# Patient Record
Sex: Female | Born: 1955 | Race: White | Hispanic: No | Smoking: Former smoker
Health system: Southern US, Community
[De-identification: ages and names within clinical notes are randomized; demographics above are authoritative.]

## PROBLEM LIST (undated history)

## (undated) DIAGNOSIS — K219 Gastro-esophageal reflux disease without esophagitis: Secondary | ICD-10-CM

## (undated) DIAGNOSIS — I10 Essential (primary) hypertension: Secondary | ICD-10-CM

## (undated) DIAGNOSIS — I2510 Atherosclerotic heart disease of native coronary artery without angina pectoris: Principal | ICD-10-CM

## (undated) DIAGNOSIS — G4700 Insomnia, unspecified: Secondary | ICD-10-CM

## (undated) DIAGNOSIS — E785 Hyperlipidemia, unspecified: Secondary | ICD-10-CM

## (undated) DIAGNOSIS — I252 Old myocardial infarction: Secondary | ICD-10-CM

## (undated) DIAGNOSIS — Z9861 Coronary angioplasty status: Principal | ICD-10-CM

## (undated) HISTORY — DX: Hyperlipidemia, unspecified: E78.5

## (undated) HISTORY — DX: Essential (primary) hypertension: I10

## (undated) HISTORY — DX: Coronary angioplasty status: Z98.61

## (undated) HISTORY — DX: Gastro-esophageal reflux disease without esophagitis: K21.9

## (undated) HISTORY — DX: Insomnia, unspecified: G47.00

## (undated) HISTORY — DX: Old myocardial infarction: I25.2

## (undated) HISTORY — DX: Atherosclerotic heart disease of native coronary artery without angina pectoris: I25.10

---
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
## 2000-07-30 ENCOUNTER — Emergency Department (HOSPITAL_COMMUNITY): Admission: EM | Admit: 2000-07-30 | Discharge: 2000-07-31 | Payer: Self-pay | Admitting: Emergency Medicine

## 2003-09-03 DIAGNOSIS — I2510 Atherosclerotic heart disease of native coronary artery without angina pectoris: Secondary | ICD-10-CM

## 2003-09-03 HISTORY — DX: Atherosclerotic heart disease of native coronary artery without angina pectoris: I25.10

## 2003-09-03 HISTORY — PX: CORONARY ANGIOPLASTY WITH STENT PLACEMENT: SHX49

## 2004-07-28 ENCOUNTER — Inpatient Hospital Stay (HOSPITAL_COMMUNITY): Admission: EM | Admit: 2004-07-28 | Discharge: 2004-08-01 | Payer: Self-pay | Admitting: Emergency Medicine

## 2004-08-14 DIAGNOSIS — I252 Old myocardial infarction: Secondary | ICD-10-CM

## 2004-08-14 HISTORY — DX: Old myocardial infarction: I25.2

## 2012-09-25 ENCOUNTER — Other Ambulatory Visit (HOSPITAL_COMMUNITY): Payer: Self-pay | Admitting: Cardiology

## 2012-09-25 DIAGNOSIS — I10 Essential (primary) hypertension: Secondary | ICD-10-CM

## 2012-09-25 DIAGNOSIS — E782 Mixed hyperlipidemia: Secondary | ICD-10-CM

## 2012-09-25 DIAGNOSIS — I2510 Atherosclerotic heart disease of native coronary artery without angina pectoris: Secondary | ICD-10-CM

## 2012-09-25 DIAGNOSIS — I252 Old myocardial infarction: Secondary | ICD-10-CM

## 2012-10-03 HISTORY — PX: NM MYOVIEW LTD: HXRAD82

## 2012-10-06 ENCOUNTER — Ambulatory Visit (HOSPITAL_COMMUNITY)
Admission: RE | Admit: 2012-10-06 | Discharge: 2012-10-06 | Disposition: A | Discharge status: Home / Self Care | Payer: 59 | Source: Ambulatory Visit | Attending: Cardiology | Admitting: Cardiology

## 2012-10-06 DIAGNOSIS — I252 Old myocardial infarction: Secondary | ICD-10-CM

## 2012-10-06 DIAGNOSIS — I2510 Atherosclerotic heart disease of native coronary artery without angina pectoris: Secondary | ICD-10-CM

## 2012-10-06 DIAGNOSIS — E782 Mixed hyperlipidemia: Secondary | ICD-10-CM

## 2012-10-06 DIAGNOSIS — I10 Essential (primary) hypertension: Secondary | ICD-10-CM

## 2012-10-06 MED ORDER — TECHNETIUM TC 99M SESTAMIBI GENERIC - CARDIOLITE
10.4000 | Freq: Once | INTRAVENOUS | Status: AC | PRN
  Administered 2012-10-06: 10 via INTRAVENOUS

## 2012-10-06 MED ORDER — TECHNETIUM TC 99M SESTAMIBI GENERIC - CARDIOLITE
30.1000 | Freq: Once | INTRAVENOUS | Status: AC | PRN
  Administered 2012-10-06: 30.1 via INTRAVENOUS

## 2012-10-06 NOTE — Procedures (Addendum)
[facility] [address] [place] [address] [place] [address] [phone]  Cardiology Nuclear Med Study  Eileen Burgess is a 57 y.o. female     MRN : [id number]     DOB: Dec 04, 1955  Procedure Date: 10/06/2012  Nuclear Med Background Indication for Stress Test:  Stent Patency History:  MI 2005, CAD Cardiac Risk Factors: Family History - CAD, History of Smoking, Hypertension, Lipids and Obesity  Symptoms:  none   Nuclear Pre-Procedure Caffeine/Decaff Intake:  10:00pm NPO After: 8:30am   IV Site: R Antecubital  IV 0.9% NS with Angio Cath:  22g  Chest Size (in):  n/a IV Started by: Koren Shiver, CNMT  Height: 5\' 3"  (1.6 m)  Cup Size: C  BMI:  Body mass index is 35.07 kg/(m^2). Weight:  198 lb (89.812 kg)   Tech Comments:  Patient took Psychologist, clinical Med Study 1 or 2 day study: 1 day  Stress Test Type:  Stress  Order Authorizing Provider:  Bryan Lemma, MD   Resting Radionuclide: Technetium 30m Sestamibi  Resting Radionuclide Dose: 10.4 mCi   Stress Radionuclide:  Technetium 77m Sestamibi  Stress Radionuclide Dose: 30.1 mCi           Stress Protocol Rest HR: 60 Stress HR:139  Rest BP:125/88 Stress BP: 161/81  Exercise Time (min): 8:50 METS: 9.00          Dose of Adenosine (mg):  n/a Dose of Lexiscan: n/a mg  Dose of Atropine (mg): n/a Dose of Dobutamine: n/a mcg/kg/min (at max HR)  Stress Test Technologist: Ernestene Mention, CCT Nuclear Technologist: Gonzella Lex, CNMT   Rest Procedure:  Myocardial perfusion imaging was performed at rest 45 minutes following the intravenous administration of Technetium 40m Sestamibi. Stress Procedure:  The patient performed treadmill exercise using a Bruce  Protocol for 8 minutes and 50 seconds.. The patient stopped due to fatigue and shortness of breath. Patient denied any chest pain.  There were no significant ST-T wave changes.  Technetium 87m Sestamibi was injected at peak exercise and  myocardial perfusion imaging was performed after a brief delay.  Transient Ischemic Dilatation (Normal <1.22):  0.96 Lung/Heart Ratio (Normal <0.45):  0.35 QGS EDV:  67 ml QGS ESV:  22 ml LV Ejection Fraction: 68%  Signed by      Rest ECG: NSR - Normal EKG  Stress ECG: No significant change from baseline ECG  QPS Raw Data Images:  Normal; no motion artifact; normal heart/lung ratio. Stress Images:  Normal homogeneous uptake in all areas of the myocardium. Rest Images:  Normal homogeneous uptake with mild inferoseptal diaphragmatic attenuation. Subtraction (SDS):  Normal  Impression Exercise Capacity:  Good exercise capacity. BP Response:  Normal blood pressure response. Clinical Symptoms:  No significant symptoms noted. ECG Impression:  No significant ST segment change suggestive of ischemia. Comparison with Prior Nuclear Study: No significant change from previous study  Overall Impression:  Normal stress nuclear study. Low risk stress nuclear study.  LV Wall Motion:  NL LV Function, EF 68%; NL Wall Motion   Nicki Guadalajara A, MD  10/06/2012 3:21 PM

## 2013-03-23 ENCOUNTER — Encounter: Payer: Self-pay | Admitting: Cardiology

## 2013-03-23 ENCOUNTER — Ambulatory Visit (INDEPENDENT_AMBULATORY_CARE_PROVIDER_SITE_OTHER): Payer: BC Managed Care – PPO | Admitting: Cardiology

## 2013-03-23 VITALS — BP 138/98 | HR 66 | Ht 64.0 in | Wt 204.9 lb

## 2013-03-23 DIAGNOSIS — E669 Obesity, unspecified: Secondary | ICD-10-CM

## 2013-03-23 DIAGNOSIS — E785 Hyperlipidemia, unspecified: Secondary | ICD-10-CM | POA: Insufficient documentation

## 2013-03-23 DIAGNOSIS — Z9861 Coronary angioplasty status: Secondary | ICD-10-CM

## 2013-03-23 DIAGNOSIS — I10 Essential (primary) hypertension: Secondary | ICD-10-CM

## 2013-03-23 DIAGNOSIS — I2510 Atherosclerotic heart disease of native coronary artery without angina pectoris: Secondary | ICD-10-CM | POA: Insufficient documentation

## 2013-03-23 MED ORDER — HYDROCHLOROTHIAZIDE 12.5 MG PO CAPS: 12.5000 mg | ORAL_CAPSULE | Freq: Every day | ORAL | Status: DC

## 2013-03-23 NOTE — Patient Instructions (Addendum)
Don't get discouraged about the weight loss.  You will do fine.  Just keep up the efforts with exercise & watching your diet.  Your BP has started to creep up of late -- I am going to add a new medicine to take along with your Lisinopril called HCTZ.  I will be looking out for your cholesterol levels in a few months.  I would like for you to follow up here with one of my PAs or NP to check your blood pressure & follow up your weight loss efforts. I will see you in 1 yr.  Marykay Lex, MD  6 month

## 2013-03-23 NOTE — Progress Notes (Signed)
Patient ID: Eileen Burgess, female   DOB: Feb 13, 1956, 57 y.o.   MRN: [id number] PCP: No primary provider on file. [facility]  Clinic Note: Chief Complaint  Patient presents with  . 6 month visit    no chest pain ,no sob, no edema   HPI: Eileen Burgess is a 57 y.o. female with a PMH below who presents today for followup of her coronary disease and other cardiac risk factors. I urged saw her back in January of 2014, she been a long-term patient of Dr. Caprice Kluver with a history of the coronary disease and PCI to the LAD with a DES back in 2005.  Following for PCI, she only had a stress test in 2008. This is a Treadmill Myoview Stress Test, and she only walked 3 minutes and 40 seconds. There was no evidence of ischemia infarction, but that was only 5 metabolic equivalents. Since my last visit, she has had a repeat Treadmill Myoview Stress Test, and this time she performed much better almost making it to 9 minutes, and reaching 9 metabolic equivalents. She is quite happy with her performance in that affected that was again negative for ischemia or infarction. My last chart she had lost 8 pounds due to dietary modification and is opening up and exercise.  Interval History: She comes in today and is really not feeling any more short of breath at rest or exertion. Unfortunately she has put back on about 6 of the 8 pounds that she lost since her last visit despite being very active - she thinks she may have gotten a little lax on her diet. She denies any chest pain at rest or exertion. No PND, orthopnea or edema. No melena, hematochezia or hematuria. No TIA or amaurosis fugax symptoms. No lightheadedness, dizziness, wooziness, syncope/near syncope. No claudication symptoms.  Past Medical History  Diagnosis Date  . CAD S/P percutaneous coronary angioplasty 2005    PCI to pLAD; 3.0 mm x 38 mm Cypher DES; EF ~45% - distal, septal apical hypokinesia  . HTN (hypertension)   . Dyslipidemia, goal LDL  below 70   . GERD (gastroesophageal reflux disease)   . Insomnia    Prior Cardiac Evaluation and Past Surgical History: Past Surgical History  Procedure Laterality Date  . Coronary angioplasty with stent placement  2005    prox LAD 3.0 mm x 28 mm Cypher DES  . Nm myoview ltd  10/2012    8:50 min; 9 METS.  No ischemia or Infarction; EF ~68%    Allergies  Allergen Reactions  . Pravastatin Other (See Comments)    myalagia  . Shrimp (Shellfish Allergy)   . Vytorin (Ezetimibe-Simvastatin) Other (See Comments)    myalgia    Current Outpatient Prescriptions  Medication Sig Dispense Refill  . ALPRAZolam (NIRAVAM) 0.25 MG dissolvable tablet Take 0.25 mg by mouth at bedtime as needed for anxiety.      Marland Kitchen aspirin EC 81 MG tablet Take 81 mg by mouth daily.      Marland Kitchen lisinopril (PRINIVIL,ZESTRIL) 20 MG tablet Take 20 mg by mouth daily.      . Melatonin 3 MG TABS Take by mouth at bedtime.      . metoprolol succinate (TOPROL-XL) 25 MG 24 hr tablet Take 25 mg by mouth daily.      Marland Kitchen omeprazole (PRILOSEC) 20 MG capsule Take 20 mg by mouth daily.      . rosuvastatin (CRESTOR) 5 MG tablet Take 5 mg by mouth every other  day.      . hydrochlorothiazide (MICROZIDE) 12.5 MG capsule Take 1 capsule (12.5 mg total) by mouth daily.  90 capsule  4   No current facility-administered medications for this visit.  HCTZ Added @ this visit.  Social History Narrative   Widow.  Works for [employer].   Has been quite active, but no routine exercise.   Quit Smoking in 2005 before her MI.  Does not drink alcohol.   ROS: A comprehensive Review of Systems - Negative except inability to keep off her weight  PHYSICAL EXAM BP 138/98  Pulse 66  Ht 5\' 4"  (1.626 m)  Wt 204 lb 14.4 oz (92.942 kg)  BMI 35.15 kg/m2 General appearance: alert, cooperative, appears stated age, no distress and moderately obese Neck: no adenopathy, no carotid bruit, no JVD and supple, symmetrical, trachea midline Lungs: clear  to auscultation bilaterally, normal percussion bilaterally and Nonlabored, good air movement Heart: regular rate and rhythm, S1, S2 normal, no murmur, click, rub or gallop and normal apical impulse Abdomen: soft, non-tender; bowel sounds normal; no masses,  no organomegaly and Obese Extremities: extremities normal, atraumatic, no cyanosis or edema, no edema, redness or tenderness in the calves or thighs and no ulcers, gangrene or trophic changes Pulses: 2+ and symmetric Neurologic: Grossly normal  WUJ:[id number] today: Yes Rate: 66 , Rhythm:  Normal sinus rhythm, nonspecific ST-T changes  Recent Labs: none at present  ASSESSMENT: Overall stable cardiac standpoint. Continues to have difficulty with weight loss efforts. Blood pressure remains elevated with a diastolic pressure of 98 mmHg.  CAD S/P percutaneous coronary angioplasty Stable with no active symptoms. Recent stress test was negative for ischemia infarction. EF is back up to normal with no regional wall motion abnormalities noted on nuclear stress test.  Plan: Continue aspirin, current dose of beta blocker and ACE inhibitor as well as statin.  HTN (hypertension) Inadequately controlled diastolic pressure.  Plan: Add HCTZ 12.5 mg daily.-- Would titrate up to 25 mg if need be.  Dyslipidemia, goal LDL below 70 Currently taking Crestor 5 mg every other day. She is due to get lipids checked by her primary physician shortly.  If we don't have lab results at her next visit with the PA/NP, would check lipids and chemistry panel.  Obesity (BMI 30-39.9) We talked a lot about dietary modifications cutting back on carbohydrates and fatty foods. This in conjunction with continuing her working on exercise is the best option. For this reason I want to check back up with her in 6 months to look for blood pressure and to see if she needs any further guidance for weight loss. Her goal should be to lose 20 pounds in the next year, and if she does so  she'll be no longer obese.   PLAN: Per problem list. Orders Placed This Encounter  Procedures  . EKG 12-Lead   Meds ordered this encounter  Medications  . hydrochlorothiazide (MICROZIDE) 12.5 MG capsule    Sig: Take 1 capsule (12.5 mg total) by mouth daily.    Dispense:  90 capsule    Refill:  4    Followup:  6 months with NP/PA; 12 months with me  DAVID W. Herbie Baltimore, M.D., M.S. [facility] [address] [place], [place]  [address]  [phone] Pager # [phone]

## 2013-04-02 ENCOUNTER — Other Ambulatory Visit: Payer: Self-pay | Admitting: *Deleted

## 2013-04-03 ENCOUNTER — Encounter: Payer: Self-pay | Admitting: Cardiology

## 2013-04-03 DIAGNOSIS — E669 Obesity, unspecified: Secondary | ICD-10-CM | POA: Insufficient documentation

## 2013-04-03 NOTE — Assessment & Plan Note (Signed)
We talked a lot about dietary modifications cutting back on carbohydrates and fatty foods. This in conjunction with continuing her working on exercise is the best option. For this reason I want to check back up with her in 6 months to look for blood pressure and to see if she needs any further guidance for weight loss. Her goal should be to lose 20 pounds in the next year, and if she does so she'll be no longer obese.

## 2013-04-03 NOTE — Assessment & Plan Note (Addendum)
Inadequately controlled diastolic pressure.  Plan: Add HCTZ 12.5 mg daily.-- Would titrate up to 25 mg if need be.

## 2013-04-03 NOTE — Assessment & Plan Note (Signed)
Currently taking Crestor 5 mg every other day. She is due to get lipids checked by her primary physician shortly.  If we don't have lab results at her next visit with the PA/NP, would check lipids and chemistry panel.

## 2013-04-03 NOTE — Assessment & Plan Note (Signed)
Stable with no active symptoms. Recent stress test was negative for ischemia infarction. EF is back up to normal with no regional wall motion abnormalities noted on nuclear stress test.  Plan: Continue aspirin, current dose of beta blocker and ACE inhibitor as well as statin.

## 2013-04-06 ENCOUNTER — Other Ambulatory Visit: Payer: Self-pay | Admitting: *Deleted

## 2013-04-06 MED ORDER — ALPRAZOLAM 0.25 MG PO TBDP: 0.2500 mg | ORAL_TABLET | Freq: Every evening | ORAL | Status: DC | PRN

## 2013-04-06 NOTE — Telephone Encounter (Signed)
Xanax .25 mg    1 tab at bedtime or as directed  #60x3

## 2013-10-07 ENCOUNTER — Other Ambulatory Visit: Payer: Self-pay | Admitting: *Deleted

## 2013-10-07 ENCOUNTER — Other Ambulatory Visit: Payer: Self-pay | Admitting: Cardiology

## 2013-10-07 NOTE — Telephone Encounter (Signed)
Rx was sent to pharmacy electronically. 

## 2013-10-08 ENCOUNTER — Other Ambulatory Visit: Payer: Self-pay | Admitting: Cardiology

## 2013-10-11 NOTE — Telephone Encounter (Signed)
Rx was sent to pharmacy electronically. 

## 2014-01-06 ENCOUNTER — Telehealth: Payer: Self-pay | Admitting: Cardiology

## 2014-01-07 NOTE — Telephone Encounter (Signed)
Closed encounter °

## 2014-01-10 ENCOUNTER — Other Ambulatory Visit: Payer: Self-pay | Admitting: Cardiology

## 2014-01-11 NOTE — Telephone Encounter (Signed)
This is not a Rx that I can call in refills on without clinic visit.  ' Also - is best to defer this type of medication -- for long term care to the PCP.  [name], MD

## 2014-01-12 NOTE — Telephone Encounter (Signed)
Patient hasn't been seen since July 2014. Refill request deferred to patient's PCP.

## 2014-02-01 NOTE — Telephone Encounter (Signed)
That's fine.  His PCP can do it, but for a while (between PCPs) I did.  [name]

## 2014-02-09 ENCOUNTER — Encounter: Payer: Self-pay | Admitting: *Deleted

## 2014-02-10 ENCOUNTER — Encounter: Payer: Self-pay | Admitting: Cardiology

## 2014-02-15 ENCOUNTER — Encounter: Payer: Self-pay | Admitting: Cardiology

## 2014-02-15 ENCOUNTER — Ambulatory Visit (INDEPENDENT_AMBULATORY_CARE_PROVIDER_SITE_OTHER): Payer: BC Managed Care – PPO | Admitting: Cardiology

## 2014-02-15 VITALS — BP 110/72 | HR 63 | Ht 64.0 in | Wt 208.2 lb

## 2014-02-15 DIAGNOSIS — G4700 Insomnia, unspecified: Secondary | ICD-10-CM

## 2014-02-15 DIAGNOSIS — F411 Generalized anxiety disorder: Secondary | ICD-10-CM

## 2014-02-15 DIAGNOSIS — Z79899 Other long term (current) drug therapy: Secondary | ICD-10-CM

## 2014-02-15 DIAGNOSIS — Z789 Other specified health status: Secondary | ICD-10-CM

## 2014-02-15 DIAGNOSIS — I2510 Atherosclerotic heart disease of native coronary artery without angina pectoris: Secondary | ICD-10-CM

## 2014-02-15 DIAGNOSIS — I10 Essential (primary) hypertension: Secondary | ICD-10-CM

## 2014-02-15 DIAGNOSIS — E785 Hyperlipidemia, unspecified: Secondary | ICD-10-CM

## 2014-02-15 DIAGNOSIS — F419 Anxiety disorder, unspecified: Secondary | ICD-10-CM

## 2014-02-15 DIAGNOSIS — E669 Obesity, unspecified: Secondary | ICD-10-CM

## 2014-02-15 DIAGNOSIS — Z9861 Coronary angioplasty status: Secondary | ICD-10-CM

## 2014-02-15 MED ORDER — NITROGLYCERIN 0.4 MG SL SUBL: 0.4000 mg | SUBLINGUAL_TABLET | SUBLINGUAL | Status: DC | PRN

## 2014-02-15 MED ORDER — EZETIMIBE 10 MG PO TABS: 10.0000 mg | ORAL_TABLET | Freq: Every day | ORAL | Status: DC

## 2014-02-15 MED ORDER — ALPRAZOLAM 0.25 MG PO TABS: ORAL_TABLET | ORAL | Status: DC

## 2014-02-15 NOTE — Patient Instructions (Addendum)
LABS-LIPIDS, CMP  START Zetia 10 mg daily  Your physician wants you to follow-up in 6 months Dr Ellyn Hack.  You will receive a reminder letter in the mail two months in advance. If you don't receive a letter, please call our office to schedule the follow-up appointment.

## 2014-02-19 ENCOUNTER — Encounter: Payer: Self-pay | Admitting: Cardiology

## 2014-02-19 DIAGNOSIS — F419 Anxiety disorder, unspecified: Secondary | ICD-10-CM | POA: Insufficient documentation

## 2014-02-19 DIAGNOSIS — Z789 Other specified health status: Secondary | ICD-10-CM | POA: Insufficient documentation

## 2014-02-19 DIAGNOSIS — I10 Essential (primary) hypertension: Secondary | ICD-10-CM | POA: Insufficient documentation

## 2014-02-19 NOTE — Assessment & Plan Note (Signed)
Unfortunately, she did not take Crestor. We are now limited to using Zetia. Recheck lipids& CMP in ~ 3 months.

## 2014-02-19 NOTE — Assessment & Plan Note (Signed)
Well-controlled on her current regimen. 

## 2014-02-19 NOTE — Progress Notes (Signed)
Patient ID: Eileen Burgess, female   DOB: 1955/12/20, 58 y.o.   MRN: [id number] PCP: No PCP Per [facility]  Clinic Note: Chief Complaint  Patient presents with  . Annual Exam    NO CHEST PAIN , NO SOB, NO EDEMA   HPI: Eileen Burgess is a 58 y.o. female with a PMH below who presents today for followup of her coronary disease and other cardiac risk factors. I last saw her back in Aug 2014 as a 2nd visit since Dr. Eddie Dibbles retirement.   She has a PMH of CAD- PCI to LAD with DES in 2005 -- f/u TM Myoview in 10/2012 was negative for ischemia or infarction (9 Min ->9 METs), 68% EF  She was unable to tolerate Crestor, even with 1/wk dosing due to legs hurting.    Interval History:  She presents today with no major complaints.  She denies and anginal CP/pressure or dyspnea with rest or exertion.  She continues to battle with her weight -- having put on ~4 lb since last year -- essentially now up 2 pounds from her lowest weight in ~2 years.. No really very active -- no routine exercise.    No PND, orthopnea or edema. No melena, hematochezia or hematuria. No TIA or amaurosis fugax symptoms. No lightheadedness, dizziness, wooziness, syncope/near syncope. No claudication symptoms.No known telemetry unit he did have hematuria, epistaxis.  She says that even off of the statin, she has nighttime resting leg discomfort  Past Medical History  Diagnosis Date  . CAD S/P percutaneous coronary angioplasty 2005    PCI to pLAD; 3.0 mm x 38 mm Cypher DES; EF ~45% - distal, septal apical hypokinesia  . HTN (hypertension)   . Dyslipidemia, goal LDL below 70   . GERD (gastroesophageal reflux disease)   . Insomnia    Prior Cardiac Evaluation and Past Surgical History: Procedure Laterality Date  . Coronary angioplasty with stent placement  2005    prox LAD 3.0 mm x 28 mm Cypher DES  . Nm myoview ltd  10/2012    8:50 min; 9 METS.  No ischemia or Infarction; EF ~68%    Allergies  Allergen  Reactions  . Crestor [Rosuvastatin] Other (See Comments)    LEGS HURTING  . Pravastatin Other (See Comments)    myalagia  . Shrimp [Shellfish Allergy]   . Vytorin [Ezetimibe-Simvastatin] Other (See Comments)    myalgia    Current Outpatient Prescriptions  Medication Sig Dispense Refill  . aspirin EC 81 MG tablet Take 81 mg by mouth daily.      . hydrochlorothiazide (MICROZIDE) 12.5 MG capsule Take 1 capsule (12.5 mg total) by mouth daily.  90 capsule  4  . lisinopril (PRINIVIL,ZESTRIL) 20 MG tablet TAKE 1 TABLET BY MOUTH DAILY  90 tablet  1  . Melatonin 3 MG TABS Take by mouth at bedtime.      . metoprolol succinate (TOPROL-XL) 25 MG 24 hr tablet TAKE 1 TABLET BY MOUTH DAILY  30 tablet  5  . omeprazole (PRILOSEC) 20 MG capsule Take 20 mg by mouth daily.      Marland Kitchen PREVIDENT 5000 ENAMEL PROTECT 1.1-5 % PSTE       . ALPRAZolam (XANAX) 0.25 MG tablet TAKE 1 TO 2  TABLET DAILY AS NEEDED FOR ANXIETY AND, SLEEP  60 tablet  2  . nitroGLYCERIN (NITROSTAT) 0.4 MG SL tablet Place 1 tablet (0.4 mg total) under the tongue every 5 (five) minutes as needed for  chest pain.  25 tablet  3   No current facility-administered medications for this visit.  HCTZ Added @ this visit.  Social History Narrative   Widow.  Works for [employer].   Has been quite active, but no routine exercise.   Quit Smoking in 2005 before her MI.  Does not drink alcohol.   ROS: A comprehensive Review of Systems - Negative except inability to keep off her weight; still has intermittent anxiety & insomnia.  Still has 1-2 of the alprazolam tablets prescribed (with intent of ~1-2 months supply) which would suggest her psychological function is stable.  PHYSICAL EXAM BP 110/72  Pulse 63  Ht 5\' 4"  (1.626 m)  Wt 208 lb 3.2 oz (94.439 kg)  BMI 35.72 kg/m2 General appearance: alert, cooperative, appears stated age, no distress and moderately obese Neck: no adenopathy, no carotid bruit, no JVD and supple, symmetrical,  trachea midline Lungs: clear to auscultation bilaterally, normal percussion bilaterally and Nonlabored, good air movement Heart: regular rate and rhythm, S1, S2 normal, no murmur, click, rub or gallop and normal apical impulse Abdomen: soft, non-tender; bowel sounds normal; no masses,  no organomegaly and Obese Extremities: extremities normal, atraumatic, no cyanosis or edema, no edema, redness or tenderness in the calves or thighs and no ulcers, gangrene or trophic changes Pulses: 2+ and symmetric Neurologic: Grossly normal  [id number] today: Yes Rate: 63 , Rhythm:  Normal sinus rhythm, Sinus Arrythmia; nonspecific ST-T changes  Recent Labs: none at present  ASSESSMENT: Overall stable cardiac standpoint. Continues to have difficulty with weight loss efforts. Blood pressure more stable  Due for lipid evaluation.   CAD S/P percutaneous coronary angioplasty Stable with no active symptoms. Remains on beta blocker, ACE inhibitor and aspirin. No longer on Plavix. She is not on statin due to the statin intolerance.  HTN (hypertension) Well-controlled on her current regimen  Dyslipidemia, goal LDL below 70 Unfortunately, she did not take Crestor. We are now limited to using Zetia. Recheck lipids& CMP in ~ 3 months.  Statin intolerance Will have to use Zetia for now, perhaps we can try the new PCSK 9 inhibitor medications when they finally are on the market.  Obesity (BMI 30-39.9) This is really her Achilles heel. He needs it the stretching exercises and walking in a day to burn calories he is in change her diet. I suggested that she could possibly see a nutritionist, because she appears not able to do it on her own. I think if she is notable to lose weight between myself and her PCP, she may need a nutrition consult also minus exercise rehabilitation program  Anxiety - very rare "panick attacks" Very rare panic attacks, the prescription of alprazolam I gave her last year he still not  complete. Again finally given her a lot of prescription for Xanax and draped in a year. Did recommend she set a visit with a new primary care physician since she currently does not have a PCP.   PLAN: Per problem list. Orders Placed This Encounter  Procedures  . Lipid panel    Order Specific Question:  Has the patient fasted?    Answer:  Yes  . Comprehensive metabolic panel    Order Specific Question:  Has the patient fasted?    Answer:  Yes  . EKG 12-Lead   Meds ordered this encounter    Followup:  6 with me  DAVID W. Ellyn Hack, M.D., M.S. [facility] [address]  [place], [place]  [address]  [phone] Pager # [phone]

## 2014-02-19 NOTE — Assessment & Plan Note (Signed)
This is really her Achilles heel. He needs it the stretching exercises and walking in a day to burn calories he is in change her diet. I suggested that she could possibly see a nutritionist, because she appears not able to do it on her own. I think if she is notable to lose weight between myself and her PCP, she may need a nutrition consult also minus exercise rehabilitation program

## 2014-02-19 NOTE — Assessment & Plan Note (Signed)
Stable with no active symptoms. Remains on beta blocker, ACE inhibitor and aspirin. No longer on Plavix. She is not on statin due to the statin intolerance.

## 2014-02-19 NOTE — Assessment & Plan Note (Signed)
Very rare panic attacks, the prescription of alprazolam I gave her last year he still not complete. Again finally given her a lot of prescription for Xanax and draped in a year. Did recommend she set a visit with a new primary care physician since she currently does not have a PCP.

## 2014-02-19 NOTE — Assessment & Plan Note (Signed)
Will have to use Zetia for now, perhaps we can try the new PCSK 9 inhibitor medications when they finally are on the market.

## 2014-03-24 ENCOUNTER — Other Ambulatory Visit: Payer: Self-pay | Admitting: Cardiology

## 2014-03-24 NOTE — Telephone Encounter (Signed)
Rx was sent to pharmacy electronically. 

## 2014-04-06 ENCOUNTER — Other Ambulatory Visit: Payer: Self-pay | Admitting: Cardiology

## 2014-04-06 NOTE — Telephone Encounter (Signed)
Rx was sent to pharmacy electronically. 

## 2014-04-12 ENCOUNTER — Other Ambulatory Visit: Payer: Self-pay | Admitting: Cardiology

## 2014-04-12 NOTE — Telephone Encounter (Signed)
Rx was sent to pharmacy electronically. 

## 2014-05-26 ENCOUNTER — Telehealth: Payer: Self-pay | Admitting: *Deleted

## 2014-05-26 NOTE — Telephone Encounter (Signed)
Vaccine administration record for the influenza received  RN placed information in immunization history chart

## 2014-09-05 ENCOUNTER — Other Ambulatory Visit: Payer: Self-pay | Admitting: Cardiology

## 2014-09-06 NOTE — Telephone Encounter (Signed)
Unfortunately - this Rx needs a clinic visit - controlled substance.  I was hoping that a PCP would take over this Rx.   Can see me or APP, but cannot fill without having a clinic visit.  [name], MD

## 2014-09-08 ENCOUNTER — Telehealth: Payer: Self-pay | Admitting: Cardiology

## 2014-09-08 DIAGNOSIS — G4700 Insomnia, unspecified: Secondary | ICD-10-CM

## 2014-09-08 DIAGNOSIS — F419 Anxiety disorder, unspecified: Secondary | ICD-10-CM

## 2014-09-08 MED ORDER — ALPRAZOLAM 0.25 MG PO TABS: ORAL_TABLET | ORAL | Status: DC

## 2014-09-08 NOTE — Telephone Encounter (Signed)
Xanax refill refused with "instructions to pharmacy" that patient needs office visit for refills

## 2014-09-08 NOTE — Telephone Encounter (Signed)
Mrs.Troxler is calling because her prescription Alprazolam 0.25 was denied because she needed to schedule an appointment . She has an appointment for 10/12/14. Please send to [place] .Marland Kitchen Any questions please call . Thanks

## 2014-09-08 NOTE — Telephone Encounter (Signed)
Med refilled, called to pharmacy.

## 2014-09-08 NOTE — Addendum Note (Signed)
Addended by: Theodore Demark on: 09/08/2014 02:49 PM   Modules accepted: Orders

## 2014-10-12 ENCOUNTER — Encounter: Payer: Self-pay | Admitting: Cardiology

## 2014-10-12 ENCOUNTER — Ambulatory Visit (INDEPENDENT_AMBULATORY_CARE_PROVIDER_SITE_OTHER): Payer: 59 | Admitting: Cardiology

## 2014-10-12 VITALS — BP 122/70 | HR 51 | Ht 64.0 in | Wt 207.7 lb

## 2014-10-12 DIAGNOSIS — I252 Old myocardial infarction: Secondary | ICD-10-CM

## 2014-10-12 DIAGNOSIS — I2510 Atherosclerotic heart disease of native coronary artery without angina pectoris: Secondary | ICD-10-CM

## 2014-10-12 DIAGNOSIS — Z789 Other specified health status: Secondary | ICD-10-CM

## 2014-10-12 DIAGNOSIS — E669 Obesity, unspecified: Secondary | ICD-10-CM

## 2014-10-12 DIAGNOSIS — Z79899 Other long term (current) drug therapy: Secondary | ICD-10-CM

## 2014-10-12 DIAGNOSIS — E785 Hyperlipidemia, unspecified: Secondary | ICD-10-CM

## 2014-10-12 DIAGNOSIS — Z9861 Coronary angioplasty status: Secondary | ICD-10-CM

## 2014-10-12 DIAGNOSIS — F419 Anxiety disorder, unspecified: Secondary | ICD-10-CM

## 2014-10-12 DIAGNOSIS — G4700 Insomnia, unspecified: Secondary | ICD-10-CM

## 2014-10-12 DIAGNOSIS — I10 Essential (primary) hypertension: Secondary | ICD-10-CM

## 2014-10-12 DIAGNOSIS — Z889 Allergy status to unspecified drugs, medicaments and biological substances status: Secondary | ICD-10-CM

## 2014-10-12 LAB — LIPID PANEL
Cholesterol: 232 mg/dL — ABNORMAL HIGH (ref 0–200)
HDL: 37 mg/dL — ABNORMAL LOW (ref >39)
LDL CALC: 173 mg/dL — AB (ref 0–99)
Total CHOL/HDL Ratio: 6.3 Ratio
Triglycerides: 111 mg/dL (ref <150)
VLDL: 22 mg/dL (ref 0–40)

## 2014-10-12 LAB — HEPATIC FUNCTION PANEL
ALT: 8 U/L (ref 0–35)
AST: 12 U/L (ref 0–37)
Albumin: 4 g/dL (ref 3.5–5.2)
Alkaline Phosphatase: 72 U/L (ref 39–117)
Bilirubin, Direct: 0.1 mg/dL (ref 0.0–0.3)
Indirect Bilirubin: 0.4 mg/dL (ref 0.2–1.2)
Total Bilirubin: 0.5 mg/dL (ref 0.2–1.2)
Total Protein: 7 g/dL (ref 6.0–8.3)

## 2014-10-12 MED ORDER — ALPRAZOLAM 0.25 MG PO TABS: ORAL_TABLET | ORAL | Status: DC

## 2014-10-12 MED ORDER — FENOFIBRATE 48 MG PO TABS: 48.0000 mg | ORAL_TABLET | Freq: Every day | ORAL | Status: DC

## 2014-10-12 MED ORDER — FENOFIBRATE 54 MG PO TABS: 54.0000 mg | ORAL_TABLET | Freq: Every day | ORAL | Status: DC

## 2014-10-12 NOTE — Patient Instructions (Addendum)
Dr Ellyn Hack has ordered the following test(s) to be done: 1. Blood work - to be done today   Your physician has recommended making the following medication changes: START Fenofibrate 48 mg - take 1 tablet. START Co-Q 10 100 mg - take 100 mg for 1 week then increase to 200 mg for 1 week then increase to 300 mg and remain at that dose.   Dr Ellyn Hack has referred you to Nehemiah Massed, PharmD in the lipid clinic.  Dr Ellyn Hack wants you to follow-up in 6 months. You will receive a reminder letter in the mail one months in advance. If you don't receive a letter, please call our office to schedule the follow-up appointment.

## 2014-10-14 ENCOUNTER — Encounter: Payer: Self-pay | Admitting: Cardiology

## 2014-10-14 NOTE — Assessment & Plan Note (Signed)
Refer for PCSK-9 Inhibitor.

## 2014-10-14 NOTE — Assessment & Plan Note (Signed)
Anterior STEMI in 2005.  No evidence of anterior infarct on Myoview with preserved EF.

## 2014-10-14 NOTE — Assessment & Plan Note (Signed)
The patient understands the need to lose weight with diet and exercise. We have discussed specific strategies for this. She needs to figure out a way to get her sone & Daughter-in-law to get with the program of healthy eating. Also needs to make time for exercise.

## 2014-10-14 NOTE — Progress Notes (Signed)
Patient ID: Eileen Burgess, female   DOB: 1956-01-06, 59 y.o.   MRN: [id number] PCP: No PCP Per Patient [facility]  Clinic Note: Chief Complaint  Patient presents with  . 6 MONTH VISIT  . Coronary Artery Disease    LAD PCI in 2005   HPI: Eileen Burgess is a 59 y.o. female with a PMH below who presents today for 6 month followup of her coronary disease and other cardiac risk factors.  Former Dr. Rex Kras Patient.  She was unable to tolerate Crestor, even with 1/wk dosing due to legs hurting.   She has not yet set up our care physician.  Interval History:  She presents today with no major complaints.  She denies and anginal CP/pressure or dyspnea with rest or exertion.  She continues to battle with her weight -- but at least remained somewhat stable from last year. He said that she really hasn't been following a strict diet she probably should be able to. Part of the reason is because she has to split the cost of food with her son and daughter-in-law who cannot have good eating habits. Overall, she continues to be active at work, but does not have any routine exercise.   She denies any particular symptoms of PND, orthopnea or edema. No melena, hematochezia or hematuria. No TIA or amaurosis fugax symptoms. No lightheadedness, dizziness, wooziness, syncope/near syncope. No claudication symptoms.No known telemetry unit he did have hematuria, epistaxis.  She says that even off of the statin, she has nighttime resting leg discomfort  Past Medical History  Diagnosis Date  . CAD S/P percutaneous coronary angioplasty 2005    PCI to pLAD 99%; 3.0 mm x 38 mm Cypher DES; EF ~45% - distal, septal apical hypokinesis    . 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 Dyslipidemia, goal LDL below 70   . Essential hypertension   . GERD (gastroesophageal reflux disease)   . Insomnia    Prior Cardiac Evaluation and Past Surgical History: Past Surgical History  Procedure Laterality Date    . Coronary angioplasty with stent placement  2005    prox LAD 3.0 mm x 28 mm Cypher DES; circumflex is nondominant with 2 large EOMs. Dominant RCA is a large PDA and posterolateral branch.  . Nm myoview ltd  10/2012    8:50 min; 9 METS.  No ischemia or Infarction; EF ~68%    Allergies  Allergen Reactions  . Crestor [Rosuvastatin] Other (See Comments)    LEGS HURTING  . Pravastatin Other (See Comments)    myalagia  . Shrimp [Shellfish Allergy]   . Vytorin [Ezetimibe-Simvastatin] Other (See Comments)    myalgia   Current Outpatient Prescriptions on File Prior to Visit  Medication Sig Dispense Refill  . aspirin EC 81 MG tablet Take 81 mg by mouth daily.    . hydrochlorothiazide (MICROZIDE) 12.5 MG capsule TAKE 1 CAPSULE BY MOUTH DAILY 90 capsule 3  . lisinopril (PRINIVIL,ZESTRIL) 20 MG tablet TAKE 1 TABLET BY MOUTH ONCE DAILY 90 tablet 3  . Melatonin 3 MG TABS Take by mouth at bedtime.    . metoprolol succinate (TOPROL-XL) 25 MG 24 hr tablet TAKE 1 TABLET BY MOUTH DAILY 30 tablet 9  . nitroGLYCERIN (NITROSTAT) 0.4 MG SL tablet Place 1 tablet (0.4 mg total) under the tongue every 5 (five) minutes as needed for chest pain. 25 tablet 3  . omeprazole (PRILOSEC) 20 MG capsule Take 20 mg by mouth daily.    Marland Kitchen  PREVIDENT 5000 ENAMEL PROTECT 1.1-5 % PSTE      No current facility-administered medications on file prior to visit.   Social History Narrative   Widow.  Works for [employer].   Has been quite active, but no routine exercise.   Quit Smoking in 2005 before her MI.  Does not drink alcohol.   ROS: A comprehensive Review of Systems - was performed Review of Systems  Constitutional: Negative for weight loss (Just can't seem to loose) and malaise/fatigue.  HENT: Positive for nosebleeds.   Respiratory: Negative for shortness of breath.   Cardiovascular: Negative for claudication.  Gastrointestinal: Positive for heartburn. Negative for blood in stool and melena.   Genitourinary: Negative for hematuria.  Musculoskeletal: Positive for myalgias (Better off of Crestor).  Neurological: Negative for dizziness.  Endo/Heme/Allergies: Does not bruise/bleed easily.  Psychiatric/Behavioral: The patient is nervous/anxious (Usually takes 1/2 to 1 of her PRN Alprozolam / day) and has insomnia (not too often - but sometimes stress "gets the best of her").   All other systems reviewed and are negative.  PHYSICAL EXAM BP 122/70 mmHg  Pulse 51  Ht 5\' 4"  (1.626 m)  Wt 207 lb 11.2 oz (94.212 kg)  BMI 35.63 kg/m2 General appearance: alert, cooperative, appears stated age, no distress and moderately obese Neck: no adenopathy, no carotid bruit, no JVD and supple, symmetrical, trachea midline Lungs: clear to auscultation bilaterally, normal percussion bilaterally and Nonlabored, good air movement Heart: regular rate and rhythm, S1, S2 normal, no murmur, click, rub or gallop and normal apical impulse Abdomen: soft, non-tender; bowel sounds normal; no masses,  no organomegaly and Obese Extremities: extremities normal, atraumatic, no cyanosis or edema, no edema, redness or tenderness in the calves or thighs and no ulcers, gangrene or trophic changes Pulses: 2+ and symmetric Neurologic: Grossly normal  [id number] today: Yes Rate: 101, Rhythm:  Sinus Tachycardia with 1AVB & PACs - in an almost Atrial Bigeminy pattern,; nonspecific ST-T changes & minimal LVH criteria; TWI in III is new as is Incomplete RBBB  Recent Labs: none at present  ASSESSMENT / PLAN: Overall stable cardiac standpoint. Continues to have difficulty with weight loss efforts. Blood pressure more stable  Due for lipid evaluation.    History of acute anterior wall MI Anterior STEMI in 2005.  No evidence of anterior infarct on Myoview with preserved EF.   CAD S/P percutaneous coronary angioplasty She has Cypher DES stent in her LAD. It is relatively large stent. She remains on aspirin, beta blocker  and ACE inhibitor. She is statin intolerant.  No active anginal symptoms. At just over 10 years for PCI, thicker Cypher stent is probably healed, however first generation DES stents have larger stent-platforms and need to be monitored closely.   Dyslipidemia, goal LDL below 70 Intolerant of Crestor. She apparently he tried Zetia in the past as well.  We are pretty much at the end of statin options besides a Livalo. Plan: Start Low-Dose Fenofibrate & CoQ 10.   Check labs today.   Refer to Tommy Medal, RPH-CCP for consideration of PCSK-9 Inhibitor Rx.   Essential hypertension Excellent control. Continue Current meds.   Obesity (BMI 30-39.9) The patient understands the need to lose weight with diet and exercise. We have discussed specific strategies for this. She needs to figure out a way to get her sone & Daughter-in-law to get with the program of healthy eating. Also needs to make time for exercise.   Statin intolerance Refer for PCSK-9 Inhibitor.  Anxiety - very rare "panick attacks" Rare panic attacks & insomnia -- since she still does not have a PCP - will refill Alprazolam.     Orders Placed This Encounter  Procedures  . Hepatic function panel  . Lipid panel  . EKG 12-Lead   Meds ordered this encounter    Followup:  6 with me  [name], M.D., M.S. Interventional Cardiologist   Pager # [phone]

## 2014-10-14 NOTE — Assessment & Plan Note (Signed)
Rare panic attacks & insomnia -- since she still does not have a PCP - will refill Alprazolam.

## 2014-10-14 NOTE — Assessment & Plan Note (Signed)
She has Cypher DES stent in her LAD. It is relatively large stent. She remains on aspirin, beta blocker and ACE inhibitor. She is statin intolerant.  No active anginal symptoms. At just over 10 years for PCI, thicker Cypher stent is probably healed, however first generation DES stents have larger stent-platforms and need to be monitored closely.

## 2014-10-14 NOTE — Assessment & Plan Note (Signed)
Intolerant of Crestor. She apparently he tried Zetia in the past as well.  We are pretty much at the end of statin options besides a Livalo. Plan: Start Low-Dose Fenofibrate & CoQ 10.   Check labs today.   Refer to Tommy Medal, RPH-CCP for consideration of PCSK-9 Inhibitor Rx.

## 2014-10-14 NOTE — Assessment & Plan Note (Signed)
Excellent control. Continue Current meds.

## 2014-10-19 ENCOUNTER — Telehealth: Payer: Self-pay | Admitting: *Deleted

## 2014-10-19 DIAGNOSIS — Z789 Other specified health status: Secondary | ICD-10-CM

## 2014-10-19 DIAGNOSIS — Z79899 Other long term (current) drug therapy: Secondary | ICD-10-CM

## 2014-10-19 DIAGNOSIS — E669 Obesity, unspecified: Secondary | ICD-10-CM

## 2014-10-19 DIAGNOSIS — E785 Hyperlipidemia, unspecified: Secondary | ICD-10-CM

## 2014-10-19 NOTE — Telephone Encounter (Signed)
-----   Message from [name], MD sent at 10/14/2014  5:32 AM EST ----- Cholesterol Panel is not at goal as expected. Goal Cholesterol is <<200, LDL goal < 70 => we have a long way to go.   Will be reassessing in 3 months. Will forward to Tommy Medal, RPH-CCP ( our pharmacist) to check into enrolling you for possible treatment with the newest treatment option for cholesterol levels.  [name], MD

## 2014-10-19 NOTE — Telephone Encounter (Signed)
LEFT MESSAGE FOR PATIENT TO CALL BACK LABS PLACED IN COMPUTER FOR 3 MONTHS ROUTED TO [place] -d

## 2014-10-20 NOTE — Telephone Encounter (Signed)
Spoke to patient. Result given . Verbalized understanding Patient states she has an appointment with Erasmo Downer set up already.

## 2014-10-20 NOTE — Telephone Encounter (Signed)
Returning your call. °

## 2014-10-21 ENCOUNTER — Ambulatory Visit: Payer: 59 | Admitting: Pharmacist Clinician (PhC)/ Clinical Pharmacy Specialist

## 2014-11-14 ENCOUNTER — Ambulatory Visit (INDEPENDENT_AMBULATORY_CARE_PROVIDER_SITE_OTHER): Payer: 59 | Admitting: Pharmacist Clinician (PhC)/ Clinical Pharmacy Specialist

## 2014-11-14 VITALS — Ht 64.0 in | Wt 211.0 lb

## 2014-11-14 DIAGNOSIS — E785 Hyperlipidemia, unspecified: Secondary | ICD-10-CM

## 2014-11-14 NOTE — Patient Instructions (Signed)
Try Zetia 5 mg (1/2 of 10 mg tablet) daily.  Call in 3-4 weeks and let us know how you are tolerating this.  Continue to work on dietary changes.    Try increasing your fiber by adding Metamucil Capsules 1-2 daily as you can tolerate.  Also try Cholestoff by Petra Kuba Made, 2 capsules daily  Repeat labs in mid-May

## 2014-11-15 ENCOUNTER — Encounter: Payer: Self-pay | Admitting: Pharmacist Clinician (PhC)/ Clinical Pharmacy Specialist

## 2014-11-15 MED ORDER — EZETIMIBE 10 MG PO TABS: 10.0000 mg | ORAL_TABLET | Freq: Every day | ORAL | Status: DC

## 2014-11-15 NOTE — Progress Notes (Signed)
11/15/2014 NEIVA MAENZA [birth year] [id number]   HPI:  Eileen Burgess is a 59 y.o. female patient of Dr Ellyn Hack, who presents today for a lipid clinic evaluation.  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 Range 10/12/2014 11:19  Cholesterol Latest Range: 0-200 mg/dL 232 (H)  Triglycerides Latest Range: <150 mg/dL 111  HDL Latest Range: >39 mg/dL 37 (L)  LDL (calc) Latest Range: 0-99 mg/dL 173 (H)  VLDL Latest Range: 0-40 mg/dL 22  Total CHOL/HDL Ratio Latest Units: Ratio 6.3     Current Outpatient Prescriptions  Medication Sig Dispense Refill  . ALPRAZolam (XANAX) 0.25 MG tablet TAKE 1 TO 2  TABLET DAILY AS NEEDED FOR ANXIETY AND, SLEEP 180 tablet 0  . aspirin EC 81 MG tablet Take 81 mg by mouth daily.    . Coenzyme Q10 (CO Q 10) 100 MG CAPS Take 1 capsule by mouth daily.    . fenofibrate 54 MG tablet Take 1 tablet (54 mg total) by mouth daily. 30 tablet 11  . hydrochlorothiazide (MICROZIDE) 12.5 MG capsule TAKE 1 CAPSULE BY MOUTH DAILY 90 capsule 3  . lisinopril (PRINIVIL,ZESTRIL) 20 MG tablet TAKE 1 TABLET BY MOUTH ONCE DAILY 90 tablet 3  . Melatonin 3 MG TABS Take by mouth at bedtime.    . metoprolol succinate (TOPROL-XL) 25 MG 24 hr  tablet TAKE 1 TABLET BY MOUTH DAILY 30 tablet 9  . nitroGLYCERIN (NITROSTAT) 0.4 MG SL tablet Place 1 tablet (0.4 mg total) under the tongue every 5 (five) minutes as needed for chest pain. 25 tablet 3  . omeprazole (PRILOSEC) 20 MG capsule Take 20 mg by mouth daily.    Marland Kitchen PREVIDENT 5000 ENAMEL PROTECT 1.1-5 % PSTE      No current facility-administered medications for this visit.    Allergies  Allergen Reactions  . Crestor [Rosuvastatin] Other (See Comments)    LEGS HURTING  . Pravastatin Other (See Comments)    myalagia  . Shrimp [Shellfish Allergy]   . Vytorin [Ezetimibe-Simvastatin] Other (See Comments)    myalgia    Past Medical History  Diagnosis Date  . CAD S/P percutaneous coronary angioplasty 2005    PCI to pLAD 99%; 3.0 mm x 38 mm Cypher DES; EF ~45% - distal, septal apical hypokinesis    . 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 Dyslipidemia, goal LDL below 70   . Essential hypertension   . GERD (gastroesophageal reflux disease)   . Insomnia     Height 5\' 4"  (1.626 m), weight 211 lb (95.709 kg).  Tommy Medal PharmD CPP [facility]

## 2014-11-15 NOTE — Assessment & Plan Note (Addendum)
Pt with previous good LDL cholesterol on vytorin 10/40 for several years.  Unfortunately patient developed myalgias over time and had to discontinue.  Future attempts with multiple statins brought about same cramping and weakness.  Pt had also taken niacin for some time, but complained of increased problems with flushing and finally stopped.   Records show she tried Zetia by itself in the past, but patient cannot recall if caused any problems.  Today will start Zetia at 5 mg (1/2 tablet) daily and increase to 1 tablet as tolerated, gave patient prescription for free 30 tabs.  Encouraged her to work on diet, increasing fiber naturally as well as considering adding Metamucil capsules daily.  Also encouraged she try Cholestoff, by Petra Kuba Made, 2 capsules daily.  Will repeat labs in May, then move forward.  She may need PCSK-9, however with her insurance deductibles copays, we will have to see how the medication prices.

## 2014-12-15 ENCOUNTER — Telehealth: Payer: Self-pay | Admitting: *Deleted

## 2014-12-15 DIAGNOSIS — E669 Obesity, unspecified: Secondary | ICD-10-CM

## 2014-12-15 DIAGNOSIS — Z789 Other specified health status: Secondary | ICD-10-CM

## 2014-12-15 DIAGNOSIS — Z79899 Other long term (current) drug therapy: Secondary | ICD-10-CM

## 2014-12-15 DIAGNOSIS — E785 Hyperlipidemia, unspecified: Secondary | ICD-10-CM

## 2014-12-15 NOTE — Telephone Encounter (Signed)
Mail letter and lab slip  FOR CMP AND LIPID DUE BEFORE 01/17/15

## 2014-12-15 NOTE — Telephone Encounter (Signed)
-----   Message from Raiford Simmonds, RN sent at 10/19/2014 11:06 AM EST ----- LIPID , CMP MAIL IN April 2016 SCHEDULE IN MAY 2016

## 2015-02-15 ENCOUNTER — Other Ambulatory Visit: Payer: Self-pay | Admitting: Cardiology

## 2015-02-15 NOTE — Telephone Encounter (Signed)
Rx(s) sent to pharmacy electronically.  

## 2015-02-28 LAB — LIPID PANEL
CHOLESTEROL: 210 mg/dL — AB (ref 0–200)
HDL: 45 mg/dL — ABNORMAL LOW (ref >=46)
LDL Cholesterol: 140 mg/dL — ABNORMAL HIGH (ref 0–99)
Total CHOL/HDL Ratio: 4.7 Ratio
Triglycerides: 125 mg/dL (ref <150)
VLDL: 25 mg/dL (ref 0–40)

## 2015-02-28 LAB — COMPREHENSIVE METABOLIC PANEL
ALBUMIN: 3.8 g/dL (ref 3.5–5.2)
ALT: 9 U/L (ref 0–35)
AST: 16 U/L (ref 0–37)
Alkaline Phosphatase: 57 U/L (ref 39–117)
BUN: 12 mg/dL (ref 6–23)
CO2: 27 meq/L (ref 19–32)
Calcium: 9.5 mg/dL (ref 8.4–10.5)
Chloride: 102 mEq/L (ref 96–112)
Creat: 1.27 mg/dL — ABNORMAL HIGH (ref 0.50–1.10)
GLUCOSE: 95 mg/dL (ref 70–99)
Potassium: 4.4 mEq/L (ref 3.5–5.3)
SODIUM: 139 meq/L (ref 135–145)
TOTAL PROTEIN: 6.8 g/dL (ref 6.0–8.3)
Total Bilirubin: 0.4 mg/dL (ref 0.2–1.2)

## 2015-03-14 NOTE — Telephone Encounter (Signed)
Quick Note:  Chem panel - kidney function suggests that you may be a bit dehydrated, increase water/fluid beverage intake. We will recheck on next set of labs.  Lipid panel definitely shows improvement all around, but still not @ goal. Continue Plan & f/u with Erasmo Downer.  [name], MD  ______

## 2015-03-30 ENCOUNTER — Ambulatory Visit: Payer: 59 | Admitting: Pharmacist Clinician (PhC)/ Clinical Pharmacy Specialist

## 2015-04-03 ENCOUNTER — Other Ambulatory Visit: Payer: Self-pay | Admitting: Cardiology

## 2015-04-03 NOTE — Telephone Encounter (Signed)
REFILL 

## 2015-04-12 ENCOUNTER — Other Ambulatory Visit: Payer: Self-pay | Admitting: Cardiology

## 2015-04-12 NOTE — Telephone Encounter (Signed)
Rx(s) sent to pharmacy electronically.  

## 2015-04-26 ENCOUNTER — Other Ambulatory Visit: Payer: Self-pay | Admitting: Nurse Practitioner

## 2015-04-26 DIAGNOSIS — Z1231 Encounter for screening mammogram for malignant neoplasm of breast: Secondary | ICD-10-CM

## 2015-04-27 ENCOUNTER — Ambulatory Visit (INDEPENDENT_AMBULATORY_CARE_PROVIDER_SITE_OTHER): Payer: 59 | Admitting: Pharmacist Clinician (PhC)/ Clinical Pharmacy Specialist

## 2015-04-27 ENCOUNTER — Encounter: Payer: Self-pay | Admitting: Pharmacist Clinician (PhC)/ Clinical Pharmacy Specialist

## 2015-04-27 VITALS — Ht 64.0 in | Wt 212.0 lb

## 2015-04-27 DIAGNOSIS — E785 Hyperlipidemia, unspecified: Secondary | ICD-10-CM | POA: Diagnosis not present

## 2015-04-27 NOTE — Patient Instructions (Signed)
Continue with the Zetia as you can tolerate.    Once you figure out your new insurance, please bring a copy of the card and we can start the process of getting you Praluent or Repatha.

## 2015-04-27 NOTE — Progress Notes (Signed)
04/27/2015 [name] [birth year] [id number]   HPI:  Eileen Burgess is a 59 y.o. female patient of Dr Ellyn Hack, who presents today for a lipid clinic follow up.  She has been taking Zetia for the past 5 months, but can only take for 4-5 days until muscle aches develop, then she stops for 3-4 days and resumes.  She isn't too bothered by taking it this way.   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 Range 10/12/2014 11:19 02/2015  Cholesterol Latest Range: 0-200 mg/dL 232 (H) 210  Triglycerides Latest Range: <150 mg/dL 111 125  HDL Latest Range: >39 mg/dL 37 (L) 45  LDL (calc) Latest Range: 0-99 mg/dL 173 (H) 140  VLDL Latest Range: 0-40 mg/dL 22   Total CHOL/HDL Ratio Latest Units: Ratio 6.3      Current Outpatient Prescriptions  Medication Sig Dispense Refill  . ALPRAZolam (XANAX) 0.25 MG tablet TAKE 1 TO 2  TABLET DAILY AS NEEDED FOR ANXIETY AND, SLEEP 180 tablet 0  . aspirin EC 81 MG tablet Take 81 mg by mouth daily.    . Coenzyme Q10 (CO Q 10) 100 MG CAPS Take 1 capsule by mouth daily.    Marland Kitchen ezetimibe (ZETIA) 10 MG tablet Take 1 tablet (10 mg total) by mouth daily. 30 tablet 2  . fenofibrate 54 MG tablet Take 1  tablet (54 mg total) by mouth daily. 30 tablet 11  . hydrochlorothiazide (MICROZIDE) 12.5 MG capsule TAKE 1 CAPSULE BY MOUTH DAILY 90 capsule 1  . lisinopril (PRINIVIL,ZESTRIL) 20 MG tablet TAKE 1 TABLET BY MOUTH DAILY 90 tablet 3  . Melatonin 3 MG TABS Take by mouth at bedtime.    . metoprolol succinate (TOPROL-XL) 25 MG 24 hr tablet TAKE 1 TABLET BY MOUTH ONCE DAILY 30 tablet 8  . nitroGLYCERIN (NITROSTAT) 0.4 MG SL tablet Place 1 tablet (0.4 mg total) under the tongue every 5 (five) minutes as needed for chest pain. 25 tablet 3  . omeprazole (PRILOSEC) 20 MG capsule Take 20 mg by mouth daily.    Marland Kitchen PREVIDENT 5000 ENAMEL PROTECT 1.1-5 % PSTE      No current facility-administered medications for this visit.    Allergies  Allergen Reactions  . Crestor [Rosuvastatin] Other (See Comments)    LEGS HURTING  . Pravastatin Other (See Comments)    myalagia  . Shrimp [Shellfish Allergy]   . Vytorin [Ezetimibe-Simvastatin] Other (See Comments)    myalgia    Past Medical History  Diagnosis Date  . CAD S/P percutaneous coronary angioplasty 2005    PCI to pLAD  99%; 3.0 mm x 38 mm Cypher DES; EF ~45% - distal, septal apical hypokinesis    . 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 Dyslipidemia, goal LDL below 70   . Essential hypertension   . GERD (gastroesophageal reflux disease)   . Insomnia     Height 5\' 4"  (1.626 m), weight 212 lb (96.163 kg).  Tommy Medal PharmD CPP [facility]

## 2015-04-27 NOTE — Assessment & Plan Note (Signed)
Although she is tolerating the Zetia for the most part, it is not enough to get her LDL to goal.  She is currently on [facility] thru Obamacare and is hoping to get commercial insurance thru her employer in the near future.  Regardless, she would have to pick a new plan for 2017, as [employer] will no longer insure in [place].  She commented that she had previously done well with Vytorin and wondered if she was going to take meds for only 5 days on and 3 days off, maybe she would see better results with Vytorin.  Gave her some samples of Vytorin 10/40 and suggested that if they work the same, she should call and let me know.  Once she has new insurance she will call and we can get her started for approval for a PCSK-9 inhibitor.

## 2015-05-10 ENCOUNTER — Telehealth: Payer: Self-pay | Admitting: Cardiology

## 2015-05-10 NOTE — Telephone Encounter (Signed)
Pt is going to have tooth extracted tomorrow. She has a stent,does she need an antibiotic?

## 2015-05-10 NOTE — Telephone Encounter (Signed)
Patient called and told she will not need an antibiotic for a tooth extraction post stent

## 2015-05-10 NOTE — Telephone Encounter (Signed)
Correct - no need for Abx.  [name], MD

## 2015-05-12 ENCOUNTER — Ambulatory Visit (INDEPENDENT_AMBULATORY_CARE_PROVIDER_SITE_OTHER): Payer: 59 | Admitting: Cardiology

## 2015-05-12 ENCOUNTER — Encounter: Payer: Self-pay | Admitting: Cardiology

## 2015-05-12 VITALS — BP 130/86 | HR 68 | Ht 64.0 in | Wt 214.4 lb

## 2015-05-12 DIAGNOSIS — E669 Obesity, unspecified: Secondary | ICD-10-CM

## 2015-05-12 DIAGNOSIS — E785 Hyperlipidemia, unspecified: Secondary | ICD-10-CM | POA: Diagnosis not present

## 2015-05-12 DIAGNOSIS — Z889 Allergy status to unspecified drugs, medicaments and biological substances status: Secondary | ICD-10-CM

## 2015-05-12 DIAGNOSIS — I10 Essential (primary) hypertension: Secondary | ICD-10-CM

## 2015-05-12 DIAGNOSIS — Z789 Other specified health status: Secondary | ICD-10-CM

## 2015-05-12 DIAGNOSIS — Z9861 Coronary angioplasty status: Secondary | ICD-10-CM

## 2015-05-12 DIAGNOSIS — I2510 Atherosclerotic heart disease of native coronary artery without angina pectoris: Secondary | ICD-10-CM

## 2015-05-12 NOTE — Progress Notes (Signed)
PCP: Berkley Harvey, NP  Clinic Note: Chief Complaint  Patient presents with  . Follow-up    6 months:   No cardiac complaints  . Coronary Artery Disease    HPI: Eileen Burgess is a 59 y.o. female with a PMH below who presents today for routine ~6 month f/u for CAD-PCI.  Eileen Burgess was last seen in Feb 2016; Seen by Tommy Medal, [facility] in August 2016 for lipid management.  Hope is to try to restart Vytorin Statin Intolerant: vytorin - took 2005 post MI until 02/2011 when developed myalgias (leg cramps and weakness);   Pravastatin 40 mg - 03/2012  Niaspan 2006-2011 - caused flushing  Crestor 5 mg - 03/2013 took continual decreasing doses from 5 mg qd down to 5 mg qwk, myalgias continued  Recent Hospitalizations: N/A  Studies Reviewed: see labs below. No new studies  Interval History: Eileen Burgess presents today doing pretty well. She doesn't really have much to live any significant symptoms as far as chest tightness and pressure with rest or exertion. Her biggest issue today is that she's a little bit out of it after having a dental abscess work on yesterday. She feels somewhat like a zombie.   She denies any PND, orthopnea with only mild occasional edema in the day.  No palpitations, lightheadedness, dizziness, weakness or syncope/near syncope. No TIA/amaurosis fugax symptoms. No claudication.  She seems to be tolerating the Vytorin pretty well. Is also on a supplement called statinzyme.  Not really supraciliary myalgias.   Past Medical History  Diagnosis Date  . CAD S/P percutaneous coronary angioplasty 2005    PCI to pLAD 99%; 3.0 mm x 38 mm Cypher DES; EF ~45% - distal, septal apical hypokinesis    . 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 Dyslipidemia, goal LDL below 70   . Essential hypertension   . GERD (gastroesophageal reflux disease)   . Insomnia     Past Surgical History  Procedure Laterality Date  .  Coronary angioplasty with stent placement  2005    prox LAD 3.0 mm x 28 mm Cypher DES; circumflex is nondominant with 2 large EOMs. Dominant RCA is a large PDA and posterolateral branch.  . Nm myoview ltd  10/2012    8:50 min; 9 METS.  No ischemia or Infarction; EF ~68%  . Cesarean section  05/18/1983  . Ovarian cyst removal  12/1968    ROS: A comprehensive was performed. Review of Systems  HENT: Negative for ear discharge and nosebleeds.   Respiratory: Negative for cough.   Cardiovascular: Negative.  Negative for claudication.       Per history of present illness  Gastrointestinal: Negative for blood in stool and melena.  Musculoskeletal: Negative for myalgias and joint pain.  Neurological: Negative for dizziness and tingling.  Endo/Heme/Allergies: Does not bruise/bleed easily.    Prior to Admission medications   Medication Sig Start Date End Date Taking? Authorizing Provider  ALPRAZolam Duanne Moron) 0.25 MG tablet TAKE 1 TO 2  TABLET DAILY AS NEEDED FOR ANXIETY AND, SLEEP 10/12/14  Yes [name], MD  aspirin EC 81 MG tablet Take 81 mg by mouth daily.   Yes Historical Provider, MD  Coenzyme Q10 (CO Q 10) 100 MG CAPS Take 1 capsule by mouth daily.   Yes Historical Provider, MD  ezetimibe-simvastatin (VYTORIN) 10-40 MG per tablet Take 1 tablet by mouth daily.   Yes Historical Provider, MD  hydrochlorothiazide (MICROZIDE) 12.5 MG capsule TAKE 1 CAPSULE  BY MOUTH DAILY 04/03/15  Yes [name], MD  lisinopril (PRINIVIL,ZESTRIL) 20 MG tablet TAKE 1 TABLET BY MOUTH DAILY 04/12/15  Yes [name], MD  metoprolol succinate (TOPROL-XL) 25 MG 24 hr tablet TAKE 1 TABLET BY MOUTH ONCE DAILY 02/15/15  Yes [name], MD  nitroGLYCERIN (NITROSTAT) 0.4 MG SL tablet Place 1 tablet (0.4 mg total) under the tongue every 5 (five) minutes as needed for chest pain. 02/15/14  Yes [name], MD  omeprazole (PRILOSEC) 20 MG capsule Take 20 mg by mouth daily.   Yes Historical Provider, MD  OVER  THE COUNTER MEDICATION Take 2 capsules by mouth daily. statinzyme   Yes Historical Provider, MD  PREVIDENT 5000 ENAMEL PROTECT 1.1-5 % PSTE  02/14/14  Yes Historical Provider, MD   Allergies  Allergen Reactions  . Crestor [Rosuvastatin] Other (See Comments)    LEGS HURTING  . Pravastatin Other (See Comments)    myalagia  . Shrimp [Shellfish Allergy]   . Vytorin [Ezetimibe-Simvastatin] Other (See Comments)    myalgia    Social History   Social History  . Marital Status: Married    Spouse Name: N/A  . Number of Children: N/A  . Years of Education: N/A   Social History Main Topics  . Smoking status: Former Smoker    Quit date: 07/03/2004  . Smokeless tobacco: None  . Alcohol Use: No  . Drug Use: No  . Sexual Activity: Not Asked   Other Topics Concern  . None   Social History Narrative   Widow.  Works for [employer].   Has been quite active, but no routine exercise.   Quit Smoking in 2005 before her MI.  Does not drink alcohol.    Family History  Problem Relation Age of Onset  . Stroke Mother   . Hypertension Mother   . Hyperlipidemia Father   . Hyperlipidemia Son     not known, pt assumes because of poor diet    Wt Readings from Last 3 Encounters:  05/12/15 214 lb 6.4 oz (97.251 kg)  04/27/15 212 lb (96.163 kg)  11/15/14 211 lb (95.709 kg)    PHYSICAL EXAM BP 130/86 mmHg  Pulse 68  Ht 5\' 4"  (1.626 m)  Wt 214 lb 6.4 oz (97.251 kg)  BMI 36.78 kg/m2 General appearance: alert, cooperative, appears stated age, no distress and moderately obese Neck: no adenopathy, no carotid bruit, no JVD and supple, symmetrical, trachea midline Lungs: clear to auscultation bilaterally, normal percussion bilaterally and Nonlabored, good air movement Heart: regular rate and rhythm, S1, S2 normal, no murmur, click, rub or gallop and normal apical impulse Abdomen: soft, non-tender; bowel sounds normal; no masses, no organomegaly and Obese Extremities: extremities  normal, atraumatic, no cyanosis or edema, no edema, redness or tenderness in the calves or thighs and no ulcers, gangrene or trophic changes Pulses: 2+ and symmetric Neurologic: Grossly normal   Adult ECG Report  Rate: 68 ;  Rhythm: normal sinus rhythm; normal axis, intervals and durations.  Narrative Interpretation: normal EKG   Other studies Reviewed: Additional studies/ records that were reviewed today include:  Recent Labs:   Lab Results  Component Value Date   CHOL 210* 02/27/2015   HDL 45* 02/27/2015   LDLCALC 140* 02/27/2015   TRIG 125 02/27/2015   CHOLHDL 4.7 02/27/2015    ASSESSMENT / PLAN: Problem List Items Addressed This Visit    CAD S/P percutaneous coronary angioplasty - Primary (Chronic)    Stable  any active symptoms well over 10 years post PCA. At this point are probably okay on aspirin as is a relatively large stent. She is on beta blocker and ACE inhibitor. She is now also back on her statin. She also has when necessary nitroglycerin.      Relevant Medications   ezetimibe-simvastatin (VYTORIN) 10-40 MG per tablet   Dyslipidemia, goal LDL below 70 (Chronic)    LDL was not at goal normal total cholesterol during last check. She is now been on Vytorin for couple months. She is following up with Tommy Medal, [facility] - for close monitoring. Fenofibrate was stopped      Relevant Medications   ezetimibe-simvastatin (VYTORIN) 10-40 MG per tablet   Essential hypertension    Well controlled on current meds. No change      Relevant Medications   ezetimibe-simvastatin (VYTORIN) 10-40 MG per tablet   Obesity (BMI 30-39.9) (Chronic)    Need to make time for exercise and work on dietary modification.      Statin intolerance (Chronic)    Actually tolerating Vytorin relatively well at this point.  Using CoQ10 & StatinZyme         Current medicines are reviewed at length with the patient today. (+/- concerns) tolerating Vytorin The following changes have been  made: continue current meds Studies Ordered:   No orders of the defined types were placed in this encounter.     ROV 1 yr.   [name], M.D., M.S. Interventional Cardiologist   Pager # [phone]

## 2015-05-12 NOTE — Patient Instructions (Signed)
No changes in medications  Keep appointment with [place] physician wants you to follow-up in [place].  You will receive a reminder letter in the mail two months in advance. If you don't receive a letter, please call our office to schedule the follow-up appointment.

## 2015-05-14 ENCOUNTER — Encounter: Payer: Self-pay | Admitting: Cardiology

## 2015-05-14 NOTE — Assessment & Plan Note (Signed)
Stable any active symptoms well over 10 years post PCA. At this point are probably okay on aspirin as is a relatively large stent. She is on beta blocker and ACE inhibitor. She is now also back on her statin. She also has when necessary nitroglycerin.

## 2015-05-14 NOTE — Assessment & Plan Note (Signed)
Need to make time for exercise and work on dietary modification.

## 2015-05-14 NOTE — Assessment & Plan Note (Signed)
LDL was not at goal normal total cholesterol during last check. She is now been on Vytorin for couple months. She is following up with Tommy Medal, [facility] - for close monitoring. Fenofibrate was stopped

## 2015-05-14 NOTE — Assessment & Plan Note (Signed)
Well-controlled on current meds.  No change 

## 2015-05-14 NOTE — Assessment & Plan Note (Addendum)
Actually tolerating Vytorin relatively well at this point.  Using CoQ10 & StatinZyme

## 2015-05-29 ENCOUNTER — Encounter: Payer: Self-pay | Admitting: Nurse Practitioner

## 2015-07-24 ENCOUNTER — Encounter: Payer: Self-pay | Admitting: Nurse Practitioner

## 2015-10-04 ENCOUNTER — Other Ambulatory Visit: Payer: Self-pay | Admitting: Cardiology

## 2015-10-04 NOTE — Telephone Encounter (Signed)
Rx(s) sent to pharmacy electronically.  

## 2015-11-13 ENCOUNTER — Other Ambulatory Visit: Payer: Self-pay | Admitting: Cardiology

## 2015-11-13 NOTE — Telephone Encounter (Signed)
REFILL 

## 2016-03-18 ENCOUNTER — Telehealth: Payer: Self-pay | Admitting: Pharmacist Clinician (PhC)/ Clinical Pharmacy Specialist

## 2016-03-18 NOTE — Telephone Encounter (Signed)
NEw Message  Pt call requesting to speak with RN to schedule appt with Pharmacist about cholesterol

## 2016-03-18 NOTE — Telephone Encounter (Signed)
Left message to call back  

## 2016-03-21 NOTE — Telephone Encounter (Signed)
LMOM for patient to call scheduling and ask for appt with Pharmacy.

## 2016-03-25 ENCOUNTER — Ambulatory Visit: Payer: Self-pay

## 2016-04-04 ENCOUNTER — Other Ambulatory Visit: Payer: Self-pay | Admitting: Cardiology

## 2016-04-05 NOTE — Telephone Encounter (Signed)
Rx(s) sent to pharmacy electronically.  

## 2016-04-16 ENCOUNTER — Other Ambulatory Visit: Payer: Self-pay | Admitting: Cardiology

## 2016-05-24 ENCOUNTER — Encounter: Payer: Self-pay | Admitting: Cardiology

## 2016-05-24 ENCOUNTER — Ambulatory Visit (INDEPENDENT_AMBULATORY_CARE_PROVIDER_SITE_OTHER): Payer: 59 | Admitting: Cardiology

## 2016-05-24 VITALS — BP 128/78 | HR 63 | Ht 64.0 in | Wt 218.6 lb

## 2016-05-24 DIAGNOSIS — E785 Hyperlipidemia, unspecified: Secondary | ICD-10-CM

## 2016-05-24 DIAGNOSIS — E669 Obesity, unspecified: Secondary | ICD-10-CM

## 2016-05-24 DIAGNOSIS — Z9861 Coronary angioplasty status: Secondary | ICD-10-CM

## 2016-05-24 DIAGNOSIS — I252 Old myocardial infarction: Secondary | ICD-10-CM

## 2016-05-24 DIAGNOSIS — Z789 Other specified health status: Secondary | ICD-10-CM

## 2016-05-24 DIAGNOSIS — Z889 Allergy status to unspecified drugs, medicaments and biological substances status: Secondary | ICD-10-CM

## 2016-05-24 DIAGNOSIS — I10 Essential (primary) hypertension: Secondary | ICD-10-CM | POA: Diagnosis not present

## 2016-05-24 DIAGNOSIS — I2510 Atherosclerotic heart disease of native coronary artery without angina pectoris: Secondary | ICD-10-CM

## 2016-05-24 NOTE — Progress Notes (Signed)
PCP: Berkley Harvey, NP  Clinic Note: Chief Complaint  Patient presents with  . Annual Exam    Pt states no Sx or concerns.  . Coronary Artery Disease    HPI: Eileen Burgess is a 60 y.o. female with a PMH below who presents today for routine 12 month f/u for CAD-PCI. 1. CAD S/P percutaneous coronary angioplasty   2. Dyslipidemia, goal LDL below 70   3. Statin intolerance   4. Essential hypertension   5. History of acute anterior wall MI   6. Obesity (BMI 30-39.9)    Eileen Burgess was last seen in Feb 2016; Seen by Tommy Medal, [facility] in August 2016 for lipid management.  Hope is to try to restart Vytorin Statin Intolerant: vytorin - took 2005 post MI until 02/2011 when developed myalgias (leg cramps and weakness);   Pravastatin 40 mg - 03/2012  Niaspan 2006-2011 - caused flushing  Crestor 5 mg - 03/2013 took continual decreasing doses from 5 mg qd down to 5 mg qwk, myalgias continued;   No longer on Vytorin.   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 She cooks for him once a week and spends time helping him keep his life arranged.  Recent Hospitalizations: N/A  Studies Reviewed: see labs below. No new studies   Interval History: Fair presents today doing pretty well. She doesn't really have any significant symptoms as far as chest tightness and pressure with rest or exertion. She says she does a lot of walking at work. She is now working only 4 days a week working now on which does give her some relaxation time. This is been replaced with having to care for her father. She does think it's important for her to get back and her walking. She basically just Come out of the habit of controlling her exercise and diet. She also is in the process of changing her PCP. She denies any PND, orthopnea with only mild occasional edema in the day.  No palpitations, lightheadedness, dizziness,  weakness or syncope/near syncope. -- She did have one episode where she was out mowing the lawn in the unit the day, not having eaten a meal. She did feel some dizziness and fatigue associated with that. His past with coming back into the air conditioning and eating and drinking something. Otherwise has been a pretty well. She also notes that her heart rate will get up if she is excited or anxious. Been trying to avoid coffee or caffeine to reduce her palpitations. She's also trying to take less Xanax to help her sleep. No TIA/amaurosis fugax symptoms. No claudication.  She is no longer taking the Vytorin and as still not followed up with lipid clinic   Past Medical History:  Diagnosis Date  . CAD S/P percutaneous coronary angioplasty 2005   PCI to pLAD 99%; 3.0 mm x 38 mm Cypher DES; EF ~45% - distal, septal apical hypokinesis    . Dyslipidemia, goal LDL below 70   . Essential hypertension   . GERD (gastroesophageal reflux disease)   . 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 Insomnia     Past Surgical History:  Procedure Laterality Date  . CESAREAN SECTION  05/18/1983  . CORONARY ANGIOPLASTY WITH STENT PLACEMENT  2005   prox LAD 3.0 mm x 28 mm Cypher DES; circumflex is nondominant with 2 large EOMs.  Dominant RCA is a large PDA and posterolateral branch.  . NM MYOVIEW LTD  10/2012   8:50 min; 9 METS.  No ischemia or Infarction; EF ~68%  . OVARIAN CYST REMOVAL  12/1968    ROS: A comprehensive was performed. Review of Systems  Constitutional: Negative for malaise/fatigue.  HENT: Negative for ear discharge and nosebleeds.   Respiratory: Negative for cough and shortness of breath.   Cardiovascular: Negative.  Negative for claudication.       Per history of present illness  Gastrointestinal: Negative for blood in stool and melena.  Genitourinary: Negative for hematuria.  Musculoskeletal: Positive for joint pain (hips & legs). Negative for myalgias.  Neurological: Negative for  dizziness and tingling.  Endo/Heme/Allergies: Does not bruise/bleed easily.  Psychiatric/Behavioral: Negative for depression and memory loss. The patient is not nervous/anxious and does not have insomnia.   All other systems reviewed and are negative.   Prior to Admission medications   Medication Sig Start Date End Date Taking? Authorizing Provider  ALPRAZolam (XANAX) 0.25 MG tablet TAKE 1 TO 2  TABLET DAILY AS NEEDED FOR ANXIETY AND, SLEEP Patient taking differently: TAKE 1 TO 2  TABLET DAILY AS NEEDED FOR ANXIETY AS NEEDED 10/12/14  Yes [name], MD  aspirin EC 81 MG tablet Take 81 mg by mouth daily.   Yes Historical Provider, MD  Coenzyme Q10 (CO Q 10) 100 MG CAPS Take 1 capsule by mouth daily.   Yes Historical Provider, MD  hydrochlorothiazide (MICROZIDE) 12.5 MG capsule Take 1 capsule (12.5 mg total) by mouth daily. Please keep up coming appointment for more refills 04/16/16  Yes [name], MD  lisinopril (PRINIVIL,ZESTRIL) 20 MG tablet TAKE 1 TABLET BY MOUTH DAILY 04/05/16  Yes [name], MD  metoprolol succinate (TOPROL-XL) 25 MG 24 hr tablet TAKE 1 TABLET BY MOUTH ONCE DAILY 11/13/15  Yes [name], MD  nitroGLYCERIN (NITROSTAT) 0.4 MG SL tablet Place 1 tablet (0.4 mg total) under the tongue every 5 (five) minutes as needed for chest pain. 02/15/14  Yes [name], MD  omeprazole (PRILOSEC) 20 MG capsule Take 20 mg by mouth daily.   Yes Historical Provider, MD  PREVIDENT 5000 ENAMEL PROTECT 1.1-5 % PSTE  02/14/14  Yes Historical Provider, MD     Allergies  Allergen Reactions  . Crestor [Rosuvastatin] Other (See Comments)    LEGS HURTING  . Pravastatin Other (See Comments)    myalagia  . Shrimp [Shellfish Allergy]   . Vytorin [Ezetimibe-Simvastatin] Other (See Comments)    myalgia    Social History   Social History  . Marital status: Married    Spouse name: N/A  . Number of children: N/A  . Years of education: N/A   Social History Main Topics  .  Smoking status: Former Smoker    Quit date: 07/03/2004  . Smokeless tobacco: Never Used  . Alcohol use No  . Drug use: No  . Sexual activity: Not Asked   Other Topics Concern  . None   Social History Narrative   Widow.  Works for [employer].   Has been quite active, but no routine exercise.   Quit Smoking in 2005 before her MI.  Does not drink alcohol.    Family History  Problem Relation Age of Onset  . Stroke Mother   . Hypertension Mother   . Hyperlipidemia Father   . Hyperlipidemia Son     not known, pt assumes because of poor diet  Wt Readings from Last 3 Encounters:  05/24/16 99.2 kg (218 lb 9.6 oz)  05/12/15 97.3 kg (214 lb 6.4 oz)  04/27/15 96.2 kg (212 lb)    PHYSICAL EXAM BP 128/78   Pulse 63   Ht 5\' 4"  (1.626 m)   Wt 99.2 kg (218 lb 9.6 oz)   BMI 37.52 kg/m  General appearance: alert, cooperative, appears stated age, no distress and moderately obese Neck: no adenopathy, no carotid bruit, no JVD and supple, symmetrical, trachea midline Lungs: clear to auscultation bilaterally, normal percussion bilaterally and Nonlabored, good air movement Heart: regular rate and rhythm, S1, S2 normal, no murmur, click, rub or gallop and normal apical impulse Abdomen: soft, non-tender; bowel sounds normal; no masses, no organomegaly and Obese Extremities: extremities normal, atraumatic, no cyanosis or edema, no edema, redness or tenderness in the calves or thighs and no ulcers, gangrene or trophic changes Pulses: 2+ and symmetric Neurologic: Grossly normal   Adult ECG Report  Rate: 63 ;  Rhythm: normal sinus rhythm; normal axis, intervals and durations.  Narrative Interpretation: normal EKG   Other studies Reviewed: Additional studies/ records that were reviewed today include:  Recent Labs:   Lab Results  Component Value Date   CHOL 210 (H) 02/27/2015   HDL 45 (L) 02/27/2015   LDLCALC 140 (H) 02/27/2015   TRIG 125 02/27/2015   CHOLHDL 4.7  02/27/2015    ASSESSMENT / PLAN: Problem List Items Addressed This Visit    Statin intolerance (Chronic)    At this time, I think she probably will need PCSK9 inhibitor therapy Referring to the clinic.      Relevant Orders   EKG 12-Lead   Lipid panel   Comprehensive metabolic panel   Obesity (BMI 30-39.9) (Chronic)    The patient understands the need to lose weight with diet and exercise. We have discussed specific strategies for this.      History of acute anterior wall MI    Now just about 12 years out from her MI. Thankfully no anterior infarct noted on follow-up Myoview, and preserved EF /wall motion on echo.      Essential hypertension (Chronic)    Well-controlled. No change. Continue beta blocker, ACE inhibitor and HCTZ.      Relevant Orders   EKG 12-Lead   Lipid panel   Comprehensive metabolic panel   Dyslipidemia, goal LDL below 70 (Chronic)    Unfortunately, she still has not taken any medications. Has not followed up with lipid clinic. I think she was worried about paying for PCSK9 inhibitors. Plan will be to have her follow back up with lipid clinic. She will need lipids before that.  She has not tolerated Vytorin, fenofibrate and other statins.      Relevant Orders   EKG 12-Lead   Lipid panel   Comprehensive metabolic panel   CAD S/P percutaneous coronary angioplasty - Primary (Chronic)    Stable without any active anginal symptoms now greater than 10 years out from her PCI. She is on aspirin alone without statin. On beta blocker and ACE inhibitor/HCTZ. Unfortunately in still not tolerating statin. She is taking coenzyme Q 10.      Relevant Orders   EKG 12-Lead   Lipid panel   Comprehensive metabolic panel    Other Visit Diagnoses   None.     Current medicines are reviewed at length with the patient today. (+/- concerns) tolerating Vytorin The following changes have been made: continue current meds Studies Ordered:   Orders  Placed This  Encounter  Procedures  . Lipid panel  . Comprehensive metabolic panel  . EKG 12-Lead     ROV 1 yr.   Glenetta Hew, M.D., M.S. Interventional Cardiologist   Pager # [phone]

## 2016-05-24 NOTE — Patient Instructions (Signed)
NO CHANGES WITH CURRENT MEDICATONS   PLEASE DO LIPIDS ,CMP PRIOR TO YOUR APPOINTMENT WITH LIPID CLINIC   Your physician recommends that you schedule a follow-up appointment in: [place]  Your physician wants you to follow-up in: [place]. You will receive a reminder letter in the mail two months in advance. If you don't receive a letter, please call our office to schedule the follow-up appointment.  If you need a refill on your cardiac medications before your next appointment, please call your pharmacy.

## 2016-05-26 ENCOUNTER — Encounter: Payer: Self-pay | Admitting: Cardiology

## 2016-05-26 NOTE — Assessment & Plan Note (Signed)
Now just about 12 years out from her MI. Thankfully no anterior infarct noted on follow-up Myoview, and preserved EF /wall motion on echo.

## 2016-05-26 NOTE — Assessment & Plan Note (Signed)
At this time, I think she probably will need PCSK9 inhibitor therapy Referring to the clinic.

## 2016-05-26 NOTE — Assessment & Plan Note (Signed)
Stable without any active anginal symptoms now greater than 10 years out from her PCI. She is on aspirin alone without statin. On beta blocker and ACE inhibitor/HCTZ. Unfortunately in still not tolerating statin. She is taking coenzyme Q 10.

## 2016-05-26 NOTE — Assessment & Plan Note (Signed)
The patient understands the need to lose weight with diet and exercise. We have discussed specific strategies for this.  

## 2016-05-26 NOTE — Assessment & Plan Note (Signed)
Well-controlled. No change. Continue beta blocker, ACE inhibitor and HCTZ.

## 2016-05-26 NOTE — Assessment & Plan Note (Signed)
Unfortunately, she still has not taken any medications. Has not followed up with lipid clinic. I think she was worried about paying for PCSK9 inhibitors. Plan will be to have her follow back up with lipid clinic. She will need lipids before that.  She has not tolerated Vytorin, fenofibrate and other statins.

## 2016-06-15 LAB — LIPID PANEL
CHOL/HDL RATIO: 6 ratio — AB (ref <=5.0)
Cholesterol: 216 mg/dL — ABNORMAL HIGH (ref 125–200)
HDL: 36 mg/dL — AB (ref >=46)
LDL Cholesterol: 150 mg/dL — ABNORMAL HIGH (ref <130)
Triglycerides: 148 mg/dL (ref <150)
VLDL: 30 mg/dL (ref <30)

## 2016-06-15 LAB — COMPREHENSIVE METABOLIC PANEL
ALK PHOS: 63 U/L (ref 33–130)
ALT: 7 U/L (ref 6–29)
AST: 13 U/L (ref 10–35)
Albumin: 3.5 g/dL — ABNORMAL LOW (ref 3.6–5.1)
BILIRUBIN TOTAL: 0.4 mg/dL (ref 0.2–1.2)
BUN: 10 mg/dL (ref 7–25)
CALCIUM: 9 mg/dL (ref 8.6–10.4)
CO2: 27 mmol/L (ref 20–31)
CREATININE: 1.03 mg/dL — AB (ref 0.50–0.99)
Chloride: 102 mmol/L (ref 98–110)
GLUCOSE: 95 mg/dL (ref 65–99)
Potassium: 4.4 mmol/L (ref 3.5–5.3)
SODIUM: 135 mmol/L (ref 135–146)
Total Protein: 6.4 g/dL (ref 6.1–8.1)

## 2016-06-21 ENCOUNTER — Ambulatory Visit (INDEPENDENT_AMBULATORY_CARE_PROVIDER_SITE_OTHER): Payer: 59 | Admitting: Pharmacist Clinician (PhC)/ Clinical Pharmacy Specialist

## 2016-06-21 DIAGNOSIS — E785 Hyperlipidemia, unspecified: Secondary | ICD-10-CM | POA: Diagnosis not present

## 2016-06-21 MED ORDER — EZETIMIBE-SIMVASTATIN 10-40 MG PO TABS: 1.0000 | ORAL_TABLET | Freq: Every day | ORAL | 6 refills | Status: DC

## 2016-06-21 NOTE — Patient Instructions (Signed)
Restart Vytorin 10-40 mg once daily at bedtime.  If you have problems with this please call us and we can adjust the dose.  Will have you repeat labs around the end of December and then determine if we need to start [facility].   Cholesterol Cholesterol is a fat. Your body needs a small amount of cholesterol. Cholesterol may build up in your blood vessels. This increases your chance of having a heart attack or stroke. You cannot feel your cholesterol levels. The only way to know your cholesterol level is high is with a blood test. Keep your test results. Work with your doctor to keep your cholesterol at a good level. WHAT DO THE TEST RESULTS MEAN?  Total cholesterol is how much cholesterol is in your blood.  LDL is bad cholesterol. This is the type that can build up. You want LDL to be low.  HDL is good cholesterol. It cleans your blood vessels and carries LDL away. You want HDL to be high.  Triglycerides are fat that the body can burn for energy or store. WHAT ARE GOOD LEVELS OF CHOLESTEROL?  Total cholesterol below 200.  LDL below 100 for people at risk. Below 70 for those at very high risk.  HDL above 50 is good. Above 60 is best.  Triglycerides below 150. HOW CAN I LOWER MY CHOLESTEROL?  Diet. Follow your diet programs as told by your doctor.  Choose fish, white meat chicken, roasted [place], or baked [place]. Try not to eat red meat, fried foods, or processed meats such as sausage and lunch meats.  Eat lots of fresh fruits and vegetables.  Choose whole grains, beans, pasta, potatoes, and cereals.  Use only small amounts of olive, corn, or canola oils.  Try not to eat butter, mayonnaise, shortening, or palm kernel oils.  Try not to eat foods with trans fats.  Drink skim or nonfat milk. Eat low-fat or nonfat yogurt and cheeses. Try not to drink whole milk or cream. Try not to eat ice cream, egg yolks, and full-fat cheeses.  Healthy desserts include angel food cake, ginger  snaps, animal crackers, hard candy, popsicles, and low-fat or nonfat frozen yogurt. Try not to eat pastries, cakes, pies, and cookies.  Exercise. Follow your exercise programs as told by your doctor.  Be more active. You can try gardening, walking, or taking the stairs. Ask your doctor about how you can be more active.  Medicine. Take medicine as told by your doctor.   This information is not intended to replace advice given to you by your health care provider. Make sure you discuss any questions you have with your health care provider.   Document Released: 11/15/2008 Document Revised: 09/09/2014 Document Reviewed: 06/02/2013 Elsevier Interactive Patient Education [employer].

## 2016-06-21 NOTE — Progress Notes (Signed)
06/21/2016 Eileen Burgess 1956/06/01 [id number]   HPI:  Eileen Burgess is a 60 y.o. female patient of Dr Ellyn Hack, who presents today for a lipid clinic follow up.  I have worked with her in past years and she had some problems with statins and ezetimibe, but is willing to go back on Vytorin, as she believes the muscle problems didn't change significantly without the statins.  Today she returns, hoping to get back on track to lowering her cholesterol.  She has not been on any cholesterol medications for several months and has had a rather hectic life, driving back and forth to her father's be cause of his illness.  They have just moved him to live with her brother nearby.     RF:  MI and PCI stent in 2005  Meds: none   Intolerant: ezetimibe - muscle aches       vytorin - took 2005 post MI until 02/2011 when developed myalgias (leg cramps and weakness);       Pravastatin  40 mg - 03/2012      Niaspan 2006-2011 - caused flushing      Crestor 5 mg - 03/2013 took continual decreasing doses from 5 mg qd down to 5 mg qwk, myalgias continued  Family history: brother MI at 33, PGM MI at 55  Diet: admits to eating poorly with all the time travelling back and forth, has plans to start a "scratch" diet, eating only fresh foods and doing all the preparation herself  Exercise: none, works in pharmacy, 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 Range 10/12/2014 11:19 02/2015 06/2016  Cholesterol Latest Range: 0-200 mg/dL 232 (H) 210 216  Triglycerides Latest Range: <150 mg/dL 111 125 148  HDL Latest Range: >39 mg/dL 37 (L) 45 36  LDL (calc) Latest Range: 0-99 mg/dL 173 (H) 140 150  VLDL Latest Range: 0-40 mg/dL 22    Total CHOL/HDL Ratio Latest Units: Ratio 6.3       Current Outpatient Prescriptions  Medication Sig Dispense Refill  . ALPRAZolam (XANAX) 0.25  MG tablet TAKE 1 TO 2  TABLET DAILY AS NEEDED FOR ANXIETY AND, SLEEP (Patient taking differently: TAKE 1 TO 2  TABLET DAILY AS NEEDED FOR ANXIETY AS NEEDED) 180 tablet 0  . aspirin EC 81 MG tablet Take 81 mg by mouth daily.    . Coenzyme Q10 (CO Q 10) 100 MG CAPS Take 1 capsule by mouth daily.    . hydrochlorothiazide (MICROZIDE) 12.5 MG capsule Take 1 capsule (12.5 mg total) by mouth daily. Please keep up coming appointment for more refills 90 capsule 0  . lisinopril (PRINIVIL,ZESTRIL) 20 MG tablet TAKE 1 TABLET BY MOUTH DAILY 90 tablet 1  . metoprolol succinate (TOPROL-XL) 25 MG 24 hr tablet TAKE 1 TABLET BY MOUTH ONCE DAILY 30 tablet 8  . nitroGLYCERIN (NITROSTAT) 0.4 MG SL tablet Place 1 tablet (0.4 mg total) under the tongue every 5 (five) minutes as needed for chest pain. 25 tablet 3  . omeprazole (PRILOSEC) 20 MG capsule Take 20 mg by mouth daily.    Marland Kitchen PREVIDENT 5000 ENAMEL PROTECT 1.1-5 % PSTE      No current facility-administered medications for this visit.     Allergies  Allergen Reactions  . Crestor [Rosuvastatin] Other (See Comments)    LEGS HURTING  . Pravastatin Other (See Comments)  myalagia  . Shrimp [Shellfish Allergy]   . Vytorin [Ezetimibe-Simvastatin] Other (See Comments)    myalgia    Past Medical History:  Diagnosis Date  . CAD S/P percutaneous coronary angioplasty 2005   PCI to pLAD 99%; 3.0 mm x 38 mm Cypher DES; EF ~45% - distal, septal apical hypokinesis    . Dyslipidemia, goal LDL below 70   . Essential hypertension   . GERD (gastroesophageal reflux disease)   . 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 Insomnia     There were no vitals taken for this visit.  Assessment/Plan: Patient ready to get cholesterol back on track. Will restart with Vytorin 10-40 daily at bedtime.  She will take for 3 months then will have labs repeated in late December.  At that time, should she not be to goal of LDL < 70, will submit request for Repatha.  Patient now  has insurance coverage thru her employer, so cost would be insignificant.  Patient will continue to work on her dietary goals as well.   Will see her after labs drawn.   Tommy Medal PharmD CPP [facility]

## 2016-06-21 NOTE — Assessment & Plan Note (Addendum)
Patient ready to get cholesterol back on track. Will restart with Vytorin 10-40 daily at bedtime.  She will take for 3 months then will have labs repeated in late December.  At that time, should she not be to goal of LDL < 70, will submit request for Repatha.  Patient now has insurance coverage thru her employer, so cost would be insignificant.  Patient will continue to work on her dietary goals as well.   Will see her after labs drawn.

## 2016-08-15 ENCOUNTER — Telehealth: Payer: Self-pay | Admitting: Pharmacist Clinician (PhC)/ Clinical Pharmacy Specialist

## 2016-08-15 ENCOUNTER — Other Ambulatory Visit: Payer: Self-pay | Admitting: Cardiology

## 2016-08-15 DIAGNOSIS — E785 Hyperlipidemia, unspecified: Secondary | ICD-10-CM

## 2016-08-15 NOTE — Telephone Encounter (Signed)
LMOM for patient - will have her draw lipid/hepatic labs in early January and schedule f/u after.  Asked that patient call for appt.  Labs mailed today

## 2016-10-08 ENCOUNTER — Other Ambulatory Visit: Payer: Self-pay | Admitting: Cardiology

## 2016-10-11 ENCOUNTER — Other Ambulatory Visit: Payer: Self-pay | Admitting: Cardiology

## 2016-10-11 LAB — HEPATIC FUNCTION PANEL
ALBUMIN: 3.8 g/dL (ref 3.6–5.1)
ALK PHOS: 65 U/L (ref 33–130)
ALT: 8 U/L (ref 6–29)
AST: 16 U/L (ref 10–35)
BILIRUBIN DIRECT: 0.1 mg/dL (ref <=0.2)
BILIRUBIN TOTAL: 0.5 mg/dL (ref 0.2–1.2)
Indirect Bilirubin: 0.4 mg/dL (ref 0.2–1.2)
Total Protein: 6.8 g/dL (ref 6.1–8.1)

## 2016-10-11 LAB — LIPID PANEL
Cholesterol: 208 mg/dL — ABNORMAL HIGH (ref <200)
HDL: 41 mg/dL — AB (ref >50)
LDL Cholesterol: 141 mg/dL — ABNORMAL HIGH (ref <100)
Total CHOL/HDL Ratio: 5.1 Ratio — ABNORMAL HIGH (ref <5.0)
Triglycerides: 128 mg/dL (ref <150)
VLDL: 26 mg/dL (ref <30)

## 2016-10-22 ENCOUNTER — Telehealth: Payer: Self-pay | Admitting: Pharmacist Clinician (PhC)/ Clinical Pharmacy Specialist

## 2016-10-22 NOTE — Telephone Encounter (Signed)
LMOM for patient to return call.  Was to have restarted vytorin in October, but no improvement in LDL.

## 2016-10-22 NOTE — Telephone Encounter (Signed)
-----   Message from [name], MD sent at 10/21/2016 11:18 AM EST ----- No real appreciable change in lipid levels. Slightly reduced LDL and improve HDL. Normal lipids  She is currently being managed by [facility]. Will defer to our pharmacy team  Glenetta Hew, MD

## 2016-11-05 NOTE — Telephone Encounter (Signed)
LMOM for patient - is she tolerating vytorin?

## 2017-02-26 ENCOUNTER — Other Ambulatory Visit: Payer: Self-pay | Admitting: Cardiology

## 2017-04-08 ENCOUNTER — Other Ambulatory Visit: Payer: Self-pay | Admitting: Cardiology

## 2017-05-07 ENCOUNTER — Other Ambulatory Visit: Payer: Self-pay | Admitting: Cardiology

## 2017-05-21 ENCOUNTER — Telehealth: Payer: Self-pay | Admitting: Cardiology

## 2017-05-21 NOTE — Telephone Encounter (Signed)
Received incoming records from [place] for upcoming appointment on 05/27/17 @ 9:00am with Dr. Ellyn Hack. Records given to [facility] in Medical Records. 05/21/17 ab

## 2017-05-27 ENCOUNTER — Ambulatory Visit (INDEPENDENT_AMBULATORY_CARE_PROVIDER_SITE_OTHER): Payer: 59 | Admitting: Cardiology

## 2017-05-27 ENCOUNTER — Ambulatory Visit: Payer: 59 | Admitting: Cardiology

## 2017-05-27 ENCOUNTER — Encounter: Payer: Self-pay | Admitting: Cardiology

## 2017-05-27 VITALS — BP 114/70 | HR 58 | Ht 63.0 in | Wt 223.0 lb

## 2017-05-27 DIAGNOSIS — E669 Obesity, unspecified: Secondary | ICD-10-CM

## 2017-05-27 DIAGNOSIS — E785 Hyperlipidemia, unspecified: Secondary | ICD-10-CM

## 2017-05-27 DIAGNOSIS — I10 Essential (primary) hypertension: Secondary | ICD-10-CM | POA: Diagnosis not present

## 2017-05-27 DIAGNOSIS — Z9861 Coronary angioplasty status: Secondary | ICD-10-CM

## 2017-05-27 DIAGNOSIS — Z789 Other specified health status: Secondary | ICD-10-CM

## 2017-05-27 DIAGNOSIS — I2510 Atherosclerotic heart disease of native coronary artery without angina pectoris: Secondary | ICD-10-CM

## 2017-05-27 DIAGNOSIS — I252 Old myocardial infarction: Secondary | ICD-10-CM

## 2017-05-27 MED ORDER — METOPROLOL SUCCINATE ER 25 MG PO TB24: 25.0000 mg | ORAL_TABLET | Freq: Every day | ORAL | 3 refills | Status: DC

## 2017-05-27 MED ORDER — NITROGLYCERIN 0.4 MG SL SUBL: SUBLINGUAL_TABLET | SUBLINGUAL | 3 refills | Status: DC

## 2017-05-27 MED ORDER — HYDROCHLOROTHIAZIDE 12.5 MG PO CAPS: 12.5000 mg | ORAL_CAPSULE | Freq: Every day | ORAL | 3 refills | Status: DC

## 2017-05-27 MED ORDER — LISINOPRIL 20 MG PO TABS: 20.0000 mg | ORAL_TABLET | Freq: Every day | ORAL | 3 refills | Status: DC

## 2017-05-27 NOTE — Assessment & Plan Note (Signed)
She lost her way after initially following up with Tommy Medal, RPH-CCP last year. I had Erasmo Downer talk with her again today -- we will recheck another set of lipids for up-to-date panel, and start the process for Repatha versus Praluent.

## 2017-05-27 NOTE — Assessment & Plan Note (Signed)
13 years out from anterior MI with stents to the LAD. No heart failure symptoms or further anginal symptoms. Preserved EF and wall motion on echo with a negative Myoview in follow-up. On beta blocker and ACE inhibitor.

## 2017-05-27 NOTE — Progress Notes (Signed)
PCP: Patient, No Pcp Per  Clinic Note: Chief Complaint  Patient presents with  . Follow-up  . Coronary Artery Disease  . Hyperlipidemia    Statin intolerant    HPI: Eileen Burgess is a 61 y.o. female with a PMH below who presents today for annual f/u for CAD-PCI.. 1. History of acute anterior wall MI --> CAD S/P percutaneous coronary angioplasty - 2005 2. Dyslipidemia, goal LDL below 70 with  Statin intolerance  3.Essential hypertension  4.  Obesity (BMI 30-39.9)   RODNEY WIGGER was last seen on 05/24/2016 -- was going to f/u with Tommy Medal for Lipids - then forgot.  Recent Hospitalizations: n/a  Studies Personally Reviewed - (if available, images/films reviewed: From Epic Chart or Care Everywhere)  None  Interval History: Eileen Burgess returns today overall doing very well without any major complaints. She has been essentially distracted of late because of her father's poor health. She is basically helping her brother and his wife taking care of her father on the weekends while they take care of him during the week. She is now working 30 hours days 4 days a week with the intention of having slowed down at work, but instead is now using the days off to have long weekend to the care of her father. As such, she has less time the take care of herself and is forgotten to come back in to talk about her lipids. She is also not doing as much exercise and has gained weight. She does walk chronic get some exercise, but not nearly the amount is she used to. She overall is pretty stable from cardiac standpoint however and denies any chest tightness or pressure with rest or exertion. No PND, orthopnea. She does note that when she is on her feet for a while, or sitting for a while, she says that her legs ache and she will have some swelling. Usually she walks the aching improves and the swelling improves.  Occasional rare palpitations, but nothing prolonged -- she has been avoiding extra  coffee or caffeine/tea. In doing so she is not having as much the palpitations.. No lightheadedness, dizziness, weakness or syncope/near syncope. No TIA/amaurosis fugax symptoms. No claudication.  ROS: A comprehensive was performed. Review of Systems  Constitutional: Negative for malaise/fatigue.  HENT: Negative for congestion.   Respiratory: Negative for cough, shortness of breath and wheezing.   Cardiovascular: Negative for leg swelling.  Gastrointestinal: Negative for blood in stool, constipation, heartburn and melena.  Genitourinary: Negative for frequency and hematuria.  Musculoskeletal: Negative for joint pain.  Neurological: Negative for dizziness and focal weakness.  Endo/Heme/Allergies: Negative for environmental allergies.  Psychiatric/Behavioral: The patient is not nervous/anxious and does not have insomnia.   All other systems reviewed and are negative.  I have reviewed and (if needed) personally updated the patient's problem list, medications, allergies, past medical and surgical history, social and family history.   Past Medical History:  Diagnosis Date  . CAD S/P percutaneous coronary angioplasty 2005   PCI to pLAD 99%; 3.0 mm x 38 mm Cypher DES; EF ~45% - distal, septal apical hypokinesis    . Dyslipidemia, goal LDL below 70   . Essential hypertension   . GERD (gastroesophageal reflux disease)   . 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 Insomnia     Past Surgical History:  Procedure Laterality Date  . CESAREAN SECTION  05/18/1983  . CORONARY ANGIOPLASTY WITH STENT PLACEMENT  2005   prox LAD 3.0  mm x 28 mm Cypher DES; circumflex is nondominant with 2 large EOMs. Dominant RCA is a large PDA and posterolateral branch.  . NM MYOVIEW LTD  10/2012   8:50 min; 9 METS.  No ischemia or Infarction; EF ~68%  . OVARIAN CYST REMOVAL  12/1968    Current Meds  Medication Sig  . ALPRAZolam (XANAX) 0.25 MG tablet Take 0.25 mg by mouth at bedtime as needed for anxiety.    Marland Kitchen aspirin EC 81 MG tablet Take 81 mg by mouth daily.  Marland Kitchen ezetimibe-simvastatin (VYTORIN) 10-40 MG tablet Take 2 tablets by mouth every 30 (thirty) days.  . hydrochlorothiazide (MICROZIDE) 12.5 MG capsule Take 1 capsule (12.5 mg total) by mouth daily.  Marland Kitchen lisinopril (PRINIVIL,ZESTRIL) 20 MG tablet Take 1 tablet (20 mg total) by mouth daily.  . metoprolol succinate (TOPROL-XL) 25 MG 24 hr tablet Take 1 tablet (25 mg total) by mouth daily.  . nitroGLYCERIN (NITROSTAT) 0.4 MG SL tablet TAKE 1 TABLET UNDER THE TONGUE AS NEEDEDFOR CHEST PAIN (MAY REPEAT EVERY 5 MIN X3 DOSES, CALL 911 IF NO RELIEF.)  . omeprazole (PRILOSEC) 20 MG capsule Take 20 mg by mouth daily.  . [DISCONTINUED] ALPRAZolam (XANAX) 0.25 MG tablet TAKE 1 TO 2  TABLET DAILY AS NEEDED FOR ANXIETY AND, SLEEP (Patient taking differently: TAKE 1 TO 2  TABLET DAILY AS NEEDED FOR ANXIETY AS NEEDED)  . [DISCONTINUED] hydrochlorothiazide (MICROZIDE) 12.5 MG capsule TAKE 1 CAPSULE BY MOUTH DAILY  . [DISCONTINUED] lisinopril (PRINIVIL,ZESTRIL) 20 MG tablet TAKE 1 TABLET BY MOUTH DAILY  . [DISCONTINUED] metoprolol succinate (TOPROL-XL) 25 MG 24 hr tablet TAKE 1 TABLET BY MOUTH ONCE DAILY  . [DISCONTINUED] NITROSTAT 0.4 MG SL tablet TAKE 1 TABLET UNDER THE TONGUE AS NEEDEDFOR CHEST PAIN (MAY REPEAT EVERY 5 MIN X3 DOSES, CALL 911 IF NO RELIEF.)    Allergies  Allergen Reactions  . Crestor [Rosuvastatin] Other (See Comments)    LEGS HURTING  . Pravastatin Other (See Comments)    myalagia  . Shrimp [Shellfish Allergy]   . Vytorin [Ezetimibe-Simvastatin] Other (See Comments)    myalgia   Seen by Tommy Medal, [place] in August 2016 for lipid management.  Tried to restart Vytorin - but unable to tolerate even qod. Statin Intolerant: vytorin - took 2005 post MI until 02/2011 when developed myalgias (leg cramps and weakness);   Pravastatin 40 mg - 03/2012  Niaspan 2006-2011 - caused flushing  Crestor 5 mg - 03/2013 took continual decreasing doses  from 5 mg qd down to 5 mg qwk, myalgias continued;   No longer on Vytorin.  Did not fu/u with lipid clinic b/c got too busy with work & being caregiver for for her father.  Social History   Social History  . Marital status: Married    Spouse name: N/A  . Number of children: N/A  . Years of education: N/A   Social History Main Topics  . Smoking status: Former Smoker    Quit date: 07/03/2004  . Smokeless tobacco: Never Used  . Alcohol use No  . Drug use: No  . Sexual activity: Not Asked   Other Topics Concern  . None   Social History Narrative   Widow.  Works for [employer].   Has been quite active, but no routine exercise.   Quit Smoking in 2005 before her MI.  Does not drink alcohol.   Has now taken on a roll of helping to care for her father who has dementia & is now  s/p MI with MV CAD with CHF - basically on palliative Rx (not CABG candidate.  family history includes Hyperlipidemia in her father and son; Hypertension in her mother; Stroke in her mother.  Wt Readings from Last 3 Encounters:  05/27/17 223 lb (101.2 kg)  05/24/16 218 lb 9.6 oz (99.2 kg)  05/12/15 214 lb 6.4 oz (97.3 kg)  - not as active.    PHYSICAL EXAM BP 114/70   Pulse (!) 58   Ht 5\' 3"  (1.6 m)   Wt 223 lb (101.2 kg)   LMP  (LMP Unknown)   BMI 39.50 kg/m  Physical Exam  Constitutional: She is oriented to person, place, and time. She appears well-developed and well-nourished. No distress.  Healthy appearing. ~Morbidly obese.  Well groomed  HENT:  Head: Normocephalic and atraumatic.  Mouth/Throat: No oropharyngeal exudate.  Eyes: Pupils are equal, round, and reactive to light. EOM are normal. Scleral icterus is present.  Neck: Normal range of motion. Neck supple. No hepatojugular reflux and no JVD present. No thyromegaly present.  Cardiovascular: Normal rate, regular rhythm, normal heart sounds and normal pulses.   No extrasystoles are present. PMI is not displaced.  Exam reveals  no gallop and no distant heart sounds.   No murmur heard. Pulmonary/Chest: Breath sounds normal. No respiratory distress. She has no wheezes. She has no rales.  Abdominal: Soft. Bowel sounds are normal. She exhibits no distension. There is no tenderness. There is no rebound.  Musculoskeletal: Normal range of motion. She exhibits edema (Trivial).  Lymphadenopathy:    She has no cervical adenopathy.  Neurological: She is alert and oriented to person, place, and time.  Skin: Skin is warm and dry. No rash noted. No erythema.  Psychiatric: She has a normal mood and affect. Her behavior is normal. Judgment and thought content normal.  Nursing note and vitals reviewed.   Adult ECG Report  Rate: 58 ;  Rhythm: sinus bradycardia and Otherwise normal axis, intervals and durations;   Narrative Interpretation: Stable EKG   Other studies Reviewed: Additional studies/ records that were reviewed today include:  Recent Labs:    Lab Results  Component Value Date   CHOL 208 (H) 10/11/2016   HDL 41 (L) 10/11/2016   LDLCALC 141 (H) 10/11/2016   TRIG 128 10/11/2016   CHOLHDL 5.1 (H) 10/11/2016    ASSESSMENT / PLAN: Problem List Items Addressed This Visit    CAD S/P percutaneous coronary angioplasty - Primary (Chronic)    Distant PCI back in 2005 with a Cypher DES stent in the LAD. No further anginal symptoms, however she is not very active. She is on aspirin alone without Plavix. She is taking stable dose of Toprol and lisinopril with HCTZ. Not tolerating Vytorin. Did not start CoQ10.  Will restart process for PCSK9 inhibitor therapy.      Relevant Medications   ezetimibe-simvastatin (VYTORIN) 10-40 MG tablet   hydrochlorothiazide (MICROZIDE) 12.5 MG capsule   lisinopril (PRINIVIL,ZESTRIL) 20 MG tablet   metoprolol succinate (TOPROL-XL) 25 MG 24 hr tablet   nitroGLYCERIN (NITROSTAT) 0.4 MG SL tablet   Other Relevant Orders   EKG 12-Lead   Comprehensive metabolic panel   Dyslipidemia, goal  LDL below 70 (Chronic)    She lost her way after initially following up with Tommy Medal, RPH-CCP last year. I had Eileen Burgess talk with her again today -- we will recheck another set of lipids for up-to-date panel, and start the process for Repatha versus Praluent.      Relevant  Medications   ezetimibe-simvastatin (VYTORIN) 10-40 MG tablet   hydrochlorothiazide (MICROZIDE) 12.5 MG capsule   lisinopril (PRINIVIL,ZESTRIL) 20 MG tablet   metoprolol succinate (TOPROL-XL) 25 MG 24 hr tablet   nitroGLYCERIN (NITROSTAT) 0.4 MG SL tablet   Other Relevant Orders   Lipid panel   Comprehensive metabolic panel   Essential hypertension (Chronic)    Well-controlled on current meds. No change      Relevant Medications   ezetimibe-simvastatin (VYTORIN) 10-40 MG tablet   hydrochlorothiazide (MICROZIDE) 12.5 MG capsule   lisinopril (PRINIVIL,ZESTRIL) 20 MG tablet   metoprolol succinate (TOPROL-XL) 25 MG 24 hr tablet   nitroGLYCERIN (NITROSTAT) 0.4 MG SL tablet   History of acute anterior wall MI    13 years out from anterior MI with stents to the LAD. No heart failure symptoms or further anginal symptoms. Preserved EF and wall motion on echo with a negative Myoview in follow-up. On beta blocker and ACE inhibitor.      Relevant Orders   EKG 12-Lead   Obesity (BMI 30-39.9) (Chronic)    Unfortunately, she has gone the wrong direction with weight gain as opposed weight loss. This has to do with lack of physical activity. She needs to get exercise even if she is caring for her father. Also needs to change her diet. We discussed various options.      Relevant Orders   Lipid panel   Comprehensive metabolic panel   Statin intolerance (Chronic)    Moving forward with plan to start PCSK9 inhibitor.      Relevant Orders   Lipid panel   Comprehensive metabolic panel      Current medicines are reviewed at length with the patient today. (+/- concerns) Unable to tolerate Vytorin every other day The  following changes have been made: Tried to restart Vytorin. Referred to Tommy Medal, [facility]  Patient Instructions  NO CHANGE WITH CURRENT MEDICATIONS    LABS- MAY DO AT ANYTIME - FASTING LIPID CMP    Your physician wants you to follow-up in [place].You will receive a reminder letter in the mail two months in advance. If you don't receive a letter, please call our office to schedule the follow-up appointment.   If you need a refill on your cardiac medications before your next appointment, please call your pharmacy.    Studies Ordered:   Orders Placed This Encounter  Procedures  . Lipid panel  . Comprehensive metabolic panel  . EKG 12-Lead      Glenetta Hew, M.D., M.S. Interventional Cardiologist   Pager # [phone] Phone # [phone] [address] [place], [place] [address]

## 2017-05-27 NOTE — Assessment & Plan Note (Signed)
Moving forward with plan to start PCSK9 inhibitor.

## 2017-05-27 NOTE — Assessment & Plan Note (Signed)
Well-controlled on current meds.  No change 

## 2017-05-27 NOTE — Assessment & Plan Note (Signed)
Unfortunately, she has gone the wrong direction with weight gain as opposed weight loss. This has to do with lack of physical activity. She needs to get exercise even if she is caring for her father. Also needs to change her diet. We discussed various options.

## 2017-05-27 NOTE — Patient Instructions (Signed)
NO CHANGE WITH CURRENT MEDICATIONS    LABS- MAY DO AT ANYTIME - FASTING LIPID CMP    Your physician wants you to follow-up in [place].You will receive a reminder letter in the mail two months in advance. If you don't receive a letter, please call our office to schedule the follow-up appointment.   If you need a refill on your cardiac medications before your next appointment, please call your pharmacy.

## 2017-05-27 NOTE — Assessment & Plan Note (Signed)
Distant PCI back in 2005 with a Cypher DES stent in the LAD. No further anginal symptoms, however she is not very active. She is on aspirin alone without Plavix. She is taking stable dose of Toprol and lisinopril with HCTZ. Not tolerating Vytorin. Did not start CoQ10.  Will restart process for PCSK9 inhibitor therapy.

## 2017-05-29 ENCOUNTER — Other Ambulatory Visit: Payer: Self-pay | Admitting: Pharmacist Clinician (PhC)/ Clinical Pharmacy Specialist

## 2017-05-29 MED ORDER — EVOLOCUMAB 140 MG/ML ~~LOC~~ SOAJ: 140.0000 mg | SUBCUTANEOUS | 12 refills | Status: DC

## 2017-06-06 LAB — COMPREHENSIVE METABOLIC PANEL
A/G RATIO: 1.5 (ref 1.2–2.2)
ALT: 7 IU/L (ref 0–32)
AST: 13 IU/L (ref 0–40)
Albumin: 3.8 g/dL (ref 3.6–4.8)
Alkaline Phosphatase: 75 IU/L (ref 39–117)
BUN/Creatinine Ratio: 13 (ref 12–28)
BUN: 14 mg/dL (ref 8–27)
Bilirubin Total: 0.4 mg/dL (ref 0.0–1.2)
CALCIUM: 8.7 mg/dL (ref 8.7–10.3)
CHLORIDE: 97 mmol/L (ref 96–106)
CO2: 21 mmol/L (ref 20–29)
Creatinine, Ser: 1.09 mg/dL — ABNORMAL HIGH (ref 0.57–1.00)
GFR calc Af Amer: 62 mL/min/{1.73_m2} (ref >59)
GFR, EST NON AFRICAN AMERICAN: 54 mL/min/{1.73_m2} — AB (ref >59)
GLUCOSE: 89 mg/dL (ref 65–99)
Globulin, Total: 2.5 g/dL (ref 1.5–4.5)
POTASSIUM: 4.3 mmol/L (ref 3.5–5.2)
Sodium: 136 mmol/L (ref 134–144)
TOTAL PROTEIN: 6.3 g/dL (ref 6.0–8.5)

## 2017-06-06 LAB — LIPID PANEL
CHOL/HDL RATIO: 5.6 ratio — AB (ref 0.0–4.4)
CHOLESTEROL TOTAL: 217 mg/dL — AB (ref 100–199)
HDL: 39 mg/dL — AB (ref >39)
LDL Calculated: 149 mg/dL — ABNORMAL HIGH (ref 0–99)
TRIGLYCERIDES: 146 mg/dL (ref 0–149)
VLDL Cholesterol Cal: 29 mg/dL (ref 5–40)

## 2017-06-18 ENCOUNTER — Other Ambulatory Visit: Payer: Self-pay | Admitting: Pharmacist Clinician (PhC)/ Clinical Pharmacy Specialist

## 2017-07-01 ENCOUNTER — Telehealth: Payer: Self-pay | Admitting: Pharmacist Clinician (PhC)/ Clinical Pharmacy Specialist

## 2017-07-01 MED ORDER — ALIROCUMAB 150 MG/ML ~~LOC~~ SOPN: 150.0000 mg | PEN_INJECTOR | SUBCUTANEOUS | 12 refills | Status: DC

## 2017-07-01 NOTE — Telephone Encounter (Signed)
Insurance requires trial with Praluent - will submit request with this.  Paperwork and Rx sent to [facility]

## 2018-01-07 ENCOUNTER — Other Ambulatory Visit: Payer: Self-pay | Admitting: Pharmacist Clinician (PhC)/ Clinical Pharmacy Specialist

## 2018-01-07 ENCOUNTER — Telehealth: Payer: Self-pay | Admitting: Pharmacist Clinician (PhC)/ Clinical Pharmacy Specialist

## 2018-01-07 DIAGNOSIS — E785 Hyperlipidemia, unspecified: Secondary | ICD-10-CM

## 2018-01-07 NOTE — Telephone Encounter (Signed)
Patient will get labs drawn Friday, can send re-auth request for Praluent at that time.

## 2018-01-09 ENCOUNTER — Other Ambulatory Visit: Payer: Self-pay | Admitting: Cardiology

## 2018-01-15 LAB — HEPATIC FUNCTION PANEL
ALK PHOS: 78 IU/L (ref 39–117)
ALT: 7 IU/L (ref 0–32)
AST: 12 IU/L (ref 0–40)
Albumin: 4 g/dL (ref 3.6–4.8)
BILIRUBIN, DIRECT: 0.06 mg/dL (ref 0.00–0.40)
TOTAL PROTEIN: 6.5 g/dL (ref 6.0–8.5)

## 2018-01-15 LAB — LIPID PANEL
Chol/HDL Ratio: 3.7 ratio (ref 0.0–4.4)
Cholesterol, Total: 134 mg/dL (ref 100–199)
HDL: 36 mg/dL — ABNORMAL LOW (ref >39)
LDL Calculated: 71 mg/dL (ref 0–99)
TRIGLYCERIDES: 135 mg/dL (ref 0–149)
VLDL Cholesterol Cal: 27 mg/dL (ref 5–40)

## 2018-01-27 ENCOUNTER — Telehealth: Payer: Self-pay | Admitting: *Deleted

## 2018-01-27 NOTE — Telephone Encounter (Signed)
Left message tocall back -in regards to lab result

## 2018-01-27 NOTE — Telephone Encounter (Signed)
-----   Message from [name], MD sent at 01/24/2018 12:48 PM EDT ----- Test looks good. Lipid panel looks much better than 7 months ago.  LDL is pretty much getting close to goal at 71 with triglycerides 135.  This seems to be the effect of  Praluent.  Glenetta Hew

## 2018-02-04 ENCOUNTER — Other Ambulatory Visit: Payer: Self-pay | Admitting: Pharmacist Clinician (PhC)/ Clinical Pharmacy Specialist

## 2018-02-04 MED ORDER — ALIROCUMAB 150 MG/ML ~~LOC~~ SOPN: 150.0000 mg | PEN_INJECTOR | SUBCUTANEOUS | 12 refills | Status: DC

## 2018-02-04 NOTE — Telephone Encounter (Signed)
Praluent PA renewed to 01/28/2019.  Sent to [employer] [facility]

## 2018-03-10 ENCOUNTER — Encounter: Payer: Self-pay | Admitting: *Deleted

## 2018-03-11 NOTE — Telephone Encounter (Signed)
LETTER MAILED 03/10/18

## 2018-06-03 ENCOUNTER — Other Ambulatory Visit: Payer: Self-pay | Admitting: Cardiology

## 2018-06-16 ENCOUNTER — Ambulatory Visit: Payer: 59 | Admitting: Cardiology

## 2018-06-16 ENCOUNTER — Encounter: Payer: Self-pay | Admitting: Cardiology

## 2018-06-16 VITALS — BP 106/72 | HR 59 | Ht 64.0 in | Wt 215.8 lb

## 2018-06-16 DIAGNOSIS — Z789 Other specified health status: Secondary | ICD-10-CM | POA: Diagnosis not present

## 2018-06-16 DIAGNOSIS — E785 Hyperlipidemia, unspecified: Secondary | ICD-10-CM

## 2018-06-16 DIAGNOSIS — I2510 Atherosclerotic heart disease of native coronary artery without angina pectoris: Secondary | ICD-10-CM | POA: Diagnosis not present

## 2018-06-16 DIAGNOSIS — Z9861 Coronary angioplasty status: Secondary | ICD-10-CM | POA: Diagnosis not present

## 2018-06-16 DIAGNOSIS — I10 Essential (primary) hypertension: Secondary | ICD-10-CM

## 2018-06-16 DIAGNOSIS — E669 Obesity, unspecified: Secondary | ICD-10-CM

## 2018-06-16 MED ORDER — NITROGLYCERIN 0.4 MG SL SUBL: SUBLINGUAL_TABLET | SUBLINGUAL | 3 refills | Status: DC

## 2018-06-16 NOTE — Progress Notes (Signed)
PCP: Patient, No Pcp Per  Clinic Note: Chief Complaint  Patient presents with  . Follow-up    Annual  . Coronary Artery Disease    With risk factors of hypertension hyperlipidemia, and obesity    HPI: Eileen Burgess is a 62 y.o. female with a PMH of CAD-PCI below who presents today for annual f/u.Marland Kitchen 1. 2005: History of acute anterior wall MI --> CAD S/P PCI - OM 2. Dyslipidemia, goal LDL below 70 with Statin intolerance; followed by [facility]. 3.Essential hypertension  4. Obesity (BMI 30-39.9)   CHEZNEY HUETHER was last seen on 05/27/2017 --   Recent Hospitalizations: n/a  Studies Personally Reviewed - (if available, images/films reviewed: From Epic Chart or Care Everywhere)  none  Interval History: Eileen Burgess returns here today for routine follow-up doing quite well.  She has been asystematic from a cardiac standpoint.  She is just been under a lot of stress lately.  Her family has been sick pretty much having to care for her father who got sick in the fall and now she is having to care for him.  She also then has her son with his wife living with her in the assisted lot of stress. She has to go down to do the weekends to help out to caring for her father who is living with her brother and sister-in-law.  Unfortunately she does not get along with her sister-in-law very well which makes these trips difficult. She is pretty much asymptomatic from cardiac standpoint: No chest pain or shortness of breath with rest or exertion.  No PND, orthopnea or edema.  No palpitations, lightheadedness, dizziness, weakness or syncope/near syncope. No TIA/amaurosis fugax symptoms. No melena, hematochezia, hematuria, or epstaxis. No claudication.  ROS: A comprehensive was performed. Review of Systems  Constitutional: Positive for weight loss (Not as much as she would like, but has dropped some -is back to where she was 2 years ago). Negative for malaise/fatigue.  HENT:  Negative for nosebleeds.   Respiratory: Negative for cough, shortness of breath and wheezing.   Gastrointestinal: Negative for blood in stool and melena.  Genitourinary: Negative for hematuria.  Musculoskeletal: Negative for joint pain and myalgias.  Neurological: Negative for dizziness.  Psychiatric/Behavioral:       Lots of social stress with several family members being sick.  Has not had time to think for herself.  All other systems reviewed and are negative.   I have reviewed and (if needed) personally updated the patient's problem list, medications, allergies, past medical and surgical history, social and family history.   Past Medical History:  Diagnosis Date  . CAD S/P percutaneous coronary angioplasty 2005   PCI to pLAD 99%; 3.0 mm x 38 mm Cypher DES; EF ~45% - distal, septal apical hypokinesis    . Dyslipidemia, goal LDL below 70   . Essential hypertension   . GERD (gastroesophageal reflux disease)   . 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 Insomnia     Past Surgical History:  Procedure Laterality Date  . CESAREAN SECTION  05/18/1983  . CORONARY ANGIOPLASTY WITH STENT PLACEMENT  2005   prox LAD 3.0 mm x 28 mm Cypher DES; circumflex is nondominant with 2 large EOMs. Dominant RCA is a large PDA and posterolateral branch.  . NM MYOVIEW LTD  10/2012   8:50 min; 9 METS.  No ischemia or Infarction; EF ~68%  . OVARIAN CYST REMOVAL  12/1968    Current Meds  Medication Sig  . Alirocumab (PRALUENT) 150 MG/ML SOPN Inject 150 mg into the skin every 14 (fourteen) days.  Marland Kitchen aspirin EC 81 MG tablet Take 81 mg by mouth daily.  . hydrochlorothiazide (MICROZIDE) 12.5 MG capsule TAKE 1 CAPSULE BY MOUTH DAILY  . lisinopril (PRINIVIL,ZESTRIL) 20 MG tablet TAKE 1 TABLET BY MOUTH DAILY  . metoprolol succinate (TOPROL-XL) 25 MG 24 hr tablet TAKE 1 TABLET BY MOUTH DAILY  . nitroGLYCERIN (NITROSTAT) 0.4 MG SL tablet TAKE 1 TABLET UNDER THE TONGUE AS NEEDEDFOR CHEST PAIN UP TO 3 TIMES AN  EPISODE  . omeprazole (PRILOSEC) 20 MG capsule Take 20 mg by mouth daily.  . [DISCONTINUED] nitroGLYCERIN (NITROSTAT) 0.4 MG SL tablet TAKE 1 TABLET UNDER THE TONGUE AS NEEDEDFOR CHEST PAIN (MAY REPEAT EVERY 5 MIN X3 DOSES, CALL 911 IF NO RELIEF.)    Allergies  Allergen Reactions  . Crestor [Rosuvastatin] Other (See Comments)    LEGS HURTING  . Pravastatin Other (See Comments)    myalagia  . Shrimp [Shellfish Allergy]   . Vytorin [Ezetimibe-Simvastatin] Other (See Comments)    myalgia    Social History   Tobacco Use  . Smoking status: Former Smoker    Last attempt to quit: 07/03/2004    Years since quitting: 13.9  . Smokeless tobacco: Never Used  Substance Use Topics  . Alcohol use: No  . Drug use: No   Social History   Social History Narrative   Widow.  Works for [employer].   Has been quite active, but no routine exercise.   Quit Smoking in 2005 before her MI.  Does not drink alcohol.    family history includes Hyperlipidemia in her father and son; Hypertension in her mother; Stroke in her mother.  Wt Readings from Last 3 Encounters:  06/16/18 215 lb 12.8 oz (97.9 kg)  05/27/17 223 lb (101.2 kg)  05/24/16 218 lb 9.6 oz (99.2 kg)    PHYSICAL EXAM BP 106/72   Pulse (!) 59   Ht 5\' 4"  (1.626 m)   Wt 215 lb 12.8 oz (97.9 kg)   LMP  (LMP Unknown)   BMI 37.04 kg/m  Physical Exam  Constitutional: She is oriented to person, place, and time. She appears well-developed and well-nourished. No distress.  Healthy appearing. ~Morbidly obese.  Well groomed   HENT:  Head: Normocephalic and atraumatic.  Neck: Normal range of motion. Neck supple. No hepatojugular reflux and no JVD present. Carotid bruit is not present.  Cardiovascular: Normal rate, regular rhythm, S1 normal, S2 normal, intact distal pulses and normal pulses.  No extrasystoles are present. PMI is not displaced. Exam reveals distant heart sounds. Exam reveals no gallop and no friction rub.  No  murmur heard. Pulmonary/Chest: Effort normal and breath sounds normal. No respiratory distress. She has no wheezes.  Abdominal: Soft. Bowel sounds are normal. She exhibits no distension. There is no tenderness. There is no rebound.  Obese.  Unable to really assess HSM.  Musculoskeletal: Normal range of motion. She exhibits no edema (Trivial).  Neurological: She is alert and oriented to person, place, and time.  Psychiatric: She has a normal mood and affect. Her behavior is normal. Judgment and thought content normal.  Vitals reviewed.    Adult ECG Report  Rate: 59 ;  Rhythm: sinus bradycardia; normal axis, intervals & durations.  Narrative Interpretation: stable EKG.    Other studies Reviewed: Additional studies/ records that were reviewed today include:  Recent Labs: Labs checked this morning. Lab  Results  Component Value Date   CHOL 157 06/16/2018   HDL 42 06/16/2018   LDLCALC 83 06/16/2018   TRIG 162 (H) 06/16/2018   CHOLHDL 3.7 06/16/2018   Lab Results  Component Value Date   CREATININE 1.09 (H) 06/06/2017   BUN 14 06/06/2017   NA 136 06/06/2017   K 4.3 06/06/2017   CL 97 06/06/2017   CO2 21 06/06/2017    ASSESSMENT / PLAN: Problem List Items Addressed This Visit    CAD S/P percutaneous coronary angioplasty - Primary (Chronic)    Distant history of LAD PCI back in 2005.  No recurrent anginal symptoms.  Has been quite stable now for years.  Is only on aspirin without any more Plavix. Is on beta-blocker and ACE inhibitor.  Recently started on Praluent.  Statin intolerant.      Relevant Medications   nitroGLYCERIN (NITROSTAT) 0.4 MG SL tablet   Other Relevant Orders   EKG 12-Lead (Completed)   Lipid panel (Completed)   Hepatic function panel (Completed)   Dyslipidemia, goal LDL below 70 (Chronic)    Labs just checked today.  LDL is 83.  Is on Praluent.  Followed by our Clinical Pharmacist Team on [facility] ([facility]).  This slight bump in  the wrong direction may be related to a little bit of dietary discretion while taking care of her sick family members.  She is now working hard to try to cut out sweets and sodas etc.  Would like to see her lipids continue to drop.  Look like she was well away back in May.      Relevant Medications   nitroGLYCERIN (NITROSTAT) 0.4 MG SL tablet   Other Relevant Orders   Lipid panel (Completed)   Hepatic function panel (Completed)   Essential hypertension (Chronic)    Blood pressure well controlled on HCTZ and lisinopril plus low-dose Toprol.      Relevant Medications   nitroGLYCERIN (NITROSTAT) 0.4 MG SL tablet   Obesity (BMI 30-39.9) (Chronic)    Looks like she is lost the weight back over the past year that she had gained the previous year.  Hopefully she continues in this direction, she would continue to drop and get down below 200pounds by her next visit.  This should help her lipids. She is try to pick up her activity level and exercise level and has made a conscious effort to change her diet.      Statin intolerance (Chronic)   Relevant Orders   Lipid panel (Completed)   Hepatic function panel (Completed)     -- LABS CHECKED THIS AM - REPORTED ABOVE.   Current medicines are reviewed at length with the patient today.  (+/- concerns) n/a The following changes have been made:  n/a  Patient Instructions  Medication Instructions:  CONTINUE CURRENT MEDICATIONS If you need a refill on your cardiac medications before your next appointment, please call your pharmacy.   Lab work: LIPID ,LIVER PROFILE TODAY If you have labs (blood work) drawn today and your tests are completely normal, you will receive your results only by: Marland Kitchen MyChart Message (if you have MyChart) OR . A paper copy in the mail If you have any lab test that is abnormal or we need to change your treatment, we will call you to review the results.  Testing/Procedures: NOT NEEDED  Follow-Up: At [facility], you  and your health needs are our priority.  As part of our continuing mission to provide you with  exceptional heart care, we have created designated Provider Care Teams.  These Care Teams include your primary Cardiologist (physician) and Advanced Practice Providers (APPs -  Physician Assistants and Nurse Practitioners) who all work together to provide you with the care you need, when you need it. You will need a follow up appointment in 12 months-Oct 2020.  Please call our office 2 months in advance to schedule this appointment.  You may see Glenetta Hew, MD or one of the following Advanced Practice Providers on your designated Care Team:   Rosaria Ferries, PA-C . Jory Sims, DNP, ANP  Any Other Special Instructions Will Be Listed Below (If Applicable).       Studies Ordered:   Orders Placed This Encounter  Procedures  . Lipid panel  . Hepatic function panel  . EKG 12-Lead      Glenetta Hew, M.D., M.S. Interventional Cardiologist   Pager # [phone] Phone # [phone] [address] [place], [place] [address]   Thank you for choosing Heartcare at [facility]!!

## 2018-06-16 NOTE — Patient Instructions (Signed)
Medication Instructions:  CONTINUE CURRENT MEDICATIONS If you need a refill on your cardiac medications before your next appointment, please call your pharmacy.   Lab work: LIPID ,LIVER PROFILE TODAY If you have labs (blood work) drawn today and your tests are completely normal, you will receive your results only by: Marland Kitchen MyChart Message (if you have MyChart) OR . A paper copy in the mail If you have any lab test that is abnormal or we need to change your treatment, we will call you to review the results.  Testing/Procedures: NOT NEEDED  Follow-Up: At [facility], you and your health needs are our priority.  As part of our continuing mission to provide you with exceptional heart care, we have created designated Provider Care Teams.  These Care Teams include your primary Cardiologist (physician) and Advanced Practice Providers (APPs -  Physician Assistants and Nurse Practitioners) who all work together to provide you with the care you need, when you need it. You will need a follow up appointment in 12 months-Oct 2020.  Please call our office 2 months in advance to schedule this appointment.  You may see Glenetta Hew, MD or one of the following Advanced Practice Providers on your designated Care Team:   Rosaria Ferries, PA-C . Jory Sims, DNP, ANP  Any Other Special Instructions Will Be Listed Below (If Applicable).

## 2018-06-17 LAB — LIPID PANEL
CHOL/HDL RATIO: 3.7 ratio (ref 0.0–4.4)
Cholesterol, Total: 157 mg/dL (ref 100–199)
HDL: 42 mg/dL (ref >39)
LDL Calculated: 83 mg/dL (ref 0–99)
Triglycerides: 162 mg/dL — ABNORMAL HIGH (ref 0–149)
VLDL Cholesterol Cal: 32 mg/dL (ref 5–40)

## 2018-06-17 LAB — HEPATIC FUNCTION PANEL
ALT: 8 IU/L (ref 0–32)
AST: 10 IU/L (ref 0–40)
Albumin: 4.2 g/dL (ref 3.6–4.8)
Alkaline Phosphatase: 66 IU/L (ref 39–117)
BILIRUBIN, DIRECT: 0.12 mg/dL (ref 0.00–0.40)
Bilirubin Total: 0.3 mg/dL (ref 0.0–1.2)
TOTAL PROTEIN: 6.9 g/dL (ref 6.0–8.5)

## 2018-06-18 ENCOUNTER — Encounter: Payer: Self-pay | Admitting: Cardiology

## 2018-06-18 NOTE — Assessment & Plan Note (Signed)
Labs just checked today.  LDL is 83.  Is on Praluent.  Followed by our Clinical Pharmacist Team on [facility] ([facility]).  This slight bump in the wrong direction may be related to a little bit of dietary discretion while taking care of her sick family members.  She is now working hard to try to cut out sweets and sodas etc.  Would like to see her lipids continue to drop.  Look like she was well away back in May.

## 2018-06-18 NOTE — Assessment & Plan Note (Signed)
Blood pressure well controlled on HCTZ and lisinopril plus low-dose Toprol.

## 2018-06-18 NOTE — Assessment & Plan Note (Signed)
Distant history of LAD PCI back in 2005.  No recurrent anginal symptoms.  Has been quite stable now for years.  Is only on aspirin without any more Plavix. Is on beta-blocker and ACE inhibitor.  Recently started on Praluent.  Statin intolerant.

## 2018-06-18 NOTE — Assessment & Plan Note (Signed)
Looks like she is lost the weight back over the past year that she had gained the previous year.  Hopefully she continues in this direction, she would continue to drop and get down below 200pounds by her next visit.  This should help her lipids. She is try to pick up her activity level and exercise level and has made a conscious effort to change her diet.

## 2019-04-21 ENCOUNTER — Telehealth: Payer: Self-pay

## 2019-04-21 DIAGNOSIS — E785 Hyperlipidemia, unspecified: Secondary | ICD-10-CM

## 2019-04-21 NOTE — Telephone Encounter (Signed)
Called pt to notify them that they need labs for a lipid pa and the pt stated they have been w/out their med praluent for 2 months. We are awaiting updated lipid, Dr Ellyn Hack to sign

## 2019-04-22 LAB — LIPID PANEL
Chol/HDL Ratio: 5.6 ratio — ABNORMAL HIGH (ref 0.0–4.4)
Cholesterol, Total: 222 mg/dL — ABNORMAL HIGH (ref 100–199)
HDL: 40 mg/dL (ref >39)
LDL Calculated: 151 mg/dL — ABNORMAL HIGH (ref 0–99)
Triglycerides: 157 mg/dL — ABNORMAL HIGH (ref 0–149)
VLDL Cholesterol Cal: 31 mg/dL (ref 5–40)

## 2019-06-02 ENCOUNTER — Telehealth: Payer: Self-pay

## 2019-06-02 MED ORDER — PRALUENT 150 MG/ML ~~LOC~~ SOAJ: 150.0000 mg | SUBCUTANEOUS | 11 refills | Status: DC

## 2019-06-02 NOTE — Telephone Encounter (Signed)
Called and lmomed the pt regarding the approval of the praluent and rx sent instructed the pt to call us back if they have any questions

## 2019-06-09 ENCOUNTER — Other Ambulatory Visit: Payer: Self-pay | Admitting: Cardiology

## 2019-06-24 ENCOUNTER — Other Ambulatory Visit: Payer: Self-pay

## 2019-06-24 ENCOUNTER — Ambulatory Visit: Payer: Medicare Other | Admitting: Cardiology

## 2019-06-24 ENCOUNTER — Encounter: Payer: Self-pay | Admitting: Cardiology

## 2019-06-24 VITALS — BP 118/66 | HR 58 | Ht 64.0 in | Wt 216.0 lb

## 2019-06-24 DIAGNOSIS — I10 Essential (primary) hypertension: Secondary | ICD-10-CM | POA: Diagnosis not present

## 2019-06-24 DIAGNOSIS — Z789 Other specified health status: Secondary | ICD-10-CM

## 2019-06-24 DIAGNOSIS — I2510 Atherosclerotic heart disease of native coronary artery without angina pectoris: Secondary | ICD-10-CM

## 2019-06-24 DIAGNOSIS — E669 Obesity, unspecified: Secondary | ICD-10-CM

## 2019-06-24 DIAGNOSIS — E785 Hyperlipidemia, unspecified: Secondary | ICD-10-CM

## 2019-06-24 DIAGNOSIS — Z9861 Coronary angioplasty status: Secondary | ICD-10-CM | POA: Diagnosis not present

## 2019-06-24 NOTE — Progress Notes (Signed)
PCP: Patient, No Pcp Per  Clinic Note: Chief Complaint  Patient presents with  . Follow-up    Annual, no major symptoms  . Coronary Artery Disease     HPI:   Eileen Burgess is a 63 y.o. female with CAD-PCI noted below who presents today for annual f/u..   2005: History of acute anterior wall MI --> CAD S/P PCI - OM  Dyslipidemia, goal LDL below 70 with Statin intolerance; followed by [facility].  Eileen Burgess was last seen on 06/17/2019 - doing well.  Lots of stress (father was ill).  Recent Hospitalizations: None  Reviewed  CV studies:   The following studies were reviewed today: (if available, images/films reviewed: From Epic Chart or Care Everywhere) . None:   Interval History:   Eileen Burgess returns today overall doing fairly well.  She has not really had any anginal symptoms to speak of.  She had some issues with getting her Praluent, but apparently is now just getting the prescription refilled.  She retired in July and that is taken away a lot of her stress.  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  This has as much to do with walking her father's health began to fail and having to deal with the fact that she is not in control in that situation.  Sometimes she has had anxiety attacks simply thinking about the perspective of going to spend the weekend with him.  The most recent episode about a month ago she had a pretty significant anxiety attack, and when she made the decision not to go, she felt better.  The stress was all relieved and she felt better. Otherwise, she admittedly is not all that active, but denies any active cardiac symptoms.  Cardiovascular review of symptoms:  no chest pain or dyspnea on exertion positive for - Increased heart rate episodes with anxiety attacks, but otherwise no real arrhythmias. negative for - edema, irregular heartbeat, orthopnea, paroxysmal nocturnal dyspnea, shortness of  breath or Syncope/near syncope, TIA/amaurosis fugax, claudication  The patient does not have symptoms concerning for COVID-19 infection (fever, chills, cough, or new shortness of breath).  The patient is practicing social distancing. ++ Masking.  She does go out for groceries/shopping.  Recently retired from work.   REVIEWED OF SYSTEMS   ROS: A comprehensive was performed. Review of Systems  Constitutional: Negative for malaise/fatigue (Just has lack of desire to get out and do much) and weight loss (Able to maintain stable weight, but has not lost anymore.).  HENT: Negative for congestion and nosebleeds.        Does have some allergy symptoms every now and then, but not so much right now  Respiratory: Negative for cough and shortness of breath.   Cardiovascular: Negative for claudication.  Gastrointestinal: Negative for blood in stool, heartburn and melena.  Genitourinary: Negative for hematuria.  Musculoskeletal: Negative for falls and joint pain.  Neurological: Positive for tingling (When she has a panic attack). Negative for dizziness, focal weakness and headaches.  Endo/Heme/Allergies: Positive for environmental allergies. Does not bruise/bleed easily.  Psychiatric/Behavioral: Negative for depression and memory loss. The patient is nervous/anxious (At least one anxiety attack in the last month or so, but otherwise has been stable.  Once she realized that she decided not to go to see her father for the weekend, her symptoms resolved.).        Under much less stress now.  She  retired in July.  Happy not to deal with all of the drama at work. She has a more routine plan for helping to care for her father.  Just as frequent run-ins with brother's wife.   I have reviewed and (if needed) personally updated the patient's problem list, medications, allergies, past medical and surgical history, social and family history.   PAST MEDICAL HISTORY   Past Medical History:  Diagnosis Date  . CAD  S/P percutaneous coronary angioplasty 2005   PCI to pLAD 99%; 3.0 mm x 38 mm Cypher DES; EF ~45% - distal, septal apical hypokinesis    . Dyslipidemia, goal LDL below 70   . Essential hypertension   . GERD (gastroesophageal reflux disease)   . 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 Insomnia     PAST SURGICAL HISTORY   Past Surgical History:  Procedure Laterality Date  . CESAREAN SECTION  05/18/1983  . CORONARY ANGIOPLASTY WITH STENT PLACEMENT  2005   prox LAD 3.0 mm x 28 mm Cypher DES; circumflex is nondominant with 2 large EOMs. Dominant RCA is a large PDA and posterolateral branch.  . NM MYOVIEW LTD  10/2012   8:50 min; 9 METS.  No ischemia or Infarction; EF ~68%  . OVARIAN CYST REMOVAL  12/1968     MEDICATIONS/ALLERGIES   Current Meds  Medication Sig  . Alirocumab (PRALUENT) 150 MG/ML SOAJ Inject 150 mg into the skin every 14 (fourteen) days.  Marland Kitchen aspirin EC 81 MG tablet Take 81 mg by mouth daily.  . hydrochlorothiazide (MICROZIDE) 12.5 MG capsule TAKE 1 CAPSULE BY MOUTH DAILY  . lisinopril (ZESTRIL) 20 MG tablet TAKE 1 TABLET BY MOUTH DAILY  . metoprolol succinate (TOPROL-XL) 25 MG 24 hr tablet TAKE 1 TABLET BY MOUTH DAILY  . nitroGLYCERIN (NITROSTAT) 0.4 MG SL tablet TAKE 1 TABLET UNDER THE TONGUE AS NEEDEDFOR CHEST PAIN UP TO 3 TIMES AN EPISODE  . omeprazole (PRILOSEC) 20 MG capsule Take 20 mg by mouth daily.    Allergies  Allergen Reactions  . Crestor [Rosuvastatin] Other (See Comments)    LEGS HURTING  . Pravastatin Other (See Comments)    myalagia  . Shrimp [Shellfish Allergy]   . Vytorin [Ezetimibe-Simvastatin] Other (See Comments)    myalgia    SOCIAL HISTORY/FAMILY HISTORY   Social History   Tobacco Use  . Smoking status: Former Smoker    Quit date: 07/03/2004    Years since quitting: 14.9  . Smokeless tobacco: Never Used  Substance Use Topics  . Alcohol use: No  . Drug use: No   Social History   Social History Narrative   Widowed.    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   Has been quite active, but no routine exercise.   Quit Smoking in 2005 before her MI.  Does not drink alcohol.    family history includes Hyperlipidemia in her father and son; Hypertension in her mother; Stroke in her mother.   OBJCTIVE -PE, EKG, labs   Wt Readings from Last 3 Encounters:  06/24/19 216 lb (98 kg)  06/16/18 215 lb 12.8 oz (97.9 kg)  05/27/17 223 lb (101.2 kg)    Physical Exam: BP 118/66   Pulse (!) 58   Ht 5\' 4"  (1.626 m)   Wt 216 lb (98 kg)   LMP  (LMP Unknown)   BMI 37.08 kg/m  Physical Exam  Constitutional: She is oriented to  person, place, and time. She appears well-developed and well-nourished. No distress.  HENT:  Head: Normocephalic and atraumatic.  Neck: Normal range of motion. Neck supple. Normal carotid pulses and no JVD present. Carotid bruit is not present.  Cardiovascular: Normal rate, regular rhythm, S1 normal, S2 normal and intact distal pulses.  No extrasystoles are present. PMI is not displaced (Unable to palpate). Exam reveals distant heart sounds. Exam reveals no gallop and no friction rub.  No murmur heard. Pulmonary/Chest: Effort normal and breath sounds normal. No respiratory distress. She has no wheezes. She has no rales.  Abdominal: Soft. Bowel sounds are normal. She exhibits no distension. There is no abdominal tenderness. There is no rebound.  Obese, unable to assess HSM  Musculoskeletal: Normal range of motion.        General: Edema (Trivial) present.  Neurological: She is alert and oriented to person, place, and time.  Psychiatric: She has a normal mood and affect. Judgment normal.  Seems to still have a little bit of a labile anxiety/emotional changes.  Vitals reviewed.   Adult ECG Report  Rate: 58 ;  Rhythm: sinus bradycardia, sinus arrhythmia and Normal axis, intervals and durations.;   Narrative Interpretation: Normal EKG   Recent Labs:   Lab Results  Component Value Date   CHOL 222 (H) 04/22/2019   HDL 40 04/22/2019   LDLCALC 151 (H) 04/22/2019   TRIG 157 (H) 04/22/2019   CHOLHDL 5.6 (H) 04/22/2019  Has been off of Praluent.  Now finally back on it.  Lab Results  Component Value Date   CREATININE 1.09 (H) 06/06/2017   BUN 14 06/06/2017   NA 136 06/06/2017   K 4.3 06/06/2017   CL 97 06/06/2017   CO2 21 06/06/2017    ASSESSMENT/PLAN    Problem List Items Addressed This Visit    CAD S/P percutaneous coronary angioplasty - Primary (Chronic)    Distant history of CAD-PCI to the LAD.  Is now on aspirin alone.  She is on a beta-blocker and ACE inhibitor. Not on statin because of intolerance.  Back on Praluent now.      Relevant Orders   EKG 12-Lead (Completed)   Comprehensive metabolic panel   Comprehensive metabolic panel   Statin intolerance (Chronic)    Back on Praluent now.  Did not tolerate even low-dose statin      Relevant Orders   Lipid panel   Comprehensive metabolic panel   Lipid panel   Comprehensive metabolic panel   Dyslipidemia, goal LDL below 70 (Chronic)    LDL almost got the goal on Praluent.  Unfortunately she had issues with getting established on Praluent.  Is now back on it.  I actually just got it refilled to start this month.  As such, we would need to be checked lipids in about 4 months to reassess.  Hopefully she will get back in the right direction.  We again talked about dietary discretion.  We will probably need to recheck lipids in February to see how she does on restart Repatha and then again prior to annual follow-up.      Relevant Orders   Lipid panel   Comprehensive metabolic panel   Lipid panel   Comprehensive metabolic panel   Obesity (BMI 30-39.9) (Chronic)    She had done a good job losing weight, at least she is maintaining stable weights, but has lost ground with weight loss.  Encouraged her to continue to increase her activity level and watch  her diet.  Essential hypertension (Chronic)    Blood pressure looks well-controlled today. Continue current dose of lisinopril with HCTZ along with low-dose Toprol.      Relevant Orders   EKG 12-Lead (Completed)       COVID-19 Education: The signs and symptoms of COVID-19 were discussed with the patient and how to seek care for testing (follow up with PCP or arrange E-visit).   The importance of social distancing was discussed today.  I spent a total of 3minutes with the patient and chart review. >  50% of the time was spent in direct patient consultation.  Additional time spent with chart review (studies, outside notes, etc): 5 Total Time: 28 min   Current medicines are reviewed at length with the patient today.  (+/- concerns) none   Patient Instructions / Medication Changes & Studies & Tests Ordered   Patient Instructions  Medication Instructions:  NO CHANGES  *If you need a refill on your cardiac medications before your next appointment, please call your pharmacy*  Lab Work:  SCHEDULE FOR FEB 2021  And OCT  2021 WILL [facility] [place]. Fasting lipids  CMP    If you have labs (blood work) drawn today and your tests are completely normal, you will receive your results only by: Marland Kitchen MyChart Message (if you have MyChart) OR . A paper copy in the mail If you have any lab test that is abnormal or we need to change your treatment, we will call you to review the results.  Testing/Procedures: Not needed  Follow-Up: At [facility], you and your health needs are our priority.  As part of our continuing mission to provide you with exceptional heart care, we have created designated Provider Care Teams.  These Care Teams include your primary Cardiologist (physician) and Advanced Practice Providers (APPs -  Physician Assistants and Nurse Practitioners) who all work together to provide you with the care you need, when you need it.  Your next appointment:   12 months  The  format for your next appointment:   In Person  Provider:   Glenetta Hew, MD  Other Instructions    Studies Ordered:   Orders Placed This Encounter  Procedures  . Lipid panel  . Comprehensive metabolic panel  . Lipid panel  . Comprehensive metabolic panel  . EKG 12-Lead     Glenetta Hew, M.D., M.S. Interventional Cardiologist   Pager # [phone] Phone # [phone] [address] [place], [place] [address]   Thank you for choosing Heartcare at [facility]!!

## 2019-06-24 NOTE — Patient Instructions (Signed)
Medication Instructions:  NO CHANGES  *If you need a refill on your cardiac medications before your next appointment, please call your pharmacy*  Lab Work:  SCHEDULE FOR FEB 2021  And OCT  2021 WILL MAIL [place]. Fasting lipids  CMP    If you have labs (blood work) drawn today and your tests are completely normal, you will receive your results only by: Marland Kitchen MyChart Message (if you have MyChart) OR . A paper copy in the mail If you have any lab test that is abnormal or we need to change your treatment, we will call you to review the results.  Testing/Procedures: Not needed  Follow-Up: At [facility], you and your health needs are our priority.  As part of our continuing mission to provide you with exceptional heart care, we have created designated Provider Care Teams.  These Care Teams include your primary Cardiologist (physician) and Advanced Practice Providers (APPs -  Physician Assistants and Nurse Practitioners) who all work together to provide you with the care you need, when you need it.  Your next appointment:   12 months  The format for your next appointment:   In Person  Provider:   Glenetta Hew, MD  Other Instructions

## 2019-06-26 ENCOUNTER — Encounter: Payer: Self-pay | Admitting: Cardiology

## 2019-06-26 NOTE — Assessment & Plan Note (Signed)
Distant history of CAD-PCI to the LAD.  Is now on aspirin alone.  She is on a beta-blocker and ACE inhibitor. Not on statin because of intolerance.  Back on Praluent now.

## 2019-06-26 NOTE — Assessment & Plan Note (Signed)
She had done a good job losing weight, at least she is maintaining stable weights, but has lost ground with weight loss.  Encouraged her to continue to increase her activity level and watch her diet.

## 2019-06-26 NOTE — Assessment & Plan Note (Signed)
Back on Praluent now.  Did not tolerate even low-dose statin

## 2019-06-26 NOTE — Assessment & Plan Note (Signed)
Blood pressure looks well-controlled today. Continue current dose of lisinopril with HCTZ along with low-dose Toprol.

## 2019-06-26 NOTE — Assessment & Plan Note (Signed)
LDL almost got the goal on Praluent.  Unfortunately she had issues with getting established on Praluent.  Is now back on it.  I actually just got it refilled to start this month.  As such, we would need to be checked lipids in about 4 months to reassess.  Hopefully she will get back in the right direction.  We again talked about dietary discretion.  We will probably need to recheck lipids in February to see how she does on restart Repatha and then again prior to annual follow-up.

## 2019-08-06 ENCOUNTER — Telehealth: Payer: Self-pay | Admitting: Cardiology

## 2019-08-06 NOTE — Telephone Encounter (Signed)
Andrian from [employer] is calling in regards to patients Statin status. If patient is taking medication the need the dosage and how often. Please Advise.

## 2019-08-06 NOTE — Telephone Encounter (Signed)
Left message for [facility], patient is currently taking praluent injections and is due for follow up labs anytime now. They are to call back with further questions.

## 2019-08-31 ENCOUNTER — Other Ambulatory Visit: Payer: Self-pay | Admitting: *Deleted

## 2019-08-31 MED ORDER — HYDROCHLOROTHIAZIDE 12.5 MG PO CAPS: 12.5000 mg | ORAL_CAPSULE | Freq: Every day | ORAL | 1 refills | Status: DC

## 2019-08-31 MED ORDER — LISINOPRIL 20 MG PO TABS: 20.0000 mg | ORAL_TABLET | Freq: Every day | ORAL | 1 refills | Status: DC

## 2019-09-08 ENCOUNTER — Other Ambulatory Visit: Payer: Self-pay

## 2019-09-08 MED ORDER — METOPROLOL SUCCINATE ER 25 MG PO TB24: 25.0000 mg | ORAL_TABLET | Freq: Every day | ORAL | 3 refills | Status: DC

## 2020-03-07 ENCOUNTER — Other Ambulatory Visit: Payer: Self-pay | Admitting: Cardiology

## 2020-04-13 ENCOUNTER — Other Ambulatory Visit: Payer: Self-pay

## 2020-04-13 ENCOUNTER — Emergency Department (HOSPITAL_COMMUNITY): Payer: Medicare Other

## 2020-04-13 ENCOUNTER — Inpatient Hospital Stay (HOSPITAL_COMMUNITY)
Admission: EM | Admit: 2020-04-13 | Discharge: 2020-05-11 | DRG: 744 | Disposition: A | Discharge status: Hospice | Payer: Medicare Other | Attending: Family Medicine | Admitting: Family Medicine

## 2020-04-13 ENCOUNTER — Encounter (HOSPITAL_COMMUNITY): Payer: Self-pay | Admitting: Emergency Medicine

## 2020-04-13 DIAGNOSIS — N390 Urinary tract infection, site not specified: Secondary | ICD-10-CM | POA: Diagnosis present

## 2020-04-13 DIAGNOSIS — Z66 Do not resuscitate: Secondary | ICD-10-CM | POA: Diagnosis not present

## 2020-04-13 DIAGNOSIS — N179 Acute kidney failure, unspecified: Secondary | ICD-10-CM | POA: Diagnosis not present

## 2020-04-13 DIAGNOSIS — K56609 Unspecified intestinal obstruction, unspecified as to partial versus complete obstruction: Secondary | ICD-10-CM

## 2020-04-13 DIAGNOSIS — Z6839 Body mass index (BMI) 39.0-39.9, adult: Secondary | ICD-10-CM

## 2020-04-13 DIAGNOSIS — E861 Hypovolemia: Secondary | ICD-10-CM | POA: Diagnosis present

## 2020-04-13 DIAGNOSIS — R2681 Unsteadiness on feet: Secondary | ICD-10-CM | POA: Diagnosis present

## 2020-04-13 DIAGNOSIS — K5669 Other partial intestinal obstruction: Secondary | ICD-10-CM | POA: Diagnosis not present

## 2020-04-13 DIAGNOSIS — Z955 Presence of coronary angioplasty implant and graft: Secondary | ICD-10-CM

## 2020-04-13 DIAGNOSIS — Z9889 Other specified postprocedural states: Secondary | ICD-10-CM

## 2020-04-13 DIAGNOSIS — F419 Anxiety disorder, unspecified: Secondary | ICD-10-CM | POA: Diagnosis present

## 2020-04-13 DIAGNOSIS — E43 Unspecified severe protein-calorie malnutrition: Secondary | ICD-10-CM | POA: Diagnosis present

## 2020-04-13 DIAGNOSIS — R180 Malignant ascites: Secondary | ICD-10-CM | POA: Diagnosis present

## 2020-04-13 DIAGNOSIS — D61810 Antineoplastic chemotherapy induced pancytopenia: Secondary | ICD-10-CM | POA: Diagnosis not present

## 2020-04-13 DIAGNOSIS — Z20822 Contact with and (suspected) exposure to covid-19: Secondary | ICD-10-CM | POA: Diagnosis present

## 2020-04-13 DIAGNOSIS — T502X5A Adverse effect of carbonic-anhydrase inhibitors, benzothiadiazides and other diuretics, initial encounter: Secondary | ICD-10-CM | POA: Diagnosis present

## 2020-04-13 DIAGNOSIS — N131 Hydronephrosis with ureteral stricture, not elsewhere classified: Secondary | ICD-10-CM | POA: Diagnosis present

## 2020-04-13 DIAGNOSIS — J9601 Acute respiratory failure with hypoxia: Secondary | ICD-10-CM | POA: Diagnosis not present

## 2020-04-13 DIAGNOSIS — E871 Hypo-osmolality and hyponatremia: Secondary | ICD-10-CM | POA: Diagnosis not present

## 2020-04-13 DIAGNOSIS — R1110 Vomiting, unspecified: Secondary | ICD-10-CM

## 2020-04-13 DIAGNOSIS — E875 Hyperkalemia: Secondary | ICD-10-CM | POA: Diagnosis not present

## 2020-04-13 DIAGNOSIS — Z888 Allergy status to other drugs, medicaments and biological substances status: Secondary | ICD-10-CM

## 2020-04-13 DIAGNOSIS — Z7401 Bed confinement status: Secondary | ICD-10-CM

## 2020-04-13 DIAGNOSIS — N170 Acute kidney failure with tubular necrosis: Secondary | ICD-10-CM | POA: Diagnosis present

## 2020-04-13 DIAGNOSIS — T451X5A Adverse effect of antineoplastic and immunosuppressive drugs, initial encounter: Secondary | ICD-10-CM | POA: Diagnosis not present

## 2020-04-13 DIAGNOSIS — R1915 Other abnormal bowel sounds: Secondary | ICD-10-CM

## 2020-04-13 DIAGNOSIS — I2510 Atherosclerotic heart disease of native coronary artery without angina pectoris: Secondary | ICD-10-CM | POA: Diagnosis present

## 2020-04-13 DIAGNOSIS — Z515 Encounter for palliative care: Secondary | ICD-10-CM | POA: Diagnosis not present

## 2020-04-13 DIAGNOSIS — Z83438 Family history of other disorder of lipoprotein metabolism and other lipidemia: Secondary | ICD-10-CM

## 2020-04-13 DIAGNOSIS — Z0189 Encounter for other specified special examinations: Secondary | ICD-10-CM

## 2020-04-13 DIAGNOSIS — I1310 Hypertensive heart and chronic kidney disease without heart failure, with stage 1 through stage 4 chronic kidney disease, or unspecified chronic kidney disease: Secondary | ICD-10-CM | POA: Diagnosis present

## 2020-04-13 DIAGNOSIS — K219 Gastro-esophageal reflux disease without esophagitis: Secondary | ICD-10-CM | POA: Diagnosis present

## 2020-04-13 DIAGNOSIS — C539 Malignant neoplasm of cervix uteri, unspecified: Secondary | ICD-10-CM | POA: Diagnosis not present

## 2020-04-13 DIAGNOSIS — J918 Pleural effusion in other conditions classified elsewhere: Secondary | ICD-10-CM | POA: Diagnosis not present

## 2020-04-13 DIAGNOSIS — N3000 Acute cystitis without hematuria: Secondary | ICD-10-CM | POA: Diagnosis not present

## 2020-04-13 DIAGNOSIS — E669 Obesity, unspecified: Secondary | ICD-10-CM | POA: Diagnosis present

## 2020-04-13 DIAGNOSIS — I951 Orthostatic hypotension: Secondary | ICD-10-CM | POA: Diagnosis present

## 2020-04-13 DIAGNOSIS — E8770 Fluid overload, unspecified: Secondary | ICD-10-CM

## 2020-04-13 DIAGNOSIS — R188 Other ascites: Secondary | ICD-10-CM

## 2020-04-13 DIAGNOSIS — Z5111 Encounter for antineoplastic chemotherapy: Secondary | ICD-10-CM

## 2020-04-13 DIAGNOSIS — I10 Essential (primary) hypertension: Secondary | ICD-10-CM | POA: Diagnosis present

## 2020-04-13 DIAGNOSIS — I252 Old myocardial infarction: Secondary | ICD-10-CM

## 2020-04-13 DIAGNOSIS — Z4659 Encounter for fitting and adjustment of other gastrointestinal appliance and device: Secondary | ICD-10-CM

## 2020-04-13 DIAGNOSIS — Z87891 Personal history of nicotine dependence: Secondary | ICD-10-CM

## 2020-04-13 DIAGNOSIS — R627 Adult failure to thrive: Secondary | ICD-10-CM | POA: Diagnosis present

## 2020-04-13 DIAGNOSIS — J90 Pleural effusion, not elsewhere classified: Secondary | ICD-10-CM

## 2020-04-13 DIAGNOSIS — N1831 Chronic kidney disease, stage 3a: Secondary | ICD-10-CM | POA: Diagnosis present

## 2020-04-13 DIAGNOSIS — R0602 Shortness of breath: Secondary | ICD-10-CM

## 2020-04-13 DIAGNOSIS — Z8249 Family history of ischemic heart disease and other diseases of the circulatory system: Secondary | ICD-10-CM

## 2020-04-13 DIAGNOSIS — Z79899 Other long term (current) drug therapy: Secondary | ICD-10-CM

## 2020-04-13 DIAGNOSIS — Z823 Family history of stroke: Secondary | ICD-10-CM

## 2020-04-13 DIAGNOSIS — N3001 Acute cystitis with hematuria: Secondary | ICD-10-CM

## 2020-04-13 DIAGNOSIS — E785 Hyperlipidemia, unspecified: Secondary | ICD-10-CM | POA: Diagnosis present

## 2020-04-13 DIAGNOSIS — Z7189 Other specified counseling: Secondary | ICD-10-CM

## 2020-04-13 DIAGNOSIS — N3941 Urge incontinence: Secondary | ICD-10-CM | POA: Diagnosis present

## 2020-04-13 DIAGNOSIS — K567 Ileus, unspecified: Secondary | ICD-10-CM

## 2020-04-13 DIAGNOSIS — E860 Dehydration: Secondary | ICD-10-CM | POA: Diagnosis present

## 2020-04-13 DIAGNOSIS — C786 Secondary malignant neoplasm of retroperitoneum and peritoneum: Secondary | ICD-10-CM

## 2020-04-13 DIAGNOSIS — J810 Acute pulmonary edema: Secondary | ICD-10-CM | POA: Diagnosis not present

## 2020-04-13 DIAGNOSIS — E872 Acidosis: Secondary | ICD-10-CM | POA: Diagnosis not present

## 2020-04-13 LAB — CBC
HCT: 38.3 % (ref 36.0–46.0)
Hemoglobin: 12.7 g/dL (ref 12.0–15.0)
MCH: 27.5 pg (ref 26.0–34.0)
MCHC: 33.2 g/dL (ref 30.0–36.0)
MCV: 83.1 fL (ref 80.0–100.0)
Platelets: 325 10*3/uL (ref 150–400)
RBC: 4.61 MIL/uL (ref 3.87–5.11)
RDW: 13.1 % (ref 11.5–15.5)
WBC: 7.7 10*3/uL (ref 4.0–10.5)
nRBC: 0 % (ref 0.0–0.2)

## 2020-04-13 LAB — CBC WITH DIFFERENTIAL/PLATELET
Abs Immature Granulocytes: 0.04 10*3/uL (ref 0.00–0.07)
Basophils Absolute: 0 10*3/uL (ref 0.0–0.1)
Basophils Relative: 1 %
Eosinophils Absolute: 0 10*3/uL (ref 0.0–0.5)
Eosinophils Relative: 0 %
HCT: 39.3 % (ref 36.0–46.0)
Hemoglobin: 13.4 g/dL (ref 12.0–15.0)
Immature Granulocytes: 1 %
Lymphocytes Relative: 9 %
Lymphs Abs: 0.8 10*3/uL (ref 0.7–4.0)
MCH: 27.6 pg (ref 26.0–34.0)
MCHC: 34.1 g/dL (ref 30.0–36.0)
MCV: 81 fL (ref 80.0–100.0)
Monocytes Absolute: 0.9 10*3/uL (ref 0.1–1.0)
Monocytes Relative: 11 %
Neutro Abs: 7 10*3/uL (ref 1.7–7.7)
Neutrophils Relative %: 78 %
Platelets: 347 10*3/uL (ref 150–400)
RBC: 4.85 MIL/uL (ref 3.87–5.11)
RDW: 13.1 % (ref 11.5–15.5)
WBC: 8.8 10*3/uL (ref 4.0–10.5)
nRBC: 0 % (ref 0.0–0.2)

## 2020-04-13 LAB — COMPREHENSIVE METABOLIC PANEL
ALT: 12 U/L (ref 0–44)
AST: 14 U/L — ABNORMAL LOW (ref 15–41)
Albumin: 3.4 g/dL — ABNORMAL LOW (ref 3.5–5.0)
Alkaline Phosphatase: 65 U/L (ref 38–126)
Anion gap: 11 (ref 5–15)
BUN: 19 mg/dL (ref 8–23)
CO2: 19 mmol/L — ABNORMAL LOW (ref 22–32)
Calcium: 8.8 mg/dL — ABNORMAL LOW (ref 8.9–10.3)
Chloride: 93 mmol/L — ABNORMAL LOW (ref 98–111)
Creatinine, Ser: 1.51 mg/dL — ABNORMAL HIGH (ref 0.44–1.00)
GFR calc Af Amer: 41 mL/min — ABNORMAL LOW (ref >60)
GFR calc non Af Amer: 36 mL/min — ABNORMAL LOW (ref >60)
Glucose, Bld: 102 mg/dL — ABNORMAL HIGH (ref 70–99)
Potassium: 4.1 mmol/L (ref 3.5–5.1)
Sodium: 123 mmol/L — ABNORMAL LOW (ref 135–145)
Total Bilirubin: 0.7 mg/dL (ref 0.3–1.2)
Total Protein: 7.1 g/dL (ref 6.5–8.1)

## 2020-04-13 LAB — CREATININE, SERUM
Creatinine, Ser: 1.5 mg/dL — ABNORMAL HIGH (ref 0.44–1.00)
GFR calc Af Amer: 42 mL/min — ABNORMAL LOW (ref >60)
GFR calc non Af Amer: 36 mL/min — ABNORMAL LOW (ref >60)

## 2020-04-13 LAB — SARS CORONAVIRUS 2 BY RT PCR (HOSPITAL ORDER, PERFORMED IN ~~LOC~~ HOSPITAL LAB): SARS Coronavirus 2: NEGATIVE

## 2020-04-13 LAB — HIV ANTIBODY (ROUTINE TESTING W REFLEX): HIV Screen 4th Generation wRfx: NONREACTIVE

## 2020-04-13 LAB — SODIUM, URINE, RANDOM: Sodium, Ur: 50 mmol/L

## 2020-04-13 LAB — URINALYSIS, ROUTINE W REFLEX MICROSCOPIC
Bilirubin Urine: NEGATIVE
Glucose, UA: NEGATIVE mg/dL
Ketones, ur: 5 mg/dL — AB
Nitrite: NEGATIVE
Protein, ur: 100 mg/dL — AB
RBC / HPF: >50 RBC/hpf — ABNORMAL HIGH (ref 0–5)
Specific Gravity, Urine: 1.019 (ref 1.005–1.030)
WBC, UA: >50 WBC/hpf — ABNORMAL HIGH (ref 0–5)
pH: 5 (ref 5.0–8.0)

## 2020-04-13 LAB — LIPASE, BLOOD: Lipase: 27 U/L (ref 11–51)

## 2020-04-13 LAB — OSMOLALITY: Osmolality: 265 mOsm/kg — ABNORMAL LOW (ref 275–295)

## 2020-04-13 LAB — OSMOLALITY, URINE: Osmolality, Ur: 617 mOsm/kg (ref 300–900)

## 2020-04-13 MED ORDER — SODIUM CHLORIDE 0.9 % IV SOLN
1.0000 g | INTRAVENOUS | Status: AC
  Administered 2020-04-13 – 2020-04-16 (×3): 1 g via INTRAVENOUS
  Filled 2020-04-13: qty 1
  Filled 2020-04-13 (×2): qty 10
  Filled 2020-04-13: qty 1

## 2020-04-13 MED ORDER — ENOXAPARIN SODIUM 40 MG/0.4ML ~~LOC~~ SOLN
40.0000 mg | SUBCUTANEOUS | Status: DC
  Administered 2020-04-13 – 2020-04-14 (×2): 40 mg via SUBCUTANEOUS
  Filled 2020-04-13 (×2): qty 0.4

## 2020-04-13 MED ORDER — SODIUM CHLORIDE 0.9 % IV SOLN: INTRAVENOUS | Status: DC

## 2020-04-13 MED ORDER — HYDRALAZINE HCL 25 MG PO TABS
25.0000 mg | ORAL_TABLET | Freq: Four times a day (QID) | ORAL | Status: DC | PRN
  Administered 2020-04-20: 25 mg via ORAL
  Filled 2020-04-13: qty 1

## 2020-04-13 MED ORDER — ACETAMINOPHEN 500 MG PO TABS: 500.0000 mg | ORAL_TABLET | Freq: Four times a day (QID) | ORAL | Status: DC | PRN

## 2020-04-13 MED ORDER — SODIUM CHLORIDE 0.9 % IV SOLN: INTRAVENOUS | Status: AC

## 2020-04-13 MED ORDER — ACETAMINOPHEN 325 MG PO TABS
650.0000 mg | ORAL_TABLET | Freq: Four times a day (QID) | ORAL | Status: DC | PRN
  Administered 2020-05-05: 650 mg via ORAL
  Filled 2020-04-13: qty 2

## 2020-04-13 MED ORDER — ONDANSETRON HCL 4 MG/2ML IJ SOLN
4.0000 mg | Freq: Four times a day (QID) | INTRAMUSCULAR | Status: DC | PRN
  Administered 2020-04-16 – 2020-05-10 (×24): 4 mg via INTRAVENOUS
  Filled 2020-04-13 (×25): qty 2

## 2020-04-13 MED ORDER — SODIUM CHLORIDE 0.9 % IV BOLUS
1000.0000 mL | Freq: Once | INTRAVENOUS | Status: AC
  Administered 2020-04-13: 1000 mL via INTRAVENOUS

## 2020-04-13 MED ORDER — ONDANSETRON HCL 4 MG PO TABS: 4.0000 mg | ORAL_TABLET | Freq: Four times a day (QID) | ORAL | Status: DC | PRN

## 2020-04-13 MED ORDER — ACETAMINOPHEN 650 MG RE SUPP: 650.0000 mg | Freq: Four times a day (QID) | RECTAL | Status: DC | PRN

## 2020-04-13 MED ORDER — METOPROLOL SUCCINATE ER 25 MG PO TB24
25.0000 mg | ORAL_TABLET | Freq: Every day | ORAL | Status: DC
  Administered 2020-04-13 – 2020-04-26 (×14): 25 mg via ORAL
  Filled 2020-04-13 (×14): qty 1

## 2020-04-13 MED ORDER — NITROGLYCERIN 0.4 MG SL SUBL: 0.4000 mg | SUBLINGUAL_TABLET | SUBLINGUAL | Status: DC | PRN

## 2020-04-13 NOTE — ED Provider Notes (Signed)
  Face-to-face evaluation   History: She presents for evaluation of diarrhea with subsequent weakness.  She has been ill about 3 weeks.  Physical exam: Obese alert elderly female who is calm comfortable.  Abdomen is soft and nontender to palpation.  There is no dysarthria or aphasia.  Medical screening examination/treatment/procedure(s) were conducted as a shared visit with non-physician practitioner(s) and myself.  I personally evaluated the patient during the encounter    Daleen Bo, MD 04/13/20 [phone]

## 2020-04-13 NOTE — H&P (Addendum)
History and Physical  Eileen Burgess:[id number] DOB: December 22, 1955 DOA: 04/13/2020  PCP: Patient, No Pcp Per Patient coming from: Home  I have personally briefly reviewed patient's old medical records in [place]   Chief Complaint: Malaise and fatigue  HPI: Eileen Burgess is a 64 y.o. female past medical history of CAD status post angioplasty to the LAD, essential hypertension history of tobacco abuse she is currently non-smoking, GERD that comes into the hospital for 3 weeks of episodic diarrhea that has progressively gotten worse, she has not received any antibiotics over the last year, she is also been nauseated with loss of appetite and decreased oral intake despite this she has continued to take her antihypertensive medication, she does relate that on the day of admission she started having some dysuria, she denies any  urgency frequency hematuria or hematochezia.  She relates some dizziness upon standing she relates she received a Covid vaccine more than 8 months ago.  In the ED: Vital signs are stable saturations are greater than 97%, she was found to be hyponatremic new acute renal failure with a mildly elevated AST and ALT unremarkable, UA showed positive for bacteria and hemoglobin and more than 50 white blood cell count cardiopulmonary disease.   Review of Systems: All systems reviewed and apart from history of presenting illness, are negative.  Past Medical History:  Diagnosis Date  . CAD S/P percutaneous coronary angioplasty 2005   PCI to pLAD 99%; 3.0 mm x 38 mm Cypher DES; EF ~45% - distal, septal apical hypokinesis    . Dyslipidemia, goal LDL below 70   . Essential hypertension   . GERD (gastroesophageal reflux disease)   . 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 Insomnia    Past Surgical History:  Procedure Laterality Date  . CESAREAN SECTION  05/18/1983  . CORONARY ANGIOPLASTY WITH STENT PLACEMENT  2005   prox LAD 3.0 mm x 28 mm Cypher DES;  circumflex is nondominant with 2 large EOMs. Dominant RCA is a large PDA and posterolateral branch.  . NM MYOVIEW LTD  10/2012   8:50 min; 9 METS.  No ischemia or Infarction; EF ~68%  . OVARIAN CYST REMOVAL  12/1968   Social History:  reports that she quit smoking about 15 years ago. She has never used smokeless tobacco. She reports that she does not drink alcohol and does not use drugs.   Allergies  Allergen Reactions  . Shrimp [Shellfish Allergy] Anaphylaxis  . Crestor [Rosuvastatin] Other (See Comments)    LEGS HURTING  . Pravastatin Other (See Comments)    myalagia  . Vytorin [Ezetimibe-Simvastatin] Other (See Comments)    myalgia  . Wasp Venom Hives    Family History  Problem Relation Age of Onset  . Stroke Mother   . Hypertension Mother   . Hyperlipidemia Father   . Hyperlipidemia Son        not known, pt assumes because of poor diet    Prior to Admission medications   Medication Sig Start Date End Date Taking? Authorizing Provider  acetaminophen (TYLENOL) 500 MG tablet Take 500-1,000 mg by mouth every 6 (six) hours as needed for mild pain or moderate pain.   Yes [provider]  hydrochlorothiazide (MICROZIDE) 12.5 MG capsule TAKE 1 CAPSULE BY MOUTH DAILY Patient taking differently: Take 12.5 mg by mouth daily.  03/07/20  Yes [name], MD  Ibuprofen-Acetaminophen (ADVIL DUAL ACTION) 125-250 MG TABS Take 2 tablets by mouth every  8 (eight) hours as needed (pain).   Yes [provider]  lisinopril (ZESTRIL) 20 MG tablet TAKE 1 TABLET BY MOUTH DAILY Patient taking differently: Take 20 mg by mouth daily.  03/07/20  Yes [name], MD  metoprolol succinate (TOPROL-XL) 25 MG 24 hr tablet Take 1 tablet (25 mg total) by mouth daily. 09/08/19  Yes [name], MD  nitroGLYCERIN (NITROSTAT) 0.4 MG SL tablet TAKE 1 TABLET UNDER THE TONGUE AS NEEDEDFOR CHEST PAIN UP TO 3 TIMES AN EPISODE Patient taking differently: Place 0.4 mg under the tongue every 5  (five) minutes x 3 doses as needed for chest pain.  06/16/18  Yes [name], MD  Alirocumab (PRALUENT) 150 MG/ML SOAJ Inject 150 mg into the skin every 14 (fourteen) days. Patient not taking: Reported on 04/13/2020 06/02/19   [name], MD   Physical Exam: Vitals:   04/13/20 1358 04/13/20 1429 04/13/20 1518 04/13/20 1602  BP: (!) 137/93 140/89 (!) 144/87 135/80  Pulse: 91 94 91 87  Resp: 13 12 16 16   Temp: 97.8 F (36.6 C)  97.8 F (36.6 C)   TempSrc: Oral  Oral   SpO2: 95% 97% 96% 97%  Weight:      Height:         General exam: Moderately built and nourished patient, lying comfortably supine on the gurney in no obvious distress.  Head, eyes and ENT: Nontraumatic and normocephalic. Pupils equally reacting to light and accommodation. Oral mucosa moist.  Neck: Supple. No JVD, carotid bruit or thyromegaly.  Lymphatics: No lymphadenopathy.  Respiratory system: Clear to auscultation. No increased work of breathing.  Cardiovascular system: S1 and S2 heard, RRR. No JVD, murmurs.  Gastrointestinal system: Positive bowel sounds soft mild suprapubic tenderness no rebound or guarding.  Central nervous system: Alert and oriented. No focal neurological deficits.  Extremities: Symmetric 5 x 5 power. Peripheral pulses symmetrically felt.   Skin: No rashes or acute findings.  Musculoskeletal system: Negative exam.  Psychiatry: Pleasant and cooperative.   Labs on Admission:  Basic Metabolic Panel: Recent Labs  Lab 04/13/20 1330  NA 123*  K 4.1  CL 93*  CO2 19*  GLUCOSE 102*  BUN 19  CREATININE 1.51*  CALCIUM 8.8*   Liver Function Tests: Recent Labs  Lab 04/13/20 1330  AST 14*  ALT 12  ALKPHOS 65  BILITOT 0.7  PROT 7.1  ALBUMIN 3.4*   Recent Labs  Lab 04/13/20 1330  LIPASE 27   No results for input(s): AMMONIA in the last 168 hours. CBC: Recent Labs  Lab 04/13/20 1330  WBC 8.8  NEUTROABS 7.0  HGB 13.4  HCT 39.3  MCV 81.0  PLT 347    Cardiac Enzymes: No results for input(s): CKTOTAL, CKMB, CKMBINDEX, TROPONINI in the last 168 hours.  BNP (last 3 results) No results for input(s): PROBNP in the last 8760 hours. CBG: No results for input(s): GLUCAP in the last 168 hours.  Radiological Exams on Admission: DG Chest 2 View  Result Date: 04/13/2020 CLINICAL DATA:  Weakness diarrhea EXAM: CHEST - 2 VIEW COMPARISON:  07/28/2004 FINDINGS: Heart size upper normal.  Vascularity normal.  Coronary stent noted. Small bilateral pleural effusions. Mild bibasilar atelectasis/infiltrate. IMPRESSION: Interval development of mild bibasilar airspace disease and small pleural effusions. Negative for heart failure. Electronically Signed   By: Franchot Gallo M.D.   On: 04/13/2020 13:57    EKG: Independently reviewed. NONE  Assessment/Plan UTI (urinary tract infection): With mild suprapubic tenderness and  dysuria UA compatible with an infection. We will start her on IV Rocephin urine cultures and blood cultures have been sent, also started on IV fluids. She relates she at this point in time is having trouble with her insurance and she would like to spend in the hospital the least time possible. Sepsis has been ruled out.  Hypovolemic hyponatremia/orthostatic hypotension: She clearly relates some dizziness upon standing in the setting of ongoing antihypertensive medication use. We will stop antihypertensive medication we will hydrate her aggressively, the ED has already sent urine osmolarity and urine sodium. We will check a basic metabolic panel in the morning.  Acute kidney injury: Likely prerenal azotemia we will start her on IV fluids and recheck a basic metabolic panel in the morning, in the setting of hydrochlorothiazide and lisinopril use.  Essential hypertension Pressure seems to be fairly controlled, will hold lisinopril and hydrochlorothiazide will use hydralazine IV as needed.  Mildly elevated LFT's: AST isolated ALT  unremarkable, likely due to UTI.    DVT Prophylaxis: LOVENOX Code Status: NONe  Family Communication: Son  Disposition Plan: Observation   It is my clinical opinion that admission to observation is reasonable and necessary in this 64 y.o. female clinical picture of UTI the supra suprapubic tenderness and acute kidney injury, started on IV fluids and IV Rocephin.  Given the aforementioned, the predictability of an adverse outcome is felt to be significant. I expect that the patient will require at less than 2 midnights .  Charlynne Cousins MD [facility]   04/13/2020, 4:42 PM

## 2020-04-13 NOTE — ED Triage Notes (Signed)
Pt BIBA from home, c/o diarrhea x3 weeks along with increasing weakness. Pt states dizziness when standing up so decided to call 911. States she has not taken BP medications x3 days d/t not feeling well. Also reports not drinking and eating as well as normal. Negative orthostatic VS.  BP 170/82 97% RA CBG 109 T 97.1 P 97

## 2020-04-13 NOTE — ED Provider Notes (Signed)
[facility] DEPT Provider Note   CSN: [id number] Arrival date & time: 04/13/20  1157     History No chief complaint on file.   Eileen Burgess is a 64 y.o. female.  HPI   Patient presents to the emergency department with chief complaint of increasing weakness over the last 2 days.  She explains the last 3 weeks she has had episodic diarrhea as well as weakness.  She states when she has diarrhea she also becomes nauseous and loses her appetite.  She states she has not been eating a lot over the last few days.  She admits to occasional lower abdominal pain that seems to come and go, she denies dysuria, urgency, frequency, hematuria or hematochezia.  She denies fever chills, headache, change in vision, dizziness, loss of balance, recent falls, denies paresthesias in extremities, but states she feels very dry and when she stands for long periods of time her legs get weak and she feels like they will buckle out.  She has had no fever or chills, no known sick contacts, is currently vaccinated for COVID-19.  She has significant medical history of CAD with cutaneous coronary artery angioplasty done and 2005.  Hypertension, acid reflux, insomnia.  She denies headache, fever, chills, shortness of breath, chest pain, vomiting, dysuria, pedal edema.  Past Medical History:  Diagnosis Date  . CAD S/P percutaneous coronary angioplasty 2005   PCI to pLAD 99%; 3.0 mm x 38 mm Cypher DES; EF ~45% - distal, septal apical hypokinesis    . Dyslipidemia, goal LDL below 70   . Essential hypertension   . GERD (gastroesophageal reflux disease)   . 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 Insomnia     Patient Active Problem List   Diagnosis Date Noted  . UTI (urinary tract infection) 04/13/2020  . Acute kidney injury ([place]) 04/13/2020  . Statin intolerance 02/19/2014  . Anxiety - very rare "panick attacks" 02/19/2014  . Essential hypertension 02/19/2014  . Obesity (BMI  30-39.9) 04/03/2013  . CAD S/P percutaneous coronary angioplasty   . Dyslipidemia, goal LDL below 70   . 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 CESAREAN SECTION  05/18/1983  . CORONARY ANGIOPLASTY WITH STENT PLACEMENT  2005   prox LAD 3.0 mm x 28 mm Cypher DES; circumflex is nondominant with 2 large EOMs. Dominant RCA is a large PDA and posterolateral branch.  . NM MYOVIEW LTD  10/2012   8:50 min; 9 METS.  No ischemia or Infarction; EF ~68%  . OVARIAN CYST REMOVAL  12/1968     OB History   No obstetric history on file.     Family History  Problem Relation Age of Onset  . Stroke Mother   . Hypertension Mother   . Hyperlipidemia Father   . Hyperlipidemia Son        not known, pt assumes because of poor diet    Social History   Tobacco Use  . Smoking status: Former Smoker    Quit date: 07/03/2004    Years since quitting: 15.7  . Smokeless tobacco: Never Used  Substance Use Topics  . Alcohol use: No  . Drug use: No    Home Medications Prior to Admission medications   Medication Sig Start Date End Date Taking? Authorizing Provider  acetaminophen (TYLENOL) 500 MG tablet Take 500-1,000 mg by mouth every 6 (six) hours as needed for mild pain or  moderate pain.   Yes [provider]  hydrochlorothiazide (MICROZIDE) 12.5 MG capsule TAKE 1 CAPSULE BY MOUTH DAILY Patient taking differently: Take 12.5 mg by mouth daily.  03/07/20  Yes [name], MD  Ibuprofen-Acetaminophen (ADVIL DUAL ACTION) 125-250 MG TABS Take 2 tablets by mouth every 8 (eight) hours as needed (pain).   Yes [provider]  lisinopril (ZESTRIL) 20 MG tablet TAKE 1 TABLET BY MOUTH DAILY Patient taking differently: Take 20 mg by mouth daily.  03/07/20  Yes [name], MD  metoprolol succinate (TOPROL-XL) 25 MG 24 hr tablet Take 1 tablet (25 mg total) by mouth daily. 09/08/19  Yes [name], MD  nitroGLYCERIN (NITROSTAT)  0.4 MG SL tablet TAKE 1 TABLET UNDER THE TONGUE AS NEEDEDFOR CHEST PAIN UP TO 3 TIMES AN EPISODE Patient taking differently: Place 0.4 mg under the tongue every 5 (five) minutes x 3 doses as needed for chest pain.  06/16/18  Yes [name], MD  Alirocumab (PRALUENT) 150 MG/ML SOAJ Inject 150 mg into the skin every 14 (fourteen) days. Patient not taking: Reported on 04/13/2020 06/02/19   [name], MD    Allergies    Shrimp [shellfish allergy], Crestor [rosuvastatin], Pravastatin, Vytorin [ezetimibe-simvastatin], and Wasp venom  Review of Systems   Review of Systems  Constitutional: Positive for activity change and chills. Negative for fatigue and fever.  HENT: Negative for congestion, tinnitus, trouble swallowing and voice change.   Eyes: Negative for visual disturbance.  Respiratory: Negative for cough and shortness of breath.   Cardiovascular: Negative for chest pain, palpitations and leg swelling.  Gastrointestinal: Positive for abdominal pain, diarrhea and nausea. Negative for vomiting.  Genitourinary: Negative for dysuria, enuresis, flank pain, frequency and vaginal bleeding.  Musculoskeletal: Negative for back pain, myalgias and neck pain.  Skin: Negative for rash.  Neurological: Positive for weakness. Negative for dizziness and headaches.  Hematological: Does not bruise/bleed easily.    Physical Exam Updated Vital Signs BP 135/80   Pulse 87   Temp 97.8 F (36.6 C) (Oral)   Resp 16   Ht 5\' 4"  (1.626 m)   Wt 98 kg   LMP  (LMP Unknown)   SpO2 97%   BMI 37.09 kg/m   Physical Exam Vitals and nursing note reviewed.  Constitutional:      General: She is not in acute distress.    Appearance: Normal appearance. She is not ill-appearing or diaphoretic.  HENT:     Head: Normocephalic and atraumatic.     Nose: No congestion or rhinorrhea.     Mouth/Throat:     Mouth: Mucous membranes are moist.     Pharynx: Oropharynx is clear. No oropharyngeal exudate or  posterior oropharyngeal erythema.  Eyes:     General: No visual field deficit or scleral icterus.    Conjunctiva/sclera: Conjunctivae normal.     Pupils: Pupils are equal, round, and reactive to light.  Cardiovascular:     Rate and Rhythm: Normal rate and regular rhythm.     Pulses: Normal pulses.     Heart sounds: No murmur heard.  No friction rub. No gallop.   Pulmonary:     Effort: Pulmonary effort is normal. No respiratory distress.     Breath sounds: No stridor. No wheezing, rhonchi or rales.  Chest:     Chest wall: No tenderness.  Abdominal:     General: There is no distension.     Palpations: Abdomen is soft.     Tenderness:  There is no abdominal tenderness. There is no right CVA tenderness, left CVA tenderness or guarding.     Comments: Abdomen was evaluated, nondistended, normoactive bowel sounds, dull to percussion, no tenderness to palpation no signs of acute abdomen noted.  Musculoskeletal:        General: No swelling or tenderness.     Cervical back: No rigidity or tenderness.     Right lower leg: No edema.     Left lower leg: No edema.  Skin:    General: Skin is warm and dry.     Capillary Refill: Capillary refill takes less than 2 seconds.     Findings: No rash.  Neurological:     General: No focal deficit present.     Mental Status: She is alert and oriented to person, place, and time.     GCS: GCS eye subscore is 4. GCS verbal subscore is 5. GCS motor subscore is 6.     Cranial Nerves: Cranial nerves are intact. No cranial nerve deficit or facial asymmetry.     Sensory: Sensation is intact. No sensory deficit.     Motor: Motor function is intact. No weakness or pronator drift.     Coordination: Coordination is intact. Romberg sign negative. Finger-Nose-Finger Test and Heel to [facility] Test normal.  Psychiatric:        Mood and Affect: Mood normal.     ED Results / Procedures / Treatments   Labs (all labs ordered are listed, but only abnormal results are  displayed) Labs Reviewed  COMPREHENSIVE METABOLIC PANEL - Abnormal; Notable for the following components:      Result Value   Sodium 123 (*)    Chloride 93 (*)    CO2 19 (*)    Glucose, Bld 102 (*)    Creatinine, Ser 1.51 (*)    Calcium 8.8 (*)    Albumin 3.4 (*)    AST 14 (*)    GFR calc non Af Amer 36 (*)    GFR calc Af Amer 41 (*)    All other components within normal limits  URINALYSIS, ROUTINE W REFLEX MICROSCOPIC - Abnormal; Notable for the following components:   APPearance HAZY (*)    Hgb urine dipstick LARGE (*)    Ketones, ur 5 (*)    Protein, ur 100 (*)    Leukocytes,Ua LARGE (*)    RBC / HPF >50 (*)    WBC, UA >50 (*)    Bacteria, UA RARE (*)    All other components within normal limits  URINE CULTURE  SARS CORONAVIRUS 2 BY RT PCR (HOSPITAL ORDER, [facility])  URINE CULTURE  CBC WITH DIFFERENTIAL/PLATELET  LIPASE, BLOOD  SODIUM, URINE, RANDOM  OSMOLALITY, URINE  OSMOLALITY, URINE  SODIUM, URINE, RANDOM  OSMOLALITY    EKG None  Radiology DG Chest 2 View  Result Date: 04/13/2020 CLINICAL DATA:  Weakness diarrhea EXAM: CHEST - 2 VIEW COMPARISON:  07/28/2004 FINDINGS: Heart size upper normal.  Vascularity normal.  Coronary stent noted. Small bilateral pleural effusions. Mild bibasilar atelectasis/infiltrate. IMPRESSION: Interval development of mild bibasilar airspace disease and small pleural effusions. Negative for heart failure. Electronically Signed   By: Franchot Gallo M.D.   On: 04/13/2020 13:57    Procedures Procedures (including critical care time)  Medications Ordered in ED Medications  0.9 %  sodium chloride infusion ( Intravenous New Bag/Given 04/13/20 1630)  cefTRIAXone (ROCEPHIN) 1 g in sodium chloride 0.9 % 100 mL IVPB (has no administration  in time range)  sodium chloride 0.9 % bolus 1,000 mL (1,000 mLs Intravenous New Bag/Given 04/13/20 1630)    ED Course  I have reviewed the triage vital signs and the nursing  notes.  Pertinent labs & imaging results that were available during my care of the patient were reviewed by me and considered in my medical decision making (see chart for details).    MDM Rules/Calculators/A&P                          I have personally reviewed all imaging, labs and have interpreted them.  Patient is alert and oriented did not appear to be in any acute distress.  Physical exam did not reveal any acute abnormalities, neuro exam did not reveal focal deficits, no weakness noted on exam.  Abdomen was nontender to palpation no signs of acute abdomen.  Will order screening labs and reassess patient.  Initial labs reveal UA showing leukocytes, red blood cells, red blood cells, and bacteria consistent with UTI possible cause for weakness will provide antibiotics and send urine for culture.  CMP showing hyponatremia 123, worsening AKI 1.51, estimated GFR of 36 this is most likely secondary to hypovolemia due to diarrhea.  CBC does not show leukocytosis or anemia.  Lipase 27.  Chest x-ray shows interval development of mild airspace disease and small pleural effusion negative for heart failure.  Will order osmolality, urine osmolality for further evaluation.  We will hold off on fluids due to hyponatremia.  I have low suspicion for systemic infection as patient is nontoxic-appearing, vital signs reassuring, no obvious source of infection seen on exam, no leukocytosis seen on CBC.  Low suspicion for acute abdomen requiring surgical intervention as abdominal exam did not show acute abdomen, patient is denying nausea or vomiting, no elevated white count noted.  Low suspicion for cardiac abnormality as patient is denying chest pain or shortness of breath, EKG showed sinus rhythm at times of ischemia.  Low suspicion for CVA or intracranial head bleed as neuro exam was benign no focal deficits noted.  Consulted hospitalist for admission due to acute AKI, UTI and hyponatremia.  Spoke with Dr. Aileen Fass abraham who will evaluate the patient and admit them for further evaluation.  Patient care will be transferred over to hospitalist team for further management. Final Clinical Impression(s) / ED Diagnoses Final diagnoses:  Hyponatremia  Acute cystitis with hematuria  AKI (acute kidney injury) [facility])    Rx / DC Orders ED Discharge Orders    None       Marcello Fennel, PA-C 04/13/20 1640    Daleen Bo, MD 04/13/20 [phone]

## 2020-04-14 ENCOUNTER — Inpatient Hospital Stay (HOSPITAL_COMMUNITY): Payer: Medicare Other

## 2020-04-14 DIAGNOSIS — K5669 Other partial intestinal obstruction: Secondary | ICD-10-CM | POA: Diagnosis not present

## 2020-04-14 DIAGNOSIS — K56691 Other complete intestinal obstruction: Secondary | ICD-10-CM | POA: Diagnosis not present

## 2020-04-14 DIAGNOSIS — C762 Malignant neoplasm of abdomen: Secondary | ICD-10-CM | POA: Diagnosis not present

## 2020-04-14 DIAGNOSIS — N131 Hydronephrosis with ureteral stricture, not elsewhere classified: Secondary | ICD-10-CM | POA: Diagnosis present

## 2020-04-14 DIAGNOSIS — N170 Acute kidney failure with tubular necrosis: Secondary | ICD-10-CM | POA: Diagnosis present

## 2020-04-14 DIAGNOSIS — Z66 Do not resuscitate: Secondary | ICD-10-CM | POA: Diagnosis not present

## 2020-04-14 DIAGNOSIS — I25118 Atherosclerotic heart disease of native coronary artery with other forms of angina pectoris: Secondary | ICD-10-CM

## 2020-04-14 DIAGNOSIS — R1084 Generalized abdominal pain: Secondary | ICD-10-CM

## 2020-04-14 DIAGNOSIS — K219 Gastro-esophageal reflux disease without esophagitis: Secondary | ICD-10-CM | POA: Diagnosis present

## 2020-04-14 DIAGNOSIS — R180 Malignant ascites: Secondary | ICD-10-CM | POA: Diagnosis present

## 2020-04-14 DIAGNOSIS — Z7189 Other specified counseling: Secondary | ICD-10-CM | POA: Diagnosis not present

## 2020-04-14 DIAGNOSIS — D61810 Antineoplastic chemotherapy induced pancytopenia: Secondary | ICD-10-CM | POA: Diagnosis not present

## 2020-04-14 DIAGNOSIS — E871 Hypo-osmolality and hyponatremia: Secondary | ICD-10-CM | POA: Diagnosis present

## 2020-04-14 DIAGNOSIS — J918 Pleural effusion in other conditions classified elsewhere: Secondary | ICD-10-CM | POA: Diagnosis not present

## 2020-04-14 DIAGNOSIS — E43 Unspecified severe protein-calorie malnutrition: Secondary | ICD-10-CM | POA: Diagnosis present

## 2020-04-14 DIAGNOSIS — N1831 Chronic kidney disease, stage 3a: Secondary | ICD-10-CM | POA: Diagnosis present

## 2020-04-14 DIAGNOSIS — J810 Acute pulmonary edema: Secondary | ICD-10-CM | POA: Diagnosis not present

## 2020-04-14 DIAGNOSIS — K56609 Unspecified intestinal obstruction, unspecified as to partial versus complete obstruction: Secondary | ICD-10-CM | POA: Diagnosis not present

## 2020-04-14 DIAGNOSIS — I2510 Atherosclerotic heart disease of native coronary artery without angina pectoris: Secondary | ICD-10-CM | POA: Diagnosis present

## 2020-04-14 DIAGNOSIS — C786 Secondary malignant neoplasm of retroperitoneum and peritoneum: Secondary | ICD-10-CM | POA: Diagnosis present

## 2020-04-14 DIAGNOSIS — Z515 Encounter for palliative care: Secondary | ICD-10-CM | POA: Diagnosis not present

## 2020-04-14 DIAGNOSIS — I1310 Hypertensive heart and chronic kidney disease without heart failure, with stage 1 through stage 4 chronic kidney disease, or unspecified chronic kidney disease: Secondary | ICD-10-CM | POA: Diagnosis present

## 2020-04-14 DIAGNOSIS — R3129 Other microscopic hematuria: Secondary | ICD-10-CM

## 2020-04-14 DIAGNOSIS — Z6839 Body mass index (BMI) 39.0-39.9, adult: Secondary | ICD-10-CM | POA: Diagnosis not present

## 2020-04-14 DIAGNOSIS — J9601 Acute respiratory failure with hypoxia: Secondary | ICD-10-CM | POA: Diagnosis not present

## 2020-04-14 DIAGNOSIS — N179 Acute kidney failure, unspecified: Secondary | ICD-10-CM | POA: Diagnosis present

## 2020-04-14 DIAGNOSIS — C539 Malignant neoplasm of cervix uteri, unspecified: Secondary | ICD-10-CM | POA: Diagnosis present

## 2020-04-14 DIAGNOSIS — K920 Hematemesis: Secondary | ICD-10-CM | POA: Diagnosis not present

## 2020-04-14 DIAGNOSIS — I951 Orthostatic hypotension: Secondary | ICD-10-CM | POA: Diagnosis present

## 2020-04-14 DIAGNOSIS — C801 Malignant (primary) neoplasm, unspecified: Secondary | ICD-10-CM | POA: Diagnosis not present

## 2020-04-14 DIAGNOSIS — N3001 Acute cystitis with hematuria: Secondary | ICD-10-CM | POA: Diagnosis not present

## 2020-04-14 DIAGNOSIS — R531 Weakness: Secondary | ICD-10-CM | POA: Diagnosis not present

## 2020-04-14 DIAGNOSIS — E875 Hyperkalemia: Secondary | ICD-10-CM | POA: Diagnosis not present

## 2020-04-14 DIAGNOSIS — Z20822 Contact with and (suspected) exposure to covid-19: Secondary | ICD-10-CM | POA: Diagnosis present

## 2020-04-14 DIAGNOSIS — E872 Acidosis: Secondary | ICD-10-CM | POA: Diagnosis not present

## 2020-04-14 DIAGNOSIS — C800 Disseminated malignant neoplasm, unspecified: Secondary | ICD-10-CM | POA: Diagnosis not present

## 2020-04-14 DIAGNOSIS — I10 Essential (primary) hypertension: Secondary | ICD-10-CM | POA: Diagnosis not present

## 2020-04-14 DIAGNOSIS — E785 Hyperlipidemia, unspecified: Secondary | ICD-10-CM | POA: Diagnosis present

## 2020-04-14 DIAGNOSIS — K567 Ileus, unspecified: Secondary | ICD-10-CM | POA: Diagnosis not present

## 2020-04-14 LAB — BASIC METABOLIC PANEL
Anion gap: 10 (ref 5–15)
Anion gap: 10 (ref 5–15)
Anion gap: 9 (ref 5–15)
BUN: 16 mg/dL (ref 8–23)
BUN: 15 mg/dL (ref 8–23)
BUN: 14 mg/dL (ref 8–23)
CO2: 21 mmol/L — ABNORMAL LOW (ref 22–32)
CO2: 20 mmol/L — ABNORMAL LOW (ref 22–32)
CO2: 21 mmol/L — ABNORMAL LOW (ref 22–32)
Calcium: 8.4 mg/dL — ABNORMAL LOW (ref 8.9–10.3)
Calcium: 8.3 mg/dL — ABNORMAL LOW (ref 8.9–10.3)
Calcium: 8.3 mg/dL — ABNORMAL LOW (ref 8.9–10.3)
Chloride: 96 mmol/L — ABNORMAL LOW (ref 98–111)
Chloride: 98 mmol/L (ref 98–111)
Chloride: 97 mmol/L — ABNORMAL LOW (ref 98–111)
Creatinine, Ser: 1.45 mg/dL — ABNORMAL HIGH (ref 0.44–1.00)
Creatinine, Ser: 1.46 mg/dL — ABNORMAL HIGH (ref 0.44–1.00)
Creatinine, Ser: 1.42 mg/dL — ABNORMAL HIGH (ref 0.44–1.00)
GFR calc Af Amer: 43 mL/min — ABNORMAL LOW (ref >60)
GFR calc Af Amer: 43 mL/min — ABNORMAL LOW (ref >60)
GFR calc Af Amer: 44 mL/min — ABNORMAL LOW (ref >60)
GFR calc non Af Amer: 37 mL/min — ABNORMAL LOW (ref >60)
GFR calc non Af Amer: 37 mL/min — ABNORMAL LOW (ref >60)
GFR calc non Af Amer: 38 mL/min — ABNORMAL LOW (ref >60)
Glucose, Bld: 87 mg/dL (ref 70–99)
Glucose, Bld: 96 mg/dL (ref 70–99)
Glucose, Bld: 89 mg/dL (ref 70–99)
Potassium: 4.3 mmol/L (ref 3.5–5.1)
Potassium: 4.3 mmol/L (ref 3.5–5.1)
Potassium: 4.2 mmol/L (ref 3.5–5.1)
Sodium: 127 mmol/L — ABNORMAL LOW (ref 135–145)
Sodium: 128 mmol/L — ABNORMAL LOW (ref 135–145)
Sodium: 127 mmol/L — ABNORMAL LOW (ref 135–145)

## 2020-04-14 LAB — CBC
HCT: 35.6 % — ABNORMAL LOW (ref 36.0–46.0)
Hemoglobin: 11.9 g/dL — ABNORMAL LOW (ref 12.0–15.0)
MCH: 27.8 pg (ref 26.0–34.0)
MCHC: 33.4 g/dL (ref 30.0–36.0)
MCV: 83.2 fL (ref 80.0–100.0)
Platelets: 302 10*3/uL (ref 150–400)
RBC: 4.28 MIL/uL (ref 3.87–5.11)
RDW: 13.2 % (ref 11.5–15.5)
WBC: 5.6 10*3/uL (ref 4.0–10.5)
nRBC: 0 % (ref 0.0–0.2)

## 2020-04-14 LAB — URINE CULTURE: Culture: 20000 — AB

## 2020-04-14 MED ORDER — SODIUM CHLORIDE 0.9 % IV SOLN: INTRAVENOUS | Status: DC

## 2020-04-14 MED ORDER — PANTOPRAZOLE SODIUM 40 MG PO TBEC
40.0000 mg | DELAYED_RELEASE_TABLET | Freq: Every day | ORAL | Status: DC
  Administered 2020-04-14 – 2020-04-26 (×13): 40 mg via ORAL
  Filled 2020-04-14 (×12): qty 1

## 2020-04-14 NOTE — Progress Notes (Signed)
PROGRESS NOTE    Eileen Burgess  VEL:[id number]  DOB: 1956/03/08  PCP: Patient, No Pcp Per Admit date:04/13/2020 Chief compliant:  64 y.o. female past medical history of CAD status post angioplasty to the LAD, essential hypertension history of tobacco abuse she is currently non-smoking, GERD that comes into the hospital for 3 weeks of episodic diarrhea that has progressively gotten worse, she has not received any antibiotics over the last year, she is also been nauseated with loss of appetite and decreased oral intake despite this she has continued to take her antihypertensive medication, she does relate that on the day of admission she started having some dysuria, she denies any  urgency frequency hematuria or hematochezia.  She relates some dizziness upon standing she relates she received a Covid vaccine more than 8 months ago. ED Course: Afebrile,Vital signs are stable- saturations are greater than 97%, she was found to be hyponatremic, new acute renal failure with elevated AST and ALT unremarkable, UA  positive for bacteria and hemoglobin large, WBC/RBC>  50  Hospital course: Patient admitted to [facility] for further evaluation and management.  Subjective:  Patient resting comfortably.  Reports feeling somewhat lightheaded at times and reports vague diffuse abdominal pain with no appetite.  Patient states she took laxative recently for constipation and after that had several days of loose BM.  Now improved.  Objective: Vitals:   04/14/20 0421 04/14/20 1338 04/14/20 1340 04/14/20 1342  BP: (!) 134/51 125/69 122/81 110/66  Pulse: 75 76 80 98  Resp: 15 18    Temp: 98.3 F (36.8 C) 98.4 F (36.9 C)    TempSrc: Oral Oral    SpO2: 95% 95% 96% 98%  Weight:      Height:       No intake or output data in the 24 hours ending 04/14/20 1604 Filed Weights   04/13/20 1234  Weight: 98 kg    Physical Examination:  General: Moderately built, no acute distress noted Head ENT: Atraumatic  normocephalic, PERRLA, neck supple Heart: S1-S2 heard, regular rate and rhythm, no murmurs.  No leg edema noted Lungs: Equal air entry bilaterally, no rhonchi or rales on exam, no accessory muscle use Abdomen: Bowel sounds heard, soft, mild diffuse tenderness, no guarding or rebound, nondistended.  Extremities: No pedal edema.  No cyanosis or clubbing. Neurological: Awake alert oriented x3, no focal weakness or numbness, strength and sensations to crude touch intact Skin: No wounds or rashes.  Data Reviewed: I have personally reviewed following labs and imaging studies  CBC: Recent Labs  Lab 04/13/20 1330 04/13/20 1829 04/14/20 0535  WBC 8.8 7.7 5.6  NEUTROABS 7.0  --   --   HGB 13.4 12.7 11.9*  HCT 39.3 38.3 35.6*  MCV 81.0 83.1 83.2  PLT 347 325 258   Basic Metabolic Panel: Recent Labs  Lab 04/13/20 1330 04/13/20 1829 04/14/20 0535 04/14/20 1215  NA 123*  --  127* 128*  K 4.1  --  4.3 4.3  CL 93*  --  96* 98  CO2 19*  --  21* 20*  GLUCOSE 102*  --  87 96  BUN 19  --  16 15  CREATININE 1.51* 1.50* 1.45* 1.46*  CALCIUM 8.8*  --  8.4* 8.3*   GFR: Estimated Creatinine Clearance: 43.1 mL/min (A) (by C-G formula based on SCr of 1.46 mg/dL (H)). Liver Function Tests: Recent Labs  Lab 04/13/20 1330  AST 14*  ALT 12  ALKPHOS 65  BILITOT 0.7  PROT 7.1  ALBUMIN 3.4*   Recent Labs  Lab 04/13/20 1330  LIPASE 27   No results for input(s): AMMONIA in the last 168 hours. Coagulation Profile: No results for input(s): INR, PROTIME in the last 168 hours. Cardiac Enzymes: No results for input(s): CKTOTAL, CKMB, CKMBINDEX, TROPONINI in the last 168 hours. BNP (last 3 results) No results for input(s): PROBNP in the last 8760 hours. HbA1C: No results for input(s): HGBA1C in the last 72 hours. CBG: No results for input(s): GLUCAP in the last 168 hours. Lipid Profile: No results for input(s): CHOL, HDL, LDLCALC, TRIG, CHOLHDL, LDLDIRECT in the last 72 hours. Thyroid  Function Tests: No results for input(s): TSH, T4TOTAL, FREET4, T3FREE, THYROIDAB in the last 72 hours. Anemia Panel: No results for input(s): VITAMINB12, FOLATE, FERRITIN, TIBC, IRON, RETICCTPCT in the last 72 hours. Sepsis Labs: No results for input(s): PROCALCITON, LATICACIDVEN in the last 168 hours.  Recent Results (from the past 240 hour(s))  Urine culture     Status: Abnormal   Collection Time: 04/13/20  1:20 PM   Specimen: Urine, Random  Result Value Ref Range Status   Specimen Description   Final    URINE, RANDOM Performed at [facility] [address], [place], [place] [address]    Special Requests   Final    NONE Performed at [facility], [place] [address], [place], [place] [address]    Culture (A)  Final    20,000 COLONIES/mL MULTIPLE SPECIES PRESENT, SUGGEST RECOLLECTION   Report Status 04/14/2020 FINAL  Final  SARS Coronavirus 2 by RT PCR (hospital order, performed in [facility] hospital lab) Nasopharyngeal Nasopharyngeal Swab     Status: None   Collection Time: 04/13/20  2:13 PM   Specimen: Nasopharyngeal Swab  Result Value Ref Range Status   SARS Coronavirus 2 NEGATIVE NEGATIVE Final    Comment: (NOTE) SARS-CoV-2 target nucleic acids are NOT DETECTED.  The SARS-CoV-2 RNA is generally detectable in upper and lower respiratory specimens during the acute phase of infection. The lowest concentration of SARS-CoV-2 viral copies this assay can detect is 250 copies / mL. A negative result does not preclude SARS-CoV-2 infection and should not be used as the sole basis for treatment or other patient management decisions.  A negative result may occur with improper specimen collection / handling, submission of specimen other than nasopharyngeal swab, presence of viral mutation(s) within the areas targeted by this assay, and inadequate number of viral copies (<250 copies / mL). A negative result must be combined with  clinical observations, patient history, and epidemiological information.  Fact Sheet for Patients:   StrictlyIdeas.no  Fact Sheet for Healthcare Providers: BankingDealers.co.za  This test is not yet approved or  cleared by the [place] FDA and has been authorized for detection and/or diagnosis of SARS-CoV-2 by FDA under an Emergency Use Authorization (EUA).  This EUA will remain in effect (meaning this test can be used) for the duration of the COVID-19 declaration under Section 564(b)(1) of the Act, 21 U.S.C. section 360bbb-3(b)(1), unless the authorization is terminated or revoked sooner.  Performed at [facility], [place] [address], [place], [place] [address]   Culture, blood (Routine X 2) w Reflex to ID Panel     Status: None (Preliminary result)   Collection Time: 04/13/20  6:28 PM   Specimen: BLOOD  Result Value Ref Range Status   Specimen Description   Final    BLOOD RIGHT ANTECUBITAL Performed at [facility], [address] [place], [place] [address]    Special Requests   Final    BOTTLES DRAWN AEROBIC AND ANAEROBIC Blood Culture adequate volume Performed at [facility] [address] [place], [place] [address]    Culture   Final    NO GROWTH < 12 HOURS Performed at [facility] [address] [place], [place] [address]    Report Status PENDING  Incomplete  Culture, blood (Routine X 2) w Reflex to ID Panel     Status: None (Preliminary result)   Collection Time: 04/13/20  6:40 PM   Specimen: BLOOD RIGHT HAND  Result Value Ref Range Status   Specimen Description   Final    BLOOD RIGHT HAND Performed at [facility] [address] [place], [place] [address]    Special Requests   Final    BOTTLES DRAWN AEROBIC AND ANAEROBIC Blood Culture adequate volume Performed at [facility] [address] [place], [place]  [address]    Culture   Final    NO GROWTH < 12 HOURS Performed at [facility] [address], [place],  [address]    Report Status PENDING  Incomplete      Radiology Studies: DG Chest 2 View  Result Date: 04/13/2020 CLINICAL DATA:  Weakness diarrhea EXAM: CHEST - 2 VIEW COMPARISON:  07/28/2004 FINDINGS: Heart size upper normal.  Vascularity normal.  Coronary stent noted. Small bilateral pleural effusions. Mild bibasilar atelectasis/infiltrate. IMPRESSION: Interval development of mild bibasilar airspace disease and small pleural effusions. Negative for heart failure. Electronically Signed   By: Franchot Gallo M.D.   On: 04/13/2020 13:57      Scheduled Meds: . enoxaparin (LOVENOX) injection  40 mg Subcutaneous Q24H  . metoprolol succinate  25 mg Oral Daily  . pantoprazole  40 mg Oral Daily   Continuous Infusions: . sodium chloride 75 mL/hr at 04/14/20 1358  . cefTRIAXone (ROCEPHIN)  IV 1 g (04/13/20 1706)      Assessment/Plan:  1.  Hyponatremia: Appears to be dehydration related in the setting of recent diarrhea, HCTZ use.  Improving slowly with IV hydration.  Continue to hold diuretics.  2.  AKI: Also in the setting of dehydration and HCTZ use.  Improving slowly with IV fluids.  Hold ACE inhibitors as well as HCTZ.  CT renal given problem #4  3. Abdominal pain: Being treated empirically for cystitis given abnormal UA: With pyuria/microscopic hematuria.  Urine culture nonspecific and repeat specimen recommended.  Afebrile, denies dysuria.  Can likely DC antibiotics after a.m. dose. Will obtain CT renal to rule out hydronephrosis/nephrolithiasis.  May also help to assess stool burden possibly contributing in the setting of dehydration.  Patient states she had loose stools after taking laxative as outpatient.  Abdominal pain could also be related to hyponatremia.    4.  Hypertension: Continue metoprolol and watch blood pressure.  Avoid diuretics and ACE inhibitors.   5.   CAD s/p PTCA:?  Not on aspirin.  Will add 81 mg.  Continue metoprolol.  6.  GERD: Resume PPI.   DVT prophylaxis: Lovenox Code Status: Full code Family / Patient Communication: Discussed with patient in detail at bedside. Disposition Plan:   Status is: Inpatient  Remains inpatient appropriate because:Ongoing diagnostic testing needed not appropriate for outpatient work up and IV treatments appropriate due to intensity of illness or inability to take PO   Dispo: The patient is from: Home  Anticipated d/c is to: Home              Anticipated d/c date is: 1-2 days              Patient currently is not medically stable to d/c.           Time spent: 35 minutes   >50% time spent in discussions with care team and coordination of care.    Guilford Shi, MD [facility] Pager in [place]  If 7PM-7AM, please contact night-coverage [email or web address] 04/14/2020, 4:04 PM

## 2020-04-15 ENCOUNTER — Other Ambulatory Visit: Payer: Self-pay

## 2020-04-15 ENCOUNTER — Inpatient Hospital Stay (HOSPITAL_COMMUNITY): Payer: Medicare Other

## 2020-04-15 ENCOUNTER — Encounter (HOSPITAL_COMMUNITY)
Admission: EM | Disposition: A | Discharge status: Hospice | Payer: Self-pay | Source: Home / Self Care | Attending: Internal Medicine

## 2020-04-15 ENCOUNTER — Inpatient Hospital Stay (HOSPITAL_COMMUNITY): Payer: Medicare Other | Admitting: Anesthesiology

## 2020-04-15 DIAGNOSIS — N3001 Acute cystitis with hematuria: Secondary | ICD-10-CM

## 2020-04-15 HISTORY — PX: TRANSURETHRAL RESECTION OF BLADDER TUMOR: SHX2575

## 2020-04-15 HISTORY — PX: CYSTOSCOPY W/ URETERAL STENT PLACEMENT: SHX1429

## 2020-04-15 LAB — BASIC METABOLIC PANEL
Anion gap: 8 (ref 5–15)
Anion gap: 10 (ref 5–15)
BUN: 12 mg/dL (ref 8–23)
BUN: 12 mg/dL (ref 8–23)
CO2: 18 mmol/L — ABNORMAL LOW (ref 22–32)
CO2: 19 mmol/L — ABNORMAL LOW (ref 22–32)
Calcium: 8 mg/dL — ABNORMAL LOW (ref 8.9–10.3)
Calcium: 8 mg/dL — ABNORMAL LOW (ref 8.9–10.3)
Chloride: 97 mmol/L — ABNORMAL LOW (ref 98–111)
Chloride: 101 mmol/L (ref 98–111)
Creatinine, Ser: 1.36 mg/dL — ABNORMAL HIGH (ref 0.44–1.00)
Creatinine, Ser: 1.25 mg/dL — ABNORMAL HIGH (ref 0.44–1.00)
GFR calc Af Amer: 47 mL/min — ABNORMAL LOW (ref >60)
GFR calc Af Amer: 52 mL/min — ABNORMAL LOW (ref >60)
GFR calc non Af Amer: 40 mL/min — ABNORMAL LOW (ref >60)
GFR calc non Af Amer: 45 mL/min — ABNORMAL LOW (ref >60)
Glucose, Bld: 86 mg/dL (ref 70–99)
Glucose, Bld: 96 mg/dL (ref 70–99)
Potassium: 3.9 mmol/L (ref 3.5–5.1)
Potassium: 4.2 mmol/L (ref 3.5–5.1)
Sodium: 123 mmol/L — ABNORMAL LOW (ref 135–145)
Sodium: 130 mmol/L — ABNORMAL LOW (ref 135–145)

## 2020-04-15 LAB — MRSA PCR SCREENING: MRSA by PCR: NEGATIVE

## 2020-04-15 LAB — OSMOLALITY: Osmolality: 268 mOsm/kg — ABNORMAL LOW (ref 275–295)

## 2020-04-15 LAB — TSH: TSH: 0.678 u[IU]/mL (ref 0.350–4.500)

## 2020-04-15 SURGERY — CYSTOSCOPY, WITH RETROGRADE PYELOGRAM AND URETERAL STENT INSERTION
Anesthesia: General | Site: Ureter | Laterality: Right

## 2020-04-15 SURGERY — CYSTOSCOPY, WITH RETROGRADE PYELOGRAM AND URETERAL STENT INSERTION
Anesthesia: General | Laterality: Right

## 2020-04-15 MED ORDER — PROPOFOL 10 MG/ML IV BOLUS
INTRAVENOUS | Status: AC
  Filled 2020-04-15: qty 20

## 2020-04-15 MED ORDER — SODIUM CHLORIDE 0.9 % IV SOLN
INTRAVENOUS | Status: AC
  Filled 2020-04-15: qty 10

## 2020-04-15 MED ORDER — LIDOCAINE 2% (20 MG/ML) 5 ML SYRINGE
INTRAMUSCULAR | Status: DC | PRN
  Administered 2020-04-15: 60 mg via INTRAVENOUS

## 2020-04-15 MED ORDER — IOHEXOL 300 MG/ML  SOLN
INTRAMUSCULAR | Status: DC | PRN
  Administered 2020-04-15: 5 mL

## 2020-04-15 MED ORDER — PHENOL 1.4 % MT LIQD
1.0000 | OROMUCOSAL | Status: DC | PRN
  Filled 2020-04-15: qty 177

## 2020-04-15 MED ORDER — DEXAMETHASONE SODIUM PHOSPHATE 10 MG/ML IJ SOLN
INTRAMUSCULAR | Status: DC | PRN
  Administered 2020-04-15: 8 mg via INTRAVENOUS

## 2020-04-15 MED ORDER — STERILE WATER FOR IRRIGATION IR SOLN
Status: DC | PRN
  Administered 2020-04-15: 3000 mL

## 2020-04-15 MED ORDER — FENTANYL CITRATE (PF) 100 MCG/2ML IJ SOLN: 25.0000 ug | INTRAMUSCULAR | Status: DC | PRN

## 2020-04-15 MED ORDER — PROPOFOL 10 MG/ML IV BOLUS
INTRAVENOUS | Status: DC | PRN
  Administered 2020-04-15: 150 mg via INTRAVENOUS

## 2020-04-15 MED ORDER — FENTANYL CITRATE (PF) 100 MCG/2ML IJ SOLN
INTRAMUSCULAR | Status: AC
  Filled 2020-04-15: qty 2

## 2020-04-15 MED ORDER — DEXMEDETOMIDINE (PRECEDEX) IN NS 20 MCG/5ML (4 MCG/ML) IV SYRINGE
PREFILLED_SYRINGE | INTRAVENOUS | Status: AC
  Filled 2020-04-15: qty 5

## 2020-04-15 MED ORDER — FENTANYL CITRATE (PF) 250 MCG/5ML IJ SOLN
INTRAMUSCULAR | Status: DC | PRN
  Administered 2020-04-15 (×3): 25 ug via INTRAVENOUS
  Administered 2020-04-15: 100 ug via INTRAVENOUS
  Administered 2020-04-15: 25 ug via INTRAVENOUS

## 2020-04-15 MED ORDER — SODIUM CHLORIDE (PF) 0.9 % IJ SOLN
INTRAMUSCULAR | Status: AC
  Filled 2020-04-15: qty 50

## 2020-04-15 MED ORDER — SUCCINYLCHOLINE CHLORIDE 200 MG/10ML IV SOSY
PREFILLED_SYRINGE | INTRAVENOUS | Status: DC | PRN
  Administered 2020-04-15: 100 mg via INTRAVENOUS

## 2020-04-15 MED ORDER — SODIUM CHLORIDE 0.9 % IR SOLN
Status: DC | PRN
  Administered 2020-04-15: 3000 mL

## 2020-04-15 MED ORDER — SODIUM CHLORIDE 0.9 % IV BOLUS
1000.0000 mL | Freq: Once | INTRAVENOUS | Status: AC
  Administered 2020-04-15: 1000 mL via INTRAVENOUS

## 2020-04-15 MED ORDER — LACTATED RINGERS IV SOLN
INTRAVENOUS | Status: DC | PRN
Start: 2020-04-15 — End: 2020-04-15

## 2020-04-15 MED ORDER — ONDANSETRON HCL 4 MG/2ML IJ SOLN
INTRAMUSCULAR | Status: DC | PRN
  Administered 2020-04-15: 4 mg via INTRAVENOUS

## 2020-04-15 MED ORDER — ONDANSETRON HCL 4 MG/2ML IJ SOLN
INTRAMUSCULAR | Status: AC
  Filled 2020-04-15: qty 2

## 2020-04-15 MED ORDER — MIDAZOLAM HCL 2 MG/2ML IJ SOLN
INTRAMUSCULAR | Status: AC
  Filled 2020-04-15: qty 2

## 2020-04-15 MED ORDER — PHENYLEPHRINE 40 MCG/ML (10ML) SYRINGE FOR IV PUSH (FOR BLOOD PRESSURE SUPPORT)
PREFILLED_SYRINGE | INTRAVENOUS | Status: DC | PRN
  Administered 2020-04-15: 80 ug via INTRAVENOUS

## 2020-04-15 MED ORDER — DEXMEDETOMIDINE (PRECEDEX) IN NS 20 MCG/5ML (4 MCG/ML) IV SYRINGE
PREFILLED_SYRINGE | INTRAVENOUS | Status: DC | PRN
  Administered 2020-04-15: 12 ug via INTRAVENOUS

## 2020-04-15 MED ORDER — DEXTROSE 5 % IV SOLN
INTRAVENOUS | Status: DC | PRN
  Administered 2020-04-15: 1 g via INTRAVENOUS

## 2020-04-15 MED ORDER — ONDANSETRON HCL 4 MG/2ML IJ SOLN: 4.0000 mg | Freq: Once | INTRAMUSCULAR | Status: DC | PRN

## 2020-04-15 MED ORDER — HEPARIN SODIUM (PORCINE) 5000 UNIT/ML IJ SOLN
5000.0000 [IU] | Freq: Three times a day (TID) | INTRAMUSCULAR | Status: AC
  Administered 2020-04-15 – 2020-04-16 (×4): 5000 [IU] via SUBCUTANEOUS
  Filled 2020-04-15 (×4): qty 1

## 2020-04-15 MED ORDER — MIDAZOLAM HCL 5 MG/5ML IJ SOLN
INTRAMUSCULAR | Status: DC | PRN
  Administered 2020-04-15: 2 mg via INTRAVENOUS

## 2020-04-15 MED ORDER — DEXAMETHASONE SODIUM PHOSPHATE 10 MG/ML IJ SOLN
INTRAMUSCULAR | Status: AC
  Filled 2020-04-15: qty 1

## 2020-04-15 MED ORDER — IOHEXOL 300 MG/ML  SOLN
75.0000 mL | Freq: Once | INTRAMUSCULAR | Status: AC | PRN
  Administered 2020-04-15: 75 mL via INTRAVENOUS

## 2020-04-15 MED ORDER — SODIUM CHLORIDE 0.9 % IV SOLN: INTRAVENOUS | Status: DC

## 2020-04-15 SURGICAL SUPPLY — 20 items
BAG URINE DRAIN 2000ML AR STRL (UROLOGICAL SUPPLIES) IMPLANT
BAG URO CATCHER STRL LF (MISCELLANEOUS) ×4 IMPLANT
CATH INTERMIT  6FR 70CM (CATHETERS) ×4 IMPLANT
CLOTH BEACON ORANGE TIMEOUT ST (SAFETY) ×4 IMPLANT
ELECT REM PT RETURN 15FT ADLT (MISCELLANEOUS) ×4 IMPLANT
GLOVE BIOGEL M STRL SZ7.5 (GLOVE) ×4 IMPLANT
GOWN STRL REUS W/TWL LRG LVL3 (GOWN DISPOSABLE) ×8 IMPLANT
GUIDEWIRE STR DUAL SENSOR (WIRE) ×4 IMPLANT
GUIDEWIRE ZIPWRE .038 STRAIGHT (WIRE) IMPLANT
KIT TURNOVER KIT A (KITS) IMPLANT
LOOP CUT BIPOLAR 24F LRG (ELECTROSURGICAL) IMPLANT
MANIFOLD NEPTUNE II (INSTRUMENTS) ×4 IMPLANT
PACK CYSTO (CUSTOM PROCEDURE TRAY) ×4 IMPLANT
PENCIL SMOKE EVACUATOR (MISCELLANEOUS) IMPLANT
STENT URET 6FRX26 CONTOUR (STENTS) ×4 IMPLANT
SYR TOOMEY IRRIG 70ML (MISCELLANEOUS)
SYRINGE TOOMEY IRRIG 70ML (MISCELLANEOUS) IMPLANT
TUBING CONNECTING 10 (TUBING) ×3 IMPLANT
TUBING CONNECTING 10' (TUBING) ×1
TUBING UROLOGY SET (TUBING) ×4 IMPLANT

## 2020-04-15 NOTE — Anesthesia Postprocedure Evaluation (Signed)
Anesthesia Post Note  Patient: Eileen Burgess  Procedure(s) Performed: CYSTOSCOPY WITH RETROGRADE PYELOGRAM/URETERAL STENT PLACEMENT (Right Ureter) bladder biopsy with fulguration of bleeders (N/A Bladder)     Patient location during evaluation: PACU Anesthesia Type: General Level of consciousness: awake and alert, oriented and patient cooperative Pain management: pain level controlled Vital Signs Assessment: post-procedure vital signs reviewed and stable Respiratory status: spontaneous breathing, nonlabored ventilation and respiratory function stable Cardiovascular status: blood pressure returned to baseline and stable Postop Assessment: no apparent nausea or vomiting Anesthetic complications: no   No complications documented.  Last Vitals:  Vitals:   04/15/20 1618 04/15/20 1630  BP: 118/90 134/81  Pulse: 86 88  Resp: (!) 21 16  Temp:  36.7 C  SpO2: 100% 100%    Last Pain:  Vitals:   04/15/20 1404  TempSrc: Oral  PainSc:                  Pervis Hocking

## 2020-04-15 NOTE — Op Note (Signed)
Preoperative diagnosis: 1.  Right hydronephrosis 2.  Pelvic mass  Postoperative diagnosis: 1.  Right hydronephrosis 2.  Pelvic mass  Procedures: 1.  Cystoscopy 2.  Right retrograde pyelography with interpretation 3.  Right ureteral stent placement (6 x 26-no string) 4.  Bladder biopsy  Surgeon: Pryor Curia MD  Anesthesia: General  Complications: None  EBL: Minimal  Specimens: 1.  Bladder biopsy  Disposition of specimen: Pathology  Intraoperative findings: Cystoscopy did reveal an abnormal nodular mass pushing on the posterior bladder wall.  This appeared to be extending from an extrinsic origin and toward the bladder.  There were no findings to suggest primary bladder cancer or a papillary tumor.  Right retrograde pyelography was performed with a 6 French ureteral catheter and Omnipaque contrast.  This revealed a distinct transition point from extrinsic compression of the ureter approximately 5 to 6 cm above the ureteral orifice.  No intraluminal filling defects were identified.  Indication: Eileen Burgess is a 64 year old female who was incidentally found to have a pelvic mass concerning for malignancy with omental metastases.  She was also noted to have significant right hydronephrosis.  She presents to undergo right ureteral stent placement and possible biopsy versus transurethral resection of any bladder mass noted.  The potential risks, complications, and the expected recovery process was discussed in detail.  Informed consent was obtained.  Description of procedure: The patient was taken to the operating room and a general anesthetic was administered.  She was given preoperative antibiotics, placed in the dorsolithotomy position, and prepped and draped in the usual sterile fashion.  A preoperative timeout was performed.  Cystourethroscopy was then performed.  The bladder was examined and the posterior bladder was somewhat distorted.  In particular, the right ureteral  orifice was distorted by extrinsic mass-effect.  There was also noted to be a nodular mass pushing on the posterior bladder that appeared to be from an extrinsic source the potentially invading the posterior bladder wall.  Attention then turned to the right ureteral orifice.  This was intubated with a 6 French ureteral catheter and Omnipaque contrast was injected to perform a right retrograde pyelogram.  Findings are as dictated above.  A 0.38 sensor guidewire was then advanced up into the right renal collecting system without significant difficulty.  A 6 x 26 double-J ureteral stent was then advanced over the wire using Seldinger technique and positioned appropriately within the renal pelvis and the bladder.  The wire was removed with a good curl noted in the renal pelvis as well as within the bladder.  Attention then turned to the posterior bladder and the nodular mass was identified.  A cold cup biopsy forceps was used to take biopsies of this area which measured approximately 1.5 to 2 cm.  A Bugbee electrode was used to perform fulguration.  Hemostasis was achieved.  The bladder was emptied and the procedure was ended.  She tolerated procedure well without complications.  She was able to be awakened and transferred to the recovery unit in satisfactory condition.

## 2020-04-15 NOTE — Progress Notes (Addendum)
[facility]                                                                              Patient Demographics  Eileen Burgess, is a 64 y.o. female, DOB - 1956-07-07, BVQ:[id number]  Admit date - 04/13/2020   Admitting Physician Guilford Shi, MD  Outpatient Primary MD for the patient is Patient, No Pcp Per  Outpatient specialists:   LOS - 2  days   Medical records reviewed and are as summarized below:    Chief complaint: 3 weeks of episodic diarrhea, nausea, decreased oral intake, dysuria.      Brief summary   64 y.o.femalepast medical history of CAD status post angioplasty to the LAD, essential hypertension history of tobacco abuse she is currently non-smoking, GERD that comes into the hospital for 3 weeks of episodic diarrhea that has progressively gotten worse, she has not received any antibiotics over the last year, she is also been nauseated with loss of appetite and decreased oral intake despite this she has continued to take her antihypertensive medication, she does relate that on the day of admission she started having some dysuria,she denies any urgency frequency hematuria or hematochezia. She relates some dizziness upon standing she relates she received a Covid vaccine more than 8 months ago. ED Course: Afebrile,Vital signs are stable- saturations are greater than 97%, she was found to be hyponatremic, new acute renal failure with elevated AST and ALT unremarkable, UA  positive for bacteria and hemoglobin large, WBC/RBC>  50    Assessment & Plan    Abdominal pain with dysuria, AKI, right hydronephrosis,  Metastatic omental carcinomatosis of unknown primary -CT abdomen pelvis showed possible neoplastic processes arising from the bladder urothelium or from cervix and invading into the posterior bladder wall with obstruction of right UVJ or distal right ureter, moderate right hydronephrosis, diffuse omental nodularity and caking consistent with  metastatic disease. Moderate right and small left pleural effusions, associated partial compressive atelectasis of lower lobes.  Discussed with the patient, no prior history of malignancy (just had a history of ovarian cyst at the age of 27 years). - will consult urology (possibly needs right ureteral stent), discussed with Dr. Alinda Money, recommended n.p.o. for urological procedure today.  Cystoscopy will be able to tell if coming from bladder versus extrinsic (?gynecological) -IR consulted for biopsy of the omental caking ?  Metastatic.  -Ordered CA 19-9, CEA, CA125 -Discussed with oncology on-call, Dr. Julien Nordmann, also recommended CT chest with contrast for staging.  If urological malignancy ruled out by cystoscopy, will need GYN ONC evaluation, will discuss with Dr. Denman George.  Hyponatremia -Appears to be hypovolemic related to recent diarrhea and poor p.o. intake, HCTZ use vs SIADH from ?malignancy.  Serum osmolality 268, urine osmolality 617, urine sodium 50 -Hold diuretics, continue IV fluid hydration for now -Trending down to 123 ( <-127)  Acute kidney injury -In the setting of #1, dehydration, HCTZ use -Continue IV fluid hydration, hold lisinopril, HCTZ -Creatinine improving 1.36, plan for ureteral stent today  Hypertension -Currently stable, continue metoprolol, avoid diuretics, ACE inhibitor  CAD status post PTCA Continue metoprolol, aspirin  GERD Continue PPI  Obesity  Estimated body mass index is 37.09 kg/m as calculated from the following:   Height as of this encounter: 5\' 4"  (1.626 m).   Weight as of this encounter: 98 kg.  Code Status: Full code DVT Prophylaxis: Change to heparin subcu Family Communication: Discussed all imaging results, lab results, explained to the patient and son, Roderic Palau on the phone   Disposition Plan:     Status is: Inpatient  Remains inpatient appropriate because:Inpatient level of care appropriate due to severity of illness   Dispo: The  patient is from: Home              Anticipated d/c is to: Home              Anticipated d/c date is: > 3 days              Patient currently is not medically stable to d/c.      Time Spent in minutes 45 minutes  Procedures:  CT renal stone study  Consultants:   Urology IR Oncology  Antimicrobials:   Anti-infectives (From admission, onward)   Start     Dose/Rate Route Frequency Ordered Stop   04/13/20 1700  cefTRIAXone (ROCEPHIN) 1 g in sodium chloride 0.9 % 100 mL IVPB     Discontinue     1 g 200 mL/hr over 30 Minutes Intravenous Every 24 hours 04/13/20 1635 04/17/20 1659          Medications  Scheduled Meds: . enoxaparin (LOVENOX) injection  40 mg Subcutaneous Q24H  . metoprolol succinate  25 mg Oral Daily  . pantoprazole  40 mg Oral Daily   Continuous Infusions: . sodium chloride    . cefTRIAXone (ROCEPHIN)  IV 200 mL/hr at 04/14/20 1725   PRN Meds:.acetaminophen **OR** acetaminophen, hydrALAZINE, nitroGLYCERIN, ondansetron **OR** ondansetron (ZOFRAN) IV      Subjective:   Makaylia Hewett was seen and examined today.  Appetite still poor, no active nausea vomiting or diarrhea.  No chest pain or shortness of breath.  No fevers.  No acute events overnight.    Objective:   Vitals:   04/14/20 1342 04/14/20 2031 04/15/20 0522 04/15/20 1105  BP: 110/66 122/74 (!) 145/64 139/81  Pulse: 98 86 83 75  Resp:  14 18 18   Temp:  98.6 F (37 C) 98 F (36.7 C) 98.1 F (36.7 C)  TempSrc:  Oral Oral Oral  SpO2: 98% 93% 91% 97%  Weight:      Height:        Intake/Output Summary (Last 24 hours) at 04/15/2020 1119 Last data filed at 04/15/2020 1019 Gross per 24 hour  Intake 1661.1 ml  Output --  Net 1661.1 ml     Wt Readings from Last 3 Encounters:  04/13/20 98 kg  06/24/19 98 kg  06/16/18 97.9 kg     Exam  General: Alert and oriented x 3, NAD  Cardiovascular: S1 S2 auscultated, no murmurs, RRR  Respiratory: Clear to auscultation bilaterally, no  wheezing, rales or rhonchi  Gastrointestinal: Soft, nontender, nondistended, + bowel sounds  Ext: no pedal edema bilaterally  Neuro: no new deficits  Musculoskeletal: No digital cyanosis, clubbing  Skin: No rashes  Psych: Normal affect and demeanor, alert and oriented x3    Data Reviewed:  I have personally reviewed following labs and imaging studies  Micro Results Recent Results (from the past 240 hour(s))  Urine culture     Status: Abnormal   Collection Time: 04/13/20  1:20 PM   Specimen: Urine,  Random  Result Value Ref Range Status   Specimen Description   Final    URINE, RANDOM Performed at [facility] [address] [place], [place] [address]    Special Requests   Final    NONE Performed at [facility], [place] [address] [place], [place] [address]    Culture (A)  Final    20,000 COLONIES/mL MULTIPLE SPECIES PRESENT, SUGGEST RECOLLECTION   Report Status 04/14/2020 FINAL  Final  SARS Coronavirus 2 by RT PCR (hospital order, performed in [facility] hospital lab) Nasopharyngeal Nasopharyngeal Swab     Status: None   Collection Time: 04/13/20  2:13 PM   Specimen: Nasopharyngeal Swab  Result Value Ref Range Status   SARS Coronavirus 2 NEGATIVE NEGATIVE Final    Comment: (NOTE) SARS-CoV-2 target nucleic acids are NOT DETECTED.  The SARS-CoV-2 RNA is generally detectable in upper and lower respiratory specimens during the acute phase of infection. The lowest concentration of SARS-CoV-2 viral copies this assay can detect is 250 copies / mL. A negative result does not preclude SARS-CoV-2 infection and should not be used as the sole basis for treatment or other patient management decisions.  A negative result may occur with improper specimen collection / handling, submission of specimen other than nasopharyngeal swab, presence of viral mutation(s) within the areas targeted by this assay, and inadequate number of viral copies (<250  copies / mL). A negative result must be combined with clinical observations, patient history, and epidemiological information.  Fact Sheet for Patients:   StrictlyIdeas.no  Fact Sheet for Healthcare Providers: BankingDealers.co.za  This test is not yet approved or  cleared by the [place] FDA and has been authorized for detection and/or diagnosis of SARS-CoV-2 by FDA under an Emergency Use Authorization (EUA).  This EUA will remain in effect (meaning this test can be used) for the duration of the COVID-19 declaration under Section 564(b)(1) of the Act, 21 U.S.C. section 360bbb-3(b)(1), unless the authorization is terminated or revoked sooner.  Performed at [facility], [place] [address] [place], [place] [address]   Culture, blood (Routine X 2) w Reflex to ID Panel     Status: None (Preliminary result)   Collection Time: 04/13/20  6:28 PM   Specimen: BLOOD  Result Value Ref Range Status   Specimen Description   Final    BLOOD RIGHT ANTECUBITAL Performed at [facility], [place] [address] [place], [place] [address]    Special Requests   Final    BOTTLES DRAWN AEROBIC AND ANAEROBIC Blood Culture adequate volume Performed at [facility] [address], [place], [place] [address]    Culture   Final    NO GROWTH < 12 HOURS Performed at [facility] [address], [place], [place] [address]    Report Status PENDING  Incomplete  Culture, blood (Routine X 2) w Reflex to ID Panel     Status: None (Preliminary result)   Collection Time: 04/13/20  6:40 PM   Specimen: BLOOD RIGHT HAND  Result Value Ref Range Status   Specimen Description   Final    BLOOD RIGHT HAND Performed at [facility] [address], [place], [place] [address]    Special Requests   Final    BOTTLES DRAWN AEROBIC AND ANAEROBIC Blood Culture adequate volume Performed at [facility] [address], [place],  [address]    Culture   Final  NO GROWTH < 12 HOURS Performed at [facility] [address], [place], [place] [address]    Report Status PENDING  Incomplete    Radiology Reports DG Chest 2 View  Result Date: 04/13/2020 CLINICAL DATA:  Weakness diarrhea EXAM: CHEST - 2 VIEW COMPARISON:  07/28/2004 FINDINGS: Heart size upper normal.  Vascularity normal.  Coronary stent noted. Small bilateral pleural effusions. Mild bibasilar atelectasis/infiltrate. IMPRESSION: Interval development of mild bibasilar airspace disease and small pleural effusions. Negative for heart failure. Electronically Signed   By: Franchot Gallo M.D.   On: 04/13/2020 13:57   CT RENAL STONE STUDY  Result Date: 04/14/2020 CLINICAL DATA:  64 year old female with hematuria. EXAM: CT ABDOMEN AND PELVIS WITHOUT CONTRAST TECHNIQUE: Multidetector CT imaging of the abdomen and pelvis was performed following the standard protocol without IV contrast. COMPARISON:  None. FINDINGS: Evaluation of this exam is limited in the absence of intravenous contrast. Lower chest: Partially visualized moderate right and small left pleural effusions. There is associated partial compressive atelectasis of the lower lobes. Pneumonia is not excluded. Clinical correlation is recommended. There is coronary vascular calcification. Partially visualized trace pericardial effusion. There is no intra-abdominal free air. Small ascites. Hepatobiliary: No focal liver abnormality is seen. No gallstones, gallbladder wall thickening, or biliary dilatation. Pancreas: Unremarkable. No pancreatic ductal dilatation or surrounding inflammatory changes. Spleen: Normal in size without focal abnormality. Adrenals/Urinary Tract: The adrenal glands unremarkable. The left kidney is unremarkable. There is a moderate right hydronephrosis with mild right hydroureter. No stone identified. There is a transition point in the distal right  ureter or right UVJ. The urinary bladder is minimally distended. There is possible mild nodular thickening of the posterior bladder wall adjacent to the right UVJ (77/2). There is loss of fat plane between the anterior lower uterus/cervix and posterior bladder wall. The lower uterus/cervical region is somewhat enlarged. Findings concerning for a neoplastic process either arising from the bladder urothelium or from the cervix and invading into the posterior bladder wall with obstruction of the right UVJ or distal right ureter. Stomach/Bowel: There is a small hiatal hernia. There is no bowel obstruction. There is sigmoid diverticulosis. The appendix is not visualized with certainty. No inflammatory changes identified in the right lower quadrant. Vascular/Lymphatic: Moderate aortoiliac atherosclerotic disease. The IVC is unremarkable. No portal venous gas. There is no adenopathy. Reproductive: The uterus is anteverted. Enlargement of the lower uterus/cervical region as described above. Other: There is diffuse omental nodularity and caking consistent with metastatic disease. Musculoskeletal: Osteopenia. No acute osseous pathology. IMPRESSION: 1. Findings concerning for a neoplastic process either arising from the bladder urothelium or from the cervix and invading into the posterior bladder wall with obstruction of the right UVJ or distal right ureter. There is associated moderate right hydronephrosis. Sampling of the ascitic fluid may provide further diagnostic information. 2. Diffuse omental nodularity and caking consistent with metastatic disease. 3. Moderate right and small left pleural effusions with associated partial compressive atelectasis of the lower lobes. Pneumonia is not excluded. Clinical correlation is recommended. 4. Sigmoid diverticulosis. No bowel obstruction. 5. Aortic Atherosclerosis (ICD10-I70.0). Electronically Signed   By: Anner Crete M.D.   On: 04/14/2020 17:44    Lab Data:  CBC: Recent  Labs  Lab 04/13/20 1330 04/13/20 1829 04/14/20 0535  WBC 8.8 7.7 5.6  NEUTROABS 7.0  --   --   HGB 13.4 12.7 11.9*  HCT 39.3 38.3 35.6*  MCV 81.0 83.1 83.2  PLT 347 325 302   Basic  Metabolic Panel: Recent Labs  Lab 04/13/20 1330 04/13/20 1330 04/13/20 1829 04/14/20 0535 04/14/20 1215 04/14/20 1802 04/15/20 0530  NA 123*  --   --  127* 128* 127* 123*  K 4.1  --   --  4.3 4.3 4.2 3.9  CL 93*  --   --  96* 98 97* 97*  CO2 19*  --   --  21* 20* 21* 18*  GLUCOSE 102*  --   --  87 96 89 86  BUN 19  --   --  16 15 14 12   CREATININE 1.51*   < > 1.50* 1.45* 1.46* 1.42* 1.36*  CALCIUM 8.8*  --   --  8.4* 8.3* 8.3* 8.0*   < > = values in this interval not displayed.   GFR: Estimated Creatinine Clearance: 46.3 mL/min (A) (by C-G formula based on SCr of 1.36 mg/dL (H)). Liver Function Tests: Recent Labs  Lab 04/13/20 1330  AST 14*  ALT 12  ALKPHOS 65  BILITOT 0.7  PROT 7.1  ALBUMIN 3.4*   Recent Labs  Lab 04/13/20 1330  LIPASE 27   No results for input(s): AMMONIA in the last 168 hours. Coagulation Profile: No results for input(s): INR, PROTIME in the last 168 hours. Cardiac Enzymes: No results for input(s): CKTOTAL, CKMB, CKMBINDEX, TROPONINI in the last 168 hours. BNP (last 3 results) No results for input(s): PROBNP in the last 8760 hours. HbA1C: No results for input(s): HGBA1C in the last 72 hours. CBG: No results for input(s): GLUCAP in the last 168 hours. Lipid Profile: No results for input(s): CHOL, HDL, LDLCALC, TRIG, CHOLHDL, LDLDIRECT in the last 72 hours. Thyroid Function Tests: Recent Labs    04/15/20 0530  TSH 0.678   Anemia Panel: No results for input(s): VITAMINB12, FOLATE, FERRITIN, TIBC, IRON, RETICCTPCT in the last 72 hours. Urine analysis:    Component Value Date/Time   COLORURINE YELLOW 04/13/2020 1320   APPEARANCEUR HAZY (A) 04/13/2020 1320   LABSPEC 1.019 04/13/2020 1320   PHURINE 5.0 04/13/2020 1320   GLUCOSEU NEGATIVE 04/13/2020  1320   HGBUR LARGE (A) 04/13/2020 1320   BILIRUBINUR NEGATIVE 04/13/2020 1320   KETONESUR 5 (A) 04/13/2020 1320   PROTEINUR 100 (A) 04/13/2020 1320   NITRITE NEGATIVE 04/13/2020 1320   LEUKOCYTESUR LARGE (A) 04/13/2020 1320     Swayzee Wadley M.D. [facility] 04/15/2020, 11:19 AM   Call night coverage person covering after 7pm

## 2020-04-15 NOTE — Consult Note (Signed)
Chief Complaint: Patient was seen in consultation today for omental caking/biopsy.  Referring Physician(s): Rai, Ripudeep K  Supervising Physician: Aletta Edouard  Patient Status: [facility] - In-pt  History of Present Illness: Eileen Burgess is a 64 y.o. female with a past medical history of hypertension, dyslipidemia, MI, CAD s/p LAD angioplasty, GERD, insomnia, and prior tobacco abuse. She presented to [facility] ED 04/13/2020 with complaints of malaise and fatigue. In ED, UA consistent with UTI and AKI. She was admitted for further management. CT renal study revealed right UVJ obstruction along with omental caking suspicious for metastatic disease.  CT renal study 04/14/2020: 1. Findings concerning for a neoplastic process either arising from the bladder urothelium or from the cervix and invading into the posterior bladder wall with obstruction of the right UVJ or distal right ureter. There is associated moderate right hydronephrosis. Sampling of the ascitic fluid may provide further diagnostic information. 2. Diffuse omental nodularity and caking consistent with metastatic disease. 3. Moderate right and small left pleural effusions with associated partial compressive atelectasis of the lower lobes. Pneumonia is not excluded. Clinical correlation is recommended. 4. Sigmoid diverticulosis. No bowel obstruction. 5. Aortic Atherosclerosis (ICD10-I70.0).  IR requested by Dr. Tana Coast for possible image-guided omental biopsy for tissue diagnosis. Patient awake and alert laying in bed with no complaints at this time. Denies fever, chills, chest pain, dyspnea, abdominal pain, or headache.   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 Dominant RCA is a large PDA and posterolateral branch.   NM MYOVIEW LTD  10/2012   8:50 min; 9 METS.  No ischemia or Infarction; EF ~68%   OVARIAN CYST REMOVAL  12/1968    Allergies: Shrimp [shellfish allergy], Crestor [rosuvastatin], Pravastatin, Vytorin [ezetimibe-simvastatin], and Wasp venom  Medications: Prior to Admission medications   Medication Sig Start Date End Date Taking? Authorizing Provider  acetaminophen (TYLENOL) 500 MG tablet Take 500-1,000 mg by mouth every 6 (six) hours as needed for mild pain or moderate pain.   Yes [provider]  hydrochlorothiazide (MICROZIDE) 12.5 MG capsule TAKE 1 CAPSULE BY MOUTH DAILY Patient taking differently: Take 12.5 mg by mouth daily.  03/07/20  Yes [name], MD  Ibuprofen-Acetaminophen (ADVIL DUAL ACTION) 125-250 MG TABS Take 2 tablets by mouth every 8 (eight) hours as needed (pain).   Yes [provider]  lisinopril (ZESTRIL) 20 MG tablet TAKE 1 TABLET BY MOUTH DAILY Patient taking differently: Take 20 mg by mouth daily.  03/07/20  Yes [name], MD  metoprolol succinate (TOPROL-XL) 25 MG 24 hr tablet Take 1 tablet (25 mg total) by mouth daily. 09/08/19  Yes [name], MD  nitroGLYCERIN (NITROSTAT) 0.4 MG SL tablet TAKE 1 TABLET UNDER THE TONGUE AS NEEDEDFOR CHEST PAIN UP TO 3 TIMES AN EPISODE Patient taking differently: Place 0.4 mg under the tongue every 5 (five) minutes x 3 doses as  needed for chest pain.  06/16/18  Yes [name], MD  Alirocumab (PRALUENT) 150 MG/ML SOAJ Inject 150 mg into the skin every 14 (fourteen) days. Patient not taking: Reported on 04/13/2020 06/02/19   [name], MD     Family History  Problem Relation Age of  Onset   Stroke Mother    Hypertension Mother    Hyperlipidemia Father    Hyperlipidemia Son        not known, pt assumes because of poor diet    Social History   Socioeconomic History   Marital status: Married    Spouse name: Not on file   Number of children: Not on file   Years of education: Not on file   Highest education level: Not on file  Occupational History   Not on file  Tobacco Use   Smoking status: Former Smoker    Quit date: 07/03/2004    Years since quitting: 15.7   Smokeless tobacco: Never Used  Substance and Sexual Activity   Alcohol use: No   Drug use: No   Sexual activity: Not on file  Other Topics Concern   Not on file  Social History Narrative   Widowed.   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   Has been quite active, but no routine exercise.   Quit Smoking in 2005 before her MI.  Does not drink alcohol.   Social Determinants of Health   Financial Resource Strain:    Difficulty of Paying Living Expenses:   Food Insecurity:    Worried About Charity fundraiser in the Last Year:    Arboriculturist in the Last Year:   Transportation Needs:    Film/video editor (Medical):    Lack of Transportation (Non-Medical):   Physical Activity:    Days of Exercise per Week:    Minutes of Exercise per Session:   Stress:    Feeling of Stress :   Social Connections:    Frequency of Communication with Friends and Family:    Frequency of Social Gatherings with Friends and Family:    Attends Religious Services:    Active Member of Clubs or Organizations:    Attends Archivist Meetings:    Marital Status:      Review of Systems: A 12 point ROS discussed and pertinent positives are indicated in the HPI above.  All other systems are negative.  Review of Systems  Constitutional: Negative for chills and fever.  Respiratory: Negative for shortness of  breath and wheezing.   Cardiovascular: Negative for chest pain and palpitations.  Gastrointestinal: Negative for abdominal pain.  Neurological: Negative for headaches.  Psychiatric/Behavioral: Negative for behavioral problems and confusion.    Vital Signs: BP 139/81 (BP Location: Right Arm)    Pulse 75    Temp 98.1 F (36.7 C) (Oral)    Resp 18    Ht 5\' 4"  (1.626 m)    Wt 216 lb 0.8 oz (98 kg)    LMP  (LMP Unknown)    SpO2 97%    BMI 37.09 kg/m   Physical Exam Vitals and nursing note reviewed.  Constitutional:      General: She is not in acute distress.    Appearance: Normal appearance.  Cardiovascular:     Rate and Rhythm: Normal rate and regular rhythm.     Heart sounds: Normal heart sounds. No murmur heard.  Pulmonary:     Effort: Pulmonary effort is normal. No respiratory distress.     Breath sounds: Normal breath sounds. No wheezing.  Skin:    General: Skin is warm and dry.  Neurological:     Mental Status: She is alert and oriented to person, place, and time.      MD Evaluation Airway: WNL Heart: WNL Abdomen: WNL Chest/ Lungs: WNL ASA  Classification: 2 Mallampati/Airway Score: Two   Imaging: DG Chest 2 View  Result Date: 04/13/2020 CLINICAL DATA:  Weakness diarrhea EXAM: CHEST - 2 VIEW COMPARISON:  07/28/2004 FINDINGS: Heart size upper normal.  Vascularity normal.  Coronary stent noted. Small bilateral pleural effusions. Mild bibasilar atelectasis/infiltrate. IMPRESSION: Interval development of mild bibasilar airspace disease and small pleural effusions. Negative for heart failure. Electronically Signed   By: Franchot Gallo M.D.   On: 04/13/2020 13:57   CT RENAL STONE STUDY  Result Date: 04/14/2020 CLINICAL DATA:  64 year old female with hematuria. EXAM: CT ABDOMEN AND PELVIS WITHOUT CONTRAST TECHNIQUE: Multidetector CT imaging of the abdomen and pelvis was performed following the standard protocol without IV contrast. COMPARISON:  None. FINDINGS: Evaluation of  this exam is limited in the absence of intravenous contrast. Lower chest: Partially visualized moderate right and small left pleural effusions. There is associated partial compressive atelectasis of the lower lobes. Pneumonia is not excluded. Clinical correlation is recommended. There is coronary vascular calcification. Partially visualized trace pericardial effusion. There is no intra-abdominal free air. Small ascites. Hepatobiliary: No focal liver abnormality is seen. No gallstones, gallbladder wall thickening, or biliary dilatation. Pancreas: Unremarkable. No pancreatic ductal dilatation or surrounding inflammatory changes. Spleen: Normal in size without focal abnormality. Adrenals/Urinary Tract: The adrenal glands unremarkable. The left kidney is unremarkable. There is a moderate right hydronephrosis with mild right hydroureter. No stone identified. There is a transition point in the distal right ureter or right UVJ. The urinary bladder is minimally distended. There is possible mild nodular thickening of the posterior bladder wall adjacent to the right UVJ (77/2). There is loss of fat plane between the anterior lower uterus/cervix and posterior bladder wall. The lower uterus/cervical region is somewhat enlarged. Findings concerning for a neoplastic process either arising from the bladder urothelium or from the cervix and invading into the posterior bladder wall with obstruction of the right UVJ or distal right ureter. Stomach/Bowel: There is a small hiatal hernia. There is no bowel obstruction. There is sigmoid diverticulosis. The appendix is not visualized with certainty. No inflammatory changes identified in the right lower quadrant. Vascular/Lymphatic: Moderate aortoiliac atherosclerotic disease. The IVC is unremarkable. No portal venous gas. There is no adenopathy. Reproductive: The uterus is anteverted. Enlargement of the lower uterus/cervical region as described above. Other: There is diffuse omental  nodularity and caking consistent with metastatic disease. Musculoskeletal: Osteopenia. No acute osseous pathology. IMPRESSION: 1. Findings concerning for a neoplastic process either arising from the bladder urothelium or from the cervix and invading into the posterior bladder wall with obstruction of the right UVJ or distal right ureter. There is associated moderate right hydronephrosis. Sampling of the ascitic fluid may provide further diagnostic information. 2. Diffuse omental nodularity and caking consistent with metastatic disease. 3. Moderate right and small left pleural effusions with associated partial compressive atelectasis of the lower lobes. Pneumonia is not excluded. Clinical correlation is recommended. 4. Sigmoid diverticulosis. No bowel obstruction. 5. Aortic Atherosclerosis (ICD10-I70.0). Electronically Signed   By: Anner Crete M.D.   On: 04/14/2020 17:44    Labs:  CBC:  Recent Labs    04/13/20 1330 04/13/20 1829 04/14/20 0535  WBC 8.8 7.7 5.6  HGB 13.4 12.7 11.9*  HCT 39.3 38.3 35.6*  PLT 347 325 302    COAGS: No results for input(s): INR, APTT in the last 8760 hours.  BMP: Recent Labs    04/14/20 1215 04/14/20 1802 04/15/20 0530 04/15/20 1212  NA 128* 127* 123* 130*  K 4.3 4.2 3.9 4.2  CL 98 97* 97* 101  CO2 20* 21* 18* 19*  GLUCOSE 96 89 86 96  BUN 15 14 12 12   CALCIUM 8.3* 8.3* 8.0* 8.0*  CREATININE 1.46* 1.42* 1.36* 1.25*  GFRNONAA 37* 38* 40* 45*  GFRAA 43* 44* 47* 52*    LIVER FUNCTION TESTS: Recent Labs    04/13/20 1330  BILITOT 0.7  AST 14*  ALT 12  ALKPHOS 65  PROT 7.1  ALBUMIN 3.4*     Assessment and Plan:  Omental caking, suspicious for metastatic disease, seen on CT renal study 04/14/2020. Plan for image-guided omental biopsy in IR tentatively for Monday 04/17/2020 pending IR scheduling. Case/images have been reviewed by Dr. Kathlene Cote who approves procedure. Patient will be NPO at midnight prior to procedure. Afebrile and WBCs  WNL. Will hold Heparin SQ per IR protocol. INR pending.  Risks and benefits discussed with the patient including, but not limited to bleeding, infection, damage to adjacent structures or low yield requiring additional tests. All of the patient's questions were answered, patient is agreeable to proceed. Consent signed and in chart.   Thank you for this interesting consult.  I greatly enjoyed meeting Eileen Burgess and look forward to participating in their care.  A copy of this report was sent to the requesting provider on this date.  Electronically Signed: Earley Abide, PA-C 04/15/2020, 1:10 PM   I spent a total of 40 Minutes in face to face in clinical consultation, greater than 50% of which was counseling/coordinating care for omental caking/biopsy.

## 2020-04-15 NOTE — Transfer of Care (Signed)
Immediate Anesthesia Transfer of Care Note  Patient: Eileen Burgess  Procedure(s) Performed: Procedure(s): CYSTOSCOPY WITH RETROGRADE PYELOGRAM/URETERAL STENT PLACEMENT (Right) bladder biopsy with fulguration of bleeders (N/A)  Patient Location: PACU  Anesthesia Type:General  Level of Consciousness:  sedated, patient cooperative and responds to stimulation  Airway & Oxygen Therapy:Patient Spontanous Breathing and Patient connected to face mask oxgen  Post-op Assessment:  Report given to PACU RN and Post -op Vital signs reviewed and stable  Post vital signs:  Reviewed and stable  Last Vitals:  Vitals:   04/15/20 1105 04/15/20 1404  BP: 139/81 (!) 145/78  Pulse: 75 74  Resp: 18 16  Temp: 36.7 C 36.8 C  SpO2: 57% 84%    Complications: No apparent anesthesia complications

## 2020-04-15 NOTE — Anesthesia Preprocedure Evaluation (Addendum)
Anesthesia Evaluation  Patient identified by MRN, date of birth, ID band Patient awake    Reviewed: Allergy & Precautions, NPO status , Patient's Chart, lab work & pertinent test results, reviewed documented beta blocker date and time   Airway Mallampati: II  TM Distance: >3 FB Neck ROM: Full    Dental no notable dental hx. (+) Chipped, Dental Advisory Given, Poor Dentition,    Pulmonary former smoker,  Quit smoking 2005   Pulmonary exam normal breath sounds clear to auscultation       Cardiovascular hypertension, Pt. on medications and Pt. on home beta blockers + CAD  Normal cardiovascular exam Rhythm:Regular Rate:Normal  2005: AWMI, PCI to pLAD 99%; 3.0 mm x 38 mm Cypher DES  Stress test 2014: nml   Neuro/Psych PSYCHIATRIC DISORDERS Anxiety negative neurological ROS     GI/Hepatic Neg liver ROS, GERD  Medicated and Controlled,  Endo/Other  Obesity BMI 37  Renal/GU Renal diseaseCr 1.36 Pelvic mass, right ureteral obstruction  negative genitourinary   Musculoskeletal negative musculoskeletal ROS (+)   Abdominal (+) + obese,   Peds  Hematology  (+) Blood dyscrasia, anemia ,  hct 35.6   Anesthesia Other Findings   Reproductive/Obstetrics negative OB ROS                           Anesthesia Physical Anesthesia Plan  ASA: III  Anesthesia Plan: General   Post-op Pain Management:    Induction: Intravenous and Rapid sequence  PONV Risk Score and Plan: 3 and Ondansetron, Dexamethasone, Midazolam and Treatment may vary due to age or medical condition  Airway Management Planned: Oral ETT  Additional Equipment: None  Intra-op Plan:   Post-operative Plan: Extubation in OR  Informed Consent: I have reviewed the patients History and Physical, chart, labs and discussed the procedure including the risks, benefits and alternatives for the proposed anesthesia with the patient or authorized  representative who has indicated his/her understanding and acceptance.     Dental advisory given  Plan Discussed with: CRNA  Anesthesia Plan Comments:       Anesthesia Quick Evaluation

## 2020-04-15 NOTE — Anesthesia Procedure Notes (Addendum)
Procedure Name: Intubation Date/Time: 04/15/2020 3:39 PM Performed by: Anne Fu, CRNA Pre-anesthesia Checklist: Patient identified, Emergency Drugs available, Suction available, Patient being monitored and Timeout performed Patient Re-evaluated:Patient Re-evaluated prior to induction Oxygen Delivery Method: Circle system utilized Preoxygenation: Pre-oxygenation with 100% oxygen Induction Type: IV induction, Rapid sequence and Cricoid Pressure applied Laryngoscope Size: Mac and 4 Grade View: Grade I Tube type: Oral Tube size: 7.5 mm Number of attempts: 1 Airway Equipment and Method: Stylet Placement Confirmation: ETT inserted through vocal cords under direct vision,  positive ETCO2 and breath sounds checked- equal and bilateral Secured at: 21 cm Tube secured with: Tape Dental Injury: Teeth and Oropharynx as per pre-operative assessment

## 2020-04-15 NOTE — Addendum Note (Signed)
Addendum  created 04/15/20 1659 by Pervis Hocking, DO   Order list changed

## 2020-04-15 NOTE — Consult Note (Signed)
Urology Consult   Physician requesting consult: Dr. Tana Coast  Reason for consult: Right hydronephrosis and pelvic mass  History of Present Illness: Eileen Burgess is a 64 y.o. who presented to the hospital due to a couple of weeks of worsening diarrhea, poor appetite, and general malaise.  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  She has denied any hematuria or change in voiding habits.  She does have some baseline urinary urgency and urge incontinence.  She does have a prior history of smoking but quit around 2005 when she underwent cardiac stent placement.  No family history of GU malignancy.  She does state that she has noted some increasing pelvic pain with radiation to her right abdomen/flank.  She is Covid negative.    Past Medical History:  Diagnosis Date  . CAD S/P percutaneous coronary angioplasty 2005   PCI to pLAD 99%; 3.0 mm x 38 mm Cypher DES; EF ~45% - distal, septal apical hypokinesis    . Dyslipidemia, goal LDL below 70   . Essential hypertension   . GERD (gastroesophageal reflux disease)   . 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 Insomnia     Past Surgical History:  Procedure Laterality Date  . CESAREAN SECTION  05/18/1983  . CORONARY ANGIOPLASTY WITH STENT PLACEMENT  2005   prox LAD 3.0 mm x 28 mm Cypher DES; circumflex is nondominant with 2 large EOMs. Dominant RCA is a large PDA and posterolateral branch.  . NM MYOVIEW LTD  10/2012   8:50 min; 9 METS.  No ischemia or Infarction; EF ~68%  . OVARIAN CYST REMOVAL  12/1968     Current Hospital Medications:  Home meds:  No current facility-administered medications on file prior to encounter.   Current Outpatient Medications on File Prior to Encounter  Medication Sig Dispense Refill  . acetaminophen (TYLENOL) 500 MG  tablet Take 500-1,000 mg by mouth every 6 (six) hours as needed for mild pain or moderate pain.    . hydrochlorothiazide (MICROZIDE) 12.5 MG capsule TAKE 1 CAPSULE BY MOUTH DAILY (Patient taking differently: Take 12.5 mg by mouth daily. ) 90 capsule 1  . Ibuprofen-Acetaminophen (ADVIL DUAL ACTION) 125-250 MG TABS Take 2 tablets by mouth every 8 (eight) hours as needed (pain).    Marland Kitchen lisinopril (ZESTRIL) 20 MG tablet TAKE 1 TABLET BY MOUTH DAILY (Patient taking differently: Take 20 mg by mouth daily. ) 90 tablet 1  . metoprolol succinate (TOPROL-XL) 25 MG 24 hr tablet Take 1 tablet (25 mg total) by mouth daily. 90 tablet 3  . nitroGLYCERIN (NITROSTAT) 0.4 MG SL tablet TAKE 1 TABLET UNDER THE TONGUE AS NEEDEDFOR CHEST PAIN UP TO 3 TIMES AN EPISODE (Patient taking differently: Place 0.4 mg under the tongue every 5 (five) minutes x 3 doses as needed for chest pain. ) 25 tablet 3  . Alirocumab (PRALUENT) 150 MG/ML SOAJ Inject 150 mg into the skin every 14 (fourteen) days. (Patient not taking: Reported on 04/13/2020) 2 pen 11     Scheduled Meds: . heparin injection (subcutaneous)  5,000 Units Subcutaneous Q8H  . metoprolol succinate  25 mg Oral Daily  . pantoprazole  40 mg Oral Daily   Continuous Infusions: . sodium chloride    . cefTRIAXone (ROCEPHIN)  IV 200 mL/hr at 04/14/20 1725   PRN Meds:.acetaminophen **OR** acetaminophen, hydrALAZINE, nitroGLYCERIN,  ondansetron **OR** ondansetron (ZOFRAN) IV  Allergies:  Allergies  Allergen Reactions  . Shrimp [Shellfish Allergy] Anaphylaxis  . Crestor [Rosuvastatin] Other (See Comments)    LEGS HURTING  . Pravastatin Other (See Comments)    myalagia  . Vytorin [Ezetimibe-Simvastatin] Other (See Comments)    myalgia  . Wasp Venom Hives    Family History  Problem Relation Age of Onset  . Stroke Mother   . Hypertension Mother   . Hyperlipidemia Father   . Hyperlipidemia Son        not known, pt assumes because of poor diet    Social History:   reports that she quit smoking about 15 years ago. She has never used smokeless tobacco. She reports that she does not drink alcohol and does not use drugs.  ROS: A complete review of systems was performed.  All systems are negative except for pertinent findings as noted.  Physical Exam:  Vital signs in last 24 hours: Temp:  [98 F (36.7 C)-98.6 F (37 C)] 98.1 F (36.7 C) (08/14 1105) Pulse Rate:  [75-98] 75 (08/14 1105) Resp:  [14-18] 18 (08/14 1105) BP: (110-145)/(64-81) 139/81 (08/14 1105) SpO2:  [91 %-98 %] 97 % (08/14 1105) Constitutional:  Alert and oriented, No acute distress Cardiovascular: Regular rate and rhythm, No JVD Respiratory: Normal respiratory effort, Lungs clear bilaterally GI: Abdomen is soft, nontender, nondistended, no abdominal masses GU: No CVA tenderness Lymphatic: No lymphadenopathy Neurologic: Grossly intact, no focal deficits Psychiatric: Normal mood and affect  Laboratory Data:  Recent Labs    04/13/20 1330 04/13/20 1829 04/14/20 0535  WBC 8.8 7.7 5.6  HGB 13.4 12.7 11.9*  HCT 39.3 38.3 35.6*  PLT 347 325 302    Recent Labs    04/13/20 1330 04/13/20 1330 04/13/20 1829 04/14/20 0535 04/14/20 1215 04/14/20 1802 04/15/20 0530  NA 123*  --   --  127* 128* 127* 123*  K 4.1  --   --  4.3 4.3 4.2 3.9  CL 93*  --   --  96* 98 97* 97*  GLUCOSE 102*  --   --  87 96 89 86  BUN 19  --   --  16 15 14 12   CALCIUM 8.8*  --   --  8.4* 8.3* 8.3* 8.0*  CREATININE 1.51*   < > 1.50* 1.45* 1.46* 1.42* 1.36*   < > = values in this interval not displayed.     Results for orders placed or performed during the hospital encounter of 04/13/20 (from the past 24 hour(s))  Basic metabolic panel     Status: Abnormal   Collection Time: 04/14/20 12:15 PM  Result Value Ref Range   Sodium 128 (L) 135 - 145 mmol/L   Potassium 4.3 3.5 - 5.1 mmol/L   Chloride 98 98 - 111 mmol/L   CO2 20 (L) 22 - 32 mmol/L   Glucose, Bld 96 70 - 99 mg/dL   BUN 15 8 - 23 mg/dL    Creatinine, Ser 1.46 (H) 0.44 - 1.00 mg/dL   Calcium 8.3 (L) 8.9 - 10.3 mg/dL   GFR calc non Af Amer 37 (L) >60 mL/min   GFR calc Af Amer 43 (L) >60 mL/min   Anion gap 10 5 - 15  Basic metabolic panel     Status: Abnormal   Collection Time: 04/14/20  6:02 PM  Result Value Ref Range   Sodium 127 (L) 135 - 145 mmol/L   Potassium 4.2 3.5 - 5.1 mmol/L  Chloride 97 (L) 98 - 111 mmol/L   CO2 21 (L) 22 - 32 mmol/L   Glucose, Bld 89 70 - 99 mg/dL   BUN 14 8 - 23 mg/dL   Creatinine, Ser 1.42 (H) 0.44 - 1.00 mg/dL   Calcium 8.3 (L) 8.9 - 10.3 mg/dL   GFR calc non Af Amer 38 (L) >60 mL/min   GFR calc Af Amer 44 (L) >60 mL/min   Anion gap 9 5 - 15  Basic metabolic panel     Status: Abnormal   Collection Time: 04/15/20  5:30 AM  Result Value Ref Range   Sodium 123 (L) 135 - 145 mmol/L   Potassium 3.9 3.5 - 5.1 mmol/L   Chloride 97 (L) 98 - 111 mmol/L   CO2 18 (L) 22 - 32 mmol/L   Glucose, Bld 86 70 - 99 mg/dL   BUN 12 8 - 23 mg/dL   Creatinine, Ser 1.36 (H) 0.44 - 1.00 mg/dL   Calcium 8.0 (L) 8.9 - 10.3 mg/dL   GFR calc non Af Amer 40 (L) >60 mL/min   GFR calc Af Amer 47 (L) >60 mL/min   Anion gap 8 5 - 15  Osmolality     Status: Abnormal   Collection Time: 04/15/20  5:30 AM  Result Value Ref Range   Osmolality 268 (L) 275 - 295 mOsm/kg  TSH     Status: None   Collection Time: 04/15/20  5:30 AM  Result Value Ref Range   TSH 0.678 0.350 - 4.500 uIU/mL   Recent Results (from the past 240 hour(s))  Urine culture     Status: Abnormal   Collection Time: 04/13/20  1:20 PM   Specimen: Urine, Random  Result Value Ref Range Status   Specimen Description   Final    URINE, RANDOM Performed at [facility], [place] [address] [place], [place] [address]    Special Requests   Final    NONE Performed at [facility], [place] [address], [place], [place] [address]    Culture (A)  Final    20,000 COLONIES/mL MULTIPLE SPECIES PRESENT, SUGGEST RECOLLECTION    Report Status 04/14/2020 FINAL  Final  SARS Coronavirus 2 by RT PCR (hospital order, performed in [facility] hospital lab) Nasopharyngeal Nasopharyngeal Swab     Status: None   Collection Time: 04/13/20  2:13 PM   Specimen: Nasopharyngeal Swab  Result Value Ref Range Status   SARS Coronavirus 2 NEGATIVE NEGATIVE Final    Comment: (NOTE) SARS-CoV-2 target nucleic acids are NOT DETECTED.  The SARS-CoV-2 RNA is generally detectable in upper and lower respiratory specimens during the acute phase of infection. The lowest concentration of SARS-CoV-2 viral copies this assay can detect is 250 copies / mL. A negative result does not preclude SARS-CoV-2 infection and should not be used as the sole basis for treatment or other patient management decisions.  A negative result may occur with improper specimen collection / handling, submission of specimen other than nasopharyngeal swab, presence of viral mutation(s) within the areas targeted by this assay, and inadequate number of viral copies (<250 copies / mL). A negative result must be combined with clinical observations, patient history, and epidemiological information.  Fact Sheet for Patients:   StrictlyIdeas.no  Fact Sheet for Healthcare Providers: BankingDealers.co.za  This test is not yet approved or  cleared by the [place] FDA and has been authorized for detection and/or diagnosis of SARS-CoV-2 by FDA under an Emergency Use Authorization (EUA).  This EUA will remain in effect (meaning this test can be used) for the duration of the COVID-19 declaration under Section 564(b)(1) of the Act, 21 U.S.C. section 360bbb-3(b)(1), unless the authorization is terminated or revoked sooner.  Performed at [facility], [place] [address], [place], [place] [address]   Culture, blood (Routine X 2) w Reflex to ID Panel     Status: None (Preliminary result)   Collection Time:  04/13/20  6:28 PM   Specimen: BLOOD  Result Value Ref Range Status   Specimen Description   Final    BLOOD RIGHT ANTECUBITAL Performed at [facility], [place] [address] [place], [place] [address]    Special Requests   Final    BOTTLES DRAWN AEROBIC AND ANAEROBIC Blood Culture adequate volume Performed at [place] [address] [place], [place] [address]    Culture   Final    NO GROWTH < 12 HOURS Performed at [place] [address], [place], [place] [address]    Report Status PENDING  Incomplete  Culture, blood (Routine X 2) w Reflex to ID Panel     Status: None (Preliminary result)   Collection Time: 04/13/20  6:40 PM   Specimen: BLOOD RIGHT HAND  Result Value Ref Range Status   Specimen Description   Final    BLOOD RIGHT HAND Performed at [place] [address] [place], [place] [address]    Special Requests   Final    BOTTLES DRAWN AEROBIC AND ANAEROBIC Blood Culture adequate volume Performed at [place] [address] [place], [place] [address]    Culture   Final    NO GROWTH < 12 HOURS Performed at [place] [address] [place],  [address]    Report Status PENDING  Incomplete    Renal Function: Recent Labs    04/13/20 1330 04/13/20 1829 04/14/20 0535 04/14/20 1215 04/14/20 1802 04/15/20 0530  CREATININE 1.51* 1.50* 1.45* 1.46* 1.42* 1.36*   Estimated Creatinine Clearance: 46.3 mL/min (A) (by C-G formula based on SCr of 1.36 mg/dL (H)).  Radiologic Imaging: DG Chest 2 View  Result Date: 04/13/2020 CLINICAL DATA:  Weakness diarrhea EXAM: CHEST - 2 VIEW COMPARISON:  07/28/2004 FINDINGS: Heart size upper normal.  Vascularity normal.  Coronary stent noted. Small bilateral pleural effusions. Mild bibasilar atelectasis/infiltrate. IMPRESSION: Interval development of mild bibasilar airspace disease and small pleural effusions. Negative for heart failure.  Electronically Signed   By: Franchot Gallo M.D.   On: 04/13/2020 13:57   CT RENAL STONE STUDY  Result Date: 04/14/2020 CLINICAL DATA:  64 year old female with hematuria. EXAM: CT ABDOMEN AND PELVIS WITHOUT CONTRAST TECHNIQUE: Multidetector CT imaging of the abdomen and pelvis was performed following the standard protocol without IV contrast. COMPARISON:  None. FINDINGS: Evaluation of this exam is limited in the absence of intravenous contrast. Lower chest: Partially visualized moderate right and small left pleural effusions. There is associated partial compressive atelectasis of the lower lobes. Pneumonia is not excluded. Clinical correlation is recommended. There is coronary vascular calcification. Partially visualized trace pericardial effusion. There is no intra-abdominal free air. Small ascites. Hepatobiliary: No focal liver abnormality is seen. No gallstones, gallbladder wall thickening, or biliary dilatation. Pancreas: Unremarkable. No pancreatic ductal dilatation or surrounding inflammatory changes. Spleen: Normal in size without focal abnormality. Adrenals/Urinary Tract: The adrenal glands unremarkable. The left kidney is unremarkable. There is a moderate right hydronephrosis with mild right hydroureter. No  stone identified. There is a transition point in the distal right ureter or right UVJ. The urinary bladder is minimally distended. There is possible mild nodular thickening of the posterior bladder wall adjacent to the right UVJ (77/2). There is loss of fat plane between the anterior lower uterus/cervix and posterior bladder wall. The lower uterus/cervical region is somewhat enlarged. Findings concerning for a neoplastic process either arising from the bladder urothelium or from the cervix and invading into the posterior bladder wall with obstruction of the right UVJ or distal right ureter. Stomach/Bowel: There is a small hiatal hernia. There is no bowel obstruction. There is sigmoid diverticulosis.  The appendix is not visualized with certainty. No inflammatory changes identified in the right lower quadrant. Vascular/Lymphatic: Moderate aortoiliac atherosclerotic disease. The IVC is unremarkable. No portal venous gas. There is no adenopathy. Reproductive: The uterus is anteverted. Enlargement of the lower uterus/cervical region as described above. Other: There is diffuse omental nodularity and caking consistent with metastatic disease. Musculoskeletal: Osteopenia. No acute osseous pathology. IMPRESSION: 1. Findings concerning for a neoplastic process either arising from the bladder urothelium or from the cervix and invading into the posterior bladder wall with obstruction of the right UVJ or distal right ureter. There is associated moderate right hydronephrosis. Sampling of the ascitic fluid may provide further diagnostic information. 2. Diffuse omental nodularity and caking consistent with metastatic disease. 3. Moderate right and small left pleural effusions with associated partial compressive atelectasis of the lower lobes. Pneumonia is not excluded. Clinical correlation is recommended. 4. Sigmoid diverticulosis. No bowel obstruction. 5. Aortic Atherosclerosis (ICD10-I70.0). Electronically Signed   By: Anner Crete M.D.   On: 04/14/2020 17:44    I independently reviewed the above imaging studies.  Impression/Recommendation: Right hydroureteronephrosis with pelvic mass and acute kidney injury: We discussed her CT findings.  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  We discussed proceeding with cystoscopy to help definitively rule out bladder cancer and to perform right ureteral stent placement to help maintain and optimize her renal function considering she is likely to require systemic therapy in the future.  She is agreeable to proceed with the planned procedure.  The  potential risks, complications, and expected recovery process have been discussed.  She gives informed consent.  This will be performed later today.  [name] 04/15/2020, 12:13 PM  Pryor Curia. MD   CC: Dr. Tana Coast

## 2020-04-16 ENCOUNTER — Encounter (HOSPITAL_COMMUNITY): Payer: Self-pay | Admitting: Urology

## 2020-04-16 LAB — BASIC METABOLIC PANEL
Anion gap: 12 (ref 5–15)
BUN: 11 mg/dL (ref 8–23)
CO2: 18 mmol/L — ABNORMAL LOW (ref 22–32)
Calcium: 8.6 mg/dL — ABNORMAL LOW (ref 8.9–10.3)
Chloride: 99 mmol/L (ref 98–111)
Creatinine, Ser: 1.4 mg/dL — ABNORMAL HIGH (ref 0.44–1.00)
GFR calc Af Amer: 45 mL/min — ABNORMAL LOW (ref >60)
GFR calc non Af Amer: 39 mL/min — ABNORMAL LOW (ref >60)
Glucose, Bld: 105 mg/dL — ABNORMAL HIGH (ref 70–99)
Potassium: 4.4 mmol/L (ref 3.5–5.1)
Sodium: 129 mmol/L — ABNORMAL LOW (ref 135–145)

## 2020-04-16 LAB — CBC
HCT: 37.4 % (ref 36.0–46.0)
Hemoglobin: 12.2 g/dL (ref 12.0–15.0)
MCH: 27.4 pg (ref 26.0–34.0)
MCHC: 32.6 g/dL (ref 30.0–36.0)
MCV: 84 fL (ref 80.0–100.0)
Platelets: 354 10*3/uL (ref 150–400)
RBC: 4.45 MIL/uL (ref 3.87–5.11)
RDW: 13.4 % (ref 11.5–15.5)
WBC: 7.6 10*3/uL (ref 4.0–10.5)
nRBC: 0 % (ref 0.0–0.2)

## 2020-04-16 LAB — CEA: CEA: 0.6 ng/mL (ref 0.0–4.7)

## 2020-04-16 LAB — CANCER ANTIGEN 19-9: CA 19-9: 8 U/mL (ref 0–35)

## 2020-04-16 LAB — CA 125: Cancer Antigen (CA) 125: 208 U/mL — ABNORMAL HIGH (ref 0.0–38.1)

## 2020-04-16 LAB — PROTIME-INR
INR: 1.1 (ref 0.8–1.2)
Prothrombin Time: 14.2 seconds (ref 11.4–15.2)

## 2020-04-16 MED ORDER — SODIUM CHLORIDE 0.9 % IV SOLN: INTRAVENOUS | Status: DC

## 2020-04-16 MED ORDER — PROCHLORPERAZINE EDISYLATE 10 MG/2ML IJ SOLN
10.0000 mg | Freq: Four times a day (QID) | INTRAMUSCULAR | Status: DC | PRN
  Administered 2020-04-16 – 2020-05-03 (×5): 10 mg via INTRAVENOUS
  Filled 2020-04-16 (×5): qty 2

## 2020-04-16 MED ORDER — SIMETHICONE 80 MG PO CHEW
80.0000 mg | CHEWABLE_TABLET | Freq: Once | ORAL | Status: AC
  Administered 2020-04-16: 80 mg via ORAL
  Filled 2020-04-16: qty 1

## 2020-04-16 NOTE — Progress Notes (Signed)
Patient ID: Eileen Burgess, female   DOB: March 12, 1956, 64 y.o.   MRN: [id number]  1 Day Post-Op Subjective: Pt s/p right ureteral stent placement.  Biopsy of posterior bladder also performed with intraoperative findings suggest extrinsic mass invading bladder rather than primary bladder malignancy.  Further discussion with patient reveals that she did have some abnormal vaginal bleeding this past spring.  Objective: Vital signs in last 24 hours: Temp:  [97.7 F (36.5 C)-98.2 F (36.8 C)] 97.7 F (36.5 C) (08/15 0535) Pulse Rate:  [74-94] 94 (08/15 0535) Resp:  [16-21] 16 (08/15 0535) BP: (118-158)/(70-94) 158/94 (08/15 0535) SpO2:  [93 %-100 %] 93 % (08/15 0535)  Intake/Output from previous day: 08/14 0701 - 08/15 0700 In: 760 [P.O.:360; I.V.:300; IV Piggyback:100] Out: 505 [Urine:500; Blood:5] Intake/Output this shift: No intake/output data recorded.  Physical Exam:  General: Alert and oriented Abdomen: Soft, ND, No CVAT   Lab Results: Recent Labs    04/13/20 1829 04/14/20 0535 04/16/20 0554  HGB 12.7 11.9* 12.2  HCT 38.3 35.6* 37.4   BMET Recent Labs    04/15/20 1212 04/16/20 0554  NA 130* 129*  K 4.2 4.4  CL 101 99  CO2 19* 18*  GLUCOSE 96 105*  BUN 12 11  CREATININE 1.25* 1.40*  CALCIUM 8.0* 8.6*     Studies/Results: CT CHEST W CONTRAST  Result Date: 04/15/2020 CLINICAL DATA:  Cancer of unknown primary, staging. EXAM: CT CHEST WITH CONTRAST TECHNIQUE: Multidetector CT imaging of the chest was performed during intravenous contrast administration. CONTRAST:  56mL OMNIPAQUE IOHEXOL 300 MG/ML  SOLN COMPARISON:  None. FINDINGS: Cardiovascular: There is mild calcification of the aortic arch. Normal heart size. No pericardial effusion. A coronary artery stent is seen. Mediastinum/Nodes: No enlarged mediastinal, hilar, or axillary lymph nodes. The thyroid gland and trachea demonstrate no significant findings. There is a small hiatal hernia. Lungs/Pleura: Mild  bilateral lower lobe atelectasis is seen, right slightly greater than left. Very mild slightly nodular appearing scarring and/or atelectasis is seen along the posterior aspect of the inferior left upper lobe. There is a small left pleural effusion. A moderate sized right pleural effusion is also noted. No pneumothorax is identified. Upper Abdomen: A moderate amount of abdominal free fluid is seen. There is moderate to marked severity right-sided hydronephrosis with delayed renal cortical enhancement involving the right kidney. Musculoskeletal: No chest wall abnormality. No acute or significant osseous findings. Degenerative changes seen throughout the thoracic spine. IMPRESSION: 1. Small left pleural effusion with a moderate sized right pleural effusion. 2. Mild bilateral lower lobe atelectasis, right slightly greater than left. 3. Very mild slightly nodular appearing scarring and/or atelectasis along the posterior aspect of the inferior left upper lobe. 4. Moderate amount of abdominal free fluid. 5. Moderate to marked severity right-sided hydronephrosis with delayed renal cortical enhancement involving the right kidney. This is suggestive of partial renal obstruction. 6. Small hiatal hernia. 7. Aortic atherosclerosis. Aortic Atherosclerosis (ICD10-I70.0). Electronically Signed   By: Virgina Norfolk M.D.   On: 04/15/2020 15:38   DG C-Arm 1-60 Min-No Report  Result Date: 04/15/2020 Fluoroscopy was utilized by the requesting physician.  No radiographic interpretation.   CT RENAL STONE STUDY  Result Date: 04/14/2020 CLINICAL DATA:  64 year old female with hematuria. EXAM: CT ABDOMEN AND PELVIS WITHOUT CONTRAST TECHNIQUE: Multidetector CT imaging of the abdomen and pelvis was performed following the standard protocol without IV contrast. COMPARISON:  None. FINDINGS: Evaluation of this exam is limited in the absence of intravenous contrast. Lower chest: Partially  visualized moderate right and small left pleural  effusions. There is associated partial compressive atelectasis of the lower lobes. Pneumonia is not excluded. Clinical correlation is recommended. There is coronary vascular calcification. Partially visualized trace pericardial effusion. There is no intra-abdominal free air. Small ascites. Hepatobiliary: No focal liver abnormality is seen. No gallstones, gallbladder wall thickening, or biliary dilatation. Pancreas: Unremarkable. No pancreatic ductal dilatation or surrounding inflammatory changes. Spleen: Normal in size without focal abnormality. Adrenals/Urinary Tract: The adrenal glands unremarkable. The left kidney is unremarkable. There is a moderate right hydronephrosis with mild right hydroureter. No stone identified. There is a transition point in the distal right ureter or right UVJ. The urinary bladder is minimally distended. There is possible mild nodular thickening of the posterior bladder wall adjacent to the right UVJ (77/2). There is loss of fat plane between the anterior lower uterus/cervix and posterior bladder wall. The lower uterus/cervical region is somewhat enlarged. Findings concerning for a neoplastic process either arising from the bladder urothelium or from the cervix and invading into the posterior bladder wall with obstruction of the right UVJ or distal right ureter. Stomach/Bowel: There is a small hiatal hernia. There is no bowel obstruction. There is sigmoid diverticulosis. The appendix is not visualized with certainty. No inflammatory changes identified in the right lower quadrant. Vascular/Lymphatic: Moderate aortoiliac atherosclerotic disease. The IVC is unremarkable. No portal venous gas. There is no adenopathy. Reproductive: The uterus is anteverted. Enlargement of the lower uterus/cervical region as described above. Other: There is diffuse omental nodularity and caking consistent with metastatic disease. Musculoskeletal: Osteopenia. No acute osseous pathology. IMPRESSION: 1. Findings  concerning for a neoplastic process either arising from the bladder urothelium or from the cervix and invading into the posterior bladder wall with obstruction of the right UVJ or distal right ureter. There is associated moderate right hydronephrosis. Sampling of the ascitic fluid may provide further diagnostic information. 2. Diffuse omental nodularity and caking consistent with metastatic disease. 3. Moderate right and small left pleural effusions with associated partial compressive atelectasis of the lower lobes. Pneumonia is not excluded. Clinical correlation is recommended. 4. Sigmoid diverticulosis. No bowel obstruction. 5. Aortic Atherosclerosis (ICD10-I70.0). Electronically Signed   By: Anner Crete M.D.   On: 04/14/2020 17:44    Assessment/Plan: 1) Right ureteral obstruction: S/P right ureteral stent.  Renal function stable.  Will arrange outpatient follow up for management. 2) Pelvic mass:  I was able to obtain biopsies of posterior bladder.  This did not appear consistent with bladder primary but rather likely an extrinsic mass extending toward bladder. My suspicious is that this is likely a GYN primary.  May be able to hold off on IR biopsy of omentum if biopsies from yesterday are diagnostic. Path currently  Pending.    LOS: 3 days   [name] 04/16/2020, 7:21 AM

## 2020-04-16 NOTE — Evaluation (Signed)
Physical Therapy Evaluation Patient Details Name: Eileen Burgess MRN: [id number] DOB: 1956-04-04 Today's Date: 04/16/2020   History of Present Illness  64 yo female admitted with UTI. Imaging (+) pelvic mass. S/P R ureteral stent placement, biopsy 8/14. Hx of CAD,MI, obesity  Clinical Impression  On eval, pt was Min guard assist for mobility. She walked ~20 feet around the room. She mentioned she had experienced some N/V earlier this morning- none during session. Assisted back to bed to rest. Will plan to follow and progress activity as tolerated. Will update recommendations as necessary.     Follow Up Recommendations Supervision for mobility/OOB    Equipment Recommendations  None recommended by PT    Recommendations for Other Services       Precautions / Restrictions Restrictions Weight Bearing Restrictions: No      Mobility  Bed Mobility Overal bed mobility: Modified Independent                Transfers Overall transfer level: Needs assistance Equipment used: None Transfers: Sit to/from Stand Sit to Stand: Supervision            Ambulation/Gait Ambulation/Gait assistance: Min guard Gait Distance (Feet): 20 Feet Assistive device: None Gait Pattern/deviations: Step-through pattern;Decreased stride length     General Gait Details: slow gait speed. intermittent use of room furniture for support.  Stairs            Wheelchair Mobility    Modified Rankin (Stroke Patients Only)       Balance Overall balance assessment: Needs assistance           Standing balance-Leahy Scale: Fair                               Pertinent Vitals/Pain Pain Assessment: No/denies pain    Home Living Family/patient expects to be discharged to:: Private residence Living Arrangements: Children Available Help at Discharge: Family Type of Home: House Home Access: Stairs to enter   Technical brewer of Steps: 2 Home Layout: One level Home  Equipment: None      Prior Function Level of Independence: Independent               Hand Dominance        Extremity/Trunk Assessment   Upper Extremity Assessment Upper Extremity Assessment: Overall WFL for tasks assessed    Lower Extremity Assessment Lower Extremity Assessment: Generalized weakness    Cervical / Trunk Assessment Cervical / Trunk Assessment: Normal  Communication   Communication: No difficulties  Cognition Arousal/Alertness: Awake/alert Behavior During Therapy: WFL for tasks assessed/performed Overall Cognitive Status: Within Functional Limits for tasks assessed                                        General Comments      Exercises     Assessment/Plan    PT Assessment Patient needs continued PT services  PT Problem List Decreased strength;Decreased mobility;Decreased activity tolerance;Decreased balance       PT Treatment Interventions Gait training;Therapeutic activities;Therapeutic exercise;Patient/family education;Balance training;Functional mobility training    PT Goals (Current goals can be found in the Care Plan section)  Acute Rehab PT Goals Patient Stated Goal: "good news" PT Goal Formulation: With patient Time For Goal Achievement: 04/30/20 Potential to Achieve Goals: Good    Frequency Min 3X/week   Barriers to discharge  Co-evaluation               AM-PAC PT "6 Clicks" Mobility  Outcome Measure Help needed turning from your back to your side while in a flat bed without using bedrails?: None Help needed moving from lying on your back to sitting on the side of a flat bed without using bedrails?: None Help needed moving to and from a bed to a chair (including a wheelchair)?: A Little Help needed standing up from a chair using your arms (e.g., wheelchair or bedside chair)?: A Little Help needed to walk in hospital room?: A Little Help needed climbing 3-5 steps with a railing? : A Little 6  Click Score: 20    End of Session   Activity Tolerance: Patient tolerated treatment well Patient left: in bed;with call bell/phone within reach   PT Visit Diagnosis: Muscle weakness (generalized) (M62.81);Unsteadiness on feet (R26.81)    Time: 3500-9381 PT Time Calculation (min) (ACUTE ONLY): 12 min   Charges:   PT Evaluation $PT Eval Low Complexity: [place], PT Acute Rehabilitation  Office: [phone] Pager: [phone]

## 2020-04-16 NOTE — Progress Notes (Addendum)
[facility]                                                                              Patient Demographics  Eileen Burgess, is a 64 y.o. female, DOB - April 12, 1956, EXB:[id number]  Admit date - 04/13/2020   Admitting Physician Guilford Shi, MD  Outpatient Primary MD for the patient is Patient, No Pcp Per  Outpatient specialists:   LOS - 3  days   Medical records reviewed and are as summarized below:    Chief complaint: 3 weeks of episodic diarrhea, nausea, decreased oral intake, dysuria.      Brief summary   64 y.o.femalepast medical history of CAD status post angioplasty to the LAD, essential hypertension history of tobacco abuse she is currently non-smoking, GERD that comes into the hospital for 3 weeks of episodic diarrhea that has progressively gotten worse, she has not received any antibiotics over the last year, she is also been nauseated with loss of appetite and decreased oral intake despite this she has continued to take her antihypertensive medication, she does relate that on the day of admission she started having some dysuria,she denies any urgency frequency hematuria or hematochezia. She relates some dizziness upon standing she relates she received a Covid vaccine more than 8 months ago. ED Course: Afebrile,Vital signs are stable- saturations are greater than 97%, she was found to be hyponatremic, new acute renal failure with elevated AST and ALT unremarkable, UA  positive for bacteria and hemoglobin large, WBC/RBC>  50    Assessment & Plan    Abdominal pain with dysuria, AKI, right hydronephrosis,  Metastatic omental carcinomatosis of unknown primary -CT abdomen pelvis showed possible neoplastic processes arising from the bladder urothelium or from cervix and invading into the posterior bladder wall with obstruction of right UVJ or distal right ureter, moderate right hydronephrosis, diffuse omental nodularity and caking consistent with  metastatic disease. Moderate right and small left pleural effusions, associated partial compressive atelectasis of lower lobes.  Discussed with the patient, no prior history of malignancy (just had a history of ovarian cyst at the age of 54 years). -Urology consulted, status post right ureteral stent placement.  Biopsy of the posterior bladder with intraoperative findings suggest extrinsic mass invading bladder rather than primary bladder malignancy.   -IR consulted for biopsy of the omental caking ?  Metastatic.  Will await urology biopsy results. - CA 19-9, CEA, CA125 pending -CT chest with contrast showed small pleural effusion left, moderate sized right pleural effusion, moderate amount of abdominal free fluid, moderate to marked severe right-sided hydronephrosis.  No lung lesions. - will consult Gyn-Onc.   Hyponatremia, dehydration -Appears to be hypovolemic related to recent diarrhea and poor p.o. intake, HCTZ use vs SIADH from ?malignancy.  Serum osmolality 268, urine osmolality 617, urine sodium 50 -Hold diuretics, continue IV fluid hydration for now -Trending up, 123 --> 129 today.  Now complaining of nausea and vomiting since morning, obtain KUB, placed on clear liquid diet, antiemetics, continue gentle hydration.   Acute kidney injury -In the setting of #1, dehydration, HCTZ use -Continue to hold lisinopril, HCTZ, gentle hydration. -Status post ureteral stent -  Cr up 1.4, continue IV fluids.  Hypertension -Currently stable, continue metoprolol, avoid diuretics, ACE inhibitor  CAD status post PTCA Continue metoprolol, aspirin  GERD Continue PPI  Obesity Estimated body mass index is 37.09 kg/m as calculated from the following:   Height as of this encounter: 5\' 4"  (1.626 m).   Weight as of this encounter: 98 kg.  Code Status: Full code DVT Prophylaxis: Change to heparin subcu Family Communication: Discussed all imaging results, lab results, explained to the patient  and son, Roderic Palau on the phone on 8/14   Disposition Plan:     Status is: Inpatient  Remains inpatient appropriate because:Inpatient level of care appropriate due to severity of illness   Dispo: The patient is from: Home              Anticipated d/c is to: Home              Anticipated d/c date is: > 3 days              Patient currently is not medically stable to d/c.      Time Spent in minutes 45 minutes  Procedures:  CT renal stone study 04/15/20 : Right ureteral stent placement and posterior bladder biopsy  Consultants:   Urology IR Oncology  Antimicrobials:   Anti-infectives (From admission, onward)   Start     Dose/Rate Route Frequency Ordered Stop   04/15/20 1547  sodium chloride 0.9 % with cefTRIAXone (ROCEPHIN) ADS Med       Note to Pharmacy: Nunzio Cobbs : cabinet override      04/15/20 1547 04/16/20 0359   04/13/20 1700  cefTRIAXone (ROCEPHIN) 1 g in sodium chloride 0.9 % 100 mL IVPB     Discontinue     1 g 200 mL/hr over 30 Minutes Intravenous Every 24 hours 04/13/20 1635 04/17/20 1659         Medications  Scheduled Meds: . heparin injection (subcutaneous)  5,000 Units Subcutaneous Q8H  . metoprolol succinate  25 mg Oral Daily  . pantoprazole  40 mg Oral Daily   Continuous Infusions: . sodium chloride 60 mL/hr at 04/16/20 1042  . cefTRIAXone (ROCEPHIN)  IV 200 mL/hr at 04/14/20 1725   PRN Meds:.acetaminophen **OR** acetaminophen, hydrALAZINE, nitroGLYCERIN, ondansetron **OR** ondansetron (ZOFRAN) IV, phenol, prochlorperazine      Subjective:   Lelania Bia was seen and examined today.  Complaining of nausea and vomiting since early morning.  Poor appetite.  States yesterday evening was fine and started this morning, epigastric abdominal pain, intermittent, worse on movement.  No chest pain or shortness of breath, diarrhea.  Objective:   Vitals:   04/15/20 1700 04/15/20 1726 04/15/20 2020 04/16/20 0535  BP: 130/76 (!) 146/81 (!)  151/88 (!) 158/94  Pulse: 79 75 78 94  Resp: 17 16 16 16   Temp: 97.7 F (36.5 C) 97.7 F (36.5 C) 98 F (36.7 C) 97.7 F (36.5 C)  TempSrc:  Oral Oral Oral  SpO2: 98% 97% 96% 93%  Weight:      Height:        Intake/Output Summary (Last 24 hours) at 04/16/2020 1152 Last data filed at 04/16/2020 0900 Gross per 24 hour  Intake 760 ml  Output 505 ml  Net 255 ml     Wt Readings from Last 3 Encounters:  04/13/20 98 kg  06/24/19 98 kg  06/16/18 97.9 kg    Physical Exam  General: Alert and oriented x 3, uncomfortable  Cardiovascular: S1 S2 clear,  RRR. No pedal edema b/l  Respiratory: CTAB, no wheezing, rales or rhonchi  Gastrointestinal: Soft, nontender, nondistended, NBS  Ext: no pedal edema bilaterally  Neuro: no new deficits  Musculoskeletal: No cyanosis, clubbing  Skin: No rashes  Psych: bit anxious, uncomfortable    Data Reviewed:  I have personally reviewed following labs and imaging studies  Micro Results Recent Results (from the past 240 hour(s))  Urine culture     Status: Abnormal   Collection Time: 04/13/20  1:20 PM   Specimen: Urine, Random  Result Value Ref Range Status   Specimen Description   Final    URINE, RANDOM Performed at [facility] [address], [place], [place] [address]    Special Requests   Final    NONE Performed at [facility], [place] [address] [place], [place] [address]    Culture (A)  Final    20,000 COLONIES/mL MULTIPLE SPECIES PRESENT, SUGGEST RECOLLECTION   Report Status 04/14/2020 FINAL  Final  SARS Coronavirus 2 by RT PCR (hospital order, performed in [facility] hospital lab) Nasopharyngeal Nasopharyngeal Swab     Status: None   Collection Time: 04/13/20  2:13 PM   Specimen: Nasopharyngeal Swab  Result Value Ref Range Status   SARS Coronavirus 2 NEGATIVE NEGATIVE Final    Comment: (NOTE) SARS-CoV-2 target nucleic acids are NOT DETECTED.  The SARS-CoV-2 RNA is generally  detectable in upper and lower respiratory specimens during the acute phase of infection. The lowest concentration of SARS-CoV-2 viral copies this assay can detect is 250 copies / mL. A negative result does not preclude SARS-CoV-2 infection and should not be used as the sole basis for treatment or other patient management decisions.  A negative result may occur with improper specimen collection / handling, submission of specimen other than nasopharyngeal swab, presence of viral mutation(s) within the areas targeted by this assay, and inadequate number of viral copies (<250 copies / mL). A negative result must be combined with clinical observations, patient history, and epidemiological information.  Fact Sheet for Patients:   StrictlyIdeas.no  Fact Sheet for Healthcare Providers: BankingDealers.co.za  This test is not yet approved or  cleared by the [place] FDA and has been authorized for detection and/or diagnosis of SARS-CoV-2 by FDA under an Emergency Use Authorization (EUA).  This EUA will remain in effect (meaning this test can be used) for the duration of the COVID-19 declaration under Section 564(b)(1) of the Act, 21 U.S.C. section 360bbb-3(b)(1), unless the authorization is terminated or revoked sooner.  Performed at [facility], [place] [address] [place], [place] [address]   Culture, blood (Routine X 2) w Reflex to ID Panel     Status: None (Preliminary result)   Collection Time: 04/13/20  6:28 PM   Specimen: BLOOD  Result Value Ref Range Status   Specimen Description   Final    BLOOD RIGHT ANTECUBITAL Performed at [facility], [place] [address] [place], [place] [address]    Special Requests   Final    BOTTLES DRAWN AEROBIC AND ANAEROBIC Blood Culture adequate volume Performed at [facility] [address] [place], [place] [address]    Culture   Final    NO GROWTH 3  DAYS Performed at [facility], [place] [address] [place], [place] [address]    Report Status PENDING  Incomplete  Culture, blood (Routine X 2) w Reflex to ID Panel     Status: None (Preliminary  result)   Collection Time: 04/13/20  6:40 PM   Specimen: BLOOD RIGHT HAND  Result Value Ref Range Status   Specimen Description   Final    BLOOD RIGHT HAND Performed at [facility] [address] [place], [place] [address]    Special Requests   Final    BOTTLES DRAWN AEROBIC AND ANAEROBIC Blood Culture adequate volume Performed at [facility] [address] [place], [place] [address]    Culture   Final    NO GROWTH 3 DAYS Performed at [facility], [place] [address], [place], [place] [address]    Report Status PENDING  Incomplete  MRSA PCR Screening     Status: None   Collection Time: 04/15/20  3:04 PM   Specimen: Nasal Mucosa; Nasopharyngeal  Result Value Ref Range Status   MRSA by PCR NEGATIVE NEGATIVE Final    Comment:        The GeneXpert MRSA Assay (FDA approved for NASAL specimens only), is one component of a comprehensive MRSA colonization surveillance program. It is not intended to diagnose MRSA infection nor to guide or monitor treatment for MRSA infections. Performed at [facility], [place] [address], [place], [place] [address]     Radiology Reports DG Chest 2 View  Result Date: 04/13/2020 CLINICAL DATA:  Weakness diarrhea EXAM: CHEST - 2 VIEW COMPARISON:  07/28/2004 FINDINGS: Heart size upper normal.  Vascularity normal.  Coronary stent noted. Small bilateral pleural effusions. Mild bibasilar atelectasis/infiltrate. IMPRESSION: Interval development of mild bibasilar airspace disease and small pleural effusions. Negative for heart failure. Electronically Signed   By: Franchot Gallo M.D.   On: 04/13/2020 13:57   CT CHEST W CONTRAST  Result Date: 04/15/2020 CLINICAL DATA:  Cancer of unknown primary,  staging. EXAM: CT CHEST WITH CONTRAST TECHNIQUE: Multidetector CT imaging of the chest was performed during intravenous contrast administration. CONTRAST:  45mL OMNIPAQUE IOHEXOL 300 MG/ML  SOLN COMPARISON:  None. FINDINGS: Cardiovascular: There is mild calcification of the aortic arch. Normal heart size. No pericardial effusion. A coronary artery stent is seen. Mediastinum/Nodes: No enlarged mediastinal, hilar, or axillary lymph nodes. The thyroid gland and trachea demonstrate no significant findings. There is a small hiatal hernia. Lungs/Pleura: Mild bilateral lower lobe atelectasis is seen, right slightly greater than left. Very mild slightly nodular appearing scarring and/or atelectasis is seen along the posterior aspect of the inferior left upper lobe. There is a small left pleural effusion. A moderate sized right pleural effusion is also noted. No pneumothorax is identified. Upper Abdomen: A moderate amount of abdominal free fluid is seen. There is moderate to marked severity right-sided hydronephrosis with delayed renal cortical enhancement involving the right kidney. Musculoskeletal: No chest wall abnormality. No acute or significant osseous findings. Degenerative changes seen throughout the thoracic spine. IMPRESSION: 1. Small left pleural effusion with a moderate sized right pleural effusion. 2. Mild bilateral lower lobe atelectasis, right slightly greater than left. 3. Very mild slightly nodular appearing scarring and/or atelectasis along the posterior aspect of the inferior left upper lobe. 4. Moderate amount of abdominal free fluid. 5. Moderate to marked severity right-sided hydronephrosis with delayed renal cortical enhancement involving the right kidney. This is suggestive of partial renal obstruction. 6. Small hiatal hernia. 7. Aortic atherosclerosis. Aortic Atherosclerosis (ICD10-I70.0). Electronically Signed   By: Virgina Norfolk M.D.   On: 04/15/2020 15:38   DG C-Arm 1-60 Min-No  Report  Result Date: 04/15/2020 Fluoroscopy was utilized by the requesting physician.  No radiographic interpretation.   CT RENAL STONE STUDY  Result Date: 04/14/2020 CLINICAL DATA:  64 year old female with hematuria. EXAM: CT ABDOMEN AND PELVIS WITHOUT CONTRAST TECHNIQUE: Multidetector CT imaging of the abdomen and pelvis was performed following the standard protocol without IV contrast. COMPARISON:  None. FINDINGS: Evaluation of this exam is limited in the absence of intravenous contrast. Lower chest: Partially visualized moderate right and small left pleural effusions. There is associated partial compressive atelectasis of the lower lobes. Pneumonia is not excluded. Clinical correlation is recommended. There is coronary vascular calcification. Partially visualized trace pericardial effusion. There is no intra-abdominal free air. Small ascites. Hepatobiliary: No focal liver abnormality is seen. No gallstones, gallbladder wall thickening, or biliary dilatation. Pancreas: Unremarkable. No pancreatic ductal dilatation or surrounding inflammatory changes. Spleen: Normal in size without focal abnormality. Adrenals/Urinary Tract: The adrenal glands unremarkable. The left kidney is unremarkable. There is a moderate right hydronephrosis with mild right hydroureter. No stone identified. There is a transition point in the distal right ureter or right UVJ. The urinary bladder is minimally distended. There is possible mild nodular thickening of the posterior bladder wall adjacent to the right UVJ (77/2). There is loss of fat plane between the anterior lower uterus/cervix and posterior bladder wall. The lower uterus/cervical region is somewhat enlarged. Findings concerning for a neoplastic process either arising from the bladder urothelium or from the cervix and invading into the posterior bladder wall with obstruction of the right UVJ or distal right ureter. Stomach/Bowel: There is a small hiatal hernia. There is no  bowel obstruction. There is sigmoid diverticulosis. The appendix is not visualized with certainty. No inflammatory changes identified in the right lower quadrant. Vascular/Lymphatic: Moderate aortoiliac atherosclerotic disease. The IVC is unremarkable. No portal venous gas. There is no adenopathy. Reproductive: The uterus is anteverted. Enlargement of the lower uterus/cervical region as described above. Other: There is diffuse omental nodularity and caking consistent with metastatic disease. Musculoskeletal: Osteopenia. No acute osseous pathology. IMPRESSION: 1. Findings concerning for a neoplastic process either arising from the bladder urothelium or from the cervix and invading into the posterior bladder wall with obstruction of the right UVJ or distal right ureter. There is associated moderate right hydronephrosis. Sampling of the ascitic fluid may provide further diagnostic information. 2. Diffuse omental nodularity and caking consistent with metastatic disease. 3. Moderate right and small left pleural effusions with associated partial compressive atelectasis of the lower lobes. Pneumonia is not excluded. Clinical correlation is recommended. 4. Sigmoid diverticulosis. No bowel obstruction. 5. Aortic Atherosclerosis (ICD10-I70.0). Electronically Signed   By: Anner Crete M.D.   On: 04/14/2020 17:44    Lab Data:  CBC: Recent Labs  Lab 04/13/20 1330 04/13/20 1829 04/14/20 0535 04/16/20 0554  WBC 8.8 7.7 5.6 7.6  NEUTROABS 7.0  --   --   --   HGB 13.4 12.7 11.9* 12.2  HCT 39.3 38.3 35.6* 37.4  MCV 81.0 83.1 83.2 84.0  PLT 347 325 302 371   Basic Metabolic Panel: Recent Labs  Lab 04/14/20 1215 04/14/20 1802 04/15/20 0530 04/15/20 1212 04/16/20 0554  NA 128* 127* 123* 130* 129*  K 4.3 4.2 3.9 4.2 4.4  CL 98 97* 97* 101 99  CO2 20* 21* 18* 19* 18*  GLUCOSE 96 89 86 96 105*  BUN 15 14 12 12 11   CREATININE 1.46* 1.42* 1.36* 1.25* 1.40*  CALCIUM 8.3* 8.3* 8.0* 8.0* 8.6*    GFR: Estimated Creatinine Clearance: 44.9 mL/min (A) (by C-G formula based on SCr of  1.4 mg/dL (H)). Liver Function Tests: Recent Labs  Lab 04/13/20 1330  AST 14*  ALT 12  ALKPHOS 65  BILITOT 0.7  PROT 7.1  ALBUMIN 3.4*   Recent Labs  Lab 04/13/20 1330  LIPASE 27   No results for input(s): AMMONIA in the last 168 hours. Coagulation Profile: Recent Labs  Lab 04/16/20 0554  INR 1.1   Cardiac Enzymes: No results for input(s): CKTOTAL, CKMB, CKMBINDEX, TROPONINI in the last 168 hours. BNP (last 3 results) No results for input(s): PROBNP in the last 8760 hours. HbA1C: No results for input(s): HGBA1C in the last 72 hours. CBG: No results for input(s): GLUCAP in the last 168 hours. Lipid Profile: No results for input(s): CHOL, HDL, LDLCALC, TRIG, CHOLHDL, LDLDIRECT in the last 72 hours. Thyroid Function Tests: Recent Labs    04/15/20 0530  TSH 0.678   Anemia Panel: No results for input(s): VITAMINB12, FOLATE, FERRITIN, TIBC, IRON, RETICCTPCT in the last 72 hours. Urine analysis:    Component Value Date/Time   COLORURINE YELLOW 04/13/2020 1320   APPEARANCEUR HAZY (A) 04/13/2020 1320   LABSPEC 1.019 04/13/2020 1320   PHURINE 5.0 04/13/2020 1320   GLUCOSEU NEGATIVE 04/13/2020 1320   HGBUR LARGE (A) 04/13/2020 1320   BILIRUBINUR NEGATIVE 04/13/2020 1320   KETONESUR 5 (A) 04/13/2020 1320   PROTEINUR 100 (A) 04/13/2020 1320   NITRITE NEGATIVE 04/13/2020 1320   LEUKOCYTESUR LARGE (A) 04/13/2020 1320     Almeta Geisel M.D. [facility] 04/16/2020, 11:52 AM   Call night coverage person covering after 7pm

## 2020-04-17 ENCOUNTER — Encounter: Payer: Self-pay | Admitting: Hematology and Oncology

## 2020-04-17 ENCOUNTER — Other Ambulatory Visit: Payer: Self-pay

## 2020-04-17 ENCOUNTER — Encounter (HOSPITAL_COMMUNITY): Payer: Self-pay | Admitting: Internal Medicine

## 2020-04-17 DIAGNOSIS — C539 Malignant neoplasm of cervix uteri, unspecified: Secondary | ICD-10-CM | POA: Insufficient documentation

## 2020-04-17 LAB — BASIC METABOLIC PANEL
Anion gap: 14 (ref 5–15)
BUN: 14 mg/dL (ref 8–23)
CO2: 15 mmol/L — ABNORMAL LOW (ref 22–32)
Calcium: 8.6 mg/dL — ABNORMAL LOW (ref 8.9–10.3)
Chloride: 101 mmol/L (ref 98–111)
Creatinine, Ser: 1.4 mg/dL — ABNORMAL HIGH (ref 0.44–1.00)
GFR calc Af Amer: 45 mL/min — ABNORMAL LOW (ref >60)
GFR calc non Af Amer: 39 mL/min — ABNORMAL LOW (ref >60)
Glucose, Bld: 90 mg/dL (ref 70–99)
Potassium: 3.9 mmol/L (ref 3.5–5.1)
Sodium: 130 mmol/L — ABNORMAL LOW (ref 135–145)

## 2020-04-17 LAB — CBC
HCT: 39.5 % (ref 36.0–46.0)
Hemoglobin: 13 g/dL (ref 12.0–15.0)
MCH: 27.7 pg (ref 26.0–34.0)
MCHC: 32.9 g/dL (ref 30.0–36.0)
MCV: 84.2 fL (ref 80.0–100.0)
Platelets: 348 10*3/uL (ref 150–400)
RBC: 4.69 MIL/uL (ref 3.87–5.11)
RDW: 13.8 % (ref 11.5–15.5)
WBC: 8.7 10*3/uL (ref 4.0–10.5)
nRBC: 0 % (ref 0.0–0.2)

## 2020-04-17 MED ORDER — SIMETHICONE 80 MG PO CHEW: 80.0000 mg | CHEWABLE_TABLET | Freq: Four times a day (QID) | ORAL | Status: DC | PRN

## 2020-04-17 MED ORDER — POLYETHYLENE GLYCOL 3350 17 G PO PACK: 17.0000 g | PACK | Freq: Once | ORAL | Status: DC

## 2020-04-17 MED ORDER — SENNOSIDES-DOCUSATE SODIUM 8.6-50 MG PO TABS
1.0000 | ORAL_TABLET | Freq: Every day | ORAL | Status: DC
  Administered 2020-04-17 – 2020-04-20 (×4): 1 via ORAL
  Filled 2020-04-17 (×4): qty 1

## 2020-04-17 MED ORDER — SIMETHICONE 80 MG PO CHEW
80.0000 mg | CHEWABLE_TABLET | Freq: Once | ORAL | Status: AC
  Administered 2020-04-17: 80 mg via ORAL
  Filled 2020-04-17: qty 1

## 2020-04-17 NOTE — Consult Note (Addendum)
Eileen Burgess  [phone] [phone]   [place]  Referral MD: Dr. 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  The patient presented to the hospital with malaise, fatigue, and a 3-week history of episodic diarrhea that has progressively worsened.  She has also been experiencing anorexia and nausea.  On admission, labs significant for sodium of 123, creatinine 1.51, albumin 3.4.  Urinalysis on admission showed a large amount of hemoglobin, large number of leukocytes, and elevated ketones and protein.  Urine culture had insignificant growth.  A CT renal stone study performed on 04/14/2020 showed findings concerning for a neoplastic process either arising from the bladder urothelium or from the cervix and invading into the posterior bladder wall with obstruction of the right UVJ or distal right ureter, moderate right hydronephrosis, diffuse omental nodularity and caking consistent with metastatic disease, moderate right and small left pleural effusions.  CT chest with contrast performed on 04/15/2020 showed a small left pleural effusion with moderate size right pleural effusion and moderate amount of abdominal free fluid.  The patient underwent right ureteral stent placement on 04/15/2020 with biopsy of the posterior bladder with intraoperative findings suggestive of extrinsic mass invading the bladder rather than primary bladder malignancy.  A CA-125 was checked on 04/15/2020 and was elevated at 208.  A CEA and CA 19.9 were also obtained and were normal.  The  patient was seen in consultation by GYN oncology on 04/17/2020 who biopsied her cervix.  Final pathology is pending, but frozen section was sent to pathology who indicates that it looks glandular and consistent with adenocarcinoma.  At this time, she is not a surgical candidate and they recommend systemic therapy.  Today, the patient reports that she vomited after eating some broth.  She states that she has been having nausea for the past few days.  She took Compazine just prior to my visit.  The patient reports that her appetite has been up and down and that she is lost about 10 pounds over the past month.  She reports that her abdomen feels full but is not certain how long this fullness has been present.  She denies headaches, dizziness, vision changes.  Denies chest pain but reports mild dyspnea with exertion.  Denies cough.  She is not currently having any abdominal pain.  Reports that she developed stools that were harder in consistency last week.  Has not moved her bowels since Saturday, 04/15/2020.  Reports that she is passing flatus.  Denies lower extremity edema.  Reports that she has developed some bleeding following the biopsy yesterday.  Reports that she went through menopause in 2000 but developed vaginal bleeding in 2018 which occurred monthly like menses.  States that this vaginal bleeding stopped in the spring 2021.  The patient is widowed and has 1 son.  She lives with her son and daughter-in-law.  Denies history of alcohol use.  Has a 20-pack-year history of tobacco use.  Quit in 2005. Medical oncology was asked see the patient to make recommendations regarding her abnormal imaging metastatic gynecologic cancer.    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 Dominant RCA is a large PDA and posterolateral branch.   CYSTOSCOPY W/ URETERAL STENT PLACEMENT Right 04/15/2020   Procedure: CYSTOSCOPY WITH RETROGRADE PYELOGRAM/URETERAL STENT PLACEMENT;  Surgeon: Raynelle Bring, MD;  Location: [facility];  Service: Urology;  Laterality: Right;   NM MYOVIEW LTD  10/2012   8:50 min; 9 METS.  No ischemia or Infarction; EF ~68%   OVARIAN CYST REMOVAL  12/1968   TRANSURETHRAL RESECTION OF BLADDER TUMOR N/A 04/15/2020   Procedure: bladder biopsy with fulguration of bleeders;  Surgeon: Raynelle Bring, MD;  Location: [facility];  Service: Urology;  Laterality: N/A;   :  Current Facility-Administered Medications  Medication Dose Route Frequency Provider Last Rate Last Admin   0.9 %  sodium chloride infusion   Intravenous Continuous Rai, Ripudeep K, MD 60 mL/hr at 04/17/20 1100 New Bag at 04/17/20 1100   acetaminophen (TYLENOL) tablet 650 mg  650 mg Oral Q6H PRN Raynelle Bring, MD       Or   acetaminophen (TYLENOL) suppository 650 mg  650 mg Rectal Q6H PRN Raynelle Bring, MD       cefTRIAXone (ROCEPHIN) 1 g in sodium chloride 0.9 % 100 mL IVPB  1 g Intravenous Q24H Raynelle Bring, MD   Stopping Infusion hung by another clincian at 04/16/20 1736   hydrALAZINE (APRESOLINE) tablet 25 mg  25 mg Oral Q6H PRN Raynelle Bring, MD       metoprolol succinate (TOPROL-XL) 24 hr tablet 25 mg  25 mg Oral Daily Raynelle Bring, MD   25 mg at 04/17/20 1056   nitroGLYCERIN (NITROSTAT) SL tablet 0.4 mg  0.4 mg Sublingual Q5 Min x 3 PRN Raynelle Bring, MD       ondansetron [facility]) tablet 4 mg  4 mg Oral Q6H PRN Raynelle Bring, MD       Or    ondansetron [facility]) injection 4 mg  4 mg Intravenous Q6H PRN Raynelle Bring, MD   4 mg at 04/17/20 1425   pantoprazole (PROTONIX) EC tablet 40 mg  40 mg Oral Daily Raynelle Bring, MD   40 mg at 04/17/20 1056   phenol (CHLORASEPTIC) mouth spray 1 spray  1 spray Mouth/Throat PRN Rai, Ripudeep K, MD       polyethylene glycol (MIRALAX / GLYCOLAX) packet 17 g  17 g Oral Once Rai, Ripudeep K, MD       prochlorperazine (COMPAZINE) injection 10 mg  10 mg Intravenous Q6H PRN Rai, Ripudeep K, MD   10 mg at 04/16/20 0844   senna-docusate (Senokot-S) tablet 1 tablet  1 tablet Oral QHS Rai, Ripudeep K, MD       simethicone (MYLICON) chewable tablet 80 mg  80 mg Oral QID PRN Rai, Vernelle Emerald, MD         Allergies  Allergen Reactions   Shrimp [  Shellfish Allergy] Anaphylaxis   Crestor [Rosuvastatin] Other (See Comments)    LEGS HURTING   Pravastatin Other (See Comments)    myalagia   Vytorin [Ezetimibe-Simvastatin] Other (See Comments)    myalgia   Wasp Venom Hives   :  Family History  Problem Relation Age of Onset   Stroke Mother    Hypertension Mother    Hyperlipidemia Father    Cancer Maternal Grandmother        unsure type, thinks may be gyn   Hyperlipidemia Son        not known, pt assumes because of poor diet   Breast cancer Neg Hx    Colon cancer Neg Hx    :  Social History   Socioeconomic History   Marital status: Widowed    Spouse name: Not on file   Number of children: Not on file   Years of education: Not on file   Highest education level: Not on file  Occupational History   Occupation: retired  Tobacco Use   Smoking status: Former Smoker    Quit date: 07/03/2004    Years since quitting: 15.8   Smokeless tobacco: Never Used  Substance and Sexual Activity   Alcohol use: No   Drug use: No   Sexual activity: Not Currently  Other Topics Concern   Not on file  Social History Narrative   Widowed.   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   Has been quite active, but no routine exercise.   Quit Smoking in 2005 before her MI.  Does not drink alcohol.   Social Determinants of Health   Financial Resource Strain:    Difficulty of Paying Living Expenses:   Food Insecurity:    Worried About Charity fundraiser in the Last Year:    Arboriculturist in the Last Year:   Transportation Needs:    Film/video editor (Medical):    Lack of Transportation (Non-Medical):   Physical Activity:    Days of Exercise per Week:    Minutes of Exercise per Session:   Stress:    Feeling of Stress :   Social Connections:    Frequency of Communication with Friends and Family:    Frequency of Social Gatherings with Friends and Family:    Attends Religious Services:    Active Member of Clubs or Organizations:    Attends Music therapist:    Marital Status:   Intimate Partner Violence:    Fear of Current or Ex-Partner:    Emotionally Abused:    Physically Abused:    Sexually Abused:   :  Review of Systems: A comprehensive 14 point review of systems was negative except as noted in the HPI.  Exam: Patient Vitals for the past 24 hrs:  BP Temp Temp src Pulse Resp SpO2  04/17/20 0555 (!) 153/83 98 F (36.7 C) Oral 90 15 99 %  04/16/20 2018 133/72 98.3 F (36.8 C) Oral 87 14 96 %    General: Awake and alert, no acute distress Eyes:  no scleral icterus.   ENT:  There were no oropharyngeal lesions.   Neck was without thyromegaly.   Lymphatics:  Negative cervical, supraclavicular or axillary adenopathy.   Respiratory: lungs were clear bilaterally without wheezing or crackles.   Cardiovascular:  Regular rate and rhythm, S1/S2, without murmur, rub or gallop.  There was trace pedal edema.   GI:  Hypoactive bowel sounds, mild distention, nontender.  It is distended with ascites.  She has tinkling bowel sounds, consistent with imminent signs of small bowel obstruction Musculoskeletal:  Symmetrical in the upper and lower extremities. Skin exam was without echymosis, petichae.   Neuro exam was nonfocal. Patient was alert and oriented.  Attention was good.   Language was appropriate.  Mood was normal without depression.  Speech was not pressured.  Thought content was not tangential.     Lab Results  Component Value Date   WBC 8.7 04/17/2020   HGB 13.0 04/17/2020   HCT 39.5 04/17/2020   PLT 348 04/17/2020   GLUCOSE 90 04/17/2020   CHOL 222 (H) 04/22/2019   TRIG 157 (H) 04/22/2019   HDL 40 04/22/2019   LDLCALC 151 (H) 04/22/2019   ALT 12 04/13/2020   AST 14 (L) 04/13/2020   NA 130 (L) 04/17/2020   K 3.9 04/17/2020   CL 101 04/17/2020   CREATININE 1.40 (H) 04/17/2020   BUN 14 04/17/2020   CO2 15 (L) 04/17/2020   I have personally reviewed her CT imaging and agree with the interpretation as follows  DG Chest 2 View  Result Date: 04/13/2020 CLINICAL DATA:  Weakness diarrhea EXAM: CHEST - 2 VIEW COMPARISON:  07/28/2004 FINDINGS: Heart size upper normal.  Vascularity normal.  Coronary stent noted. Small bilateral pleural effusions. Mild bibasilar atelectasis/infiltrate. IMPRESSION: Interval development of mild bibasilar airspace disease and small pleural effusions. Negative for heart failure. Electronically Signed   By: Franchot Gallo M.D.   On: 04/13/2020 13:57   CT CHEST W CONTRAST  Result Date: 04/15/2020 CLINICAL DATA:  Cancer of unknown primary, staging. EXAM: CT CHEST WITH CONTRAST TECHNIQUE: Multidetector CT imaging of the chest was performed during intravenous contrast administration. CONTRAST:  46mL OMNIPAQUE IOHEXOL 300 MG/ML  SOLN COMPARISON:  None. FINDINGS: Cardiovascular: There is mild calcification of the aortic arch. Normal heart size. No pericardial effusion. A coronary artery stent is seen. Mediastinum/Nodes: No enlarged mediastinal, hilar, or axillary lymph nodes. The thyroid gland and trachea demonstrate no significant findings. There is a small hiatal  hernia. Lungs/Pleura: Mild bilateral lower lobe atelectasis is seen, right slightly greater than left. Very mild slightly nodular appearing scarring and/or atelectasis is seen along the posterior aspect of the inferior left upper lobe. There is a small left pleural effusion. A moderate sized right pleural effusion is also noted. No pneumothorax is identified. Upper Abdomen: A moderate amount of abdominal free fluid is seen. There is moderate to marked severity right-sided hydronephrosis with delayed renal cortical enhancement involving the right kidney. Musculoskeletal: No chest wall abnormality. No acute or significant osseous findings. Degenerative changes seen throughout the thoracic spine. IMPRESSION: 1. Small left pleural effusion with a moderate sized right pleural effusion. 2. Mild bilateral lower lobe atelectasis, right slightly greater than left. 3. Very mild slightly nodular appearing scarring and/or atelectasis along the posterior aspect of the inferior left upper lobe. 4. Moderate amount of abdominal free fluid. 5. Moderate to marked severity right-sided hydronephrosis with delayed renal cortical enhancement involving the right kidney. This is suggestive of partial renal obstruction. 6. Small hiatal hernia. 7. Aortic atherosclerosis. Aortic Atherosclerosis (ICD10-I70.0). Electronically Signed   By: Virgina Norfolk M.D.   On: 04/15/2020 15:38   DG C-Arm 1-60 Min-No Report  Result Date: 04/15/2020 Fluoroscopy was utilized by the requesting physician.  No radiographic interpretation.   CT RENAL STONE STUDY  Result Date: 04/14/2020 CLINICAL DATA:  64 year old female with hematuria. EXAM: CT ABDOMEN  AND PELVIS WITHOUT CONTRAST TECHNIQUE: Multidetector CT imaging of the abdomen and pelvis was performed following the standard protocol without IV contrast. COMPARISON:  None. FINDINGS: Evaluation of this exam is limited in the absence of intravenous contrast. Lower chest: Partially visualized moderate  right and small left pleural effusions. There is associated partial compressive atelectasis of the lower lobes. Pneumonia is not excluded. Clinical correlation is recommended. There is coronary vascular calcification. Partially visualized trace pericardial effusion. There is no intra-abdominal free air. Small ascites. Hepatobiliary: No focal liver abnormality is seen. No gallstones, gallbladder wall thickening, or biliary dilatation. Pancreas: Unremarkable. No pancreatic ductal dilatation or surrounding inflammatory changes. Spleen: Normal in size without focal abnormality. Adrenals/Urinary Tract: The adrenal glands unremarkable. The left kidney is unremarkable. There is a moderate right hydronephrosis with mild right hydroureter. No stone identified. There is a transition point in the distal right ureter or right UVJ. The urinary bladder is minimally distended. There is possible mild nodular thickening of the posterior bladder wall adjacent to the right UVJ (77/2). There is loss of fat plane between the anterior lower uterus/cervix and posterior bladder wall. The lower uterus/cervical region is somewhat enlarged. Findings concerning for a neoplastic process either arising from the bladder urothelium or from the cervix and invading into the posterior bladder wall with obstruction of the right UVJ or distal right ureter. Stomach/Bowel: There is a small hiatal hernia. There is no bowel obstruction. There is sigmoid diverticulosis. The appendix is not visualized with certainty. No inflammatory changes identified in the right lower quadrant. Vascular/Lymphatic: Moderate aortoiliac atherosclerotic disease. The IVC is unremarkable. No portal venous gas. There is no adenopathy. Reproductive: The uterus is anteverted. Enlargement of the lower uterus/cervical region as described above. Other: There is diffuse omental nodularity and caking consistent with metastatic disease. Musculoskeletal: Osteopenia. No acute osseous  pathology. IMPRESSION: 1. Findings concerning for a neoplastic process either arising from the bladder urothelium or from the cervix and invading into the posterior bladder wall with obstruction of the right UVJ or distal right ureter. There is associated moderate right hydronephrosis. Sampling of the ascitic fluid may provide further diagnostic information. 2. Diffuse omental nodularity and caking consistent with metastatic disease. 3. Moderate right and small left pleural effusions with associated partial compressive atelectasis of the lower lobes. Pneumonia is not excluded. Clinical correlation is recommended. 4. Sigmoid diverticulosis. No bowel obstruction. 5. Aortic Atherosclerosis (ICD10-I70.0). Electronically Signed   By: Anner Crete M.D.   On: 04/14/2020 17:44     DG Chest 2 View  Result Date: 04/13/2020 CLINICAL DATA:  Weakness diarrhea EXAM: CHEST - 2 VIEW COMPARISON:  07/28/2004 FINDINGS: Heart size upper normal.  Vascularity normal.  Coronary stent noted. Small bilateral pleural effusions. Mild bibasilar atelectasis/infiltrate. IMPRESSION: Interval development of mild bibasilar airspace disease and small pleural effusions. Negative for heart failure. Electronically Signed   By: Franchot Gallo M.D.   On: 04/13/2020 13:57   CT CHEST W CONTRAST  Result Date: 04/15/2020 CLINICAL DATA:  Cancer of unknown primary, staging. EXAM: CT CHEST WITH CONTRAST TECHNIQUE: Multidetector CT imaging of the chest was performed during intravenous contrast administration. CONTRAST:  14mL OMNIPAQUE IOHEXOL 300 MG/ML  SOLN COMPARISON:  None. FINDINGS: Cardiovascular: There is mild calcification of the aortic arch. Normal heart size. No pericardial effusion. A coronary artery stent is seen. Mediastinum/Nodes: No enlarged mediastinal, hilar, or axillary lymph nodes. The thyroid gland and trachea demonstrate no significant findings. There is a small hiatal hernia. Lungs/Pleura: Mild bilateral lower lobe atelectasis  is seen, right slightly greater than left. Very mild slightly nodular appearing scarring and/or atelectasis is seen along the posterior aspect of the inferior left upper lobe. There is a small left pleural effusion. A moderate sized right pleural effusion is also noted. No pneumothorax is identified. Upper Abdomen: A moderate amount of abdominal free fluid is seen. There is moderate to marked severity right-sided hydronephrosis with delayed renal cortical enhancement involving the right kidney. Musculoskeletal: No chest wall abnormality. No acute or significant osseous findings. Degenerative changes seen throughout the thoracic spine. IMPRESSION: 1. Small left pleural effusion with a moderate sized right pleural effusion. 2. Mild bilateral lower lobe atelectasis, right slightly greater than left. 3. Very mild slightly nodular appearing scarring and/or atelectasis along the posterior aspect of the inferior left upper lobe. 4. Moderate amount of abdominal free fluid. 5. Moderate to marked severity right-sided hydronephrosis with delayed renal cortical enhancement involving the right kidney. This is suggestive of partial renal obstruction. 6. Small hiatal hernia. 7. Aortic atherosclerosis. Aortic Atherosclerosis (ICD10-I70.0). Electronically Signed   By: Virgina Norfolk M.D.   On: 04/15/2020 15:38   DG C-Arm 1-60 Min-No Report  Result Date: 04/15/2020 Fluoroscopy was utilized by the requesting physician.  No radiographic interpretation.   CT RENAL STONE STUDY  Result Date: 04/14/2020 CLINICAL DATA:  64 year old female with hematuria. EXAM: CT ABDOMEN AND PELVIS WITHOUT CONTRAST TECHNIQUE: Multidetector CT imaging of the abdomen and pelvis was performed following the standard protocol without IV contrast. COMPARISON:  None. FINDINGS: Evaluation of this exam is limited in the absence of intravenous contrast. Lower chest: Partially visualized moderate right and small left pleural effusions. There is associated  partial compressive atelectasis of the lower lobes. Pneumonia is not excluded. Clinical correlation is recommended. There is coronary vascular calcification. Partially visualized trace pericardial effusion. There is no intra-abdominal free air. Small ascites. Hepatobiliary: No focal liver abnormality is seen. No gallstones, gallbladder wall thickening, or biliary dilatation. Pancreas: Unremarkable. No pancreatic ductal dilatation or surrounding inflammatory changes. Spleen: Normal in size without focal abnormality. Adrenals/Urinary Tract: The adrenal glands unremarkable. The left kidney is unremarkable. There is a moderate right hydronephrosis with mild right hydroureter. No stone identified. There is a transition point in the distal right ureter or right UVJ. The urinary bladder is minimally distended. There is possible mild nodular thickening of the posterior bladder wall adjacent to the right UVJ (77/2). There is loss of fat plane between the anterior lower uterus/cervix and posterior bladder wall. The lower uterus/cervical region is somewhat enlarged. Findings concerning for a neoplastic process either arising from the bladder urothelium or from the cervix and invading into the posterior bladder wall with obstruction of the right UVJ or distal right ureter. Stomach/Bowel: There is a small hiatal hernia. There is no bowel obstruction. There is sigmoid diverticulosis. The appendix is not visualized with certainty. No inflammatory changes identified in the right lower quadrant. Vascular/Lymphatic: Moderate aortoiliac atherosclerotic disease. The IVC is unremarkable. No portal venous gas. There is no adenopathy. Reproductive: The uterus is anteverted. Enlargement of the lower uterus/cervical region as described above. Other: There is diffuse omental nodularity and caking consistent with metastatic disease. Musculoskeletal: Osteopenia. No acute osseous pathology. IMPRESSION: 1. Findings concerning for a neoplastic  process either arising from the bladder urothelium or from the cervix and invading into the posterior bladder wall with obstruction of the right UVJ or distal right ureter. There is associated moderate right hydronephrosis. Sampling of the ascitic fluid may provide further diagnostic information. 2. Diffuse  omental nodularity and caking consistent with metastatic disease. 3. Moderate right and small left pleural effusions with associated partial compressive atelectasis of the lower lobes. Pneumonia is not excluded. Clinical correlation is recommended. 4. Sigmoid diverticulosis. No bowel obstruction. 5. Aortic Atherosclerosis (ICD10-I70.0). Electronically Signed   By: Anner Crete M.D.   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, biopsy pending Final pathology from this cervical biopsy pending However frozen section from cervical biopsy consistent with adenocarcinoma -we will await final pathology CA-125 is elevated at 208 Patient has been seen by GYN oncology is not a surgical candidate; they have recommended systemic therapy I reviewed the plan of care with the patient and her son The preliminary findings are indicated of stage IV cervical cancer although results are not fully available She will need inpatient chemotherapy given significant findings of carcinomatosis Without chemotherapy, there is no hope that the carcinomatosis will resolve Given her chronic renal failure, and her frail status right now, it is not likely she can tolerate cisplatin-based treatment I will plan on giving her first dose inpatient carboplatin and paclitaxel on Thursday, assuming we will get final results of her biopsy tomorrow I will get inpatient nurses to give her chemo education class The patient is told she has stage IV disease that treatment goal is strictly palliative in  nature  Hyponatremia Appears to be due to to hypovolemia from poor p.o. intake and hydrochlorothiazide use Sodium level improving with IV hydration and discontinuation of hydrochlorothiazide Monitor closely  AKI Due to dehydration/poor p.o. intake HCTZ and lisinopril placed on hold Status post ureteral stent Creatinine slowly improving with hydration  Protein calorie malnutrition Recommend dietitian consult  Abdominal carcinomatosis, tingling bowel sounds, nausea, imminent bowel obstruction I discussed this with GYN surgeon Her diet is changed to liquid only We will monitor closely If she has another bout of episode of nausea/vomiting, I recommend abdominal imaging to rule out bowel obstruction and if she have signs of bowel obstruction, I would recommend placement of NG tube  Hypertension Off lisinopril and hydrochlorothiazide secondary to AKI Hydralazine ordered as needed for systolic blood pressure greater than 160 Further management per hospitalist  CAD status post PTCA On metoprolol; aspirin on hold  Possible UTI versus pneumonia versus compression atelectasis She is on IV antibiotics Even though she had poor venous access, I do not recommend port placement right now If she needs additional IV access, I recommend placement of PICC line  Goals of care discussion She is aware she have stage IV disease Treatment goal is palliative The goal for treatment in this hospital stay is to resolve her bowel obstruction prior to discharge  Discharge planning She will likely be here for the next 3 to 5 days  Thank you for this referral.   Mikey Bussing, DNP, AGPCNP-BC, AOCNP [name], MD

## 2020-04-17 NOTE — Consult Note (Signed)
GYNECOLOGIC ONCOLOGY INPATIENT CONSULTATION  Date of Service: 04/17/20 Requesting Service: [facility] Requesting Provider: Dr. Estill Cotta Consulting Provider: Jeral Pinch, MD   HISTORY OF PRESENT ILLNESS: Eileen Burgess is a 64 y.o. woman who is seen in consultation at the request of Dr. 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  Patient reports being in her normal state of health until the last month or so.  In retrospect, she notes having some increased lower abdominal pressure prior to that time that she thought was related to prolapse symptoms because she was always on her feet at work.  In mid July, she developed diarrhea for a couple of days.  She felt very weak afterwards and had almost recovered when she had another bout of diarrhea for several days.  Last week, she was just unable to recover her energy.  Her son was going to take her in to be seen but she did not have the energy on Wednesday to do this.  Ultimately, she asked him to take her to the hospital on Friday.  For the last couple of weeks, she reports that her appetite has been up and down.  Her bowel function has overall been more solid than what is normal for her.  She usually has stools that are on the loose side.  In terms of her urination, she notes increased pelvic pressure as well as feeling that her stream is "weaker".  She is unsure whether she has had hematuria but notes some pink either on the sanitary pad that she wears or when she wipes after voiding that has been intermittent for some time now.  In terms of her gynecologic history, she went through menopause in approximately 2000.  At the time of some family turmoil, she restarted bleeding in October 2018.  Since that time, she describes bleeding that is similar to a monthly menses.  This is regular bleeding that will last for 1-2 weeks.  She thinks that her last Pap smear was in 2016.  She is on aware of any  abnormal Pap smears in her life.  Her medical history is notable for cardiac disease.  She sees a cardiologist for her history of coronary artery disease, status post stent placement in 2005, hypertension, and hyperlipidemia.  Her last visit was in October 2020.  She denies any significant pulmonary history.  In terms of surgical history, she has had a C-section as well as her appendix and a ovarian cyst removed sometime ago.  Patient lives in [place] with her son and his wife.  She retired last year.  She denies any current alcohol or tobacco use.  She quit in 2005 after her heart attack. Her husband passed away from lung cancer related to smoking.  PAST MEDICAL HISTORY: Past Medical History:  Diagnosis Date  . CAD S/P percutaneous coronary angioplasty 2005   PCI to pLAD 99%; 3.0 mm x 38 mm Cypher DES; EF ~45% - distal, septal apical hypokinesis    . Dyslipidemia, goal LDL below 70   . Essential hypertension   . GERD (gastroesophageal reflux disease)   . 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 Insomnia     PAST SURGICAL HISTORY: Past Surgical History:  Procedure Laterality Date  . APPENDECTOMY  12/1968   at same time as her ovarian cyst surgery  . CESAREAN SECTION  05/18/1983  . CORONARY ANGIOPLASTY WITH STENT PLACEMENT  2005   prox LAD 3.0 mm x 28 mm Cypher  DES; circumflex is nondominant with 2 large EOMs. Dominant RCA is a large PDA and posterolateral branch.  . CYSTOSCOPY W/ URETERAL STENT PLACEMENT Right 04/15/2020   Procedure: CYSTOSCOPY WITH RETROGRADE PYELOGRAM/URETERAL STENT PLACEMENT;  Surgeon: Raynelle Bring, MD;  Location: [facility];  Service: Urology;  Laterality: Right;  . NM MYOVIEW LTD  10/2012   8:50 min; 9 METS.  No ischemia or Infarction; EF ~68%  . OVARIAN CYST REMOVAL  12/1968  . TRANSURETHRAL RESECTION OF BLADDER TUMOR N/A 04/15/2020   Procedure: bladder biopsy with fulguration of bleeders;  Surgeon: Raynelle Bring, MD;  Location: [facility];  Service: Urology;   Laterality: N/A;    OB/GYN HISTORY: OB History  Gravida Para Term Preterm AB Living  1 1          SAB TAB Ectopic Multiple Live Births               # Outcome Date GA Lbr Len/2nd Weight Sex Delivery Anes PTL Lv  1 Para             Age at menarche: 50-12 Age at menopause: 22 Hx of HRT: denies Hx of STDs: denies Last pap: 2016 per patient report History of abnormal pap smears: denies  SCREENING STUDIES:  Last mammogram: n/a Last colonoscopy: n/a  MEDICATIONS:  Current Facility-Administered Medications:  .  0.9 %  sodium chloride infusion, , Intravenous, Continuous, Rai, Ripudeep K, MD, Last Rate: 60 mL/hr at 04/17/20 0058, Restarted at 04/17/20 0058 .  acetaminophen (TYLENOL) tablet 650 mg, 650 mg, Oral, Q6H PRN **OR** acetaminophen (TYLENOL) suppository 650 mg, 650 mg, Rectal, Q6H PRN, Raynelle Bring, MD .  cefTRIAXone (ROCEPHIN) 1 g in sodium chloride 0.9 % 100 mL IVPB, 1 g, Intravenous, Q24H, Raynelle Bring, MD, Stopping Infusion hung by another clincian at 04/16/20 1736 .  hydrALAZINE (APRESOLINE) tablet 25 mg, 25 mg, Oral, Q6H PRN, Raynelle Bring, MD .  metoprolol succinate (TOPROL-XL) 24 hr tablet 25 mg, 25 mg, Oral, Daily, Raynelle Bring, MD, 25 mg at 04/16/20 0949 .  nitroGLYCERIN (NITROSTAT) SL tablet 0.4 mg, 0.4 mg, Sublingual, Q5 Min x 3 PRN, Raynelle Bring, MD .  ondansetron [facility]) tablet 4 mg, 4 mg, Oral, Q6H PRN **OR** ondansetron (ZOFRAN) injection 4 mg, 4 mg, Intravenous, Q6H PRN, Raynelle Bring, MD, 4 mg at 04/16/20 0811 .  pantoprazole (PROTONIX) EC tablet 40 mg, 40 mg, Oral, Daily, Raynelle Bring, MD, 40 mg at 04/16/20 0949 .  phenol (CHLORASEPTIC) mouth spray 1 spray, 1 spray, Mouth/Throat, PRN, Rai, Ripudeep K, MD .  prochlorperazine (COMPAZINE) injection 10 mg, 10 mg, Intravenous, Q6H PRN, Rai, Ripudeep K, MD, 10 mg at 04/16/20 0844  ALLERGIES: Allergies  Allergen Reactions  . Shrimp [Shellfish Allergy] Anaphylaxis  . Crestor [Rosuvastatin] Other (See  Comments)    LEGS HURTING  . Pravastatin Other (See Comments)    myalagia  . Vytorin [Ezetimibe-Simvastatin] Other (See Comments)    myalgia  . Wasp Venom Hives    FAMILY HISTORY: Family History  Problem Relation Age of Onset  . Stroke Mother   . Hypertension Mother   . Hyperlipidemia Father   . Cancer Maternal Grandmother        unsure type, thinks may be gyn  . Hyperlipidemia Son        not known, pt assumes because of poor diet  . Breast cancer Neg Hx   . Colon cancer Neg Hx     SOCIAL HISTORY: Social History   Socioeconomic History  . Marital status:  Widowed    Spouse name: Not on file  . Number of children: Not on file  . Years of education: Not on file  . Highest education level: Not on file  Occupational History  . Occupation: retired  Tobacco Use  . Smoking status: Former Smoker    Quit date: 07/03/2004    Years since quitting: 15.8  . Smokeless tobacco: Never Used  Substance and Sexual Activity  . Alcohol use: No  . Drug use: No  . Sexual activity: Not Currently  Other Topics Concern  . Not on file  Social History Narrative   Widowed.   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   Has been quite active, but no routine exercise.   Quit Smoking in 2005 before her MI.  Does not drink alcohol.   Social Determinants of Health   Financial Resource Strain:   . Difficulty of Paying Living Expenses:   Food Insecurity:   . Worried About Charity fundraiser in the Last Year:   . Arboriculturist in the Last Year:   Transportation Needs:   . Film/video editor (Medical):   Marland Kitchen Lack of Transportation (Non-Medical):   Physical Activity:   . Days of Exercise per Week:   . Minutes of Exercise per Session:   Stress:   . Feeling of Stress :   Social Connections:   . Frequency of Communication with Friends and Family:   . Frequency of Social Gatherings with Friends and Family:   . Attends Religious  Services:   . Active Member of Clubs or Organizations:   . Attends Archivist Meetings:   Marland Kitchen Marital Status:   Intimate Partner Violence:   . Fear of Current or Ex-Partner:   . Emotionally Abused:   Marland Kitchen Physically Abused:   . Sexually Abused:     REVIEW OF SYSTEMS: + appetite changes, fatigue, cough, abdominal pain, nausea, early satiety, dysuria, frequency, hematuria, incontinence, vaginal bleeding, pelvic pain, vaginal discharge, joint and back pain, muscle pain/cramps, difficulty walking, anxiety. Denies fevers, chills, unexplained weight changes. Denies hearing loss, neck lumps or masses, mouth sores, ringing in ears or voice changes. Denies wheezing.   Denies chest pain or palpitations. Denies leg swelling. Denies abdominal distention, blood in stools, constipation, vomiting. Denies hot flashes.   Denies itching, rash, or wounds. Denies dizziness, headaches, numbness or seizures. Denies swollen lymph nodes or glands, denies easy bruising or bleeding. Denies depression, confusion, or decreased concentration.  PHYSICAL EXAM: BP (!) 153/83 (BP Location: Left Arm)   Pulse 90   Temp 98 F (36.7 C) (Oral)   Resp 15   Ht 5\' 4"  (1.626 m)   Wt 216 lb 0.8 oz (98 kg)   LMP  (LMP Unknown)   SpO2 99%   BMI 37.09 kg/m  General: Alert, oriented, no acute distress but anxious. HEENT: Normocephalic, atraumatic.  Sclera anicteric. Chest: Mildly decreased breath sounds in bilateral lung bases, otherwise clear to auscultation bilaterally. Cardiovascular: Regular rate and rhythm, no murmurs, rubs, or gallops. Abdomen: Obese.  Normoactive bowel sounds.  Mildly tense throughout, nondistended, nontender to palpation.  No masses or hepatosplenomegaly appreciated.  No evidence of hernia.  No palpable fluid wave.  Healed Pfannenstiel and right lower quadrant incisions. Extremities: Grossly normal range of motion.  Warm, well perfused.  1+ edema bilaterally. Skin: 1-2 cm raised lesion in  the central upper back, prominent vasculature. Lymphatics: Shotty  left submandibular lymph nodes; no cervical, supraclavicular, or inguinal adenopathy. GU: External genitalia without lesions.  On speculum exam, no bleeding or discharge noted.  Apex of the vagina is somewhat narrowed and the patient has discomfort with opening the speculum.  The entire cervix appears to be replaced by a vesicular mass that bleeds easily upon manipulation.  Bimanual exam reveals mass replaces the entire cervix and obliterates the vaginal fornices.  On rectovaginal exam, the cervix itself is nodular, barrel-shaped, and enlarged with parametrial involvement noted although this does not extend to either sidewall.  LABORATORY AND RADIOLOGIC DATA: CBC    Component Value Date/Time   WBC 8.7 04/17/2020 0529   RBC 4.69 04/17/2020 0529   HGB 13.0 04/17/2020 0529   HCT 39.5 04/17/2020 0529   PLT 348 04/17/2020 0529   MCV 84.2 04/17/2020 0529   MCH 27.7 04/17/2020 0529   MCHC 32.9 04/17/2020 0529   RDW 13.8 04/17/2020 0529   LYMPHSABS 0.8 04/13/2020 1330   MONOABS 0.9 04/13/2020 1330   EOSABS 0.0 04/13/2020 1330   BASOSABS 0.0 04/13/2020 1330   CMP Latest Ref Rng & Units 04/17/2020 04/16/2020 04/15/2020  Glucose 70 - 99 mg/dL 90 105(H) 96  BUN 8 - 23 mg/dL 14 11 12   Creatinine 0.44 - 1.00 mg/dL 1.40(H) 1.40(H) 1.25(H)  Sodium 135 - 145 mmol/L 130(L) 129(L) 130(L)  Potassium 3.5 - 5.1 mmol/L 3.9 4.4 4.2  Chloride 98 - 111 mmol/L 101 99 101  CO2 22 - 32 mmol/L 15(L) 18(L) 19(L)  Calcium 8.9 - 10.3 mg/dL 8.6(L) 8.6(L) 8.0(L)  Total Protein 6.5 - 8.1 g/dL - - -  Total Bilirubin 0.3 - 1.2 mg/dL - - -  Alkaline Phos 38 - 126 U/L - - -  AST 15 - 41 U/L - - -  ALT 0 - 44 U/L - - -   CT renal:  IMPRESSION: 1. Findings concerning for a neoplastic process either arising from the bladder urothelium or from the cervix and invading into the posterior bladder wall with obstruction of the right UVJ or distal right ureter.  There is associated moderate right hydronephrosis. Sampling of the ascitic fluid may provide further diagnostic information. 2. Diffuse omental nodularity and caking consistent with metastatic disease. 3. Moderate right and small left pleural effusions with associated partial compressive atelectasis of the lower lobes. Pneumonia is not excluded. Clinical correlation is recommended. 4. Sigmoid diverticulosis. No bowel obstruction. 5. Aortic Atherosclerosis (ICD10-I70.0).  CT chest: IMPRESSION: 1. Small left pleural effusion with a moderate sized right pleural effusion. 2. Mild bilateral lower lobe atelectasis, right slightly greater than left. 3. Very mild slightly nodular appearing scarring and/or atelectasis along the posterior aspect of the inferior left upper lobe. 4. Moderate amount of abdominal free fluid. 5. Moderate to marked severity right-sided hydronephrosis with delayed renal cortical enhancement involving the right kidney. This is suggestive of partial renal obstruction. 6. Small hiatal hernia. 7. Aortic atherosclerosis.  ASSESSMENT AND PLAN: Eileen Burgess is a 64 y.o. woman with advanced cervical cancer.  Based on my exam today, findings are consistent with an advanced cervical cancer.  Her cervix and upper vagina are replaced by tumor that was biopsied today.  One biopsy was sent for frozen section to assure diagnostic.  Reading pathologist thinks that it looks glandular, consistent with an adenocarcinoma.  Will await final results from additional biopsies taken.  I reviewed both my exam findings as well as her recent imaging with the patient.  We discussed the large cervical/lower  uterine segment mass, compression and loss of a plane between the cervix and the bladder, impingement on the right ureter causing hydronephrosis and acute kidney injury, ascites, peritoneal/omental disease, and bilateral effusions (likely malignant).  Given the extent of disease, I discussed  with the patient that she would not be a candidate for surgery at this time and that we will likely be recommending systemic therapy.  I have reached out to Dr. Alvy Bimler she will plan to see the patient tomorrow.  If this ends up being most consistent with an advanced cervical cancer, then we will likely discuss systemic therapy with platinum, taxane, and bevacizumab.  In terms of her acute kidney injury, I suspect that this was related to extrinsic compression of the ureter by her tumor burden as well as some dehydration in the setting of multiple recent episodes of diarrhea and decreased p.o. intake.  Her baseline creatinine over the last several years appears to be just over 1.  There was some improvement in her creatinine after stent placement, but her creatinine rose again, likely in the setting of contrast with her chest CT.  We will continue to trend her kidney function.  The patient's albumin was borderline at 3.4 when she presented.  I would recommend repeating this now that she is hydrated.  I suspect it will be less than 3.  If she is not already on nutritional supplements, I recommend starting these.  Recs: 1. Biopsies performed, await final pathology. Dr. Alvy Bimler (med onc) to see patient tomorrow 2. Continue to trend creatinine 3. Start nutritional supplementation, recheck albumin, check prealbumin  The above assessment and plan was communicated to the patient's primary team.  Jeral Pinch MD Gynecologic Oncology

## 2020-04-17 NOTE — Progress Notes (Signed)
[facility]                                                                              Patient Demographics  Eileen Burgess, is a 64 y.o. female, DOB - [birth year], AOZ:[id number]  Admit date - 04/13/2020   Admitting Physician Guilford Shi, MD  Outpatient Primary MD for the patient is Patient, No Pcp Per  Outpatient specialists:   LOS - 3  days   Medical records reviewed and are as summarized below:    Chief complaint: 3 weeks of episodic diarrhea, nausea, decreased oral intake, dysuria.      Brief summary   64 y.o.femalepast medical history of CAD status post angioplasty to the LAD, essential hypertension history of tobacco abuse she is currently non-smoking, GERD that comes into the hospital for 3 weeks of episodic diarrhea that has progressively gotten worse, she has not received any antibiotics over the last year, she is also been nauseated with loss of appetite and decreased oral intake despite this she has continued to take her antihypertensive medication, she does relate that on the day of admission she started having some dysuria,she denies any urgency frequency hematuria or hematochezia. She relates some dizziness upon standing she relates she received a Covid vaccine more than 8 months ago. ED Course: Afebrile,Vital signs are stable- saturations are greater than 97%, she was found to be hyponatremic, new acute renal failure with elevated AST and ALT unremarkable, UA  positive for bacteria and hemoglobin large, WBC/RBC>  50    Assessment & Plan    Abdominal pain with dysuria, AKI, right hydronephrosis,  Metastatic omental carcinomatosis.  Cervical cancer- new diagnosis -CT abdomen pelvis showed possible neoplastic processes arising from the bladder urothelium or from cervix and invading into the posterior bladder wall with obstruction of right UVJ or distal right ureter, moderate right hydronephrosis, diffuse omental nodularity and caking  consistent with metastatic disease. Moderate right and small left pleural effusions, associated partial compressive atelectasis of lower lobes.  Discussed with the patient, no prior history of malignancy (just had a history of ovarian cyst at the age of 53 years). -Urology consulted, s/p right ureteral stent placement on 8/14.  Biopsy of the posterior bladder with intraoperative findings suggest extrinsic mass invading bladder rather than primary bladder malignancy.   -Ca 125 elevated 208, CEA normal -CT chest with contrast showed small pleural effusion left, moderate sized right pleural effusion, moderate amount of abdominal free fluid, moderate to marked severe right-sided hydronephrosis.  No lung lesions. -Gyn-Onc consulted, seen by Dr. Berline Lopes today, consistent with cervical cancer, prelim biopsy appears to be adenocarcinoma, also consulted with Dr. Alvy Bimler.    Hyponatremia, dehydration -Appears to be hypovolemic related to recent diarrhea and poor p.o. intake, HCTZ use vs SIADH from ?malignancy.  Serum osmolality 268, urine osmolality 617, urine sodium 50 -Nausea vomiting improved today, restart soft diet, decrease IV fluids to 60 cc an hour until consistently eating   Acute kidney injury -In the setting of #1, dehydration, HCTZ use -Continue to hold lisinopril, HCTZ -Status post ureteral stent -Creatinine plateaued at 1.4, continue gentle hydration  Hypertension -BP stable, continue metoprolol  -  continue metoprolol, avoid diuretics, ACE inhibitor  CAD status post PTCA Continue metoprolol, aspirin  GERD Continue PPI  Obesity Estimated body mass index is 37.09 kg/m as calculated from the following:   Height as of this encounter: 5\' 4"  (1.626 m).   Weight as of this encounter: 98 kg.  Code Status: Full code DVT Prophylaxis: Change to heparin subcu Family Communication: Discussed all imaging results, lab results, explained to the patient and son, Roderic Palau on the phone today      Disposition Plan:     Status is: Inpatient  Remains inpatient appropriate because:Inpatient level of care appropriate due to severity of illness   Dispo: The patient is from: Home              Anticipated d/c is to: Home              Anticipated d/c date is: > 3 days              Patient currently is not medically stable to d/c.      Time Spent in minutes 45 minutes  Procedures:  CT renal stone study 04/15/20 : Right ureteral stent placement and posterior bladder biopsy  Consultants:   Urology IR Oncology Gyn Oncology - Dr Berline Lopes   Antimicrobials:   Anti-infectives (From admission, onward)   Start     Dose/Rate Route Frequency Ordered Stop   04/15/20 1547  sodium chloride 0.9 % with cefTRIAXone (ROCEPHIN) ADS Med       Note to Pharmacy: Nunzio Cobbs : cabinet override      04/15/20 1547 04/16/20 0359   04/13/20 1700  cefTRIAXone (ROCEPHIN) 1 g in sodium chloride 0.9 % 100 mL IVPB     Discontinue     1 g 200 mL/hr over 30 Minutes Intravenous Every 24 hours 04/13/20 1635 04/17/20 1659         Medications  Scheduled Meds: . metoprolol succinate  25 mg Oral Daily  . pantoprazole  40 mg Oral Daily   Continuous Infusions: . sodium chloride 60 mL/hr at 04/17/20 1100  . cefTRIAXone (ROCEPHIN)  IV Stopped (04/16/20 1736)   PRN Meds:.acetaminophen **OR** acetaminophen, hydrALAZINE, nitroGLYCERIN, ondansetron **OR** ondansetron (ZOFRAN) IV, phenol, prochlorperazine      Subjective:   Eileen Burgess was seen and examined today.  Nausea vomiting is improving today, no further vomiting episodes.  Constipation, complaining of gas pains in the upper abdominal area.  No chest pain or shortness of breath.   Objective:   Vitals:   04/16/20 0535 04/16/20 1425 04/16/20 2018 04/17/20 0555  BP: (!) 158/94 130/73 133/72 (!) 153/83  Pulse: 94 84 87 90  Resp: 16 15 14 15   Temp: 97.7 F (36.5 C) 98.4 F (36.9 C) 98.3 F (36.8 C) 98 F (36.7 C)  TempSrc: Oral  Oral Oral Oral  SpO2: 93% 97% 96% 99%  Weight:      Height:        Intake/Output Summary (Last 24 hours) at 04/17/2020 1333 Last data filed at 04/17/2020 0101 Gross per 24 hour  Intake 977.51 ml  Output 1000 ml  Net -22.49 ml     Wt Readings from Last 3 Encounters:  04/13/20 98 kg  06/24/19 98 kg  06/16/18 97.9 kg    Physical Exam  General: Alert and oriented x 3, NAD  Cardiovascular: S1 S2 clear, RRR. No pedal edema b/l  Respiratory: CTAB, no wheezing, rales or rhonchi  Gastrointestinal: Soft, nontender, nondistended, NBS  Ext: no pedal edema  bilaterally  Neuro: no new deficits  Musculoskeletal: No cyanosis, clubbing  Skin: No rashes  Psych: Normal affect and demeanor, alert and oriented x3    Data Reviewed:  I have personally reviewed following labs and imaging studies  Micro Results Recent Results (from the past 240 hour(s))  Urine culture     Status: Abnormal   Collection Time: 04/13/20  1:20 PM   Specimen: Urine, Random  Result Value Ref Range Status   Specimen Description   Final    URINE, RANDOM Performed at [place] [address] [place], [place] [address]    Special Requests   Final    NONE Performed at [facility], [place] [address], [place], [place] [address]    Culture (A)  Final    20,000 COLONIES/mL MULTIPLE SPECIES PRESENT, SUGGEST RECOLLECTION   Report Status 04/14/2020 FINAL  Final  SARS Coronavirus 2 by RT PCR (hospital order, performed in [facility] hospital lab) Nasopharyngeal Nasopharyngeal Swab     Status: None   Collection Time: 04/13/20  2:13 PM   Specimen: Nasopharyngeal Swab  Result Value Ref Range Status   SARS Coronavirus 2 NEGATIVE NEGATIVE Final    Comment: (NOTE) SARS-CoV-2 target nucleic acids are NOT DETECTED.  The SARS-CoV-2 RNA is generally detectable in upper and lower respiratory specimens during the acute phase of infection. The lowest concentration of SARS-CoV-2  viral copies this assay can detect is 250 copies / mL. A negative result does not preclude SARS-CoV-2 infection and should not be used as the sole basis for treatment or other patient management decisions.  A negative result may occur with improper specimen collection / handling, submission of specimen other than nasopharyngeal swab, presence of viral mutation(s) within the areas targeted by this assay, and inadequate number of viral copies (<250 copies / mL). A negative result must be combined with clinical observations, patient history, and epidemiological information.  Fact Sheet for Patients:   StrictlyIdeas.no  Fact Sheet for Healthcare Providers: BankingDealers.co.za  This test is not yet approved or  cleared by the [place] FDA and has been authorized for detection and/or diagnosis of SARS-CoV-2 by FDA under an Emergency Use Authorization (EUA).  This EUA will remain in effect (meaning this test can be used) for the duration of the COVID-19 declaration under Section 564(b)(1) of the Act, 21 U.S.C. section 360bbb-3(b)(1), unless the authorization is terminated or revoked sooner.  Performed at [facility], [place] [address], [place], [place] [address]   Culture, blood (Routine X 2) w Reflex to ID Panel     Status: None (Preliminary result)   Collection Time: 04/13/20  6:28 PM   Specimen: BLOOD  Result Value Ref Range Status   Specimen Description   Final    BLOOD RIGHT ANTECUBITAL Performed at [facility], [place] [address] [place], [place] [address]    Special Requests   Final    BOTTLES DRAWN AEROBIC AND ANAEROBIC Blood Culture adequate volume Performed at [place] [address] [place],  [address]    Culture   Final    NO GROWTH 4 DAYS Performed at [facility], [place] [address], [place],  [address]    Report Status PENDING  Incomplete  Culture,  blood (Routine X 2) w Reflex to ID Panel     Status: None (Preliminary result)   Collection Time: 04/13/20  6:40 PM   Specimen: BLOOD RIGHT HAND  Result Value Ref  Range Status   Specimen Description   Final    BLOOD RIGHT HAND Performed at [facility] [address], [place], [place] [address]    Special Requests   Final    BOTTLES DRAWN AEROBIC AND ANAEROBIC Blood Culture adequate volume Performed at [facility] [address], [place], [place] [address]    Culture   Final    NO GROWTH 4 DAYS Performed at [facility], [place] [address], [place], [place] [address]    Report Status PENDING  Incomplete  MRSA PCR Screening     Status: None   Collection Time: 04/15/20  3:04 PM   Specimen: Nasal Mucosa; Nasopharyngeal  Result Value Ref Range Status   MRSA by PCR NEGATIVE NEGATIVE Final    Comment:        The GeneXpert MRSA Assay (FDA approved for NASAL specimens only), is one component of a comprehensive MRSA colonization surveillance program. It is not intended to diagnose MRSA infection nor to guide or monitor treatment for MRSA infections. Performed at [facility], [place] [address] [place], [place] [address]     Radiology Reports DG Chest 2 View  Result Date: 04/13/2020 CLINICAL DATA:  Weakness diarrhea EXAM: CHEST - 2 VIEW COMPARISON:  07/28/2004 FINDINGS: Heart size upper normal.  Vascularity normal.  Coronary stent noted. Small bilateral pleural effusions. Mild bibasilar atelectasis/infiltrate. IMPRESSION: Interval development of mild bibasilar airspace disease and small pleural effusions. Negative for heart failure. Electronically Signed   By: Franchot Gallo M.D.   On: 04/13/2020 13:57   CT CHEST W CONTRAST  Result Date: 04/15/2020 CLINICAL DATA:  Cancer of unknown primary, staging. EXAM: CT CHEST WITH CONTRAST TECHNIQUE: Multidetector CT imaging of the chest was performed during intravenous contrast  administration. CONTRAST:  39mL OMNIPAQUE IOHEXOL 300 MG/ML  SOLN COMPARISON:  None. FINDINGS: Cardiovascular: There is mild calcification of the aortic arch. Normal heart size. No pericardial effusion. A coronary artery stent is seen. Mediastinum/Nodes: No enlarged mediastinal, hilar, or axillary lymph nodes. The thyroid gland and trachea demonstrate no significant findings. There is a small hiatal hernia. Lungs/Pleura: Mild bilateral lower lobe atelectasis is seen, right slightly greater than left. Very mild slightly nodular appearing scarring and/or atelectasis is seen along the posterior aspect of the inferior left upper lobe. There is a small left pleural effusion. A moderate sized right pleural effusion is also noted. No pneumothorax is identified. Upper Abdomen: A moderate amount of abdominal free fluid is seen. There is moderate to marked severity right-sided hydronephrosis with delayed renal cortical enhancement involving the right kidney. Musculoskeletal: No chest wall abnormality. No acute or significant osseous findings. Degenerative changes seen throughout the thoracic spine. IMPRESSION: 1. Small left pleural effusion with a moderate sized right pleural effusion. 2. Mild bilateral lower lobe atelectasis, right slightly greater than left. 3. Very mild slightly nodular appearing scarring and/or atelectasis along the posterior aspect of the inferior left upper lobe. 4. Moderate amount of abdominal free fluid. 5. Moderate to marked severity right-sided hydronephrosis with delayed renal cortical enhancement involving the right kidney. This is suggestive of partial renal obstruction. 6. Small hiatal hernia. 7. Aortic atherosclerosis. Aortic Atherosclerosis (ICD10-I70.0). Electronically Signed   By: Virgina Norfolk M.D.   On: 04/15/2020 15:38   DG C-Arm 1-60 Min-No Report  Result Date: 04/15/2020 Fluoroscopy was utilized by the requesting physician.  No radiographic interpretation.   CT RENAL STONE  STUDY  Result Date: 04/14/2020 CLINICAL DATA:  64 year old female  with hematuria. EXAM: CT ABDOMEN AND PELVIS WITHOUT CONTRAST TECHNIQUE: Multidetector CT imaging of the abdomen and pelvis was performed following the standard protocol without IV contrast. COMPARISON:  None. FINDINGS: Evaluation of this exam is limited in the absence of intravenous contrast. Lower chest: Partially visualized moderate right and small left pleural effusions. There is associated partial compressive atelectasis of the lower lobes. Pneumonia is not excluded. Clinical correlation is recommended. There is coronary vascular calcification. Partially visualized trace pericardial effusion. There is no intra-abdominal free air. Small ascites. Hepatobiliary: No focal liver abnormality is seen. No gallstones, gallbladder wall thickening, or biliary dilatation. Pancreas: Unremarkable. No pancreatic ductal dilatation or surrounding inflammatory changes. Spleen: Normal in size without focal abnormality. Adrenals/Urinary Tract: The adrenal glands unremarkable. The left kidney is unremarkable. There is a moderate right hydronephrosis with mild right hydroureter. No stone identified. There is a transition point in the distal right ureter or right UVJ. The urinary bladder is minimally distended. There is possible mild nodular thickening of the posterior bladder wall adjacent to the right UVJ (77/2). There is loss of fat plane between the anterior lower uterus/cervix and posterior bladder wall. The lower uterus/cervical region is somewhat enlarged. Findings concerning for a neoplastic process either arising from the bladder urothelium or from the cervix and invading into the posterior bladder wall with obstruction of the right UVJ or distal right ureter. Stomach/Bowel: There is a small hiatal hernia. There is no bowel obstruction. There is sigmoid diverticulosis. The appendix is not visualized with certainty. No inflammatory changes identified in the  right lower quadrant. Vascular/Lymphatic: Moderate aortoiliac atherosclerotic disease. The IVC is unremarkable. No portal venous gas. There is no adenopathy. Reproductive: The uterus is anteverted. Enlargement of the lower uterus/cervical region as described above. Other: There is diffuse omental nodularity and caking consistent with metastatic disease. Musculoskeletal: Osteopenia. No acute osseous pathology. IMPRESSION: 1. Findings concerning for a neoplastic process either arising from the bladder urothelium or from the cervix and invading into the posterior bladder wall with obstruction of the right UVJ or distal right ureter. There is associated moderate right hydronephrosis. Sampling of the ascitic fluid may provide further diagnostic information. 2. Diffuse omental nodularity and caking consistent with metastatic disease. 3. Moderate right and small left pleural effusions with associated partial compressive atelectasis of the lower lobes. Pneumonia is not excluded. Clinical correlation is recommended. 4. Sigmoid diverticulosis. No bowel obstruction. 5. Aortic Atherosclerosis (ICD10-I70.0). Electronically Signed   By: Anner Crete M.D.   On: 04/14/2020 17:44    Lab Data:  CBC: Recent Labs  Lab 04/13/20 1330 04/13/20 1829 04/14/20 0535 04/16/20 0554 04/17/20 0529  WBC 8.8 7.7 5.6 7.6 8.7  NEUTROABS 7.0  --   --   --   --   HGB 13.4 12.7 11.9* 12.2 13.0  HCT 39.3 38.3 35.6* 37.4 39.5  MCV 81.0 83.1 83.2 84.0 84.2  PLT 347 325 302 354 485   Basic Metabolic Panel: Recent Labs  Lab 04/14/20 1802 04/15/20 0530 04/15/20 1212 04/16/20 0554 04/17/20 0529  NA 127* 123* 130* 129* 130*  K 4.2 3.9 4.2 4.4 3.9  CL 97* 97* 101 99 101  CO2 21* 18* 19* 18* 15*  GLUCOSE 89 86 96 105* 90  BUN 14 12 12 11 14   CREATININE 1.42* 1.36* 1.25* 1.40* 1.40*  CALCIUM 8.3* 8.0* 8.0* 8.6* 8.6*   GFR: Estimated Creatinine Clearance: 44.9 mL/min (A) (by C-G formula based on SCr of 1.4 mg/dL  (H)). Liver Function Tests: Recent Labs  Lab 04/13/20 1330  AST 14*  ALT 12  ALKPHOS 65  BILITOT 0.7  PROT 7.1  ALBUMIN 3.4*   Recent Labs  Lab 04/13/20 1330  LIPASE 27   No results for input(s): AMMONIA in the last 168 hours. Coagulation Profile: Recent Labs  Lab 04/16/20 0554  INR 1.1   Cardiac Enzymes: No results for input(s): CKTOTAL, CKMB, CKMBINDEX, TROPONINI in the last 168 hours. BNP (last 3 results) No results for input(s): PROBNP in the last 8760 hours. HbA1C: No results for input(s): HGBA1C in the last 72 hours. CBG: No results for input(s): GLUCAP in the last 168 hours. Lipid Profile: No results for input(s): CHOL, HDL, LDLCALC, TRIG, CHOLHDL, LDLDIRECT in the last 72 hours. Thyroid Function Tests: Recent Labs    04/15/20 0530  TSH 0.678   Anemia Panel: No results for input(s): VITAMINB12, FOLATE, FERRITIN, TIBC, IRON, RETICCTPCT in the last 72 hours. Urine analysis:    Component Value Date/Time   COLORURINE YELLOW 04/13/2020 1320   APPEARANCEUR HAZY (A) 04/13/2020 1320   LABSPEC 1.019 04/13/2020 1320   PHURINE 5.0 04/13/2020 1320   GLUCOSEU NEGATIVE 04/13/2020 1320   HGBUR LARGE (A) 04/13/2020 1320   BILIRUBINUR NEGATIVE 04/13/2020 1320   KETONESUR 5 (A) 04/13/2020 1320   PROTEINUR 100 (A) 04/13/2020 1320   NITRITE NEGATIVE 04/13/2020 1320   LEUKOCYTESUR LARGE (A) 04/13/2020 1320     Johnathon Olden M.D. [facility] 04/17/2020, 1:33 PM   Call night coverage person covering after 7pm

## 2020-04-17 NOTE — Progress Notes (Signed)
Physical Therapy Treatment Patient Details Name: Eileen Burgess MRN: [id number] DOB: 03-Aug-1956 Today's Date: 04/17/2020    History of Present Illness 64 yo female admitted with UTI. Imaging (+) pelvic mass. S/P R ureteral stent placement, biopsy 8/14. Hx of CAD,MI, obesity    PT Comments    Pt overall needing slightly more assist today. Pt reports feeling tired and has had a busy morning. Pt agreeable to amb to bathroom as she was unable to void with purewick. Continue PT POC  Follow Up Recommendations  Supervision for mobility/OOB     Equipment Recommendations  Other (comment) (TBA)    Recommendations for Other Services       Precautions / Restrictions Precautions Precautions: Fall Restrictions Weight Bearing Restrictions: No    Mobility  Bed Mobility Overal bed mobility: Needs Assistance Bed Mobility: Rolling;Sidelying to Sit Rolling: Min guard Sidelying to sit: Min assist       General bed mobility comments: assist to bring trunk to upright   Transfers Overall transfer level: Needs assistance Equipment used: 1 person hand held assist Transfers: Sit to/from Stand Sit to Stand: Min guard;Min assist         General transfer comment: light assist to steady and come to stand   Ambulation/Gait Ambulation/Gait assistance: Min guard Gait Distance (Feet): 10 Feet (x2) Assistive device: IV Pole Gait Pattern/deviations: Step-through pattern;Decreased stride length     General Gait Details: use of IV pole to steady, slow, guarded gait    Stairs             Wheelchair Mobility    Modified Rankin (Stroke Patients Only)       Balance             Standing balance-Leahy Scale: Fair                              Cognition Arousal/Alertness: Awake/alert Behavior During Therapy: WFL for tasks assessed/performed Overall Cognitive Status: Within Functional Limits for tasks assessed                                         Exercises      General Comments General comments (skin integrity, edema, etc.): pt  reports feeling generally weak today      Pertinent Vitals/Pain Pain Assessment: Faces Faces Pain Scale: Hurts a little bit Pain Location: lower abd Pain Descriptors / Indicators: Discomfort;Grimacing Pain Intervention(s): Limited activity within patient's tolerance;Monitored during session;Repositioned    Home Living                      Prior Function            PT Goals (current goals can now be found in the care plan section) Acute Rehab PT Goals Patient Stated Goal: "good news" PT Goal Formulation: With patient Time For Goal Achievement: 04/30/20 Potential to Achieve Goals: Good Progress towards PT goals: Progressing toward goals    Frequency    Min 3X/week      PT Plan Current plan remains appropriate    Co-evaluation              AM-PAC PT "6 Clicks" Mobility   Outcome Measure  Help needed turning from your back to your side while in a flat bed without using bedrails?: A Little Help needed moving from lying on  your back to sitting on the side of a flat bed without using bedrails?: A Little Help needed moving to and from a bed to a chair (including a wheelchair)?: A Little Help needed standing up from a chair using your arms (e.g., wheelchair or bedside chair)?: A Little Help needed to walk in hospital room?: A Little Help needed climbing 3-5 steps with a railing? : A Little 6 Click Score: 18    End of Session Equipment Utilized During Treatment: Gait belt Activity Tolerance: Patient limited by fatigue Patient left: in bed;with call bell/phone within reach;with nursing/sitter in room Nurse Communication: Mobility status PT Visit Diagnosis: Muscle weakness (generalized) (M62.81);Unsteadiness on feet (R26.81)     Time: 5188-4166 PT Time Calculation (min) (ACUTE ONLY): 15 min  Charges:  $Gait Training: 8-22 mins                     Baxter Flattery,  PT  Acute Rehab Dept ([place]) [phone] Pager [phone]  04/17/2020    [facility] 04/17/2020, 11:10 AM

## 2020-04-17 NOTE — Care Management Important Message (Signed)
Important Message  Patient Details IM Letter given to the Patient Name: Eileen Burgess MRN: [id number] Date of Birth: 1956/06/21   Medicare Important Message Given:  Yes     [name] 04/17/2020, 9:38 AM

## 2020-04-18 ENCOUNTER — Inpatient Hospital Stay: Payer: Self-pay

## 2020-04-18 ENCOUNTER — Other Ambulatory Visit: Payer: Self-pay | Admitting: Hematology and Oncology

## 2020-04-18 DIAGNOSIS — C539 Malignant neoplasm of cervix uteri, unspecified: Secondary | ICD-10-CM

## 2020-04-18 DIAGNOSIS — N390 Urinary tract infection, site not specified: Secondary | ICD-10-CM

## 2020-04-18 DIAGNOSIS — I2510 Atherosclerotic heart disease of native coronary artery without angina pectoris: Secondary | ICD-10-CM

## 2020-04-18 DIAGNOSIS — C786 Secondary malignant neoplasm of retroperitoneum and peritoneum: Secondary | ICD-10-CM

## 2020-04-18 DIAGNOSIS — E46 Unspecified protein-calorie malnutrition: Secondary | ICD-10-CM

## 2020-04-18 DIAGNOSIS — C801 Malignant (primary) neoplasm, unspecified: Secondary | ICD-10-CM

## 2020-04-18 DIAGNOSIS — Z79899 Other long term (current) drug therapy: Secondary | ICD-10-CM

## 2020-04-18 DIAGNOSIS — Z7189 Other specified counseling: Secondary | ICD-10-CM | POA: Insufficient documentation

## 2020-04-18 LAB — BASIC METABOLIC PANEL
Anion gap: 10 (ref 5–15)
BUN: 18 mg/dL (ref 8–23)
CO2: 15 mmol/L — ABNORMAL LOW (ref 22–32)
Calcium: 8.4 mg/dL — ABNORMAL LOW (ref 8.9–10.3)
Chloride: 104 mmol/L (ref 98–111)
Creatinine, Ser: 1.3 mg/dL — ABNORMAL HIGH (ref 0.44–1.00)
GFR calc Af Amer: 50 mL/min — ABNORMAL LOW (ref >60)
GFR calc non Af Amer: 43 mL/min — ABNORMAL LOW (ref >60)
Glucose, Bld: 95 mg/dL (ref 70–99)
Potassium: 3.8 mmol/L (ref 3.5–5.1)
Sodium: 129 mmol/L — ABNORMAL LOW (ref 135–145)

## 2020-04-18 LAB — CULTURE, BLOOD (ROUTINE X 2)
Culture: NO GROWTH
Culture: NO GROWTH
Special Requests: ADEQUATE
Special Requests: ADEQUATE

## 2020-04-18 LAB — CBC
HCT: 37 % (ref 36.0–46.0)
Hemoglobin: 12.5 g/dL (ref 12.0–15.0)
MCH: 28.3 pg (ref 26.0–34.0)
MCHC: 33.8 g/dL (ref 30.0–36.0)
MCV: 83.7 fL (ref 80.0–100.0)
Platelets: 331 10*3/uL (ref 150–400)
RBC: 4.42 MIL/uL (ref 3.87–5.11)
RDW: 13.7 % (ref 11.5–15.5)
WBC: 8.7 10*3/uL (ref 4.0–10.5)
nRBC: 0 % (ref 0.0–0.2)

## 2020-04-18 MED ORDER — IPRATROPIUM-ALBUTEROL 0.5-2.5 (3) MG/3ML IN SOLN
3.0000 mL | RESPIRATORY_TRACT | Status: DC | PRN
  Administered 2020-04-19 – 2020-05-07 (×6): 3 mL via RESPIRATORY_TRACT
  Filled 2020-04-18 (×7): qty 3

## 2020-04-18 MED ORDER — HEPARIN SODIUM (PORCINE) 5000 UNIT/ML IJ SOLN
5000.0000 [IU] | Freq: Three times a day (TID) | INTRAMUSCULAR | Status: DC
  Administered 2020-04-18 – 2020-04-27 (×25): 5000 [IU] via SUBCUTANEOUS
  Filled 2020-04-18 (×24): qty 1

## 2020-04-18 NOTE — Progress Notes (Signed)
Order for PICC received.  Spoke with bedside RN, aware PICC will be placed later today or tomorrow.  Chemo to start 8-19 per RN.

## 2020-04-18 NOTE — Progress Notes (Signed)
PROGRESS NOTE    Eileen Burgess   TMH:[id number]  DOB: 1956-08-17  DOA: 04/13/2020     4  PCP: Patient, No Pcp Per  CC: diarrhea  Hospital Course: 64 y.o. female past medical history of CAD status post angioplasty to the LAD, essential hypertension history of tobacco abuse she is currently non-smoking, GERD that comes into the hospital for 3 weeks of episodic diarrhea that has progressively gotten worse, she has not received any antibiotics over the last year, she is also been nauseated with loss of appetite and decreased oral intake despite this she has continued to take her antihypertensive medication, she does relate that on the day of admission she started having some dysuria, she denies any  urgency frequency hematuria or hematochezia.  She relates some dizziness upon standing she relates she received a Covid vaccine more than 8 months ago.  ED Course: Afebrile,Vital signs are stable- saturations are greater than 97%, she was found to be hyponatremic, new acute renal failure with elevated AST and ALT unremarkable, UA  positive for bacteria and hemoglobin large, WBC/RBC>  50    Interval History:  No events overnight. Laying in bed in NAD. Notes minimal flatus and no BM. Still nauseous but no vomiting.   Old records reviewed in assessment of this patient  ROS: Constitutional: positive for fatigue, Respiratory: negative for cough, Cardiovascular: negative for chest pain and Gastrointestinal: positive for abdominal pain  Assessment & Plan: Abdominal pain with dysuria, AKI, right hydronephrosis,  Metastatic omental carcinomatosis.  Cervical cancer- new diagnosis -CT abdomen pelvis showed possible neoplastic processes arising from the bladder urothelium or from cervix and invading into the posterior bladder wall with obstruction of right UVJ or distal right ureter, moderate right hydronephrosis, diffuse omental nodularity and caking consistent with metastatic disease. Moderate right and  small left pleural effusions, associated partial compressive atelectasis of lower lobes.  Discussed with the patient, no prior history of malignancy (just had a history of ovarian cyst at the age of 65 years). -Urology consulted, s/p right ureteral stent placement on 8/14.  Biopsy of the posterior bladder with intraoperative findings suggest extrinsic mass invading bladder rather than primary bladder malignancy.   -Ca 125 elevated 208, CEA normal -CT chest with contrast showed small pleural effusion left, moderate sized right pleural effusion, moderate amount of abdominal free fluid, moderate to marked severe right-sided hydronephrosis.  No lung lesions. - frozen section consistent with adenocarcinoma; final path pending - per oncology tentative plan is for carboplatin/paclitaxel on Thursday - PICC line ordered - due to decreased flatus and ongoing nausea, now concern for developing obstruction vs ileus due to malignancy - diet de-escalated to liquid; follow up response; if develops worsening sxms will likely need NGT placed (discussed with patient and she is aware)   Hyponatremia, dehydration -Appears to be hypovolemic related to recent diarrhea and poor p.o. intake, HCTZ use vs SIADH from ?malignancy.  Serum osmolality 268, urine osmolality 617, urine sodium 50 -monitor on IVF  Acute on CKDIII - baseline creat appears to be 1.1, but GFR in 2018 mid 50s -In the setting of #1, dehydration, HCTZ use -Continue to hold lisinopril, HCTZ -Status post ureteral stent -Creatinine improving; continue following  Hypertension -BP stable, continue metoprolol  - continue metoprolol, avoid diuretics, ACE inhibitor  CAD status post PTCA Continue metoprolol, aspirin  GERD Continue PPI  Obesity Estimated body mass index is 37.09 kg/m as calculated from the following:   Height as of this encounter: 5\' 4"  (1.626  m).   Weight as of this encounter: 98 kg.  Antimicrobials: none  DVT  prophylaxis: HSQ Code Status: Full Family Communication: none present Disposition Plan:  Status is: Inpatient  Remains inpatient appropriate because:Unsafe d/c plan, IV treatments appropriate due to intensity of illness or inability to take PO and Inpatient level of care appropriate due to severity of illness   Dispo: The patient is from: Home              Anticipated d/c is to: Home              Anticipated d/c date is: 3 days              Patient currently is not medically stable to d/c.       Objective: Blood pressure 140/84, pulse 97, temperature 98.6 F (37 C), temperature source Oral, resp. rate 16, height 5\' 4"  (1.626 m), weight 98 kg, SpO2 91 %.  Examination: General appearance: alert, cooperative, fatigued and no distress Head: Normocephalic, without obvious abnormality, atraumatic Eyes: EOMI Lungs: clear to auscultation bilaterally Heart: regular rate and rhythm and S1, S2 normal Abdomen: soft, ND, BS hypoactive Extremities: no UE edema Skin: mobility and turgor normal Neurologic: Grossly normal  Consultants:   Gyn oncology  Medical oncology  Data Reviewed: I have personally reviewed following labs and imaging studies Results for orders placed or performed during the hospital encounter of 04/13/20 (from the past 24 hour(s))  CBC     Status: None   Collection Time: 04/18/20  6:07 AM  Result Value Ref Range   WBC 8.7 4.0 - 10.5 K/uL   RBC 4.42 3.87 - 5.11 MIL/uL   Hemoglobin 12.5 12.0 - 15.0 g/dL   HCT 37.0 36 - 46 %   MCV 83.7 80.0 - 100.0 fL   MCH 28.3 26.0 - 34.0 pg   MCHC 33.8 30.0 - 36.0 g/dL   RDW 13.7 11.5 - 15.5 %   Platelets 331 150 - 400 K/uL   nRBC 0.0 0.0 - 0.2 %  Basic metabolic panel     Status: Abnormal   Collection Time: 04/18/20  6:07 AM  Result Value Ref Range   Sodium 129 (L) 135 - 145 mmol/L   Potassium 3.8 3.5 - 5.1 mmol/L   Chloride 104 98 - 111 mmol/L   CO2 15 (L) 22 - 32 mmol/L   Glucose, Bld 95 70 - 99 mg/dL   BUN 18 8 -  23 mg/dL   Creatinine, Ser 1.30 (H) 0.44 - 1.00 mg/dL   Calcium 8.4 (L) 8.9 - 10.3 mg/dL   GFR calc non Af Amer 43 (L) >60 mL/min   GFR calc Af Amer 50 (L) >60 mL/min   Anion gap 10 5 - 15    Recent Results (from the past 240 hour(s))  Urine culture     Status: Abnormal   Collection Time: 04/13/20  1:20 PM   Specimen: Urine, Random  Result Value Ref Range Status   Specimen Description   Final    URINE, RANDOM Performed at [facility], [address], [place], [place] [address]    Special Requests   Final    NONE Performed at [facility], [place] [address], [place], [place] [address]    Culture (A)  Final    20,000 COLONIES/mL MULTIPLE SPECIES PRESENT, SUGGEST RECOLLECTION   Report Status 04/14/2020 FINAL  Final  SARS Coronavirus 2 by RT PCR (hospital order, performed in [facility] hospital  lab) Nasopharyngeal Nasopharyngeal Swab     Status: None   Collection Time: 04/13/20  2:13 PM   Specimen: Nasopharyngeal Swab  Result Value Ref Range Status   SARS Coronavirus 2 NEGATIVE NEGATIVE Final    Comment: (NOTE) SARS-CoV-2 target nucleic acids are NOT DETECTED.  The SARS-CoV-2 RNA is generally detectable in upper and lower respiratory specimens during the acute phase of infection. The lowest concentration of SARS-CoV-2 viral copies this assay can detect is 250 copies / mL. A negative result does not preclude SARS-CoV-2 infection and should not be used as the sole basis for treatment or other patient management decisions.  A negative result may occur with improper specimen collection / handling, submission of specimen other than nasopharyngeal swab, presence of viral mutation(s) within the areas targeted by this assay, and inadequate number of viral copies (<250 copies / mL). A negative result must be combined with clinical observations, patient history, and epidemiological information.  Fact Sheet for Patients:     StrictlyIdeas.no  Fact Sheet for Healthcare Providers: BankingDealers.co.za  This test is not yet approved or  cleared by the [place] FDA and has been authorized for detection and/or diagnosis of SARS-CoV-2 by FDA under an Emergency Use Authorization (EUA).  This EUA will remain in effect (meaning this test can be used) for the duration of the COVID-19 declaration under Section 564(b)(1) of the Act, 21 U.S.C. section 360bbb-3(b)(1), unless the authorization is terminated or revoked sooner.  Performed at [facility], [place] [address], [place], [place] [address]   Culture, blood (Routine X 2) w Reflex to ID Panel     Status: None   Collection Time: 04/13/20  6:28 PM   Specimen: BLOOD  Result Value Ref Range Status   Specimen Description   Final    BLOOD RIGHT ANTECUBITAL Performed at [facility], [place] [address] [place], [place] [address]    Special Requests   Final    BOTTLES DRAWN AEROBIC AND ANAEROBIC Blood Culture adequate volume Performed at [place] [address] [place], [place] [address]    Culture   Final    NO GROWTH 5 DAYS Performed at [facility], [place] [address] [place], [place] [address]    Report Status 04/18/2020 FINAL  Final  Culture, blood (Routine X 2) w Reflex to ID Panel     Status: None   Collection Time: 04/13/20  6:40 PM   Specimen: BLOOD RIGHT HAND  Result Value Ref Range Status   Specimen Description   Final    BLOOD RIGHT HAND Performed at [place] [address], [place], [place] [address]    Special Requests   Final    BOTTLES DRAWN AEROBIC AND ANAEROBIC Blood Culture adequate volume Performed at [place] [address] [place], [place] [address]    Culture   Final    NO GROWTH 5 DAYS Performed at [facility], [place] [address] [place], [place] [address]    Report Status  04/18/2020 FINAL  Final  MRSA PCR Screening     Status: None   Collection Time: 04/15/20  3:04 PM   Specimen: Nasal Mucosa; Nasopharyngeal  Result Value Ref Range Status   MRSA by PCR NEGATIVE NEGATIVE Final    Comment:        The GeneXpert MRSA Assay (FDA approved for NASAL specimens only), is one component of a comprehensive MRSA colonization surveillance program. It is not  intended to diagnose MRSA infection nor to guide or monitor treatment for MRSA infections. Performed at [facility], [place] [address] [place], [place] [address]      Radiology Studies: [place] EKG SITE RITE  Result Date: 04/18/2020 If Site Rite image not attached, placement could not be confirmed due to current cardiac rhythm.  [place] EKG SITE RITE  Final Result    DG C-Arm 1-60 Min-No Report  Final Result    CT CHEST W CONTRAST  Final Result    CT RENAL STONE STUDY  Final Result    DG Chest 2 View  Final Result       Scheduled Meds: . metoprolol succinate  25 mg Oral Daily  . pantoprazole  40 mg Oral Daily  . polyethylene glycol  17 g Oral Once  . senna-docusate  1 tablet Oral QHS   PRN Meds: acetaminophen **OR** acetaminophen, hydrALAZINE, nitroGLYCERIN, ondansetron **OR** ondansetron (ZOFRAN) IV, phenol, prochlorperazine, simethicone Continuous Infusions: . sodium chloride 60 mL/hr at 04/18/20 0221      LOS: 4 days  Time spent: Greater than 50% of the 35 minute visit was spent in counseling/coordination of care for the patient as laid out in the A&P.   Dwyane Dee, MD [facility] 04/18/2020, 7:15 PM   Contact via secure chat.  To contact the attending provider between 7A-7P or the covering provider during after hours 7P-7A, please log into the web site [email or web address] and access using universal [place] password for that web site. If you do not have the password, please call the hospital operator.

## 2020-04-18 NOTE — Progress Notes (Addendum)
GYN Onc Update  Subjective: Patient reports episode of emesis this morning after putting her dentures and to try to eat an English muffin. She has had several episodes of either dry heaving or small volume emesis since Sunday. Her last bowel movement was Sunday and she endorses minimal passage of flatus. She feels that her abdomen is somewhat more full today. She denies any abdominal pain. She has been up to ambulate only to the bathroom and feels weak and thus somewhat unsteady on her feet. Has had small amount of spotting since biopsies taken yesterday.  Objective: Vital signs in last 24 hours: Temp:  [97.7 F (36.5 C)-98.6 F (37 C)] 98.6 F (37 C) (08/17 0555) Pulse Rate:  [89-97] 97 (08/17 0555) Resp:  [15-16] 16 (08/17 0555) BP: (140-155)/(84-87) 140/84 (08/17 0555) SpO2:  [91 %-94 %] 91 % (08/17 0555) Last BM Date: 04/15/20  Intake/Output from previous day: No intake/output data recorded.  Physical Examination: Gen: Alert, oriented, no acute distress Cardiovascular: Regular rate and rhythm, no murmurs or rubs Pulmonary: Anterior lung fields clear to auscultation bilaterally, no wheezes or rhonchi Abdomen: Mild-moderately distended, mildly tympanic, hyperactive bowel sounds on auscultation as well as tinkling. Nontender, mildly tense. Extremities: Warm and well perfused, trace edema.  Labs: WBC/Hgb/Hct/Plts:  8.7/12.5/37.0/331 (08/17 4259) BUN/Cr/glu/ALT/AST/amyl/lip:  18/1.30/--/--/--/--/-- (08/17 0607)  Assessment/Plan:  64 y.o. s/p Procedure(s): Clinical picture and imaging concerning for metastatic gynecologic cancer, most likely adenocarcinoma of either cervical or endometrial origin. Based on her exam, I favor cervical origin. Concern for developing partial obstruction versus carcinomatous ileus  Spoke with pathology this morning. Immunohistochemical chemistry ordered on biopsies from yesterday, likely will result later today versus tomorrow. Treatment plan pending  these results. Dr. Alvy Bimler seeing patient today.  In the setting of developing partial obstruction versus carcinomatous ileus, I would at least decrease diet to liquids with low threshold for abdominal x-ray and consideration of NG tube if has additional nausea or episodes of emesis. I suspect that her picture of intermittent constipation and episodes of diarrhea over the last 4 weeks is related to diffuse carcinomatosis.  Given her malnutrition, I will reorder albumin now that she is had multiple days of IV hydration as well as her prealbumin. Once tolerating oral intake better, please start nutritional supplements.   LOS: 4 days    Lafonda Mosses 04/18/2020, 11:50 AM

## 2020-04-18 NOTE — Hospital Course (Addendum)
Ms. Schubach is a 64 y.o. female with PMH CAD s/p angioplasty to the LAD, hypertension, history of tobacco abuse she is currently non-smoking, GERD that came to the hospital due to 3 weeks of episodic diarrhea that has progressively gotten worse, she has not received any antibiotics over the last year, she is also been nauseated with loss of appetite and decreased oral intake despite this she has continued to take her antihypertensive medication, she does relate that on the day of admission she started having some dysuria, she denies any  urgency frequency hematuria or hematochezia.  She relates some dizziness upon standing she relates she received a Covid vaccine more than 8 months ago. ED Course: Afebrile,Vital signs are stable- saturations are greater than 97%, she was found to be hyponatremic, new acute renal failure with elevated AST and ALT unremarkable, UA  positive for bacteria and hemoglobin large, WBC/RBC>  50.  She underwent a CT renal stone protocol which revealed an underlying malignancy involving the cervix and invading into the posterior bladder wall with obstruction of the right UVJ or distal right ureter with right hydronephrosis.  Urology was consulted and she has since undergone a ureteral stent on the right side due to ureteral obstruction.   She was also evaluated by oncology and underwent cervical biopsy.  Frozen section and formal pathology are both consistent with adenocarcinoma.  She underwent PICC line placement on 04/19/2020.   On 8/19, she underwent first dose of chemo and tolerated well. Later that night she developed respiratory distress due to volume overload from IVF and a CXR showed vascular congestion. IVF were discontinued and she was started on lasix.   She continued to have abdominal findings concerning for ileus however was not very symptomatic. Serial abdominal xrays showed ongoing colonic gaseous distension.  Due to prolonged ileus and no advancement of her diet, she  was started on TPN on 04/24/2020. Gyn onc recommends G-tube placement if she starts to develop nausea or vomiting.

## 2020-04-18 NOTE — Progress Notes (Signed)
START OFF PATHWAY REGIMEN - Other   [id number]:Carboplatin + Paclitaxel (5/175) q21 Days:   A cycle is every 21 days:     Paclitaxel      Carboplatin   **Always confirm dose/schedule in your pharmacy ordering system**  Patient Characteristics: Intent of Therapy: Non-Curative / Palliative Intent, Discussed with Patient 

## 2020-04-19 ENCOUNTER — Other Ambulatory Visit: Payer: Self-pay | Admitting: Hematology and Oncology

## 2020-04-19 DIAGNOSIS — C800 Disseminated malignant neoplasm, unspecified: Secondary | ICD-10-CM

## 2020-04-19 LAB — CBC WITH DIFFERENTIAL/PLATELET
Abs Immature Granulocytes: 0.06 10*3/uL (ref 0.00–0.07)
Basophils Absolute: 0.1 10*3/uL (ref 0.0–0.1)
Basophils Relative: 1 %
Eosinophils Absolute: 0 10*3/uL (ref 0.0–0.5)
Eosinophils Relative: 0 %
HCT: 38.2 % (ref 36.0–46.0)
Hemoglobin: 12.8 g/dL (ref 12.0–15.0)
Immature Granulocytes: 1 %
Lymphocytes Relative: 9 %
Lymphs Abs: 0.8 10*3/uL (ref 0.7–4.0)
MCH: 27.9 pg (ref 26.0–34.0)
MCHC: 33.5 g/dL (ref 30.0–36.0)
MCV: 83.4 fL (ref 80.0–100.0)
Monocytes Absolute: 1 10*3/uL (ref 0.1–1.0)
Monocytes Relative: 12 %
Neutro Abs: 6.9 10*3/uL (ref 1.7–7.7)
Neutrophils Relative %: 77 %
Platelets: 349 10*3/uL (ref 150–400)
RBC: 4.58 MIL/uL (ref 3.87–5.11)
RDW: 13.8 % (ref 11.5–15.5)
WBC: 8.9 10*3/uL (ref 4.0–10.5)
nRBC: 0 % (ref 0.0–0.2)

## 2020-04-19 LAB — COMPREHENSIVE METABOLIC PANEL
ALT: 12 U/L (ref 0–44)
AST: 16 U/L (ref 15–41)
Albumin: 2.6 g/dL — ABNORMAL LOW (ref 3.5–5.0)
Alkaline Phosphatase: 50 U/L (ref 38–126)
Anion gap: 13 (ref 5–15)
BUN: 19 mg/dL (ref 8–23)
CO2: 15 mmol/L — ABNORMAL LOW (ref 22–32)
Calcium: 8.4 mg/dL — ABNORMAL LOW (ref 8.9–10.3)
Chloride: 103 mmol/L (ref 98–111)
Creatinine, Ser: 1.32 mg/dL — ABNORMAL HIGH (ref 0.44–1.00)
GFR calc Af Amer: 49 mL/min — ABNORMAL LOW (ref >60)
GFR calc non Af Amer: 42 mL/min — ABNORMAL LOW (ref >60)
Glucose, Bld: 95 mg/dL (ref 70–99)
Potassium: 3.9 mmol/L (ref 3.5–5.1)
Sodium: 131 mmol/L — ABNORMAL LOW (ref 135–145)
Total Bilirubin: 0.8 mg/dL (ref 0.3–1.2)
Total Protein: 5.5 g/dL — ABNORMAL LOW (ref 6.5–8.1)

## 2020-04-19 LAB — MAGNESIUM: Magnesium: 1.6 mg/dL — ABNORMAL LOW (ref 1.7–2.4)

## 2020-04-19 LAB — SURGICAL PATHOLOGY

## 2020-04-19 LAB — PREALBUMIN: Prealbumin: 8.1 mg/dL — ABNORMAL LOW (ref 18–38)

## 2020-04-19 MED ORDER — LACTATED RINGERS IV SOLN: INTRAVENOUS | Status: DC

## 2020-04-19 MED ORDER — DEXAMETHASONE SODIUM PHOSPHATE 4 MG/ML IJ SOLN
10.0000 mg | Freq: Once | INTRAMUSCULAR | Status: AC
  Administered 2020-04-20: 10 mg via INTRAVENOUS
  Filled 2020-04-19: qty 3

## 2020-04-19 MED ORDER — DEXAMETHASONE SODIUM PHOSPHATE 4 MG/ML IJ SOLN
10.0000 mg | Freq: Once | INTRAMUSCULAR | Status: AC
  Administered 2020-04-19: 10 mg via INTRAVENOUS
  Filled 2020-04-19: qty 3

## 2020-04-19 MED ORDER — CHLORHEXIDINE GLUCONATE CLOTH 2 % EX PADS
6.0000 | MEDICATED_PAD | Freq: Every day | CUTANEOUS | Status: DC
  Administered 2020-04-19 – 2020-05-11 (×23): 6 via TOPICAL

## 2020-04-19 MED ORDER — SODIUM CHLORIDE 0.9% FLUSH
10.0000 mL | Freq: Two times a day (BID) | INTRAVENOUS | Status: DC
  Administered 2020-04-19: 20 mL
  Administered 2020-04-19 – 2020-05-11 (×27): 10 mL

## 2020-04-19 MED ORDER — SODIUM CHLORIDE 0.9% FLUSH
10.0000 mL | INTRAVENOUS | Status: DC | PRN
  Administered 2020-04-24 – 2020-05-11 (×4): 10 mL

## 2020-04-19 NOTE — Progress Notes (Signed)
Peripherally Inserted Central Catheter Placement  The IV Nurse has discussed with the patient and/or persons authorized to consent for the patient, the purpose of this procedure and the potential benefits and risks involved with this procedure.  The benefits include less needle sticks, lab draws from the catheter, and the patient may be discharged home with the catheter. Risks include, but not limited to, infection, bleeding, blood clot (thrombus formation), and puncture of an artery; nerve damage and irregular heartbeat and possibility to perform a PICC exchange if needed/ordered by physician.  Alternatives to this procedure were also discussed.  Bard Power PICC patient education guide, fact sheet on infection prevention and patient information card has been provided to patient /or left at bedside.    PICC Placement Documentation  PICC Double Lumen 61/51/83 PICC Right Basilic 28 cm 0 cm (Active)  Indication for Insertion or Continuance of Line Prolonged intravenous therapies 04/19/20 1004  Exposed Catheter (cm) 0 cm 04/19/20 1004  Site Assessment Clean;Dry;Intact 04/19/20 1004  Lumen #1 Status Flushed;Blood return noted;Saline locked 04/19/20 1004  Lumen #2 Status Flushed;Blood return noted;Saline locked 04/19/20 1004  Dressing Type Transparent;Securing device 04/19/20 1004  Dressing Status Clean;Dry;Intact;Antimicrobial disc in place 04/19/20 1004  Dressing Intervention Other (Comment) 04/19/20 1004  Dressing Change Due 04/26/20 04/19/20 1004       Frances Maywood 04/19/2020, 10:07 AM

## 2020-04-19 NOTE — Progress Notes (Signed)
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, biopsy pending Final pathology from this cervical biopsy pending However frozen section from cervical biopsy consistent with adenocarcinoma -we will await final pathology CA-125 is elevated at 208 Patient has been seen by GYN oncology is not a surgical candidate; they have recommended systemic therapy I reviewed the plan of care with the patient and her son yesterday The preliminary findings are indicated of stage IV cervical cancer although results are not fully available She will need inpatient chemotherapy given significant findings of carcinomatosis Without chemotherapy, there is no hope that the carcinomatosis will resolve Given her chronic renal failure, and her frail status right now, it is not likely she can tolerate cisplatin-based treatment I will plan on giving her first dose inpatient carboplatin and paclitaxel on Thursday, assuming we will get final results of her biopsy today I will get inpatient nurses to give her chemo education class The patient is told she has stage IV disease that treatment goal is strictly palliative in nature  Hyponatremia Appears to be due to to hypovolemia from poor p.o. intake and hydrochlorothiazide use Sodium level improving with IV hydration and discontinuation of hydrochlorothiazide Monitor closely  AKI due to hydronephrosis Due to dehydration/poor p.o. intake HCTZ and lisinopril placed on hold Status post ureteral stent Creatinine slowly improving with hydration  Protein calorie malnutrition Recommend dietitian consult  Abdominal carcinomatosis, tinkling bowel sounds, nausea, imminent bowel obstruction I discussed this with GYN surgeon Her diet is changed to liquid only We will monitor closely If she has another bout of episode of  nausea/vomiting, I recommend abdominal imaging to rule out bowel obstruction and if she have signs of bowel obstruction, I would recommend placement of NG tube Her exam findings are stable compared to yesterday  Hypertension Off lisinopril and hydrochlorothiazide secondary to AKI Hydralazine ordered as needed for systolic blood pressure greater than 160 Further management per hospitalist  CAD status post PTCA On metoprolol; aspirin on hold  Possible UTI versus pneumonia versus compression atelectasis She is on IV antibiotics Even though she had poor venous access, I do not recommend port placement right now If she needs additional IV access, I recommend placement of PICC line  Goals of care discussion She is aware she have stage IV disease Treatment goal is palliative The goal for treatment in this hospital stay is to resolve her bowel obstruction prior to discharge  Discharge planning She will likely be here for the next 3 to 5 days All questions were answered. The patient knows to call the clinic with any problems, questions or concerns.   [name], MD 04/19/2020 7:39 AM  Subjective:  She tolerated liquid diet well.  Denies further nausea or vomiting.  Has passage of flatus but no bowel movement She denies abdominal pain She had some wheezes this morning and was placed on oxygen and breathing treatment.  Objective:  Vitals:   04/18/20 1918 04/19/20 0540  BP: (!) 158/103 (!) 150/91  Pulse: (!) 101 95  Resp: 20 20  Temp: 98.9 F (37.2 C) 98.5 F (36.9 C)  SpO2: 95% 95%     Intake/Output Summary (Last 24 hours) at 04/19/2020 0739 Last data filed at 04/18/2020 1005 Gross per 24 hour  Intake 120 ml  Output --  Net 120 ml    GENERAL:alert, no distress and comfortable SKIN: skin color, texture,  turgor are normal, no rashes or significant lesions EYES: normal, Conjunctiva are pink and non-injected, sclera clear OROPHARYNX:no exudate, no erythema and lips, buccal  mucosa, and tongue normal  NECK: supple, thyroid normal size, non-tender, without nodularity LYMPH:  no palpable lymphadenopathy in the cervical, axillary or inguinal LUNGS: clear to auscultation and percussion with normal breathing effort.  I do not appreciate any wheezes HEART: regular rate & rhythm and no murmurs and no lower extremity edema ABDOMEN:abdomen soft, distended with ascites.  Hypoactive bowel sounds throughout except on the left upper quadrant with tinkling bowel sounds Musculoskeletal:no cyanosis of digits and no clubbing  NEURO: alert & oriented x 3 with fluent speech, no focal motor/sensory deficits   Labs:  Recent Labs    04/13/20 1330 04/13/20 1829 04/17/20 0529 04/18/20 0607 04/19/20 0608  NA 123*   < > 130* 129* 131*  K 4.1   < > 3.9 3.8 3.9  CL 93*   < > 101 104 103  CO2 19*   < > 15* 15* 15*  GLUCOSE 102*   < > 90 95 95  BUN 19   < > 14 18 19   CREATININE 1.51*   < > 1.40* 1.30* 1.32*  CALCIUM 8.8*   < > 8.6* 8.4* 8.4*  GFRNONAA 36*   < > 39* 43* 42*  GFRAA 41*   < > 45* 50* 49*  PROT 7.1  --   --   --  5.5*  ALBUMIN 3.4*  --   --   --  2.6*  AST 14*  --   --   --  16  ALT 12  --   --   --  12  ALKPHOS 65  --   --   --  50  BILITOT 0.7  --   --   --  0.8   < > = values in this interval not displayed.    Studies:  DG Chest 2 View  Result Date: 04/13/2020 CLINICAL DATA:  Weakness diarrhea EXAM: CHEST - 2 VIEW COMPARISON:  07/28/2004 FINDINGS: Heart size upper normal.  Vascularity normal.  Coronary stent noted. Small bilateral pleural effusions. Mild bibasilar atelectasis/infiltrate. IMPRESSION: Interval development of mild bibasilar airspace disease and small pleural effusions. Negative for heart failure. Electronically Signed   By: Franchot Gallo M.D.   On: 04/13/2020 13:57   CT CHEST W CONTRAST  Result Date: 04/15/2020 CLINICAL DATA:  Cancer of unknown primary, staging. EXAM: CT CHEST WITH CONTRAST TECHNIQUE: Multidetector CT imaging of the chest was  performed during intravenous contrast administration. CONTRAST:  81mL OMNIPAQUE IOHEXOL 300 MG/ML  SOLN COMPARISON:  None. FINDINGS: Cardiovascular: There is mild calcification of the aortic arch. Normal heart size. No pericardial effusion. A coronary artery stent is seen. Mediastinum/Nodes: No enlarged mediastinal, hilar, or axillary lymph nodes. The thyroid gland and trachea demonstrate no significant findings. There is a small hiatal hernia. Lungs/Pleura: Mild bilateral lower lobe atelectasis is seen, right slightly greater than left. Very mild slightly nodular appearing scarring and/or atelectasis is seen along the posterior aspect of the inferior left upper lobe. There is a small left pleural effusion. A moderate sized right pleural effusion is also noted. No pneumothorax is identified. Upper Abdomen: A moderate amount of abdominal free fluid is seen. There is moderate to marked severity right-sided hydronephrosis with delayed renal cortical enhancement involving the right kidney. Musculoskeletal: No chest wall abnormality. No acute or significant osseous findings. Degenerative changes seen throughout the thoracic spine. IMPRESSION: 1. Small  left pleural effusion with a moderate sized right pleural effusion. 2. Mild bilateral lower lobe atelectasis, right slightly greater than left. 3. Very mild slightly nodular appearing scarring and/or atelectasis along the posterior aspect of the inferior left upper lobe. 4. Moderate amount of abdominal free fluid. 5. Moderate to marked severity right-sided hydronephrosis with delayed renal cortical enhancement involving the right kidney. This is suggestive of partial renal obstruction. 6. Small hiatal hernia. 7. Aortic atherosclerosis. Aortic Atherosclerosis (ICD10-I70.0). Electronically Signed   By: Virgina Norfolk M.D.   On: 04/15/2020 15:38   DG C-Arm 1-60 Min-No Report  Result Date: 04/15/2020 Fluoroscopy was utilized by the requesting physician.  No radiographic  interpretation.   CT RENAL STONE STUDY  Result Date: 04/14/2020 CLINICAL DATA:  64 year old female with hematuria. EXAM: CT ABDOMEN AND PELVIS WITHOUT CONTRAST TECHNIQUE: Multidetector CT imaging of the abdomen and pelvis was performed following the standard protocol without IV contrast. COMPARISON:  None. FINDINGS: Evaluation of this exam is limited in the absence of intravenous contrast. Lower chest: Partially visualized moderate right and small left pleural effusions. There is associated partial compressive atelectasis of the lower lobes. Pneumonia is not excluded. Clinical correlation is recommended. There is coronary vascular calcification. Partially visualized trace pericardial effusion. There is no intra-abdominal free air. Small ascites. Hepatobiliary: No focal liver abnormality is seen. No gallstones, gallbladder wall thickening, or biliary dilatation. Pancreas: Unremarkable. No pancreatic ductal dilatation or surrounding inflammatory changes. Spleen: Normal in size without focal abnormality. Adrenals/Urinary Tract: The adrenal glands unremarkable. The left kidney is unremarkable. There is a moderate right hydronephrosis with mild right hydroureter. No stone identified. There is a transition point in the distal right ureter or right UVJ. The urinary bladder is minimally distended. There is possible mild nodular thickening of the posterior bladder wall adjacent to the right UVJ (77/2). There is loss of fat plane between the anterior lower uterus/cervix and posterior bladder wall. The lower uterus/cervical region is somewhat enlarged. Findings concerning for a neoplastic process either arising from the bladder urothelium or from the cervix and invading into the posterior bladder wall with obstruction of the right UVJ or distal right ureter. Stomach/Bowel: There is a small hiatal hernia. There is no bowel obstruction. There is sigmoid diverticulosis. The appendix is not visualized with certainty. No  inflammatory changes identified in the right lower quadrant. Vascular/Lymphatic: Moderate aortoiliac atherosclerotic disease. The IVC is unremarkable. No portal venous gas. There is no adenopathy. Reproductive: The uterus is anteverted. Enlargement of the lower uterus/cervical region as described above. Other: There is diffuse omental nodularity and caking consistent with metastatic disease. Musculoskeletal: Osteopenia. No acute osseous pathology. IMPRESSION: 1. Findings concerning for a neoplastic process either arising from the bladder urothelium or from the cervix and invading into the posterior bladder wall with obstruction of the right UVJ or distal right ureter. There is associated moderate right hydronephrosis. Sampling of the ascitic fluid may provide further diagnostic information. 2. Diffuse omental nodularity and caking consistent with metastatic disease. 3. Moderate right and small left pleural effusions with associated partial compressive atelectasis of the lower lobes. Pneumonia is not excluded. Clinical correlation is recommended. 4. Sigmoid diverticulosis. No bowel obstruction. 5. Aortic Atherosclerosis (ICD10-I70.0). Electronically Signed   By: Anner Crete M.D.   On: 04/14/2020 17:44   [place] EKG SITE RITE  Result Date: 04/18/2020 If Site Rite image not attached, placement could not be confirmed due to current cardiac rhythm.

## 2020-04-19 NOTE — Progress Notes (Signed)
GYN Oncology Follow Up  Patient resting in bed in no acute distress.  States she is fatigued after working with PT.  No pain reported at this time but has had intermittent back pain.  Tolerating clear liquids today with no nausea. Last episode of emesis was 04/18/2020. Passed small amount of flatus today. No BM. Reports feelings of abdominal distention are off and on. Abdomen remains tympanic but less than 8/17 assessment.  Discussed biopsy results with patient. No questions voiced.  She is for initiation of chemotherapy tomorrow. Her PICC line has been placed. Kpad ordered for intermittent back discomfort. Continue plan of care per Hospitalist Team, Dr Alvy Bimler.

## 2020-04-19 NOTE — Progress Notes (Signed)
Physical Therapy Treatment Patient Details Name: Eileen Burgess MRN: [id number] DOB: 11/26/1955 Today's Date: 04/19/2020    History of Present Illness 64 yo female admitted with UTI. Imaging (+) pelvic mass. S/P R ureteral stent placement, biopsy 8/14. Hx of CAD,MI, obesity    PT Comments    Patient c/o back pain and fatigue at start of session. Pt agreeable to to mobilize in room and go to bathroom, min guard/supervision for gait with IV pole. Pt continued with bil UE support despite cues from PT to progress to single UE support. Patient strongly declined to mobilize further in hallway to increase activity and requested to go back to bed. Pt saturating 94-96% on RA with gait and decreased to 1L/min to wean at rest. RN notified. Educated on importance of increasing activity to maintain strength/mobility. Acute PT will continue to progress as able.      Follow Up Recommendations  Supervision for mobility/OOB     Equipment Recommendations  Other (comment) (TBA)    Recommendations for Other Services       Precautions / Restrictions Precautions Precautions: Fall Restrictions Weight Bearing Restrictions: No    Mobility  Bed Mobility Overal bed mobility: Needs Assistance Bed Mobility: Sit to Supine       Sit to supine: Supervision;HOB elevated   General bed mobility comments: pt required extra time to raise LE's into bed  Transfers Overall transfer level: Needs assistance Equipment used: None Transfers: Sit to/from Stand Sit to Stand: Min guard;Supervision         General transfer comment: min guard for safety, pt steady and no sign of LOB. pt performed from recliner and commode.  Ambulation/Gait Ambulation/Gait assistance: Min guard Gait Distance (Feet): 20 Feet (2x10) Assistive device: IV Pole Gait Pattern/deviations: Step-through pattern;Decreased stride length Gait velocity: decr   General Gait Details: pt slow gait and bil UE support on IV pole to steady.  Cues for management of IV pole to use singel UE, pt did not follow. SpO2 remained at 94% or greater on RA. HR in 110's.   Stairs        Wheelchair Mobility    Modified Rankin (Stroke Patients Only)       Balance Overall balance assessment: Needs assistance Sitting-balance support: Feet supported Sitting balance-Leahy Scale: Good     Standing balance support: During functional activity;Bilateral upper extremity supported Standing balance-Leahy Scale: Fair       Cognition Arousal/Alertness: Awake/alert Behavior During Therapy: WFL for tasks assessed/performed Overall Cognitive Status: Within Functional Limits for tasks assessed         Exercises      General Comments        Pertinent Vitals/Pain Pain Assessment: Faces Faces Pain Scale: Hurts a little bit Pain Location: back Pain Descriptors / Indicators: Discomfort;Grimacing Pain Intervention(s): Limited activity within patient's tolerance;Monitored during session;Repositioned           PT Goals (current goals can now be found in the care plan section) Acute Rehab PT Goals Patient Stated Goal: "good news" PT Goal Formulation: With patient Time For Goal Achievement: 04/30/20 Potential to Achieve Goals: Good Progress towards PT goals: Progressing toward goals (slowly, pt self limiting secondary to fatigue and pain)    Frequency    Min 3X/week      PT Plan Current plan remains appropriate       AM-PAC PT "6 Clicks" Mobility   Outcome Measure  Help needed turning from your back to your side while in a flat bed without  using bedrails?: A Little Help needed moving from lying on your back to sitting on the side of a flat bed without using bedrails?: A Little Help needed moving to and from a bed to a chair (including a wheelchair)?: A Little Help needed standing up from a chair using your arms (e.g., wheelchair or bedside chair)?: A Little Help needed to walk in hospital room?: A Little Help needed  climbing 3-5 steps with a railing? : A Little 6 Click Score: 18    End of Session Equipment Utilized During Treatment: Gait belt Activity Tolerance: Patient limited by fatigue Patient left: in bed;with call bell/phone within reach Nurse Communication: Mobility status PT Visit Diagnosis: Muscle weakness (generalized) (M62.81);Unsteadiness on feet (R26.81)     Time: 9373-4287 PT Time Calculation (min) (ACUTE ONLY): 15 min  Charges:  $Gait Training: 8-22 mins                     Verner Mould, DPT Acute Rehabilitation Services  Office [phone] Pager [phone]  04/19/2020 2:31 PM

## 2020-04-19 NOTE — Progress Notes (Signed)
Patient ID: Eileen Burgess, female   DOB: 28-Dec-1955, 64 y.o.   MRN: [id number]  4 Days Post-Op Subjective: Tolerating ureteral stent well.  Cervical biopsy positive for adenocarcinoma.  Objective: Vital signs in last 24 hours: Temp:  [98.2 F (36.8 C)-98.9 F (37.2 C)] 98.2 F (36.8 C) (08/18 1418) Pulse Rate:  [90-101] 90 (08/18 1418) Resp:  [18-20] 18 (08/18 1418) BP: (144-158)/(91-103) 144/96 (08/18 1418) SpO2:  [94 %-95 %] 94 % (08/18 1418)  Intake/Output from previous day: 08/17 0701 - 08/18 0700 In: 120 [P.O.:120] Out: -  Intake/Output this shift: No intake/output data recorded.  Physical Exam:  General: Alert and oriented  Lab Results: Recent Labs    04/17/20 0529 04/18/20 0607 04/19/20 0608  HGB 13.0 12.5 12.8  HCT 39.5 37.0 38.2   BMET Recent Labs    04/18/20 0607 04/19/20 0608  NA 129* 131*  K 3.8 3.9  CL 104 103  CO2 15* 15*  GLUCOSE 95 95  BUN 18 19  CREATININE 1.30* 1.32*  CALCIUM 8.4* 8.4*     Studies/Results:   Assessment/Plan: Ureteral obstruction: S/P ureteral stent.  Will arrange outpatient follow up in about 8-10 weeks to assess plan for ongoing stent management.  Bladder biopsies not yet final but suspect this will demonstrate findings consistent with locally advanced cervical primary.    LOS: 5 days   Eileen Burgess 04/19/2020, 5:06 PM

## 2020-04-19 NOTE — Progress Notes (Signed)
Initial Nutrition Assessment  DOCUMENTATION CODES:   Obesity unspecified  INTERVENTION:   -Recommend schedule doses of Zofran to prevent nausea prior to meals -Once tolerating clears better, will order Boost Breeze po TID, each supplement provides 250 kcal and 9 grams of protein -Will place diet information in discharge instructions  NUTRITION DIAGNOSIS:   Increased nutrient needs related to cancer and cancer related treatments as evidenced by estimated needs.  GOAL:   Patient will meet greater than or equal to 90% of their needs  MONITOR:   PO intake, Supplement acceptance, Weight trends, I & O's, Skin, Labs  REASON FOR ASSESSMENT:   Consult Assessment of nutrition requirement/status  ASSESSMENT:   64 y.o. female past medical history of CAD status post angioplasty to the LAD, essential hypertension history of tobacco abuse she is currently non-smoking, GERD that comes into the hospital for 3 weeks of episodic diarrhea that has progressively gotten worse, she has not received any antibiotics over the last year, she is also been nauseated with loss of appetite and decreased oral intake despite this she has continued to take her antihypertensive medication, she does relate that on the day of admission she started having some dysuria  8/12: admitted  Patient continues to have issues tolerating PO. Pt was having episodes of diarrhea, nausea and poor appetite/PO for 3 weeks PTA. Pt has been on and off a diet this admission. Now on clears since N/V started on 8/15. Pt states she would even vomit up broth and jello. States she prefers the broth from the unit rather than the kitchen, flavor-wise. Pt reports she now has nausea and reflux after sipping on broth.   Per weight records, no weight loss PTA.   Medications: Miralax, Senokot, Lactated ringers Labs reviewed: Low Na, Mg  NUTRITION - FOCUSED PHYSICAL EXAM:  No depletions noted.  Diet Order:   Diet Order            Diet  clear liquid Room service appropriate? Yes; Fluid consistency: Thin  Diet effective now                 EDUCATION NEEDS:   Not appropriate for education at this time  Skin:  Skin Assessment: Reviewed RN Assessment  Last BM:  8/14  Height:   Ht Readings from Last 1 Encounters:  04/13/20 5\' 4"  (1.626 m)    Weight:   Wt Readings from Last 1 Encounters:  04/13/20 98 kg    BMI:  Body mass index is 37.09 kg/m.  Estimated Nutritional Needs:   Kcal:  1650-1850  Protein:  65-80g  Fluid:  1.8L/day  Clayton Bibles, MS, RD, LDN Inpatient Clinical Dietitian Contact information available via Amion

## 2020-04-19 NOTE — Progress Notes (Signed)
PROGRESS NOTE    Eileen Burgess   VQM:[id number]  DOB: [birth year]  DOA: 04/13/2020     5  PCP: Patient, No Pcp Per  CC: diarrhea  Hospital Course: Ms. Eileen Burgess is a 64 y.o. female with PMH CAD s/p angioplasty to the LAD, hypertension, history of tobacco abuse she is currently non-smoking, GERD that came to the hospital due to 3 weeks of episodic diarrhea that has progressively gotten worse, she has not received any antibiotics over the last year, she is also been nauseated with loss of appetite and decreased oral intake despite this she has continued to take her antihypertensive medication, she does relate that on the day of admission she started having some dysuria, she denies any  urgency frequency hematuria or hematochezia.  She relates some dizziness upon standing she relates she received a Covid vaccine more than 8 months ago.  ED Course: Afebrile,Vital signs are stable- saturations are greater than 97%, she was found to be hyponatremic, new acute renal failure with elevated AST and ALT unremarkable, UA  positive for bacteria and hemoglobin large, WBC/RBC>  50.  She underwent a CT renal stone protocol which revealed an underlying malignancy involving the cervix and invading into the posterior bladder wall with obstruction of the right UVJ or distal right ureter with right hydronephrosis.  Urology was consulted and she has since undergone a ureteral stent on the right side due to ureteral obstruction.   She was also evaluated by oncology and underwent cervical biopsy.  Frozen section and formal pathology are both consistent with adenocarcinoma.  She underwent PICC line placement on 04/19/2020.    Interval History:  No events overnight.  Resting in bed in no distress this morning.  PICC line now in place successfully.  Old records reviewed in assessment of this patient  ROS: Constitutional: positive for fatigue, Respiratory: negative for cough, Cardiovascular: negative for chest pain and  Gastrointestinal: positive for abdominal pain  Assessment & Plan: Abdominal pain with dysuria, AKI, right hydronephrosis,  Metastatic omental carcinomatosis.  Cervical cancer- new diagnosis -CT abdomen pelvis showed possible neoplastic processes arising from the bladder urothelium or from cervix and invading into the posterior bladder wall with obstruction of right UVJ or distal right ureter, moderate right hydronephrosis, diffuse omental nodularity and caking consistent with metastatic disease. Moderate right and small left pleural effusions, associated partial compressive atelectasis of lower lobes.  Discussed with the patient, no prior history of malignancy (just had a history of ovarian cyst at the age of 25 years). -Urology consulted, s/p right ureteral stent placement on 8/14.  Biopsy of the posterior bladder with intraoperative findings suggest extrinsic mass invading bladder rather than primary bladder malignancy.   -CT chest with contrast showed small pleural effusion left, moderate sized right pleural effusion, moderate amount of abdominal free fluid, moderate to marked severe right-sided hydronephrosis.  No lung lesions. - frozen section consistent with adenocarcinoma; final path also shows adenocarcinoma from cervical biopsy - per oncology tentative plan is for carboplatin/paclitaxel on Thursday - PICC line placed 04/19/20 -Having some flatus but still no bowel movement.  Still monitoring for signs or symptoms of developing obstruction versus ileus  Hyponatremia, dehydration -Appears to be hypovolemic related to recent diarrhea and poor p.o. intake, HCTZ use vs SIADH from ?malignancy.  Serum osmolality 268, urine osmolality 617, urine sodium 50 -monitor on IVF  Acute on CKDIII - baseline creat appears to be 1.1, but GFR in 2018 mid 50s -In the setting of #1, dehydration, HCTZ  use -Continue to hold lisinopril, HCTZ -Status post ureteral stent -Creatinine improving; continue  following  Hypertension -BP stable, continue metoprolol  - continue metoprolol, avoid diuretics, ACE inhibitor  CAD status post PTCA Continue metoprolol, aspirin  GERD Continue PPI  Obesity Estimated body mass index is 37.09 kg/m as calculated from the following:   Height as of this encounter: 5\' 4"  (1.626 m).   Weight as of this encounter: 98 kg.  Antimicrobials: none  DVT prophylaxis: HSQ Code Status: Full Family Communication: none present Disposition Plan:  Status is: Inpatient  Remains inpatient appropriate because:Unsafe d/c plan, IV treatments appropriate due to intensity of illness or inability to take PO and Inpatient level of care appropriate due to severity of illness   Dispo: The patient is from: Home              Anticipated d/c is to: Home              Anticipated d/c date is: 3 days              Patient currently is not medically stable to d/c.  Objective: Blood pressure (!) 144/96, pulse 90, temperature 98.2 F (36.8 C), temperature source Oral, resp. rate 18, height 5\' 4"  (1.626 m), weight 98 kg, SpO2 94 %.  Examination: General appearance: alert, cooperative, fatigued and no distress Head: Normocephalic, without obvious abnormality, atraumatic Eyes: EOMI Lungs: clear to auscultation bilaterally Heart: regular rate and rhythm and S1, S2 normal Abdomen: soft, ND, BS hypoactive Extremities: no UE edema Skin: mobility and turgor normal Neurologic: Grossly normal  Consultants:   Gyn oncology  Medical oncology  Data Reviewed: I have personally reviewed following labs and imaging studies Results for orders placed or performed during the hospital encounter of 04/13/20 (from the past 24 hour(s))  Prealbumin     Status: Abnormal   Collection Time: 04/19/20  6:08 AM  Result Value Ref Range   Prealbumin 8.1 (L) 18 - 38 mg/dL  CBC with Differential/Platelet     Status: None   Collection Time: 04/19/20  6:08 AM  Result Value Ref Range   WBC 8.9  4.0 - 10.5 K/uL   RBC 4.58 3.87 - 5.11 MIL/uL   Hemoglobin 12.8 12.0 - 15.0 g/dL   HCT 38.2 36 - 46 %   MCV 83.4 80.0 - 100.0 fL   MCH 27.9 26.0 - 34.0 pg   MCHC 33.5 30.0 - 36.0 g/dL   RDW 13.8 11.5 - 15.5 %   Platelets 349 150 - 400 K/uL   nRBC 0.0 0.0 - 0.2 %   Neutrophils Relative % 77 %   Neutro Abs 6.9 1.7 - 7.7 K/uL   Lymphocytes Relative 9 %   Lymphs Abs 0.8 0.7 - 4.0 K/uL   Monocytes Relative 12 %   Monocytes Absolute 1.0 0 - 1 K/uL   Eosinophils Relative 0 %   Eosinophils Absolute 0.0 0 - 0 K/uL   Basophils Relative 1 %   Basophils Absolute 0.1 0 - 0 K/uL   Immature Granulocytes 1 %   Abs Immature Granulocytes 0.06 0.00 - 0.07 K/uL  Comprehensive metabolic panel     Status: Abnormal   Collection Time: 04/19/20  6:08 AM  Result Value Ref Range   Sodium 131 (L) 135 - 145 mmol/L   Potassium 3.9 3.5 - 5.1 mmol/L   Chloride 103 98 - 111 mmol/L   CO2 15 (L) 22 - 32 mmol/L   Glucose, Bld 95 70 - 99  mg/dL   BUN 19 8 - 23 mg/dL   Creatinine, Ser 1.32 (H) 0.44 - 1.00 mg/dL   Calcium 8.4 (L) 8.9 - 10.3 mg/dL   Total Protein 5.5 (L) 6.5 - 8.1 g/dL   Albumin 2.6 (L) 3.5 - 5.0 g/dL   AST 16 15 - 41 U/L   ALT 12 0 - 44 U/L   Alkaline Phosphatase 50 38 - 126 U/L   Total Bilirubin 0.8 0.3 - 1.2 mg/dL   GFR calc non Af Amer 42 (L) >60 mL/min   GFR calc Af Amer 49 (L) >60 mL/min   Anion gap 13 5 - 15  Magnesium     Status: Abnormal   Collection Time: 04/19/20  6:08 AM  Result Value Ref Range   Magnesium 1.6 (L) 1.7 - 2.4 mg/dL    Recent Results (from the past 240 hour(s))  Urine culture     Status: Abnormal   Collection Time: 04/13/20  1:20 PM   Specimen: Urine, Random  Result Value Ref Range Status   Specimen Description   Final    URINE, RANDOM Performed at [facility], [place] [address], [place], [place] [address]    Special Requests   Final    NONE Performed at [facility], [place] [address] [place], [place] [address]     Culture (A)  Final    20,000 COLONIES/mL MULTIPLE SPECIES PRESENT, SUGGEST RECOLLECTION   Report Status 04/14/2020 FINAL  Final  SARS Coronavirus 2 by RT PCR (hospital order, performed in [facility] hospital lab) Nasopharyngeal Nasopharyngeal Swab     Status: None   Collection Time: 04/13/20  2:13 PM   Specimen: Nasopharyngeal Swab  Result Value Ref Range Status   SARS Coronavirus 2 NEGATIVE NEGATIVE Final    Comment: (NOTE) SARS-CoV-2 target nucleic acids are NOT DETECTED.  The SARS-CoV-2 RNA is generally detectable in upper and lower respiratory specimens during the acute phase of infection. The lowest concentration of SARS-CoV-2 viral copies this assay can detect is 250 copies / mL. A negative result does not preclude SARS-CoV-2 infection and should not be used as the sole basis for treatment or other patient management decisions.  A negative result may occur with improper specimen collection / handling, submission of specimen other than nasopharyngeal swab, presence of viral mutation(s) within the areas targeted by this assay, and inadequate number of viral copies (<250 copies / mL). A negative result must be combined with clinical observations, patient history, and epidemiological information.  Fact Sheet for Patients:   StrictlyIdeas.no  Fact Sheet for Healthcare Providers: BankingDealers.co.za  This test is not yet approved or  cleared by the [place] FDA and has been authorized for detection and/or diagnosis of SARS-CoV-2 by FDA under an Emergency Use Authorization (EUA).  This EUA will remain in effect (meaning this test can be used) for the duration of the COVID-19 declaration under Section 564(b)(1) of the Act, 21 U.S.C. section 360bbb-3(b)(1), unless the authorization is terminated or revoked sooner.  Performed at [facility], [place] [address] [place], [place] [address]   Culture, blood (Routine  X 2) w Reflex to ID Panel     Status: None   Collection Time: 04/13/20  6:28 PM   Specimen: BLOOD  Result Value Ref Range Status   Specimen Description   Final    BLOOD RIGHT ANTECUBITAL Performed at [facility], [place] [address], [place],  [address]    Special Requests  Final    BOTTLES DRAWN AEROBIC AND ANAEROBIC Blood Culture adequate volume Performed at [place] [address], [place], [place] [address]    Culture   Final    NO GROWTH 5 DAYS Performed at [facility], [place] [address], [place], [place] [address]    Report Status 04/18/2020 FINAL  Final  Culture, blood (Routine X 2) w Reflex to ID Panel     Status: None   Collection Time: 04/13/20  6:40 PM   Specimen: BLOOD RIGHT HAND  Result Value Ref Range Status   Specimen Description   Final    BLOOD RIGHT HAND Performed at [place] [address], [place], [place] [address]    Special Requests   Final    BOTTLES DRAWN AEROBIC AND ANAEROBIC Blood Culture adequate volume Performed at [place] [address], [place], [place] [address]    Culture   Final    NO GROWTH 5 DAYS Performed at [facility], [place] [address], [place], [place] [address]    Report Status 04/18/2020 FINAL  Final  MRSA PCR Screening     Status: None   Collection Time: 04/15/20  3:04 PM   Specimen: Nasal Mucosa; Nasopharyngeal  Result Value Ref Range Status   MRSA by PCR NEGATIVE NEGATIVE Final    Comment:        The GeneXpert MRSA Assay (FDA approved for NASAL specimens only), is one component of a comprehensive MRSA colonization surveillance program. It is not intended to diagnose MRSA infection nor to guide or monitor treatment for MRSA infections. Performed at [facility], [place] [address] [place] [address] [place], [place] [address]      Radiology Studies: [place] EKG SITE RITE  Result Date: 04/18/2020 If Site Rite image not attached,  placement could not be confirmed due to current cardiac rhythm.  [place] EKG SITE RITE  Final Result    DG C-Arm 1-60 Min-No Report  Final Result    CT CHEST W CONTRAST  Final Result    CT RENAL STONE STUDY  Final Result    DG Chest 2 View  Final Result       Scheduled Meds: . Chlorhexidine Gluconate Cloth  6 each Topical Daily  . dexamethasone  10 mg Intravenous Once  . [START ON 04/20/2020] dexamethasone  10 mg Intravenous Once  . heparin  5,000 Units Subcutaneous Q8H  . metoprolol succinate  25 mg Oral Daily  . pantoprazole  40 mg Oral Daily  . polyethylene glycol  17 g Oral Once  . senna-docusate  1 tablet Oral QHS  . sodium chloride flush  10-40 mL Intracatheter Q12H   PRN Meds: acetaminophen **OR** acetaminophen, hydrALAZINE, ipratropium-albuterol, nitroGLYCERIN, ondansetron **OR** ondansetron (ZOFRAN) IV, phenol, prochlorperazine, simethicone, sodium chloride flush Continuous Infusions: . lactated ringers 75 mL/hr at 04/19/20 0830      LOS: 5 days  Time spent: Greater than 50% of the 35 minute visit was spent in counseling/coordination of care for the patient as laid out in the A&P.   Dwyane Dee, MD [facility] 04/19/2020, 6:20 PM   Contact via secure chat.  To contact the attending provider between 7A-7P or the covering provider during after hours 7P-7A, please log into the web site [email or web address] and access using universal [place] password for that web site. If you do not have the password, please call the hospital operator.

## 2020-04-19 NOTE — Progress Notes (Signed)
Provided first-time chemotherapy education. Patient binder provided with reading materials on side effects, what to expect, when to notify the doctor, and how to maintain proper nutrition. Patient did not have any questions at this time. Chemotherapy consent signed. First dose of Chemotherapy will be 04/20/2020

## 2020-04-20 ENCOUNTER — Inpatient Hospital Stay (HOSPITAL_COMMUNITY): Payer: Medicare Other

## 2020-04-20 LAB — CBC WITH DIFFERENTIAL/PLATELET
Abs Immature Granulocytes: 0.07 10*3/uL (ref 0.00–0.07)
Basophils Absolute: 0 10*3/uL (ref 0.0–0.1)
Basophils Relative: 0 %
Eosinophils Absolute: 0 10*3/uL (ref 0.0–0.5)
Eosinophils Relative: 0 %
HCT: 36.4 % (ref 36.0–46.0)
Hemoglobin: 12.2 g/dL (ref 12.0–15.0)
Immature Granulocytes: 1 %
Lymphocytes Relative: 5 %
Lymphs Abs: 0.4 10*3/uL — ABNORMAL LOW (ref 0.7–4.0)
MCH: 27.7 pg (ref 26.0–34.0)
MCHC: 33.5 g/dL (ref 30.0–36.0)
MCV: 82.7 fL (ref 80.0–100.0)
Monocytes Absolute: 0.2 10*3/uL (ref 0.1–1.0)
Monocytes Relative: 2 %
Neutro Abs: 7.1 10*3/uL (ref 1.7–7.7)
Neutrophils Relative %: 92 %
Platelets: 305 10*3/uL (ref 150–400)
RBC: 4.4 MIL/uL (ref 3.87–5.11)
RDW: 13.7 % (ref 11.5–15.5)
WBC: 7.7 10*3/uL (ref 4.0–10.5)
nRBC: 0 % (ref 0.0–0.2)

## 2020-04-20 LAB — COMPREHENSIVE METABOLIC PANEL
ALT: 12 U/L (ref 0–44)
AST: 14 U/L — ABNORMAL LOW (ref 15–41)
Albumin: 2.6 g/dL — ABNORMAL LOW (ref 3.5–5.0)
Alkaline Phosphatase: 50 U/L (ref 38–126)
Anion gap: 13 (ref 5–15)
BUN: 22 mg/dL (ref 8–23)
CO2: 17 mmol/L — ABNORMAL LOW (ref 22–32)
Calcium: 8.7 mg/dL — ABNORMAL LOW (ref 8.9–10.3)
Chloride: 100 mmol/L (ref 98–111)
Creatinine, Ser: 1.27 mg/dL — ABNORMAL HIGH (ref 0.44–1.00)
GFR calc Af Amer: 51 mL/min — ABNORMAL LOW (ref >60)
GFR calc non Af Amer: 44 mL/min — ABNORMAL LOW (ref >60)
Glucose, Bld: 121 mg/dL — ABNORMAL HIGH (ref 70–99)
Potassium: 4.6 mmol/L (ref 3.5–5.1)
Sodium: 130 mmol/L — ABNORMAL LOW (ref 135–145)
Total Bilirubin: 0.7 mg/dL (ref 0.3–1.2)
Total Protein: 5.7 g/dL — ABNORMAL LOW (ref 6.5–8.1)

## 2020-04-20 LAB — BLOOD GAS, ARTERIAL
Acid-base deficit: 13.1 mmol/L — ABNORMAL HIGH (ref 0.0–2.0)
Bicarbonate: 11.3 mmol/L — ABNORMAL LOW (ref 20.0–28.0)
O2 Saturation: 91.7 %
Patient temperature: 98.6
pCO2 arterial: 23 mmHg — ABNORMAL LOW (ref 32.0–48.0)
pH, Arterial: 7.314 — ABNORMAL LOW (ref 7.350–7.450)
pO2, Arterial: 71.5 mmHg — ABNORMAL LOW (ref 83.0–108.0)

## 2020-04-20 LAB — SURGICAL PATHOLOGY

## 2020-04-20 LAB — MAGNESIUM: Magnesium: 1.6 mg/dL — ABNORMAL LOW (ref 1.7–2.4)

## 2020-04-20 MED ORDER — SODIUM CHLORIDE 0.9% FLUSH
10.0000 mL | INTRAVENOUS | Status: DC | PRN
  Administered 2020-05-11: 10 mL

## 2020-04-20 MED ORDER — SODIUM CHLORIDE 0.9 % IV SOLN
462.0000 mg | Freq: Once | INTRAVENOUS | Status: AC
  Administered 2020-04-20: 460 mg via INTRAVENOUS
  Filled 2020-04-20: qty 46
  Filled 2020-04-20: qty 45

## 2020-04-20 MED ORDER — LABETALOL HCL 5 MG/ML IV SOLN
10.0000 mg | Freq: Once | INTRAVENOUS | Status: AC
  Administered 2020-04-20: 10 mg via INTRAVENOUS
  Filled 2020-04-20: qty 4

## 2020-04-20 MED ORDER — DIPHENHYDRAMINE HCL 50 MG/ML IJ SOLN
25.0000 mg | Freq: Once | INTRAMUSCULAR | Status: AC
  Administered 2020-04-20: 25 mg via INTRAVENOUS
  Filled 2020-04-20: qty 1

## 2020-04-20 MED ORDER — FAMOTIDINE IN NACL 20-0.9 MG/50ML-% IV SOLN
20.0000 mg | Freq: Once | INTRAVENOUS | Status: AC
  Administered 2020-04-20: 20 mg via INTRAVENOUS
  Filled 2020-04-20: qty 50

## 2020-04-20 MED ORDER — SODIUM CHLORIDE 0.9 % IV SOLN: Freq: Once | INTRAVENOUS | Status: DC

## 2020-04-20 MED ORDER — ALTEPLASE 2 MG IJ SOLR
2.0000 mg | Freq: Once | INTRAMUSCULAR | Status: DC | PRN
  Filled 2020-04-20: qty 2

## 2020-04-20 MED ORDER — SODIUM CHLORIDE 0.9 % IV SOLN
10.0000 mg | Freq: Once | INTRAVENOUS | Status: AC
  Administered 2020-04-20: 10 mg via INTRAVENOUS
  Filled 2020-04-20: qty 1

## 2020-04-20 MED ORDER — HEPARIN SOD (PORK) LOCK FLUSH 100 UNIT/ML IV SOLN
250.0000 [IU] | Freq: Once | INTRAVENOUS | Status: DC | PRN
  Filled 2020-04-20: qty 2.5

## 2020-04-20 MED ORDER — PALONOSETRON HCL INJECTION 0.25 MG/5ML
0.2500 mg | Freq: Once | INTRAVENOUS | Status: AC
  Administered 2020-04-20: 0.25 mg via INTRAVENOUS
  Filled 2020-04-20: qty 5

## 2020-04-20 MED ORDER — COLD PACK MISC ONCOLOGY
1.0000 | Freq: Once | Status: AC | PRN
  Filled 2020-04-20: qty 1

## 2020-04-20 MED ORDER — HEPARIN SOD (PORK) LOCK FLUSH 100 UNIT/ML IV SOLN
500.0000 [IU] | Freq: Once | INTRAVENOUS | Status: DC | PRN
  Filled 2020-04-20: qty 5

## 2020-04-20 MED ORDER — SODIUM CHLORIDE 0.9 % IV SOLN
150.0000 mg | Freq: Once | INTRAVENOUS | Status: AC
  Administered 2020-04-20: 150 mg via INTRAVENOUS
  Filled 2020-04-20: qty 5

## 2020-04-20 MED ORDER — MAGNESIUM OXIDE 400 (241.3 MG) MG PO TABS
800.0000 mg | ORAL_TABLET | Freq: Once | ORAL | Status: AC
  Administered 2020-04-20: 800 mg via ORAL
  Filled 2020-04-20: qty 2

## 2020-04-20 MED ORDER — SODIUM CHLORIDE 0.9% FLUSH: 3.0000 mL | INTRAVENOUS | Status: DC | PRN

## 2020-04-20 MED ORDER — SODIUM CHLORIDE 0.9 % IV SOLN
135.0000 mg/m2 | Freq: Once | INTRAVENOUS | Status: AC
  Administered 2020-04-20: 282 mg via INTRAVENOUS
  Filled 2020-04-20: qty 47

## 2020-04-20 MED ORDER — ALUM & MAG HYDROXIDE-SIMETH 200-200-20 MG/5ML PO SUSP
30.0000 mL | ORAL | Status: DC | PRN
  Administered 2020-04-20 – 2020-04-27 (×3): 30 mL via ORAL
  Filled 2020-04-20 (×3): qty 30

## 2020-04-20 MED ORDER — SODIUM BICARBONATE 650 MG PO TABS
650.0000 mg | ORAL_TABLET | Freq: Two times a day (BID) | ORAL | Status: DC
  Administered 2020-04-20 – 2020-04-26 (×13): 650 mg via ORAL
  Filled 2020-04-20 (×12): qty 1

## 2020-04-20 NOTE — Progress Notes (Signed)
Eileen Burgess   DOB:November 11, 1955   OE#:[id number]    ASSESSMENT & PLAN:   Abnormal imaging finding concerning for metastatic gynecologic cancer, likely stage IV cervical cancer with carcinomatosis, biopsy showed adenocarcinoma As discussed, we will proceed with treatment today I have called pharmacist yesterday and verify dosing She has received chemo education We will proceed with first cycle of carboplatin and paclitaxel today We discussed risk, benefits, side effects of treatment and she is in agreement to proceed  Hyponatremia Appears to be due to to hypovolemia from poor p.o. intake and hydrochlorothiazide use Sodium level improving with IV hydration and discontinuation of hydrochlorothiazide Monitor closely  AKI due to hydronephrosis, improving Due to dehydration/poor p.o. intake HCTZ and lisinopril placed on hold Status post ureteral stent Creatinine slowly improving with hydration  Protein calorie malnutrition Recommend dietitian consult  Abdominal carcinomatosis, tinkling bowel sounds, nausea, imminent bowel obstruction I discussed this with GYN surgeon Her diet is changed to liquid only We will monitor closely Her exam findings are stable compared to yesterday Recommend continuing liquid diet for now Plan to repeat x-ray of the abdomen tomorrow and if no signs of bowel obstruction, we will plan to advance diet  Hypertension Off lisinopril and hydrochlorothiazide secondary to AKI Hydralazine ordered as needed for systolic blood pressure greater than 160 Further management per hospitalist  CAD status post PTCA On metoprolol; aspirin on hold  Possible UTI versus pneumonia versus compression atelectasis She is on IV antibiotics Even though she had poor venous access, I do not recommend port placement right now She had PICC line placement  Goals of care discussion She is aware she have stage IV disease Treatment goal is palliative The goal for treatment in  this hospital stay is to resolve her bowel obstruction prior to discharge  Discharge planning She will likely be here for the next 3 to 5 days All questions were answered. The patient knows to call the clinic with any problems, questions or concerns.   [name], MD 04/20/2020 7:43 AM  Subjective:  She had PICC line placement She continues to have vague abdominal discomfort No bowel movement since last Saturday She has mild intermittent nausea  Objective:  Vitals:   04/19/20 2041 04/20/20 0512  BP: (!) 154/83 (!) 171/86  Pulse: 95 99  Resp: 19 20  Temp: 98.3 F (36.8 C) 97.6 F (36.4 C)  SpO2: 95% 96%     Intake/Output Summary (Last 24 hours) at 04/20/2020 0743 Last data filed at 04/19/2020 2345 Gross per 24 hour  Intake 538.39 ml  Output --  Net 538.39 ml    GENERAL:alert, no distress and comfortable ABDOMEN:abdomen soft, mildly distended with ascites.  Hypoactive bowel sounds noted Musculoskeletal:no cyanosis of digits and no clubbing  NEURO: alert & oriented x 3 with fluent speech, no focal motor/sensory deficits   Labs:  Recent Labs    04/13/20 1330 04/13/20 1829 04/18/20 0607 04/19/20 0608 04/20/20 0507  NA 123*   < > 129* 131* 130*  K 4.1   < > 3.8 3.9 4.6  CL 93*   < > 104 103 100  CO2 19*   < > 15* 15* 17*  GLUCOSE 102*   < > 95 95 121*  BUN 19   < > 18 19 22   CREATININE 1.51*   < > 1.30* 1.32* 1.27*  CALCIUM 8.8*   < > 8.4* 8.4* 8.7*  GFRNONAA 36*   < > 43* 42* 44*  GFRAA 41*   < >  50* 49* 51*  PROT 7.1  --   --  5.5* 5.7*  ALBUMIN 3.4*  --   --  2.6* 2.6*  AST 14*  --   --  16 14*  ALT 12  --   --  12 12  ALKPHOS 65  --   --  50 50  BILITOT 0.7  --   --  0.8 0.7   < > = values in this interval not displayed.    Studies:  DG Chest 2 View  Result Date: 04/13/2020 CLINICAL DATA:  Weakness diarrhea EXAM: CHEST - 2 VIEW COMPARISON:  07/28/2004 FINDINGS: Heart size upper normal.  Vascularity normal.  Coronary stent noted. Small bilateral  pleural effusions. Mild bibasilar atelectasis/infiltrate. IMPRESSION: Interval development of mild bibasilar airspace disease and small pleural effusions. Negative for heart failure. Electronically Signed   By: Franchot Gallo M.D.   On: 04/13/2020 13:57   CT CHEST W CONTRAST  Result Date: 04/15/2020 CLINICAL DATA:  Cancer of unknown primary, staging. EXAM: CT CHEST WITH CONTRAST TECHNIQUE: Multidetector CT imaging of the chest was performed during intravenous contrast administration. CONTRAST:  34mL OMNIPAQUE IOHEXOL 300 MG/ML  SOLN COMPARISON:  None. FINDINGS: Cardiovascular: There is mild calcification of the aortic arch. Normal heart size. No pericardial effusion. A coronary artery stent is seen. Mediastinum/Nodes: No enlarged mediastinal, hilar, or axillary lymph nodes. The thyroid gland and trachea demonstrate no significant findings. There is a small hiatal hernia. Lungs/Pleura: Mild bilateral lower lobe atelectasis is seen, right slightly greater than left. Very mild slightly nodular appearing scarring and/or atelectasis is seen along the posterior aspect of the inferior left upper lobe. There is a small left pleural effusion. A moderate sized right pleural effusion is also noted. No pneumothorax is identified. Upper Abdomen: A moderate amount of abdominal free fluid is seen. There is moderate to marked severity right-sided hydronephrosis with delayed renal cortical enhancement involving the right kidney. Musculoskeletal: No chest wall abnormality. No acute or significant osseous findings. Degenerative changes seen throughout the thoracic spine. IMPRESSION: 1. Small left pleural effusion with a moderate sized right pleural effusion. 2. Mild bilateral lower lobe atelectasis, right slightly greater than left. 3. Very mild slightly nodular appearing scarring and/or atelectasis along the posterior aspect of the inferior left upper lobe. 4. Moderate amount of abdominal free fluid. 5. Moderate to marked  severity right-sided hydronephrosis with delayed renal cortical enhancement involving the right kidney. This is suggestive of partial renal obstruction. 6. Small hiatal hernia. 7. Aortic atherosclerosis. Aortic Atherosclerosis (ICD10-I70.0). Electronically Signed   By: Virgina Norfolk M.D.   On: 04/15/2020 15:38   DG C-Arm 1-60 Min-No Report  Result Date: 04/15/2020 Fluoroscopy was utilized by the requesting physician.  No radiographic interpretation.   CT RENAL STONE STUDY  Result Date: 04/14/2020 CLINICAL DATA:  64 year old female with hematuria. EXAM: CT ABDOMEN AND PELVIS WITHOUT CONTRAST TECHNIQUE: Multidetector CT imaging of the abdomen and pelvis was performed following the standard protocol without IV contrast. COMPARISON:  None. FINDINGS: Evaluation of this exam is limited in the absence of intravenous contrast. Lower chest: Partially visualized moderate right and small left pleural effusions. There is associated partial compressive atelectasis of the lower lobes. Pneumonia is not excluded. Clinical correlation is recommended. There is coronary vascular calcification. Partially visualized trace pericardial effusion. There is no intra-abdominal free air. Small ascites. Hepatobiliary: No focal liver abnormality is seen. No gallstones, gallbladder wall thickening, or biliary dilatation. Pancreas: Unremarkable. No pancreatic ductal dilatation or surrounding inflammatory changes. Spleen:  Normal in size without focal abnormality. Adrenals/Urinary Tract: The adrenal glands unremarkable. The left kidney is unremarkable. There is a moderate right hydronephrosis with mild right hydroureter. No stone identified. There is a transition point in the distal right ureter or right UVJ. The urinary bladder is minimally distended. There is possible mild nodular thickening of the posterior bladder wall adjacent to the right UVJ (77/2). There is loss of fat plane between the anterior lower uterus/cervix and posterior  bladder wall. The lower uterus/cervical region is somewhat enlarged. Findings concerning for a neoplastic process either arising from the bladder urothelium or from the cervix and invading into the posterior bladder wall with obstruction of the right UVJ or distal right ureter. Stomach/Bowel: There is a small hiatal hernia. There is no bowel obstruction. There is sigmoid diverticulosis. The appendix is not visualized with certainty. No inflammatory changes identified in the right lower quadrant. Vascular/Lymphatic: Moderate aortoiliac atherosclerotic disease. The IVC is unremarkable. No portal venous gas. There is no adenopathy. Reproductive: The uterus is anteverted. Enlargement of the lower uterus/cervical region as described above. Other: There is diffuse omental nodularity and caking consistent with metastatic disease. Musculoskeletal: Osteopenia. No acute osseous pathology. IMPRESSION: 1. Findings concerning for a neoplastic process either arising from the bladder urothelium or from the cervix and invading into the posterior bladder wall with obstruction of the right UVJ or distal right ureter. There is associated moderate right hydronephrosis. Sampling of the ascitic fluid may provide further diagnostic information. 2. Diffuse omental nodularity and caking consistent with metastatic disease. 3. Moderate right and small left pleural effusions with associated partial compressive atelectasis of the lower lobes. Pneumonia is not excluded. Clinical correlation is recommended. 4. Sigmoid diverticulosis. No bowel obstruction. 5. Aortic Atherosclerosis (ICD10-I70.0). Electronically Signed   By: Anner Crete M.D.   On: 04/14/2020 17:44   [place] EKG SITE RITE  Result Date: 04/18/2020 If Site Rite image not attached, placement could not be confirmed due to current cardiac rhythm.

## 2020-04-20 NOTE — TOC Initial Note (Signed)
Transition of Care [facility]) - Initial/Assessment Note    Patient Details  Name: Eileen Burgess MRN: [id number] Date of Birth: 1956-05-25  Transition of Care [facility]) CM/SW Contact:    Lynnell Catalan, RN Phone Number: 04/20/2020, 1:28 PM  Clinical Narrative:                 This CM met with pt at bedside for dc planning. Physical therapy recommendations gone over with pt. Pt states that she lives with her son and daughter in law and someone is with her 24hrs a day. Pt states she has a RW and 3in1 at home already.  Expected Discharge Plan: Home/Self Care Barriers to Discharge: Continued Medical Work up  Expected Discharge Plan and Services Expected Discharge Plan: Home/Self Care     Prior Living Arrangements/Services   Lives with:: Adult Children                   Activities of Daily Living Home Assistive Devices/Equipment: None ADL Screening (condition at time of admission) Patient's cognitive ability adequate to safely complete daily activities?: Yes Is the patient deaf or have difficulty hearing?: No Does the patient have difficulty seeing, even when wearing glasses/contacts?: No Does the patient have difficulty concentrating, remembering, or making decisions?: No Patient able to express need for assistance with ADLs?: Yes Does the patient have difficulty dressing or bathing?: No Independently performs ADLs?: Yes (appropriate for developmental age) Does the patient have difficulty walking or climbing stairs?: Yes Weakness of Legs: Both Weakness of Arms/Hands: None  Permission Sought/Granted                  Emotional Assessment              Admission diagnosis:  Hyponatremia [E87.1] UTI (urinary tract infection) [N39.0] Acute cystitis with hematuria [N30.01] AKI (acute kidney injury) ([place]) [N17.9] Acute kidney injury ([place]) [N17.9] Hyponatremia syndrome [E87.1] Patient Active Problem List   Diagnosis Date Noted  . Goals of care, counseling/discussion  04/18/2020  . Cervical cancer ([place]) 04/17/2020  . Hyponatremia syndrome 04/14/2020  . UTI (urinary tract infection) 04/13/2020  . AKI (acute kidney injury) ([place]) 04/13/2020  . Hyponatremia 04/13/2020  . Orthostatic hypotension 04/13/2020  . Statin intolerance 02/19/2014  . Anxiety - very rare "panick attacks" 02/19/2014  . Essential hypertension 02/19/2014  . Obesity (BMI 30-39.9) 04/03/2013  . CAD S/P percutaneous coronary angioplasty   . Dyslipidemia, goal LDL below 70   . History of acute anterior wall MI 08/14/2004   PCP:  Patient, No Pcp Per Pharmacy:   [facility], [address] [place] [address] Phone: [phone] Fax: [phone]     Social Determinants of Health (SDOH) Interventions    Readmission Risk Interventions Readmission Risk Prevention Plan 04/20/2020  Transportation Screening Complete  PCP or Specialist Appt within 5-7 Days Complete  Medication Review (RN CM) Complete  Some recent data might be hidden

## 2020-04-20 NOTE — Care Management Important Message (Signed)
Important Message  Patient Details IM Letter given to the Patient Name: Eileen Burgess MRN: [id number] Date of Birth: 1955-11-21   Medicare Important Message Given:  Yes     [name] 04/20/2020, 9:40 AM

## 2020-04-20 NOTE — Significant Event (Signed)
Rapid Response Event Note   Reason for Call :  Primary nurse called regarding patients blood pressure and the patient with increased shortness of breath, with audible wheezes.    Initial Focused Assessment:  Patient is alert/ oriented x 4, with increased work of breathing and audible wheezing and congestion that does not clear with cough. Patients blood pressure at this time 164/140, Labetalol given. Breath sounds with course crackles in upper bases, and fine light crackles in left lower bases. Encouraged patient to deep breath and cough with the use IS.  Interventions:  Notified M. Denny Stat CXR Monitor blood pressure Labotetolol x 2 IS x 10 every hour while awake Flutter valve ABG Breathing treatment PRN     Plan of Care: Change vital signs to every 4 hours to monitor blood pressure, treat with PRN Labetalol and hydraLazine Encourage pulmonary toilet with IS, flutter valve, and deep breath and cough. Patient does not need to transfer to a higher level of care at this time.     Event Summary:  RRT called for audible wheezing, with mild increased work of breathing, Patient is alert/ oriented x 4, with increased work of breathing and audible wheezing and congestion that does not clear with cough. Patients blood pressure at this time 164/140, Labetalol given. Breath sounds with course crackles in upper bases, and fine light crackles in left lower bases. Encouraged patient to deep breath and cough with the use IS. See interventions and vital signs        MD Notified:  M. Sharlet [place] FNP  Call Time: 2114 Arrival Time: 2130 End Time:2300  [facility] Thena Devora MSN, RN-BC  [place]

## 2020-04-20 NOTE — Progress Notes (Addendum)
Paged Hospitalist on-call(Denny) due to patient having increased Shortness of breath and increased inspiratory/expiratory wheezing (patient did not have on 04/19/2020). Administering prn duoneb per orders and will continue to monitor.   Neb treatment given at 2011 - patient is unchanged. Moved patient from 2liters to 3liters - oxygen saturation 90-91%. BP 153/123, R: 24: Temp 97.4. Paged MD on-call and notified rapid response. Decreased fluids from 75 to 20-kvo   MD placed orders for incentive spirometry and a labetalol iv push. RR will come and assess patient  RR came at 2200: Patient's blood pressure was   166/147; labetalol pushed(10mg ) - RR placed an order for chest x-ray, ABGs - MD placed stat labetalol order for 10mg  again - IV push. Chest x-ray shows some pulmonary edema present. Will continue to monitor.

## 2020-04-20 NOTE — Progress Notes (Signed)
GYN Oncology Follow Up  Patient resting in bed in no acute distress.  Receiving pre-medications for chemotherapy. States back pain has improved.  Feelings of abdominal tightness remain. No flatus or BM reported.  No nausea or emesis but reporting moderate reflux symptoms. Hyperactive bowel sounds in the upper abdominal quadrants and hypoactive in the lower quadrants.  Abdomen less tympanic but remains distended and firm. Has Abd 2 view ordered this am. Has been ambulating to the bathroom and back. Tolerating sips of liquids.  No concerns voiced.  Continue plan per Dr. Alvy Bimler, Hospitalist Team, and Dr. Berline Lopes.

## 2020-04-20 NOTE — Progress Notes (Signed)
PROGRESS NOTE    Eileen Burgess   RXV:[id number]  DOB: [birth year]  DOA: 04/13/2020     6  PCP: Patient, No Pcp Per  CC: diarrhea  Hospital Course: Eileen Burgess is a 64 y.o. female with PMH CAD s/p angioplasty to the LAD, hypertension, history of tobacco abuse she is currently non-smoking, GERD that came to the hospital due to 3 weeks of episodic diarrhea that has progressively gotten worse, she has not received any antibiotics over the last year, she is also been nauseated with loss of appetite and decreased oral intake despite this she has continued to take her antihypertensive medication, she does relate that on the day of admission she started having some dysuria, she denies any  urgency frequency hematuria or hematochezia.  She relates some dizziness upon standing she relates she received a Covid vaccine more than 8 months ago.  ED Course: Afebrile,Vital signs are stable- saturations are greater than 97%, she was found to be hyponatremic, new acute renal failure with elevated AST and ALT unremarkable, UA  positive for bacteria and hemoglobin large, WBC/RBC>  50.  She underwent a CT renal stone protocol which revealed an underlying malignancy involving the cervix and invading into the posterior bladder wall with obstruction of the right UVJ or distal right ureter with right hydronephrosis.  Urology was consulted and she has since undergone a ureteral stent on the right side due to ureteral obstruction.   She was also evaluated by oncology and underwent cervical biopsy.  Frozen section and formal pathology are both consistent with adenocarcinoma.  She underwent PICC line placement on 04/19/2020.   Interval History:  No events overnight.  Still having abdominal fullness and denying much flatus and no BM. Denies N/V.  Understands plan is for chemo today. We plan to get an abd xray today to evaluate.   Old records reviewed in assessment of this patient  ROS: Constitutional: positive for  fatigue, Respiratory: negative for cough, Cardiovascular: negative for chest pain and Gastrointestinal: positive for abdominal pain  Assessment & Plan: Abdominal pain with dysuria, AKI, right hydronephrosis  Metastatic omental carcinomatosis.  Cervical cancer- new diagnosis Bowel obstruction vs ileus -CT abdomen pelvis showed possible neoplastic processes arising from the bladder urothelium or from cervix and invading into the posterior bladder wall with obstruction of right UVJ or distal right ureter, moderate right hydronephrosis, diffuse omental nodularity and caking consistent with metastatic disease. Moderate right and small left pleural effusions, associated partial compressive atelectasis of lower lobes.  Discussed with the patient, no prior history of malignancy (just had a history of ovarian cyst at the age of 31 years). -Urology consulted, s/p right ureteral stent placement on 8/14.  Biopsy of the posterior bladder with intraoperative findings suggest extrinsic mass invading bladder rather than primary bladder malignancy.   -CT chest with contrast showed small pleural effusion left, moderate sized right pleural effusion, moderate amount of abdominal free fluid, moderate to marked severe right-sided hydronephrosis.  No lung lesions. - frozen section consistent with adenocarcinoma; final path also shows adenocarcinoma from cervical biopsy - per oncology tentative plan is for carboplatin/paclitaxel on Thursday - PICC line placed 04/19/20 -Having minimal flatus but still no bowel movement. Abdomen slightly more distended today; will obtain xray abdomen daily at this point and monitor closely  Hyponatremia, dehydration -Appears to be hypovolemic related to recent diarrhea and poor p.o. intake, HCTZ use vs SIADH from ?malignancy.  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   Weight as of this encounter: 98 kg.  Antimicrobials: none  DVT prophylaxis: HSQ Code Status: Full Family Communication: none present Disposition Plan:  Status is: Inpatient  Remains inpatient appropriate because:Unsafe d/c plan, IV treatments appropriate due to intensity of illness or inability to take PO and Inpatient level of care appropriate due to severity of illness   Dispo: The patient is from: Home              Anticipated d/c is to: Home              Anticipated d/c date is: 3 days              Patient currently is not medically stable to d/c.  Objective: Blood pressure (!) 160/96, pulse 77, temperature 97.6 F (36.4 C), temperature source Oral, resp. rate 20, height 5\' 4"  (1.626 m), weight 98 kg, SpO2 94 %.  Examination: General appearance: alert, cooperative, fatigued and no distress Head: Normocephalic, without obvious abnormality, atraumatic Eyes: EOMI Lungs: clear to auscultation bilaterally Heart: regular rate and rhythm and S1, S2 normal Abdomen: soft, more distended compared to previous; NT; BS very hypoactive Extremities: no UE edema Skin: mobility and turgor normal Neurologic: Grossly normal  Consultants:   Gyn oncology  Medical oncology  Data Reviewed: I have personally reviewed following labs and imaging studies Results for orders placed or performed during the hospital encounter of 04/13/20 (from the past 24 hour(s))  CBC with Differential/Platelet     Status: Abnormal    Collection Time: 04/20/20  5:07 AM  Result Value Ref Range   WBC 7.7 4.0 - 10.5 K/uL   RBC 4.40 3.87 - 5.11 MIL/uL   Hemoglobin 12.2 12.0 - 15.0 g/dL   HCT 36.4 36 - 46 %   MCV 82.7 80.0 - 100.0 fL   MCH 27.7 26.0 - 34.0 pg   MCHC 33.5 30.0 - 36.0 g/dL   RDW 13.7 11.5 - 15.5 %   Platelets 305 150 - 400 K/uL   nRBC 0.0 0.0 - 0.2 %   Neutrophils Relative % 92 %   Neutro Abs 7.1 1.7 - 7.7 K/uL   Lymphocytes Relative 5 %   Lymphs Abs 0.4 (L) 0.7 - 4.0 K/uL   Monocytes Relative 2 %   Monocytes Absolute 0.2 0 - 1 K/uL   Eosinophils Relative 0 %   Eosinophils Absolute 0.0 0 - 0 K/uL   Basophils Relative 0 %   Basophils Absolute 0.0 0 - 0 K/uL   Immature Granulocytes 1 %   Abs Immature Granulocytes 0.07 0.00 - 0.07 K/uL  Comprehensive metabolic panel     Status: Abnormal   Collection Time: 04/20/20  5:07 AM  Result Value Ref Range   Sodium 130 (L) 135 - 145 mmol/L   Potassium 4.6 3.5 - 5.1 mmol/L   Chloride 100 98 - 111 mmol/L   CO2 17 (L) 22 - 32 mmol/L   Glucose, Bld  121 (H) 70 - 99 mg/dL   BUN 22 8 - 23 mg/dL   Creatinine, Ser 1.27 (H) 0.44 - 1.00 mg/dL   Calcium 8.7 (L) 8.9 - 10.3 mg/dL   Total Protein 5.7 (L) 6.5 - 8.1 g/dL   Albumin 2.6 (L) 3.5 - 5.0 g/dL   AST 14 (L) 15 - 41 U/L   ALT 12 0 - 44 U/L   Alkaline Phosphatase 50 38 - 126 U/L   Total Bilirubin 0.7 0.3 - 1.2 mg/dL   GFR calc non Af Amer 44 (L) >60 mL/min   GFR calc Af Amer 51 (L) >60 mL/min   Anion gap 13 5 - 15  Magnesium     Status: Abnormal   Collection Time: 04/20/20  5:07 AM  Result Value Ref Range   Magnesium 1.6 (L) 1.7 - 2.4 mg/dL    Recent Results (from the past 240 hour(s))  Urine culture     Status: Abnormal   Collection Time: 04/13/20  1:20 PM   Specimen: Urine, Random  Result Value Ref Range Status   Specimen Description   Final    URINE, RANDOM Performed at [facility], [place] [address] [place], [place] [address]    Special Requests   Final    NONE Performed at  [facility], [place] [address] [place], [place] [address]    Culture (A)  Final    20,000 COLONIES/mL MULTIPLE SPECIES PRESENT, SUGGEST RECOLLECTION   Report Status 04/14/2020 FINAL  Final  SARS Coronavirus 2 by RT PCR (hospital order, performed in [facility] hospital lab) Nasopharyngeal Nasopharyngeal Swab     Status: None   Collection Time: 04/13/20  2:13 PM   Specimen: Nasopharyngeal Swab  Result Value Ref Range Status   SARS Coronavirus 2 NEGATIVE NEGATIVE Final    Comment: (NOTE) SARS-CoV-2 target nucleic acids are NOT DETECTED.  The SARS-CoV-2 RNA is generally detectable in upper and lower respiratory specimens during the acute phase of infection. The lowest concentration of SARS-CoV-2 viral copies this assay can detect is 250 copies / mL. A negative result does not preclude SARS-CoV-2 infection and should not be used as the sole basis for treatment or other patient management decisions.  A negative result may occur with improper specimen collection / handling, submission of specimen other than nasopharyngeal swab, presence of viral mutation(s) within the areas targeted by this assay, and inadequate number of viral copies (<250 copies / mL). A negative result must be combined with clinical observations, patient history, and epidemiological information.  Fact Sheet for Patients:   StrictlyIdeas.no  Fact Sheet for Healthcare Providers: BankingDealers.co.za  This test is not yet approved or  cleared by the [place] FDA and has been authorized for detection and/or diagnosis of SARS-CoV-2 by FDA under an Emergency Use Authorization (EUA).  This EUA will remain in effect (meaning this test can be used) for the duration of the COVID-19 declaration under Section 564(b)(1) of the Act, 21 U.S.C. section 360bbb-3(b)(1), unless the authorization is terminated or revoked sooner.  Performed at [facility], [place] [address] [place], [place] [address]   Culture, blood (Routine X 2) w Reflex to ID Panel     Status: None   Collection Time: 04/13/20  6:28 PM   Specimen: BLOOD  Result Value Ref Range Status   Specimen Description   Final    BLOOD RIGHT ANTECUBITAL Performed at [facility], [place] [address], [place], [place] [address]  Special Requests   Final    BOTTLES DRAWN AEROBIC AND ANAEROBIC Blood Culture adequate volume Performed at [place] [address], [place], [place] [address]    Culture   Final    NO GROWTH 5 DAYS Performed at [facility], [place] [address] [place], [place] [address]    Report Status 04/18/2020 FINAL  Final  Culture, blood (Routine X 2) w Reflex to ID Panel     Status: None   Collection Time: 04/13/20  6:40 PM   Specimen: BLOOD RIGHT HAND  Result Value Ref Range Status   Specimen Description   Final    BLOOD RIGHT HAND Performed at [facility] [address] [place], [place] [address]    Special Requests   Final    BOTTLES DRAWN AEROBIC AND ANAEROBIC Blood Culture adequate volume Performed at [facility] [address], [place], [place] [address]    Culture   Final    NO GROWTH 5 DAYS Performed at [facility], [place] [address] [place], [place] [address]    Report Status 04/18/2020 FINAL  Final  MRSA PCR Screening     Status: None   Collection Time: 04/15/20  3:04 PM   Specimen: Nasal Mucosa; Nasopharyngeal  Result Value Ref Range Status   MRSA by PCR NEGATIVE NEGATIVE Final    Comment:        The GeneXpert MRSA Assay (FDA approved for NASAL specimens only), is one component of a comprehensive MRSA colonization surveillance program. It is not intended to diagnose MRSA infection nor to guide or monitor treatment for MRSA infections. Performed at [facility], [place] [address], [place],  [address]      Radiology  Studies: DG Abd Portable 2V  Result Date: 04/20/2020 CLINICAL DATA:  Hypoactive bowel sounds. EXAM: PORTABLE ABDOMEN - 2 VIEW COMPARISON:  No prior. FINDINGS: Double-J right ureteral stent in good anatomic position. Colon is slightly distended. Cecum measures 8.9 cm in diameter. Although these findings most likely represent colonic ileus follow-up exam suggested to exclude colonic obstruction. No small bowel distention. No free air. Bibasilar atelectasis/infiltrates. Small right pleural effusion. IMPRESSION: 1.  Double-J right ureteral stent in good anatomic position. 2. Colon is slightly distended. Cecum measures 8.9 cm. Although these findings most likely represent colonic ileus, follow-up exam suggested to exclude colonic obstruction. No small bowel distention. No free air. 3.  Bibasilar atelectasis/infiltrates. Electronically Signed   By: Marcello Moores  Register   On: 04/20/2020 13:00   DG Abd Portable 2V  Final Result    [place] EKG SITE RITE  Final Result    DG C-Arm 1-60 Min-No Report  Final Result    CT CHEST W CONTRAST  Final Result    CT RENAL STONE STUDY  Final Result    DG Chest 2 View  Final Result       Scheduled Meds: . CARBOplatin  460 mg Intravenous Once  . Chlorhexidine Gluconate Cloth  6 each Topical Daily  . heparin  5,000 Units Subcutaneous Q8H  . metoprolol succinate  25 mg Oral Daily  . PACLitaxel  135 mg/m2 (Treatment Plan Recorded) Intravenous Once  . pantoprazole  40 mg Oral Daily  . polyethylene glycol  17 g Oral Once  . senna-docusate  1 tablet Oral QHS  . sodium chloride flush  10-40 mL Intracatheter Q12H   PRN Meds: acetaminophen **OR** acetaminophen, alteplase, alum & mag hydroxide-simeth, Cold Pack, heparin lock flush,  heparin lock flush, hydrALAZINE, ipratropium-albuterol, nitroGLYCERIN, ondansetron **OR** ondansetron (ZOFRAN) IV, phenol, prochlorperazine, simethicone, sodium chloride flush, sodium chloride flush, sodium chloride flush Continuous  Infusions: . sodium chloride    . lactated ringers 75 mL/hr at 04/19/20 2345      LOS: 6 days  Time spent: Greater than 50% of the 35 minute visit was spent in counseling/coordination of care for the patient as laid out in the A&P.   Dwyane Dee, MD [facility] 04/20/2020, 1:21 PM   Contact via secure chat.  To contact the attending provider between 7A-7P or the covering provider during after hours 7P-7A, please log into the web site [email or web address] and access using universal [facility] password for that web site. If you do not have the password, please call the hospital operator.

## 2020-04-21 ENCOUNTER — Inpatient Hospital Stay (HOSPITAL_COMMUNITY): Payer: Medicare Other

## 2020-04-21 DIAGNOSIS — Z7189 Other specified counseling: Secondary | ICD-10-CM

## 2020-04-21 DIAGNOSIS — C539 Malignant neoplasm of cervix uteri, unspecified: Principal | ICD-10-CM

## 2020-04-21 LAB — COMPREHENSIVE METABOLIC PANEL
ALT: 12 U/L (ref 0–44)
AST: 29 U/L (ref 15–41)
Albumin: 2.7 g/dL — ABNORMAL LOW (ref 3.5–5.0)
Alkaline Phosphatase: 47 U/L (ref 38–126)
Anion gap: 15 (ref 5–15)
BUN: 30 mg/dL — ABNORMAL HIGH (ref 8–23)
CO2: 15 mmol/L — ABNORMAL LOW (ref 22–32)
Calcium: 8.5 mg/dL — ABNORMAL LOW (ref 8.9–10.3)
Chloride: 100 mmol/L (ref 98–111)
Creatinine, Ser: 1.25 mg/dL — ABNORMAL HIGH (ref 0.44–1.00)
GFR calc Af Amer: 52 mL/min — ABNORMAL LOW (ref >60)
GFR calc non Af Amer: 45 mL/min — ABNORMAL LOW (ref >60)
Glucose, Bld: 133 mg/dL — ABNORMAL HIGH (ref 70–99)
Potassium: 4.4 mmol/L (ref 3.5–5.1)
Sodium: 130 mmol/L — ABNORMAL LOW (ref 135–145)
Total Bilirubin: 0.7 mg/dL (ref 0.3–1.2)
Total Protein: 5.5 g/dL — ABNORMAL LOW (ref 6.5–8.1)

## 2020-04-21 LAB — CBC WITH DIFFERENTIAL/PLATELET
Abs Immature Granulocytes: 0.18 10*3/uL — ABNORMAL HIGH (ref 0.00–0.07)
Basophils Absolute: 0 10*3/uL (ref 0.0–0.1)
Basophils Relative: 0 %
Eosinophils Absolute: 0 10*3/uL (ref 0.0–0.5)
Eosinophils Relative: 0 %
HCT: 35.1 % — ABNORMAL LOW (ref 36.0–46.0)
Hemoglobin: 11.6 g/dL — ABNORMAL LOW (ref 12.0–15.0)
Immature Granulocytes: 2 %
Lymphocytes Relative: 4 %
Lymphs Abs: 0.4 10*3/uL — ABNORMAL LOW (ref 0.7–4.0)
MCH: 27.5 pg (ref 26.0–34.0)
MCHC: 33 g/dL (ref 30.0–36.0)
MCV: 83.2 fL (ref 80.0–100.0)
Monocytes Absolute: 0.5 10*3/uL (ref 0.1–1.0)
Monocytes Relative: 6 %
Neutro Abs: 8.1 10*3/uL — ABNORMAL HIGH (ref 1.7–7.7)
Neutrophils Relative %: 88 %
Platelets: 304 10*3/uL (ref 150–400)
RBC: 4.22 MIL/uL (ref 3.87–5.11)
RDW: 14 % (ref 11.5–15.5)
WBC: 9.3 10*3/uL (ref 4.0–10.5)
nRBC: 0 % (ref 0.0–0.2)

## 2020-04-21 LAB — MAGNESIUM: Magnesium: 1.8 mg/dL (ref 1.7–2.4)

## 2020-04-21 MED ORDER — FUROSEMIDE 10 MG/ML IJ SOLN
40.0000 mg | Freq: Once | INTRAMUSCULAR | Status: AC
  Administered 2020-04-21: 40 mg via INTRAVENOUS
  Filled 2020-04-21: qty 4

## 2020-04-21 MED ORDER — POLYETHYLENE GLYCOL 3350 17 G PO PACK
17.0000 g | PACK | Freq: Every day | ORAL | Status: DC
  Administered 2020-04-21 – 2020-04-26 (×6): 17 g via ORAL
  Filled 2020-04-21 (×5): qty 1

## 2020-04-21 MED ORDER — SENNOSIDES-DOCUSATE SODIUM 8.6-50 MG PO TABS
2.0000 | ORAL_TABLET | Freq: Every day | ORAL | Status: DC
  Administered 2020-04-21 – 2020-04-26 (×6): 2 via ORAL
  Filled 2020-04-21 (×6): qty 2

## 2020-04-21 MED ORDER — LABETALOL HCL 5 MG/ML IV SOLN
10.0000 mg | Freq: Once | INTRAVENOUS | Status: AC
  Administered 2020-04-21: 10 mg via INTRAVENOUS
  Filled 2020-04-21: qty 4

## 2020-04-21 NOTE — Progress Notes (Signed)
GYN ONC Follow Up  Patient resting in bed comfortably in no acute distress.  She states she had a rough night from a respiratory standpoint. She began experiencing difficulty breathing but reports improvement this am. She was given a breathing treatment at that time. She also reports lower abdomen discomfort that was last present prior to admission when she felt she was going to have a BM. No BM or flatus reported. Last BM 8/14. No nausea or emesis. Tolerating sipping on broth.  Alert, oriented x3. Audible wheezing noted in the upper airways. Abdomen remains firm and distended.  Hypoactive bowel sounds and tympanic on percussion.   64 year old with metastatic cervical cancer s/p cycle 1 of chemotherapy on 04/20/2020. Bowel regimen adjusted. Continue plan of care per Dr. Alvy Bimler and Hospitalist team.

## 2020-04-21 NOTE — Progress Notes (Signed)
RT in to see pt. for follow up after Rapid Response and found pt. more awake/alert and able to talk complete sentences w/o difficulty, stated that she was helped to the restroom and voided >#, remains on 3 lpm n/c with 95% sats, b/l b.s with fine crackles/wheezes t/o with better aeration, CPAP order placed if needed, RT to monitor.

## 2020-04-21 NOTE — Progress Notes (Addendum)
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 intake and hydrochlorothiazide use Sodium level improving with IV hydration and discontinuation of hydrochlorothiazide Monitor closely   AKI due to hydronephrosis, improving Due to dehydration/poor p.o. intake HCTZ and lisinopril placed on hold Status post ureteral stent Creatinine slowly improving with hydration   Protein calorie malnutrition Recommend dietitian consult   Abdominal carcinomatosis, tinkling bowel sounds, nausea, imminent bowel obstruction I discussed this with GYN surgeon She is on clear liquids only We will monitor closely Her exam findings are stable compared to yesterday Recommend continuing liquid diet for now MiraLAX and Senokot adjusted per discussion with GYN oncology   Hypertension Off lisinopril and hydrochlorothiazide secondary to AKI Hydralazine ordered as needed for systolic blood pressure greater than 160 Further management per hospitalist   CAD status post PTCA On metoprolol; aspirin on hold   Possible UTI versus pneumonia versus compression atelectasis Currently off antibiotics Monitor closely for signs of infection  Shortness of breath/respiratory distress/vascular congestion with pulmonary edema Rapid response called last evening Has been started on DuoNeb as needed Lasix ordered for today per hospitalist Monitor closely and further management per hospitalist   Goals of  care discussion She is aware she have stage IV disease Treatment goal is palliative The goal for treatment in this hospital stay is to resolve her bowel obstruction prior to discharge   Discharge planning She will likely be here for the next 3 to 5 days Please call oncology service over the weekend if questions arise I will return to check on her on Monday All questions were answered. The patient knows to call the clinic with any problems, questions or concerns.   Mikey Bussing, NP 04/21/2020 9:52 AM [name], MD  Subjective:  Rapid response called last evening due to low blood pressure, shortness breath, and audible wheezing Blood pressure was elevated and labetalol was given Chest x-ray showed cardiomegaly with vascular congestion and diffuse interstitial pulmonary edema and bilateral pleural effusions-moderate on the right and small on the left She was also noted to have bibasilar consolidations Was started on nebulizers and is on O2 at 3 L this morning A dose of Lasix has been ordered for today She is afebrile Reports breathing has improved this morning but still notices audible wheezing States reflux symptoms somewhat worse this morning She continues to have vague abdominal discomfort No bowel movement since last Saturday She has mild intermittent nausea, but no vomiting  Objective:  Vitals:   04/21/20 0523 04/21/20 0556  BP: (!) 128/107 (!) 128/94  Pulse: 92   Resp: (!) 24   Temp: 98.4 F (36.9 C)   SpO2: 92%      Intake/Output Summary (Last 24 hours) at 04/21/2020 0952 Last data filed at 04/20/2020 1848 Gross per 24 hour  Intake 1742.52 ml  Output 300 ml  Net 1442.52 ml    GENERAL:alert, no distress and comfortable RESP: Audible wheezing heard in her upper airways, otherwise diminished breath sounds ABDOMEN:abdomen  soft, mildly distended with ascites.  Hypoactive bowel sounds noted Musculoskeletal:no cyanosis of digits and no clubbing  NEURO: alert &  oriented x 3 with fluent speech, no focal motor/sensory deficits   Labs:  Recent Labs    04/19/20 0608 04/20/20 0507 04/21/20 0514  NA 131* 130* 130*  K 3.9 4.6 4.4  CL 103 100 100  CO2 15* 17* 15*  GLUCOSE 95 121* 133*  BUN 19 22 30*  CREATININE 1.32* 1.27* 1.25*  CALCIUM 8.4* 8.7* 8.5*  GFRNONAA 42* 44* 45*  GFRAA 49* 51* 52*  PROT 5.5* 5.7* 5.5*  ALBUMIN 2.6* 2.6* 2.7*  AST 16 14* 29  ALT 12 12 12   ALKPHOS 50 50 47  BILITOT 0.8 0.7 0.7    Studies:  DG Chest 1 View  Result Date: 04/20/2020 CLINICAL DATA:  Increasing short of breath EXAM: CHEST  1 VIEW COMPARISON:  04/13/2020, CT 04/15/2020 FINDINGS: Right upper extremity central venous catheter tip over the SVC. Cardiomegaly with vascular congestion and diffuse interstitial opacity consistent with edema. Moderate right-sided pleural effusion, likely layering and resulting in hazy asymmetric appearance of the right thorax. At least small left pleural effusion. Bibasilar consolidations. No pneumothorax. IMPRESSION: 1. Cardiomegaly with vascular congestion and diffuse interstitial pulmonary edema. 2. Bilateral pleural effusions, at least moderate on the right and small on the left. 3. Bibasilar consolidations Electronically Signed   By: Donavan Foil M.D.   On: 04/20/2020 22:20   DG Chest 2 View  Result Date: 04/13/2020 CLINICAL DATA:  Weakness diarrhea EXAM: CHEST - 2 VIEW COMPARISON:  07/28/2004 FINDINGS: Heart size upper normal.  Vascularity normal.  Coronary stent noted. Small bilateral pleural effusions. Mild bibasilar atelectasis/infiltrate. IMPRESSION: Interval development of mild bibasilar airspace disease and small pleural effusions. Negative for heart failure. Electronically Signed   By: Franchot Gallo M.D.   On: 04/13/2020 13:57   CT CHEST W CONTRAST  Result Date: 04/15/2020 CLINICAL DATA:  Cancer of unknown primary, staging. EXAM: CT CHEST WITH CONTRAST TECHNIQUE: Multidetector CT imaging of the chest was performed  during intravenous contrast administration. CONTRAST:  44mL OMNIPAQUE IOHEXOL 300 MG/ML  SOLN COMPARISON:  None. FINDINGS: Cardiovascular: There is mild calcification of the aortic arch. Normal heart size. No pericardial effusion. A coronary artery stent is seen. Mediastinum/Nodes: No enlarged mediastinal, hilar, or axillary lymph nodes. The thyroid gland and trachea demonstrate no significant findings. There is a small hiatal hernia. Lungs/Pleura: Mild bilateral lower lobe atelectasis is seen, right slightly greater than left. Very mild slightly nodular appearing scarring and/or atelectasis is seen along the posterior aspect of the inferior left upper lobe. There is a small left pleural effusion. A moderate sized right pleural effusion is also noted. No pneumothorax is identified. Upper Abdomen: A moderate amount of abdominal free fluid is seen. There is moderate to marked severity right-sided hydronephrosis with delayed renal cortical enhancement involving the right kidney. Musculoskeletal: No chest wall abnormality. No acute or significant osseous findings. Degenerative changes seen throughout the thoracic spine. IMPRESSION: 1. Small left pleural effusion with a moderate sized right pleural effusion. 2. Mild bilateral lower lobe atelectasis, right slightly greater than left. 3. Very mild slightly nodular appearing scarring and/or atelectasis along the posterior aspect of the inferior left upper lobe. 4. Moderate amount of abdominal free fluid. 5. Moderate to marked severity right-sided hydronephrosis with delayed renal cortical enhancement involving the right kidney. This is suggestive of partial renal obstruction. 6. Small hiatal hernia. 7. Aortic atherosclerosis. Aortic Atherosclerosis (ICD10-I70.0). Electronically Signed  By: Virgina Norfolk M.D.   On: 04/15/2020 15:38   DG Abd Portable 2V  Result Date: 04/20/2020 CLINICAL DATA:  Hypoactive bowel sounds. EXAM: PORTABLE ABDOMEN - 2 VIEW COMPARISON:  No  prior. FINDINGS: Double-J right ureteral stent in good anatomic position. Colon is slightly distended. Cecum measures 8.9 cm in diameter. Although these findings most likely represent colonic ileus follow-up exam suggested to exclude colonic obstruction. No small bowel distention. No free air. Bibasilar atelectasis/infiltrates. Small right pleural effusion. IMPRESSION: 1.  Double-J right ureteral stent in good anatomic position. 2. Colon is slightly distended. Cecum measures 8.9 cm. Although these findings most likely represent colonic ileus, follow-up exam suggested to exclude colonic obstruction. No small bowel distention. No free air. 3.  Bibasilar atelectasis/infiltrates. Electronically Signed   By: Marcello Moores  Register   On: 04/20/2020 13:00   DG C-Arm 1-60 Min-No Report  Result Date: 04/15/2020 Fluoroscopy was utilized by the requesting physician.  No radiographic interpretation.   CT RENAL STONE STUDY  Result Date: 04/14/2020 CLINICAL DATA:  64 year old female with hematuria. EXAM: CT ABDOMEN AND PELVIS WITHOUT CONTRAST TECHNIQUE: Multidetector CT imaging of the abdomen and pelvis was performed following the standard protocol without IV contrast. COMPARISON:  None. FINDINGS: Evaluation of this exam is limited in the absence of intravenous contrast. Lower chest: Partially visualized moderate right and small left pleural effusions. There is associated partial compressive atelectasis of the lower lobes. Pneumonia is not excluded. Clinical correlation is recommended. There is coronary vascular calcification. Partially visualized trace pericardial effusion. There is no intra-abdominal free air. Small ascites. Hepatobiliary: No focal liver abnormality is seen. No gallstones, gallbladder wall thickening, or biliary dilatation. Pancreas: Unremarkable. No pancreatic ductal dilatation or surrounding inflammatory changes. Spleen: Normal in size without focal abnormality. Adrenals/Urinary Tract: The adrenal glands  unremarkable. The left kidney is unremarkable. There is a moderate right hydronephrosis with mild right hydroureter. No stone identified. There is a transition point in the distal right ureter or right UVJ. The urinary bladder is minimally distended. There is possible mild nodular thickening of the posterior bladder wall adjacent to the right UVJ (77/2). There is loss of fat plane between the anterior lower uterus/cervix and posterior bladder wall. The lower uterus/cervical region is somewhat enlarged. Findings concerning for a neoplastic process either arising from the bladder urothelium or from the cervix and invading into the posterior bladder wall with obstruction of the right UVJ or distal right ureter. Stomach/Bowel: There is a small hiatal hernia. There is no bowel obstruction. There is sigmoid diverticulosis. The appendix is not visualized with certainty. No inflammatory changes identified in the right lower quadrant. Vascular/Lymphatic: Moderate aortoiliac atherosclerotic disease. The IVC is unremarkable. No portal venous gas. There is no adenopathy. Reproductive: The uterus is anteverted. Enlargement of the lower uterus/cervical region as described above. Other: There is diffuse omental nodularity and caking consistent with metastatic disease. Musculoskeletal: Osteopenia. No acute osseous pathology. IMPRESSION: 1. Findings concerning for a neoplastic process either arising from the bladder urothelium or from the cervix and invading into the posterior bladder wall with obstruction of the right UVJ or distal right ureter. There is associated moderate right hydronephrosis. Sampling of the ascitic fluid may provide further diagnostic information. 2. Diffuse omental nodularity and caking consistent with metastatic disease. 3. Moderate right and small left pleural effusions with associated partial compressive atelectasis of the lower lobes. Pneumonia is not excluded. Clinical correlation is recommended. 4.  Sigmoid diverticulosis. No bowel obstruction. 5. Aortic Atherosclerosis (ICD10-I70.0). Electronically Signed  By: Anner Crete M.D.   On: 04/14/2020 17:44   [place] EKG SITE RITE  Result Date: 04/18/2020 If Site Rite image not attached, placement could not be confirmed due to current cardiac rhythm.

## 2020-04-21 NOTE — Progress Notes (Signed)
RT in to see pt. for follow up assessment/CPAP order, she is more awake/alert, remains on low flow n/c with b/l b.s. slight coarse with minimal wheezes, no other c/o, made aware to notify if needed.

## 2020-04-21 NOTE — Progress Notes (Addendum)
PROGRESS NOTE    Eileen Burgess   KGU:[id number]  DOB: 03-13-1956  DOA: 04/13/2020     7  PCP: Patient, No Pcp Per  CC: diarrhea  Hospital Course: Eileen Burgess is a 64 y.o. female with PMH CAD s/p angioplasty to the LAD, hypertension, history of tobacco abuse she is currently non-smoking, GERD that came to the hospital due to 3 weeks of episodic diarrhea that has progressively gotten worse, she has not received any antibiotics over the last year, she is also been nauseated with loss of appetite and decreased oral intake despite this she has continued to take her antihypertensive medication, she does relate that on the day of admission she started having some dysuria, she denies any  urgency frequency hematuria or hematochezia.  She relates some dizziness upon standing she relates she received a Covid vaccine more than 8 months ago.  ED Course: Afebrile,Vital signs are stable- saturations are greater than 97%, she was found to be hyponatremic, new acute renal failure with elevated AST and ALT unremarkable, UA  positive for bacteria and hemoglobin large, WBC/RBC>  50.  She underwent a CT renal stone protocol which revealed an underlying malignancy involving the cervix and invading into the posterior bladder wall with obstruction of the right UVJ or distal right ureter with right hydronephrosis.  Urology was consulted and she has since undergone a ureteral stent on the right side due to ureteral obstruction.   She was also evaluated by oncology and underwent cervical biopsy.  Frozen section and formal pathology are both consistent with adenocarcinoma.  She underwent PICC line placement on 04/19/2020.   On 8/19, she underwent first dose of chemo and tolerated well. Later that night she developed respiratory distress due to volume overload from IVF and a CXR showed vascular congestion. IVF were discontinued and she was started on lasix.   She continued to have abdominal findings concerning for ileus  however was not very symptomatic. Serial abdominal xrays showed ongoing colonic gaseous distension.   Interval History:  Rapid response called overnight for respiratory distress. She has been on IVF for AKI and hypoNa as well as due to poor oral intake. Her CXR showed fluid overload; today her IVF are discontinued and she is give a dose of lasix.  There were no obvious reactions to chemo yesterday.  Her abdomen is still firm and distended, slightly worse today even. The xray this morning does not look substantially different or improved.  She still denies any significant flatus or bowel movements.  Does not also appear to have any significant nausea and certainly no vomiting.  Old records reviewed in assessment of this patient  ROS: Constitutional: positive for fatigue, Respiratory: negative for cough, Cardiovascular: negative for chest pain and Gastrointestinal: positive for abdominal pain  Assessment & Plan: Abdominal pain with dysuria, AKI, right hydronephrosis  Metastatic omental carcinomatosis.  Cervical cancer- new diagnosis Bowel obstruction vs ileus -CT abdomen pelvis showed possible neoplastic processes arising from the bladder urothelium or from cervix and invading into the posterior bladder wall with obstruction of right UVJ or distal right ureter, moderate right hydronephrosis, diffuse omental nodularity and caking consistent with metastatic disease. Moderate right and small left pleural effusions, associated partial compressive atelectasis of lower lobes.  Discussed with the patient, no prior history of malignancy (just had a history of ovarian cyst at the age of 57 years). -Urology consulted, s/p right ureteral stent placement on 8/14.  Biopsy of the posterior bladder with intraoperative findings suggest extrinsic  mass invading bladder rather than primary bladder malignancy.   -CT chest with contrast showed small pleural effusion left, moderate sized right pleural effusion, moderate  amount of abdominal free fluid, moderate to marked severe right-sided hydronephrosis.  No lung lesions. - frozen section consistent with adenocarcinoma; final path also shows adenocarcinoma from cervical biopsy -Received infusion of carboplatin/paclitaxel on 04/20/2020 - PICC line placed 04/19/20 -Having minimal flatus but still no bowel movement. Abdomen still distended with minimal bowel sounds.  Continue serial abdominal x-rays, appears to be underlying ileus at this time.  Continue on liquid diet, do not advance.  If becomes nauseous, will make n.p.o. and place NG tube  Respiratory distress - s/p rapid response evening of 04/20/20 - likely volume overload from multiple days of IVF - hold further IVF for now; approximately 3L positive - trial of lasix today; if responds well will monitor renal function and consider further doses   Hyponatremia, dehydration -Appears to be hypovolemic related to recent diarrhea and poor p.o. intake, HCTZ use vs SIADH from ?malignancy.  Serum osmolality 268, urine osmolality 617, urine sodium 50 -monitor on IVF - overall stable Na  Acute on CKDIII - baseline creat appears to be 1.1, but GFR in 2018 mid 50s -In the setting of #1, dehydration, HCTZ use -Continue to hold lisinopril, HCTZ -Status post ureteral stent -Creatinine improving; continue following  Hypertension -BP stable, continue metoprolol  - continue metoprolol, avoid diuretics, ACE inhibitor  CAD status post PTCA Continue metoprolol, aspirin  GERD Continue PPI  Obesity Estimated body mass index is 37.09 kg/m as calculated from the following:   Height as of this encounter: 5\' 4"  (1.626 m).   Weight as of this encounter: 98 kg.  Antimicrobials: none  DVT prophylaxis: HSQ Code Status: Full Family Communication: none present Disposition Plan:  Status is: Inpatient  Remains inpatient appropriate because:Unsafe d/c plan, IV treatments appropriate due to intensity of illness or  inability to take PO and Inpatient level of care appropriate due to severity of illness   Dispo: The patient is from: Home              Anticipated d/c is to: Home              Anticipated d/c date is: 3 days              Patient currently is not medically stable to d/c.  Objective: Blood pressure 113/75, pulse 88, temperature (!) 97.5 F (36.4 C), temperature source Oral, resp. rate 18, height 5\' 4"  (1.626 m), weight 98 kg, SpO2 95 %.  Examination: General appearance: alert, cooperative, fatigued and no distress Head: Normocephalic, without obvious abnormality, atraumatic Eyes: EOMI Lungs: clear to auscultation bilaterally Heart: regular rate and rhythm and S1, S2 normal Abdomen: soft, more distended compared to previous; NT; BS very hypoactive Extremities: no UE edema Skin: mobility and turgor normal Neurologic: Grossly normal  Consultants:   Gyn oncology  Medical oncology  Data Reviewed: I have personally reviewed following labs and imaging studies Results for orders placed or performed during the hospital encounter of 04/13/20 (from the past 24 hour(s))  Blood gas, arterial     Status: Abnormal   Collection Time: 04/20/20 10:18 PM  Result Value Ref Range   pH, Arterial 7.314 (L) 7.35 - 7.45   pCO2 arterial 23.0 (L) 32 - 48 mmHg   pO2, Arterial 71.5 (L) 83 - 108 mmHg   Bicarbonate 11.3 (L) 20.0 - 28.0 mmol/L   Acid-base deficit 13.1 (  H) 0.0 - 2.0 mmol/L   O2 Saturation 91.7 %   Patient temperature 98.6    Allens test (pass/fail) PASS PASS  CBC with Differential/Platelet     Status: Abnormal   Collection Time: 04/21/20  5:14 AM  Result Value Ref Range   WBC 9.3 4.0 - 10.5 K/uL   RBC 4.22 3.87 - 5.11 MIL/uL   Hemoglobin 11.6 (L) 12.0 - 15.0 g/dL   HCT 35.1 (L) 36 - 46 %   MCV 83.2 80.0 - 100.0 fL   MCH 27.5 26.0 - 34.0 pg   MCHC 33.0 30.0 - 36.0 g/dL   RDW 14.0 11.5 - 15.5 %   Platelets 304 150 - 400 K/uL   nRBC 0.0 0.0 - 0.2 %   Neutrophils Relative % 88 %    Neutro Abs 8.1 (H) 1.7 - 7.7 K/uL   Lymphocytes Relative 4 %   Lymphs Abs 0.4 (L) 0.7 - 4.0 K/uL   Monocytes Relative 6 %   Monocytes Absolute 0.5 0 - 1 K/uL   Eosinophils Relative 0 %   Eosinophils Absolute 0.0 0 - 0 K/uL   Basophils Relative 0 %   Basophils Absolute 0.0 0 - 0 K/uL   Immature Granulocytes 2 %   Abs Immature Granulocytes 0.18 (H) 0.00 - 0.07 K/uL  Comprehensive metabolic panel     Status: Abnormal   Collection Time: 04/21/20  5:14 AM  Result Value Ref Range   Sodium 130 (L) 135 - 145 mmol/L   Potassium 4.4 3.5 - 5.1 mmol/L   Chloride 100 98 - 111 mmol/L   CO2 15 (L) 22 - 32 mmol/L   Glucose, Bld 133 (H) 70 - 99 mg/dL   BUN 30 (H) 8 - 23 mg/dL   Creatinine, Ser 1.25 (H) 0.44 - 1.00 mg/dL   Calcium 8.5 (L) 8.9 - 10.3 mg/dL   Total Protein 5.5 (L) 6.5 - 8.1 g/dL   Albumin 2.7 (L) 3.5 - 5.0 g/dL   AST 29 15 - 41 U/L   ALT 12 0 - 44 U/L   Alkaline Phosphatase 47 38 - 126 U/L   Total Bilirubin 0.7 0.3 - 1.2 mg/dL   GFR calc non Af Amer 45 (L) >60 mL/min   GFR calc Af Amer 52 (L) >60 mL/min   Anion gap 15 5 - 15  Magnesium     Status: None   Collection Time: 04/21/20  5:14 AM  Result Value Ref Range   Magnesium 1.8 1.7 - 2.4 mg/dL    Recent Results (from the past 240 hour(s))  Urine culture     Status: Abnormal   Collection Time: 04/13/20  1:20 PM   Specimen: Urine, Random  Result Value Ref Range Status   Specimen Description   Final    URINE, RANDOM Performed at [facility], [place] [address], [place], [place] [address]    Special Requests   Final    NONE Performed at [facility], [place] [address] [place], [place] [address]    Culture (A)  Final    20,000 COLONIES/mL MULTIPLE SPECIES PRESENT, SUGGEST RECOLLECTION   Report Status 04/14/2020 FINAL  Final  SARS Coronavirus 2 by RT PCR (hospital order, performed in [facility] hospital lab) Nasopharyngeal Nasopharyngeal Swab     Status: None   Collection Time: 04/13/20   2:13 PM   Specimen: Nasopharyngeal Swab  Result Value Ref Range Status   SARS Coronavirus 2 NEGATIVE NEGATIVE Final    Comment: (  NOTE) SARS-CoV-2 target nucleic acids are NOT DETECTED.  The SARS-CoV-2 RNA is generally detectable in upper and lower respiratory specimens during the acute phase of infection. The lowest concentration of SARS-CoV-2 viral copies this assay can detect is 250 copies / mL. A negative result does not preclude SARS-CoV-2 infection and should not be used as the sole basis for treatment or other patient management decisions.  A negative result may occur with improper specimen collection / handling, submission of specimen other than nasopharyngeal swab, presence of viral mutation(s) within the areas targeted by this assay, and inadequate number of viral copies (<250 copies / mL). A negative result must be combined with clinical observations, patient history, and epidemiological information.  Fact Sheet for Patients:   StrictlyIdeas.no  Fact Sheet for Healthcare Providers: BankingDealers.co.za  This test is not yet approved or  cleared by the [place] FDA and has been authorized for detection and/or diagnosis of SARS-CoV-2 by FDA under an Emergency Use Authorization (EUA).  This EUA will remain in effect (meaning this test can be used) for the duration of the COVID-19 declaration under Section 564(b)(1) of the Act, 21 U.S.C. section 360bbb-3(b)(1), unless the authorization is terminated or revoked sooner.  Performed at [facility], [place] [address], [place], [place] [address]   Culture, blood (Routine X 2) w Reflex to ID Panel     Status: None   Collection Time: 04/13/20  6:28 PM   Specimen: BLOOD  Result Value Ref Range Status   Specimen Description   Final    BLOOD RIGHT ANTECUBITAL Performed at [facility], [place] [address], [place], [place] [address]    Special Requests   Final     BOTTLES DRAWN AEROBIC AND ANAEROBIC Blood Culture adequate volume Performed at [place] [address], [place], [place] [address]    Culture   Final    NO GROWTH 5 DAYS Performed at [facility], [place] [address] [place], [place] [address]    Report Status 04/18/2020 FINAL  Final  Culture, blood (Routine X 2) w Reflex to ID Panel     Status: None   Collection Time: 04/13/20  6:40 PM   Specimen: BLOOD RIGHT HAND  Result Value Ref Range Status   Specimen Description   Final    BLOOD RIGHT HAND Performed at [place] [address] [place], [place] [address]    Special Requests   Final    BOTTLES DRAWN AEROBIC AND ANAEROBIC Blood Culture adequate volume Performed at [place] [address] [place], [place] [address]    Culture   Final    NO GROWTH 5 DAYS Performed at [facility], [place] [address] [place], [place] [address]    Report Status 04/18/2020 FINAL  Final  MRSA PCR Screening     Status: None   Collection Time: 04/15/20  3:04 PM   Specimen: Nasal Mucosa; Nasopharyngeal  Result Value Ref Range Status   MRSA by PCR NEGATIVE NEGATIVE Final    Comment:        The GeneXpert MRSA Assay (FDA approved for NASAL specimens only), is one component of a comprehensive MRSA colonization surveillance program. It is not intended to diagnose MRSA infection nor to guide or monitor treatment for MRSA infections. Performed at [facility], [place] [address], [place], [place] [address]      Radiology Studies: DG Chest 1 View  Result Date: 04/20/2020 CLINICAL  DATA:  Increasing short of breath EXAM: CHEST  1 VIEW COMPARISON:  04/13/2020, CT 04/15/2020 FINDINGS: Right upper extremity central venous catheter tip over the SVC. Cardiomegaly with vascular congestion and diffuse interstitial opacity consistent with edema. Moderate right-sided pleural effusion, likely layering and resulting  in hazy asymmetric appearance of the right thorax. At least small left pleural effusion. Bibasilar consolidations. No pneumothorax. IMPRESSION: 1. Cardiomegaly with vascular congestion and diffuse interstitial pulmonary edema. 2. Bilateral pleural effusions, at least moderate on the right and small on the left. 3. Bibasilar consolidations Electronically Signed   By: Donavan Foil M.D.   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 Hypoactive bowel sounds. EXAM: PORTABLE ABDOMEN - 2 VIEW COMPARISON:  KUB 04/20/2020.  CT Abdomen and Pelvis 04/14/2020. FINDINGS: Upright and supine views of the abdomen and pelvis. Stable right double-J ureteral stent. Mildly to moderately gas distended large bowel again noted, with gas present to the mid sigmoid. As before, no dilated small bowel loops. But gas is increased since 04/14/2020. No pneumoperitoneum. Stable lung bases, right pleural effusion again evident. Stable visualized osseous structures. IMPRESSION: 1. Continued gas distended colon to the sigmoid, although no dilated small bowel to strongly suggest mechanical obstruction. Continue to favor ileus. 2. No free air. Stable right ureteral stent. Persistent right pleural effusion. Electronically Signed   By: Genevie Ann M.D.   On: 04/21/2020 13:54   DG Abd Portable 2V  Result Date: 04/20/2020 CLINICAL DATA:  Hypoactive bowel sounds. EXAM: PORTABLE ABDOMEN - 2 VIEW COMPARISON:  No prior. FINDINGS: Double-J right ureteral stent in good anatomic position. Colon is slightly distended. Cecum measures 8.9 cm in diameter. Although these findings most likely represent colonic ileus follow-up exam suggested to exclude colonic obstruction. No small bowel distention. No free air. Bibasilar atelectasis/infiltrates. Small right pleural effusion. IMPRESSION: 1.  Double-J right ureteral stent in good anatomic position. 2.  Colon is slightly distended. Cecum measures 8.9 cm. Although these findings most likely represent colonic ileus, follow-up exam suggested to exclude colonic obstruction. No small bowel distention. No free air. 3.  Bibasilar atelectasis/infiltrates. Electronically Signed   By: Marcello Moores  Register   On: 04/20/2020 13:00   DG Abd Portable 2V  Final Result    DG Chest 1 View  Final Result    DG Abd Portable 2V  Final Result    [place] EKG SITE RITE  Final Result    DG C-Arm 1-60 Min-No Report  Final Result    CT CHEST W CONTRAST  Final Result    CT RENAL STONE STUDY  Final Result    DG Chest 2 View  Final Result       Scheduled Meds: . Chlorhexidine Gluconate Cloth  6 each Topical Daily  . heparin  5,000 Units Subcutaneous Q8H  . metoprolol succinate  25 mg Oral Daily  . pantoprazole  40 mg Oral Daily  . polyethylene glycol  17 g Oral Daily  . senna-docusate  2 tablet Oral QHS  . sodium bicarbonate  650 mg Oral BID  . sodium chloride flush  10-40 mL Intracatheter Q12H   PRN Meds: acetaminophen **OR** acetaminophen, alteplase, alum & mag hydroxide-simeth, heparin lock flush, heparin lock flush, hydrALAZINE, ipratropium-albuterol, nitroGLYCERIN, ondansetron **OR** ondansetron (ZOFRAN) IV, phenol, prochlorperazine, simethicone, sodium chloride flush, sodium chloride flush, sodium chloride flush Continuous Infusions: . sodium chloride        LOS:  7 days  Time spent: Greater than 50% of the 35 minute visit was spent in counseling/coordination of care for the patient as laid out in the A&P.   Dwyane Dee, MD [facility] 04/21/2020, 3:45 PM   Contact via secure chat.  To contact the attending provider between 7A-7P or the covering provider during after hours 7P-7A, please log into the web site [email or web address] and access using universal [place] password for that web site. If you do not have the password, please call the hospital operator.

## 2020-04-22 ENCOUNTER — Inpatient Hospital Stay (HOSPITAL_COMMUNITY): Payer: Medicare Other

## 2020-04-22 LAB — COMPREHENSIVE METABOLIC PANEL
ALT: 15 U/L (ref 0–44)
AST: 32 U/L (ref 15–41)
Albumin: 2.8 g/dL — ABNORMAL LOW (ref 3.5–5.0)
Alkaline Phosphatase: 46 U/L (ref 38–126)
Anion gap: 13 (ref 5–15)
BUN: 38 mg/dL — ABNORMAL HIGH (ref 8–23)
CO2: 18 mmol/L — ABNORMAL LOW (ref 22–32)
Calcium: 8.3 mg/dL — ABNORMAL LOW (ref 8.9–10.3)
Chloride: 101 mmol/L (ref 98–111)
Creatinine, Ser: 1.46 mg/dL — ABNORMAL HIGH (ref 0.44–1.00)
GFR calc Af Amer: 43 mL/min — ABNORMAL LOW (ref >60)
GFR calc non Af Amer: 37 mL/min — ABNORMAL LOW (ref >60)
Glucose, Bld: 107 mg/dL — ABNORMAL HIGH (ref 70–99)
Potassium: 3.9 mmol/L (ref 3.5–5.1)
Sodium: 132 mmol/L — ABNORMAL LOW (ref 135–145)
Total Bilirubin: 0.7 mg/dL (ref 0.3–1.2)
Total Protein: 5.7 g/dL — ABNORMAL LOW (ref 6.5–8.1)

## 2020-04-22 LAB — CBC WITH DIFFERENTIAL/PLATELET
Abs Immature Granulocytes: 0.05 10*3/uL (ref 0.00–0.07)
Basophils Absolute: 0 10*3/uL (ref 0.0–0.1)
Basophils Relative: 0 %
Eosinophils Absolute: 0 10*3/uL (ref 0.0–0.5)
Eosinophils Relative: 0 %
HCT: 36.3 % (ref 36.0–46.0)
Hemoglobin: 12.2 g/dL (ref 12.0–15.0)
Immature Granulocytes: 1 %
Lymphocytes Relative: 2 %
Lymphs Abs: 0.2 10*3/uL — ABNORMAL LOW (ref 0.7–4.0)
MCH: 27.7 pg (ref 26.0–34.0)
MCHC: 33.6 g/dL (ref 30.0–36.0)
MCV: 82.3 fL (ref 80.0–100.0)
Monocytes Absolute: 0.6 10*3/uL (ref 0.1–1.0)
Monocytes Relative: 6 %
Neutro Abs: 8.1 10*3/uL — ABNORMAL HIGH (ref 1.7–7.7)
Neutrophils Relative %: 91 %
Platelets: 249 10*3/uL (ref 150–400)
RBC: 4.41 MIL/uL (ref 3.87–5.11)
RDW: 14.1 % (ref 11.5–15.5)
WBC: 9 10*3/uL (ref 4.0–10.5)
nRBC: 0 % (ref 0.0–0.2)

## 2020-04-22 LAB — MAGNESIUM: Magnesium: 2.2 mg/dL (ref 1.7–2.4)

## 2020-04-22 NOTE — Progress Notes (Signed)
PROGRESS NOTE    Eileen Burgess   ZYS:[id number]  DOB: 06/17/1956  DOA: 04/13/2020     8  PCP: Patient, No Pcp Per  CC: diarrhea  Hospital Course: Eileen Burgess is a 64 y.o. female with PMH CAD s/p angioplasty to the LAD, hypertension, history of tobacco abuse she is currently non-smoking, GERD that came to the hospital due to 3 weeks of episodic diarrhea that has progressively gotten worse, she has not received any antibiotics over the last year, she is also been nauseated with loss of appetite and decreased oral intake despite this she has continued to take her antihypertensive medication, she does relate that on the day of admission she started having some dysuria, she denies any  urgency frequency hematuria or hematochezia.  She relates some dizziness upon standing she relates she received a Covid vaccine more than 8 months ago.  ED Course: Afebrile,Vital signs are stable- saturations are greater than 97%, she was found to be hyponatremic, new acute renal failure with elevated AST and ALT unremarkable, UA  positive for bacteria and hemoglobin large, WBC/RBC>  50.  She underwent a CT renal stone protocol which revealed an underlying malignancy involving the cervix and invading into the posterior bladder wall with obstruction of the right UVJ or distal right ureter with right hydronephrosis.  Urology was consulted and she has since undergone a ureteral stent on the right side due to ureteral obstruction.   She was also evaluated by oncology and underwent cervical biopsy.  Frozen section and formal pathology are both consistent with adenocarcinoma.  She underwent PICC line placement on 04/19/2020.   On 8/19, she underwent first dose of chemo and tolerated well. Later that night she developed respiratory distress due to volume overload from IVF and a CXR showed vascular congestion. IVF were discontinued and she was started on lasix.   She continued to have abdominal findings concerning for ileus  however was not very symptomatic. Serial abdominal xrays showed ongoing colonic gaseous distension.   Interval History:  No events overnight.  Resting more comfortably in bed this morning.  Breathing status remained stable and no further shortness of breath.  Tolerated chemo overall well.  Still not endorsing much flatus and no bowel movements.  Denies any nausea or vomiting.  No significant abdominal pain either, mostly feeling of "bloating".  Old records reviewed in assessment of this patient  ROS: Constitutional: positive for fatigue, Respiratory: negative for cough, Cardiovascular: negative for chest pain and Gastrointestinal: positive for abdominal pain  Assessment & Plan: Abdominal pain with dysuria, AKI, right hydronephrosis  Metastatic omental carcinomatosis.  Cervical cancer- new diagnosis Bowel obstruction vs ileus -CT abdomen pelvis showed possible neoplastic processes arising from the bladder urothelium or from cervix and invading into the posterior bladder wall with obstruction of right UVJ or distal right ureter, moderate right hydronephrosis, diffuse omental nodularity and caking consistent with metastatic disease. Moderate right and small left pleural effusions, associated partial compressive atelectasis of lower lobes.  Discussed with the patient, no prior history of malignancy (just had a history of ovarian cyst at the age of 82 years). -Urology consulted, s/p right ureteral stent placement on 8/14.  Biopsy of the posterior bladder with intraoperative findings suggest extrinsic mass invading bladder rather than primary bladder malignancy.   -CT chest with contrast showed small pleural effusion left, moderate sized right pleural effusion, moderate amount of abdominal free fluid, moderate to marked severe right-sided hydronephrosis.  No lung lesions. - frozen section consistent with  adenocarcinoma; final path also shows adenocarcinoma from cervical biopsy -Received infusion of  carboplatin/paclitaxel on 04/20/2020 - PICC line placed 04/19/20 -Having minimal flatus but still no bowel movement. Abdomen still distended with minimal bowel sounds.  Continue serial abdominal x-rays, appears to be underlying ileus at this time.  Continue on liquid diet, do not advance.  If becomes nauseous, will make n.p.o. and place NG tube  Respiratory distress - s/p rapid response evening of 04/20/20 - likely volume overload from multiple days of IVF - hold further IVF for now; approximately 3L positive -Diuresed well with Lasix on 04/21/2020.  Renal function mildly worsened, hold off on further diuresis at this time  Hyponatremia, dehydration -Appears to be hypovolemic related to recent diarrhea and poor p.o. intake, HCTZ use vs SIADH from ?malignancy.  Serum osmolality 268, urine osmolality 617, urine sodium 50 - overall stable Na  Acute on CKDIII - baseline creat appears to be 1.1, but GFR in 2018 mid 50s -In the setting of #1, dehydration, HCTZ use -Continue to hold lisinopril, HCTZ -Status post ureteral stent  Hypertension -BP stable, continue metoprolol  - continue metoprolol, avoid diuretics, ACE inhibitor  CAD status post PTCA Continue metoprolol, aspirin  GERD Continue PPI  Obesity Estimated body mass index is 37.09 kg/m as calculated from the following:   Height as of this encounter: 5\' 4"  (1.626 m).   Weight as of this encounter: 98 kg.  Antimicrobials: none  DVT prophylaxis: HSQ Code Status: Full Family Communication: none present Disposition Plan:  Status is: Inpatient  Remains inpatient appropriate because:Unsafe d/c plan, IV treatments appropriate due to intensity of illness or inability to take PO and Inpatient level of care appropriate due to severity of illness   Dispo: The patient is from: Home              Anticipated d/c is to: Home              Anticipated d/c date is: 3 days              Patient currently is not medically stable to  d/c.  Objective: Blood pressure 132/79, pulse 93, temperature (!) 97.4 F (36.3 C), temperature source Oral, resp. rate (!) 21, height 5\' 4"  (1.626 m), weight 98 kg, SpO2 97 %.  Examination: General appearance: alert, cooperative, fatigued and no distress Head: Normocephalic, without obvious abnormality, atraumatic Eyes: EOMI Lungs: clear to auscultation bilaterally Heart: regular rate and rhythm and S1, S2 normal Abdomen: soft, more distended compared to previous; NT; BS very hypoactive Extremities: no UE edema Skin: mobility and turgor normal Neurologic: Grossly normal  Consultants:   Gyn oncology  Medical oncology  Data Reviewed: I have personally reviewed following labs and imaging studies Results for orders placed or performed during the hospital encounter of 04/13/20 (from the past 24 hour(s))  CBC with Differential/Platelet     Status: Abnormal   Collection Time: 04/22/20  6:15 AM  Result Value Ref Range   WBC 9.0 4.0 - 10.5 K/uL   RBC 4.41 3.87 - 5.11 MIL/uL   Hemoglobin 12.2 12.0 - 15.0 g/dL   HCT 36.3 36 - 46 %   MCV 82.3 80.0 - 100.0 fL   MCH 27.7 26.0 - 34.0 pg   MCHC 33.6 30.0 - 36.0 g/dL   RDW 14.1 11.5 - 15.5 %   Platelets 249 150 - 400 K/uL   nRBC 0.0 0.0 - 0.2 %   Neutrophils Relative % 91 %   Neutro Abs  8.1 (H) 1.7 - 7.7 K/uL   Lymphocytes Relative 2 %   Lymphs Abs 0.2 (L) 0.7 - 4.0 K/uL   Monocytes Relative 6 %   Monocytes Absolute 0.6 0 - 1 K/uL   Eosinophils Relative 0 %   Eosinophils Absolute 0.0 0 - 0 K/uL   Basophils Relative 0 %   Basophils Absolute 0.0 0 - 0 K/uL   Immature Granulocytes 1 %   Abs Immature Granulocytes 0.05 0.00 - 0.07 K/uL  Comprehensive metabolic panel     Status: Abnormal   Collection Time: 04/22/20  6:15 AM  Result Value Ref Range   Sodium 132 (L) 135 - 145 mmol/L   Potassium 3.9 3.5 - 5.1 mmol/L   Chloride 101 98 - 111 mmol/L   CO2 18 (L) 22 - 32 mmol/L   Glucose, Bld 107 (H) 70 - 99 mg/dL   BUN 38 (H) 8 - 23  mg/dL   Creatinine, Ser 1.46 (H) 0.44 - 1.00 mg/dL   Calcium 8.3 (L) 8.9 - 10.3 mg/dL   Total Protein 5.7 (L) 6.5 - 8.1 g/dL   Albumin 2.8 (L) 3.5 - 5.0 g/dL   AST 32 15 - 41 U/L   ALT 15 0 - 44 U/L   Alkaline Phosphatase 46 38 - 126 U/L   Total Bilirubin 0.7 0.3 - 1.2 mg/dL   GFR calc non Af Amer 37 (L) >60 mL/min   GFR calc Af Amer 43 (L) >60 mL/min   Anion gap 13 5 - 15  Magnesium     Status: None   Collection Time: 04/22/20  6:15 AM  Result Value Ref Range   Magnesium 2.2 1.7 - 2.4 mg/dL    Recent Results (from the past 240 hour(s))  Urine culture     Status: Abnormal   Collection Time: 04/13/20  1:20 PM   Specimen: Urine, Random  Result Value Ref Range Status   Specimen Description   Final    URINE, RANDOM Performed at [facility], [place] [address], [place], [place] [address]    Special Requests   Final    NONE Performed at [facility], [place] [address] [place], [place] [address]    Culture (A)  Final    20,000 COLONIES/mL MULTIPLE SPECIES PRESENT, SUGGEST RECOLLECTION   Report Status 04/14/2020 FINAL  Final  SARS Coronavirus 2 by RT PCR (hospital order, performed in [facility] hospital lab) Nasopharyngeal Nasopharyngeal Swab     Status: None   Collection Time: 04/13/20  2:13 PM   Specimen: Nasopharyngeal Swab  Result Value Ref Range Status   SARS Coronavirus 2 NEGATIVE NEGATIVE Final    Comment: (NOTE) SARS-CoV-2 target nucleic acids are NOT DETECTED.  The SARS-CoV-2 RNA is generally detectable in upper and lower respiratory specimens during the acute phase of infection. The lowest concentration of SARS-CoV-2 viral copies this assay can detect is 250 copies / mL. A negative result does not preclude SARS-CoV-2 infection and should not be used as the sole basis for treatment or other patient management decisions.  A negative result may occur with improper specimen collection / handling, submission of specimen other than  nasopharyngeal swab, presence of viral mutation(s) within the areas targeted by this assay, and inadequate number of viral copies (<250 copies / mL). A negative result must be combined with clinical observations, patient history, and epidemiological information.  Fact Sheet for Patients:   StrictlyIdeas.no  Fact Sheet for Healthcare Providers: BankingDealers.co.za  This test is not  yet approved or  cleared by the [place] and has been authorized for detection and/or diagnosis of SARS-CoV-2 by FDA under an Emergency Use Authorization (EUA).  This EUA will remain in effect (meaning this test can be used) for the duration of the COVID-19 declaration under Section 564(b)(1) of the Act, 21 U.S.C. section 360bbb-3(b)(1), unless the authorization is terminated or revoked sooner.  Performed at [facility] [place] [address] [place], [place] [address]   Culture, blood (Routine X 2) w Reflex to ID Panel     Status: None   Collection Time: 04/13/20  6:28 PM   Specimen: BLOOD  Result Value Ref Range Status   Specimen Description   Final    BLOOD RIGHT ANTECUBITAL Performed at [facility], [place] [address] [place], [place] [address]    Special Requests   Final    BOTTLES DRAWN AEROBIC AND ANAEROBIC Blood Culture adequate volume Performed at [place] [address] [place], [place] [address]    Culture   Final    NO GROWTH 5 DAYS Performed at [facility], [place] [address] [place], [place] [address]    Report Status 04/18/2020 FINAL  Final  Culture, blood (Routine X 2) w Reflex to ID Panel     Status: None   Collection Time: 04/13/20  6:40 PM   Specimen: BLOOD RIGHT HAND  Result Value Ref Range Status   Specimen Description   Final    BLOOD RIGHT HAND Performed at [place] [address], [place], [place] [address]    Special Requests   Final     BOTTLES DRAWN AEROBIC AND ANAEROBIC Blood Culture adequate volume Performed at [place] [address] [place], [place] [address]    Culture   Final    NO GROWTH 5 DAYS Performed at [facility], [place] [address], [place], [place] [address]    Report Status 04/18/2020 FINAL  Final  MRSA PCR Screening     Status: None   Collection Time: 04/15/20  3:04 PM   Specimen: Nasal Mucosa; Nasopharyngeal  Result Value Ref Range Status   MRSA by PCR NEGATIVE NEGATIVE Final    Comment:        The GeneXpert MRSA Assay (FDA approved for NASAL specimens only), is one component of a comprehensive MRSA colonization surveillance program. It is not intended to diagnose MRSA infection nor to guide or monitor treatment for MRSA infections. Performed at [facility], [place] [address], [place],  [address]      Radiology Studies: DG Chest 1 View  Result Date: 04/20/2020 CLINICAL DATA:  Increasing short of breath EXAM: CHEST  1 VIEW COMPARISON:  04/13/2020, CT 04/15/2020 FINDINGS: Right upper extremity central venous catheter tip over the SVC. Cardiomegaly with vascular congestion and diffuse interstitial opacity consistent with edema. Moderate right-sided pleural effusion, likely layering and resulting in hazy asymmetric appearance of the right thorax. At least small left pleural effusion. Bibasilar consolidations. No pneumothorax. IMPRESSION: 1. Cardiomegaly with vascular congestion and diffuse interstitial pulmonary edema. 2. Bilateral pleural effusions, at least moderate on the right and small on the left. 3. Bibasilar consolidations Electronically Signed   By: Donavan Foil M.D.   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 Hypoactive bowel sounds. EXAM: PORTABLE ABDOMEN - 2 VIEW COMPARISON:  KUB 04/20/2020.  CT Abdomen and Pelvis  04/14/2020. FINDINGS: Upright and supine views of the abdomen and pelvis. Stable right double-J ureteral stent. Mildly to moderately gas distended large bowel again noted, with gas present to the mid sigmoid. As before, no dilated small bowel loops. But gas is increased since 04/14/2020. No pneumoperitoneum. Stable lung bases, right pleural effusion again evident. Stable visualized osseous structures. IMPRESSION: 1. Continued gas distended colon to the sigmoid, although no dilated small bowel to strongly suggest mechanical obstruction. Continue to favor ileus. 2. No free air. Stable right ureteral stent. Persistent right pleural effusion. Electronically Signed   By: Genevie Ann M.D.   On: 04/21/2020 13:54   DG Abd Portable 2V  Final Result    DG Chest 1 View  Final Result    DG Abd Portable 2V  Final Result    [place] EKG SITE RITE  Final Result    DG C-Arm 1-60 Min-No Report  Final Result    CT CHEST W CONTRAST  Final Result    CT RENAL STONE STUDY  Final Result    DG Chest 2 View  Final Result    DG Abd Portable 2V    (Results Pending)     Scheduled Meds: . Chlorhexidine Gluconate Cloth  6 each Topical Daily  . heparin  5,000 Units Subcutaneous Q8H  . metoprolol succinate  25 mg Oral Daily  . pantoprazole  40 mg Oral Daily  . polyethylene glycol  17 g Oral Daily  . senna-docusate  2 tablet Oral QHS  . sodium bicarbonate  650 mg Oral BID  . sodium chloride flush  10-40 mL Intracatheter Q12H   PRN Meds: acetaminophen **OR** acetaminophen, alteplase, alum & mag hydroxide-simeth, heparin lock flush, heparin lock flush, hydrALAZINE, ipratropium-albuterol, nitroGLYCERIN, ondansetron **OR** ondansetron (ZOFRAN) IV, phenol, prochlorperazine, simethicone, sodium chloride flush, sodium chloride flush, sodium chloride flush Continuous Infusions: . sodium chloride        LOS: 8 days  Time spent: Greater than 50% of the 35 minute visit was spent in counseling/coordination of care for the  patient as laid out in the A&P.   Dwyane Dee, MD [facility] 04/22/2020, 2:00 PM   Contact via secure chat.  To contact the attending provider between 7A-7P or the covering provider during after hours 7P-7A, please log into the web site [email or web address] and access using universal [place] password for that web site. If you do not have the password, please call the hospital operator.

## 2020-04-23 DIAGNOSIS — K567 Ileus, unspecified: Secondary | ICD-10-CM

## 2020-04-23 LAB — CBC WITH DIFFERENTIAL/PLATELET
Abs Immature Granulocytes: 0.05 10*3/uL (ref 0.00–0.07)
Basophils Absolute: 0 10*3/uL (ref 0.0–0.1)
Basophils Relative: 0 %
Eosinophils Absolute: 0 10*3/uL (ref 0.0–0.5)
Eosinophils Relative: 0 %
HCT: 34.9 % — ABNORMAL LOW (ref 36.0–46.0)
Hemoglobin: 11.6 g/dL — ABNORMAL LOW (ref 12.0–15.0)
Immature Granulocytes: 1 %
Lymphocytes Relative: 4 %
Lymphs Abs: 0.3 10*3/uL — ABNORMAL LOW (ref 0.7–4.0)
MCH: 27.4 pg (ref 26.0–34.0)
MCHC: 33.2 g/dL (ref 30.0–36.0)
MCV: 82.5 fL (ref 80.0–100.0)
Monocytes Absolute: 0.2 10*3/uL (ref 0.1–1.0)
Monocytes Relative: 2 %
Neutro Abs: 7.1 10*3/uL (ref 1.7–7.7)
Neutrophils Relative %: 93 %
Platelets: 192 10*3/uL (ref 150–400)
RBC: 4.23 MIL/uL (ref 3.87–5.11)
RDW: 14.3 % (ref 11.5–15.5)
WBC: 7.6 10*3/uL (ref 4.0–10.5)
nRBC: 0 % (ref 0.0–0.2)

## 2020-04-23 LAB — MAGNESIUM: Magnesium: 2.2 mg/dL (ref 1.7–2.4)

## 2020-04-23 LAB — COMPREHENSIVE METABOLIC PANEL
ALT: 15 U/L (ref 0–44)
AST: 26 U/L (ref 15–41)
Albumin: 2.5 g/dL — ABNORMAL LOW (ref 3.5–5.0)
Alkaline Phosphatase: 42 U/L (ref 38–126)
Anion gap: 11 (ref 5–15)
BUN: 40 mg/dL — ABNORMAL HIGH (ref 8–23)
CO2: 21 mmol/L — ABNORMAL LOW (ref 22–32)
Calcium: 8.3 mg/dL — ABNORMAL LOW (ref 8.9–10.3)
Chloride: 101 mmol/L (ref 98–111)
Creatinine, Ser: 1.4 mg/dL — ABNORMAL HIGH (ref 0.44–1.00)
GFR calc Af Amer: 45 mL/min — ABNORMAL LOW (ref >60)
GFR calc non Af Amer: 39 mL/min — ABNORMAL LOW (ref >60)
Glucose, Bld: 99 mg/dL (ref 70–99)
Potassium: 4.2 mmol/L (ref 3.5–5.1)
Sodium: 133 mmol/L — ABNORMAL LOW (ref 135–145)
Total Bilirubin: 0.8 mg/dL (ref 0.3–1.2)
Total Protein: 5.5 g/dL — ABNORMAL LOW (ref 6.5–8.1)

## 2020-04-23 MED ORDER — LACTATED RINGERS IV SOLN: INTRAVENOUS | Status: AC

## 2020-04-23 NOTE — Progress Notes (Signed)
PROGRESS NOTE    Eileen Burgess   JKK:[id number]  DOB: November 29, 1955  DOA: 04/13/2020     9  PCP: Eileen Burgess, No Pcp Per  CC: diarrhea  Hospital Course: Eileen Burgess is a 64 y.o. female with PMH CAD s/p angioplasty to the LAD, hypertension, history of tobacco abuse she is currently non-smoking, GERD that came to the hospital due to 3 weeks of episodic diarrhea that has progressively gotten worse, she has not received any antibiotics over the last year, she is also been nauseated with loss of appetite and decreased oral intake despite this she has continued to take her antihypertensive medication, she does relate that on the day of admission she started having some dysuria, she denies any  urgency frequency hematuria or hematochezia.  She relates some dizziness upon standing she relates she received a Covid vaccine more than 8 months ago.  ED Course: Afebrile,Vital signs are stable- saturations are greater than 97%, she was found to be hyponatremic, new acute renal failure with elevated AST and ALT unremarkable, UA  positive for bacteria and hemoglobin large, WBC/RBC>  50.  She underwent a CT renal stone protocol which revealed an underlying malignancy involving the cervix and invading into the posterior bladder wall with obstruction of the right UVJ or distal right ureter with right hydronephrosis.  Urology was consulted and she has since undergone a ureteral stent on the right side due to ureteral obstruction.   She was also evaluated by oncology and underwent cervical biopsy.  Frozen section and formal pathology are both consistent with adenocarcinoma.  She underwent PICC line placement on 04/19/2020.   On 8/19, she underwent first dose of chemo and tolerated well. Later that night she developed respiratory distress due to volume overload from IVF and a CXR showed vascular congestion. IVF were discontinued and she was started on lasix.   She continued to have abdominal findings concerning for ileus  however was not very symptomatic. Serial abdominal xrays showed ongoing colonic gaseous distension.   Interval History:  No events overnight.   Still no flatus or BM. Abdomen remains distended and firm. Now starting to "belch" and it "smells terrible". No N/V yet.  Oral intake has been what appears to be minimal as well for over a week now. We discussed possible need for TPN at this point.  Breathing is comfortable; discussed need for IVF again also given decreased intake; she is okay trying low rate IVF for a little until decision made about TPN.   Old records reviewed in assessment of this Eileen Burgess  ROS: Constitutional: positive for fatigue, Respiratory: negative for cough, Cardiovascular: negative for chest pain and Gastrointestinal: positive for abdominal pain  Assessment & Plan: Abdominal pain with dysuria, AKI, right hydronephrosis  Metastatic omental carcinomatosis.  Cervical cancer- new diagnosis Bowel obstruction vs ileus -CT abdomen pelvis showed possible neoplastic processes arising from the bladder urothelium or from cervix and invading into the posterior bladder wall with obstruction of right UVJ or distal right ureter, moderate right hydronephrosis, diffuse omental nodularity and caking consistent with metastatic disease. Moderate right and small left pleural effusions, associated partial compressive atelectasis of lower lobes.  Discussed with the Eileen Burgess, no prior history of malignancy (just had a history of ovarian cyst at the age of 41 years). -Urology consulted, s/p right ureteral stent placement on 8/14.  Biopsy of the posterior bladder with intraoperative findings suggest extrinsic mass invading bladder rather than primary bladder malignancy.   -CT chest with contrast showed small pleural  effusion left, moderate sized right pleural effusion, moderate amount of abdominal free fluid, moderate to marked severe right-sided hydronephrosis.  No lung lesions. - frozen section  consistent with adenocarcinoma; final path also shows adenocarcinoma from cervical biopsy -Received infusion of carboplatin/paclitaxel on 04/20/2020; PICC line placed 04/19/20  -Having minimal flatus but still no bowel movement. Abdomen still distended with minimal bowel sounds.  Continue serial abdominal x-rays, appears to be underlying ileus at this time.  Continue on liquid diet, do not advance.  If becomes nauseous, will make n.p.o. and place NG tube  - now is >7 days without adequate nutrition with no signs that ileus is improving and does not appear safe to advance diet further at this time; will have RD weigh in on intake, possible calorie count; may start TPN very soon depending on this as Eileen Burgess is not likely to improve without adequate nutrition as well   Respiratory distress - resolved  - s/p rapid response evening of 04/20/20 - likely volume overload from multiple days of IVF -Diuresed well with Lasix on 04/21/2020.  Renal function mildly worsened, hold off on further diuresis at this time - re-discussed need for IVF for now given such poor oral intake; will try low rate IVF for now while we address need for TPN   Hyponatremia, dehydration -Appears to be hypovolemic related to recent diarrhea and poor p.o. intake, HCTZ use vs SIADH from ?malignancy.  Serum osmolality 268, urine osmolality 617, urine sodium 50 - overall stable Na  Acute on CKDIII - baseline creat appears to be 1.1, but GFR in 2018 mid 50s -In the setting of #1, dehydration, HCTZ use -Continue to hold lisinopril, HCTZ -Status post ureteral stent  Hypertension -BP stable, continue metoprolol  - continue metoprolol, avoid diuretics, ACE inhibitor  CAD status post PTCA Continue metoprolol, aspirin  GERD Continue PPI  Obesity Estimated body mass index is 37.09 kg/m as calculated from the following:   Height as of this encounter: 5\' 4"  (1.626 m).   Weight as of this encounter: 98  kg.  Antimicrobials: none  DVT prophylaxis: HSQ Code Status: Full Family Communication: none present Disposition Plan:  Status is: Inpatient  Remains inpatient appropriate because:Unsafe d/c plan, IV treatments appropriate due to intensity of illness or inability to take PO and Inpatient level of care appropriate due to severity of illness   Dispo: The Eileen Burgess is from: Home              Anticipated d/c is to: Home              Anticipated d/c date is: > 3 days              Eileen Burgess currently is not medically stable to d/c.  Objective: Blood pressure (!) 111/50, pulse 97, temperature (!) 97.5 F (36.4 C), temperature source Oral, resp. rate 16, height 5\' 4"  (1.626 m), weight 98 kg, SpO2 97 %.  Examination: General appearance: alert, cooperative, fatigued and no distress Head: Normocephalic, without obvious abnormality, atraumatic Eyes: EOMI Lungs: clear to auscultation bilaterally Heart: regular rate and rhythm and S1, S2 normal Abdomen: firm, more distended compared to previous; NT; BS very hypoactive Extremities: no UE edema Skin: mobility and turgor normal Neurologic: Grossly normal  Consultants:   Gyn oncology  Medical oncology  Data Reviewed: I have personally reviewed following labs and imaging studies Results for orders placed or performed during the hospital encounter of 04/13/20 (from the past 24 hour(s))  CBC with Differential/Platelet  Status: Abnormal   Collection Time: 04/23/20  5:22 AM  Result Value Ref Range   WBC 7.6 4.0 - 10.5 K/uL   RBC 4.23 3.87 - 5.11 MIL/uL   Hemoglobin 11.6 (L) 12.0 - 15.0 g/dL   HCT 34.9 (L) 36 - 46 %   MCV 82.5 80.0 - 100.0 fL   MCH 27.4 26.0 - 34.0 pg   MCHC 33.2 30.0 - 36.0 g/dL   RDW 14.3 11.5 - 15.5 %   Platelets 192 150 - 400 K/uL   nRBC 0.0 0.0 - 0.2 %   Neutrophils Relative % 93 %   Neutro Abs 7.1 1.7 - 7.7 K/uL   Lymphocytes Relative 4 %   Lymphs Abs 0.3 (L) 0.7 - 4.0 K/uL   Monocytes Relative 2 %   Monocytes  Absolute 0.2 0 - 1 K/uL   Eosinophils Relative 0 %   Eosinophils Absolute 0.0 0 - 0 K/uL   Basophils Relative 0 %   Basophils Absolute 0.0 0 - 0 K/uL   Immature Granulocytes 1 %   Abs Immature Granulocytes 0.05 0.00 - 0.07 K/uL  Comprehensive metabolic panel     Status: Abnormal   Collection Time: 04/23/20  5:22 AM  Result Value Ref Range   Sodium 133 (L) 135 - 145 mmol/L   Potassium 4.2 3.5 - 5.1 mmol/L   Chloride 101 98 - 111 mmol/L   CO2 21 (L) 22 - 32 mmol/L   Glucose, Bld 99 70 - 99 mg/dL   BUN 40 (H) 8 - 23 mg/dL   Creatinine, Ser 1.40 (H) 0.44 - 1.00 mg/dL   Calcium 8.3 (L) 8.9 - 10.3 mg/dL   Total Protein 5.5 (L) 6.5 - 8.1 g/dL   Albumin 2.5 (L) 3.5 - 5.0 g/dL   AST 26 15 - 41 U/L   ALT 15 0 - 44 U/L   Alkaline Phosphatase 42 38 - 126 U/L   Total Bilirubin 0.8 0.3 - 1.2 mg/dL   GFR calc non Af Amer 39 (L) >60 mL/min   GFR calc Af Amer 45 (L) >60 mL/min   Anion gap 11 5 - 15  Magnesium     Status: None   Collection Time: 04/23/20  5:22 AM  Result Value Ref Range   Magnesium 2.2 1.7 - 2.4 mg/dL    Recent Results (from the past 240 hour(s))  Urine culture     Status: Abnormal   Collection Time: 04/13/20  1:20 PM   Specimen: Urine, Random  Result Value Ref Range Status   Specimen Description   Final    URINE, RANDOM Performed at [facility], [place] [address] [place], [place] [address]    Special Requests   Final    NONE Performed at [facility], [place] [address] [place], [place] [address]    Culture (A)  Final    20,000 COLONIES/mL MULTIPLE SPECIES PRESENT, SUGGEST RECOLLECTION   Report Status 04/14/2020 FINAL  Final  SARS Coronavirus 2 by RT PCR (hospital order, performed in [facility] hospital lab) Nasopharyngeal Nasopharyngeal Swab     Status: None   Collection Time: 04/13/20  2:13 PM   Specimen: Nasopharyngeal Swab  Result Value Ref Range Status   SARS Coronavirus 2 NEGATIVE NEGATIVE Final    Comment:  (NOTE) SARS-CoV-2 target nucleic acids are NOT DETECTED.  The SARS-CoV-2 RNA is generally detectable in upper and lower respiratory specimens during the acute phase of infection. The lowest concentration of SARS-CoV-2 viral copies this  assay can detect is 250 copies / mL. A negative result does not preclude SARS-CoV-2 infection and should not be used as the sole basis for treatment or other Eileen Burgess management decisions.  A negative result may occur with improper specimen collection / handling, submission of specimen other than nasopharyngeal swab, presence of viral mutation(s) within the areas targeted by this assay, and inadequate number of viral copies (<250 copies / mL). A negative result must be combined with clinical observations, Eileen Burgess history, and epidemiological information.  Fact Sheet for Patients:   StrictlyIdeas.no  Fact Sheet for Healthcare Providers: BankingDealers.co.za  This test is not yet approved or  cleared by the [place] FDA and has been authorized for detection and/or diagnosis of SARS-CoV-2 by FDA under an Emergency Use Authorization (EUA).  This EUA will remain in effect (meaning this test can be used) for the duration of the COVID-19 declaration under Section 564(b)(1) of the Act, 21 U.S.C. section 360bbb-3(b)(1), unless the authorization is terminated or revoked sooner.  Performed at [facility], [place] [address], [place], [place] [address]   Culture, blood (Routine X 2) w Reflex to ID Panel     Status: None   Collection Time: 04/13/20  6:28 PM   Specimen: BLOOD  Result Value Ref Range Status   Specimen Description   Final    BLOOD RIGHT ANTECUBITAL Performed at [facility], [place] [address] [place], [place] [address]    Special Requests   Final    BOTTLES DRAWN AEROBIC AND ANAEROBIC Blood Culture adequate volume Performed at [place]  [address], [place], [place] [address]    Culture   Final    NO GROWTH 5 DAYS Performed at [facility], [place] [address], [place], [place] [address]    Report Status 04/18/2020 FINAL  Final  Culture, blood (Routine X 2) w Reflex to ID Panel     Status: None   Collection Time: 04/13/20  6:40 PM   Specimen: BLOOD RIGHT HAND  Result Value Ref Range Status   Specimen Description   Final    BLOOD RIGHT HAND Performed at [place] [address] [place], [place] [address]    Special Requests   Final    BOTTLES DRAWN AEROBIC AND ANAEROBIC Blood Culture adequate volume Performed at [place] [address], [place], [place] [address]    Culture   Final    NO GROWTH 5 DAYS Performed at [facility], [place] [address] [place], [place] [address]    Report Status 04/18/2020 FINAL  Final  MRSA PCR Screening     Status: None   Collection Time: 04/15/20  3:04 PM   Specimen: Nasal Mucosa; Nasopharyngeal  Result Value Ref Range Status   MRSA by PCR NEGATIVE NEGATIVE Final    Comment:        The GeneXpert MRSA Assay (FDA approved for NASAL specimens only), is one component of a comprehensive MRSA colonization surveillance program. It is not intended to diagnose MRSA infection nor to guide or monitor treatment for MRSA infections. Performed at [facility], [place] [address], [place], [place] [address]      Radiology Studies: DG Abd Portable 2V  Result Date: 04/22/2020 CLINICAL DATA:  Decreased bowel sounds EXAM: PORTABLE ABDOMEN - 2 VIEW COMPARISON:  04/21/2020 FINDINGS: Supine frontal views of the abdomen and pelvis are obtained. Right ureteral stent is stable. There is increasing gaseous distention  of the colon, most compatible with ileus. No evidence of small-bowel obstruction. No masses or abnormal calcifications. IMPRESSION: 1. Progressive gaseous distention of the colon most consistent with ileus. 2. Stable right  ureteral stent. Electronically Signed   By: Randa Ngo M.D.   On: 04/22/2020 15:25   DG Abd Portable 2V  Final Result    DG Abd Portable 2V  Final Result    DG Chest 1 View  Final Result    DG Abd Portable 2V  Final Result    [place] EKG SITE RITE  Final Result    DG C-Arm 1-60 Min-No Report  Final Result    CT CHEST W CONTRAST  Final Result    CT RENAL STONE STUDY  Final Result    DG Chest 2 View  Final Result       Scheduled Meds: . Chlorhexidine Gluconate Cloth  6 each Topical Daily  . heparin  5,000 Units Subcutaneous Q8H  . metoprolol succinate  25 mg Oral Daily  . pantoprazole  40 mg Oral Daily  . polyethylene glycol  17 g Oral Daily  . senna-docusate  2 tablet Oral QHS  . sodium bicarbonate  650 mg Oral BID  . sodium chloride flush  10-40 mL Intracatheter Q12H   PRN Meds: acetaminophen **OR** acetaminophen, alteplase, alum & mag hydroxide-simeth, heparin lock flush, heparin lock flush, hydrALAZINE, ipratropium-albuterol, nitroGLYCERIN, ondansetron **OR** ondansetron (ZOFRAN) IV, phenol, prochlorperazine, simethicone, sodium chloride flush, sodium chloride flush, sodium chloride flush Continuous Infusions: . sodium chloride    . lactated ringers        LOS: 9 days  Time spent: Greater than 50% of the 35 minute visit was spent in counseling/coordination of care for the Eileen Burgess as laid out in the A&P.   Dwyane Dee, MD [facility] 04/23/2020, 12:56 PM   Contact via secure chat.  To contact the attending provider between 7A-7P or the covering provider during after hours 7P-7A, please log into the web site [email or web address] and access using universal [place] password for that web site. If you do not have the password, please call the hospital operator.

## 2020-04-24 ENCOUNTER — Other Ambulatory Visit: Payer: Self-pay | Admitting: Oncology

## 2020-04-24 LAB — CBC WITH DIFFERENTIAL/PLATELET
Abs Immature Granulocytes: 0.02 10*3/uL (ref 0.00–0.07)
Basophils Absolute: 0 10*3/uL (ref 0.0–0.1)
Basophils Relative: 0 %
Eosinophils Absolute: 0.1 10*3/uL (ref 0.0–0.5)
Eosinophils Relative: 2 %
HCT: 34.9 % — ABNORMAL LOW (ref 36.0–46.0)
Hemoglobin: 11.6 g/dL — ABNORMAL LOW (ref 12.0–15.0)
Immature Granulocytes: 0 %
Lymphocytes Relative: 7 %
Lymphs Abs: 0.4 10*3/uL — ABNORMAL LOW (ref 0.7–4.0)
MCH: 27.9 pg (ref 26.0–34.0)
MCHC: 33.2 g/dL (ref 30.0–36.0)
MCV: 83.9 fL (ref 80.0–100.0)
Monocytes Absolute: 0.1 10*3/uL (ref 0.1–1.0)
Monocytes Relative: 2 %
Neutro Abs: 5.2 10*3/uL (ref 1.7–7.7)
Neutrophils Relative %: 89 %
Platelets: 154 10*3/uL (ref 150–400)
RBC: 4.16 MIL/uL (ref 3.87–5.11)
RDW: 14.2 % (ref 11.5–15.5)
WBC: 5.9 10*3/uL (ref 4.0–10.5)
nRBC: 0 % (ref 0.0–0.2)

## 2020-04-24 LAB — COMPREHENSIVE METABOLIC PANEL
ALT: 16 U/L (ref 0–44)
AST: 24 U/L (ref 15–41)
Albumin: 2.6 g/dL — ABNORMAL LOW (ref 3.5–5.0)
Alkaline Phosphatase: 40 U/L (ref 38–126)
Anion gap: 13 (ref 5–15)
BUN: 41 mg/dL — ABNORMAL HIGH (ref 8–23)
CO2: 21 mmol/L — ABNORMAL LOW (ref 22–32)
Calcium: 8.4 mg/dL — ABNORMAL LOW (ref 8.9–10.3)
Chloride: 102 mmol/L (ref 98–111)
Creatinine, Ser: 1.29 mg/dL — ABNORMAL HIGH (ref 0.44–1.00)
GFR calc Af Amer: 50 mL/min — ABNORMAL LOW (ref >60)
GFR calc non Af Amer: 43 mL/min — ABNORMAL LOW (ref >60)
Glucose, Bld: 99 mg/dL (ref 70–99)
Potassium: 4.3 mmol/L (ref 3.5–5.1)
Sodium: 136 mmol/L (ref 135–145)
Total Bilirubin: 1 mg/dL (ref 0.3–1.2)
Total Protein: 5.4 g/dL — ABNORMAL LOW (ref 6.5–8.1)

## 2020-04-24 LAB — GLUCOSE, CAPILLARY
Glucose-Capillary: 92 mg/dL (ref 70–99)
Glucose-Capillary: 97 mg/dL (ref 70–99)

## 2020-04-24 LAB — PHOSPHORUS: Phosphorus: 2.9 mg/dL (ref 2.5–4.6)

## 2020-04-24 LAB — MAGNESIUM: Magnesium: 2.1 mg/dL (ref 1.7–2.4)

## 2020-04-24 MED ORDER — BOOST / RESOURCE BREEZE PO LIQD CUSTOM
1.0000 | Freq: Three times a day (TID) | ORAL | Status: DC
  Administered 2020-04-24: 1 via ORAL

## 2020-04-24 MED ORDER — TRAVASOL 10 % IV SOLN
INTRAVENOUS | Status: AC
  Filled 2020-04-24: qty 403.2

## 2020-04-24 MED ORDER — INSULIN ASPART 100 UNIT/ML ~~LOC~~ SOLN
0.0000 [IU] | SUBCUTANEOUS | Status: DC
  Administered 2020-04-25 (×3): 1 [IU] via SUBCUTANEOUS

## 2020-04-24 NOTE — Plan of Care (Signed)
Pt VS WNL this am.  No complaints at this time.   Problem: Education: Goal: Knowledge of General Education information will improve Description: Including pain rating scale, medication(s)/side effects and non-pharmacologic comfort measures Outcome: Progressing   Problem: Health Behavior/Discharge Planning: Goal: Ability to manage health-related needs will improve Outcome: Progressing   Problem: Clinical Measurements: Goal: Ability to maintain clinical measurements within normal limits will improve Outcome: Progressing Goal: Will remain free from infection Outcome: Progressing Goal: Diagnostic test results will improve Outcome: Progressing Goal: Respiratory complications will improve Outcome: Progressing Goal: Cardiovascular complication will be avoided Outcome: Progressing   Problem: Activity: Goal: Risk for activity intolerance will decrease Outcome: Progressing   Problem: Coping: Goal: Level of anxiety will decrease Outcome: Progressing   Problem: Elimination: Goal: Will not experience complications related to bowel motility Outcome: Progressing Goal: Will not experience complications related to urinary retention Outcome: Progressing   Problem: Pain Managment: Goal: General experience of comfort will improve Outcome: Progressing   Problem: Safety: Goal: Ability to remain free from injury will improve Outcome: Progressing   Problem: Skin Integrity: Goal: Risk for impaired skin integrity will decrease Outcome: Progressing   Problem: Nutrition: Goal: Adequate nutrition will be maintained Outcome: Not Progressing

## 2020-04-24 NOTE — Progress Notes (Signed)
PROGRESS NOTE    Eileen Burgess   ZHG:[id number]  DOB: [birth year]  DOA: 04/13/2020     10  PCP: Patient, No Pcp Per  CC: diarrhea  Hospital Course: Eileen Burgess is a 64 y.o. female with PMH CAD s/p angioplasty to the LAD, hypertension, history of tobacco abuse she is currently non-smoking, GERD that came to the hospital due to 3 weeks of episodic diarrhea that has progressively gotten worse, she has not received any antibiotics over the last year, she is also been nauseated with loss of appetite and decreased oral intake despite this she has continued to take her antihypertensive medication, she does relate that on the day of admission she started having some dysuria, she denies any  urgency frequency hematuria or hematochezia.  She relates some dizziness upon standing she relates she received a Covid vaccine more than 8 months ago. ED Course: Afebrile,Vital signs are stable- saturations are greater than 97%, she was found to be hyponatremic, new acute renal failure with elevated AST and ALT unremarkable, UA  positive for bacteria and hemoglobin large, WBC/RBC>  50.  She underwent a CT renal stone protocol which revealed an underlying malignancy involving the cervix and invading into the posterior bladder wall with obstruction of the right UVJ or distal right ureter with right hydronephrosis.  Urology was consulted and she has since undergone a ureteral stent on the right side due to ureteral obstruction.   She was also evaluated by oncology and underwent cervical biopsy.  Frozen section and formal pathology are both consistent with adenocarcinoma.  She underwent PICC line placement on 04/19/2020.   On 8/19, she underwent first dose of chemo and tolerated well. Later that night she developed respiratory distress due to volume overload from IVF and a CXR showed vascular congestion. IVF were discontinued and she was started on lasix.   She continued to have abdominal findings concerning for ileus  however was not very symptomatic. Serial abdominal xrays showed ongoing colonic gaseous distension.  Due to prolonged ileus and no advancement of her diet, she was started on TPN on 04/24/2020. Gyn onc recommends G-tube placement if she starts to develop nausea or vomiting.  Interval History:  No events overnight.   Still not having any flatus or bowel movements.  She has been getting up out of the bed now to the chair for meals at least.  Still only drinking small amounts of clear liquids.  Due to prolonged (greater than 10 days) time without adequate nutritional intake, starting her on TPN today.  Old records reviewed in assessment of this patient  ROS: Constitutional: positive for fatigue, Respiratory: negative for cough, Cardiovascular: negative for chest pain and Gastrointestinal: positive for abdominal pain  Assessment & Plan: Abdominal pain with dysuria, AKI, right hydronephrosis  Metastatic omental carcinomatosis.  Cervical cancer- new diagnosis Bowel obstruction vs ileus -CT abdomen pelvis showed possible neoplastic processes arising from the bladder urothelium or from cervix and invading into the posterior bladder wall with obstruction of right UVJ or distal right ureter, moderate right hydronephrosis, diffuse omental nodularity and caking consistent with metastatic disease. Moderate right and small left pleural effusions, associated partial compressive atelectasis of lower lobes.  Discussed with the patient, no prior history of malignancy (just had a history of ovarian cyst at the age of 60 years). -Urology consulted, s/p right ureteral stent placement on 8/14.  Biopsy of the posterior bladder with intraoperative findings suggest extrinsic mass invading bladder rather than primary bladder malignancy.   -CT  chest with contrast showed small pleural effusion left, moderate sized right pleural effusion, moderate amount of abdominal free fluid, moderate to marked severe right-sided  hydronephrosis.  No lung lesions. - frozen section consistent with adenocarcinoma; final path also shows adenocarcinoma from cervical biopsy -Received infusion of carboplatin/paclitaxel on 04/20/2020; PICC line placed 04/19/20  -Having minimal flatus but still no bowel movement. Abdomen still distended with minimal bowel sounds.   - Continue abdominal x-rays as needed, appears to be underlying ileus at this time.   - Continue on liquid diet, do not advance.  If becomes nauseous, will make n.p.o. and per GYN onc would need PEG if this occurs - TPN ordered on 04/24/20 due to prolonged poor nutrition (>10 days)  Respiratory distress - resolved  - s/p rapid response evening of 04/20/20 - likely volume overload from multiple days of IVF -Diuresed well with Lasix on 04/21/2020.  Renal function mildly worsened, hold off on further diuresis at this time - re-discussed need for IVF for now given such poor oral intake; given IVF on 04/23/20; now with TPN being started, hopefully does not need further IVF  Hyponatremia, dehydration -Appears to be hypovolemic related to recent diarrhea and poor p.o. intake, HCTZ use vs SIADH from ?malignancy.  Serum osmolality 268, urine osmolality 617, urine sodium 50 - overall stable Na  Acute on CKDIII - baseline creat appears to be 1.1, but GFR in 2018 mid 50s -In the setting of #1, dehydration, HCTZ use -Continue to hold lisinopril, HCTZ -Status post ureteral stent  Hypertension -BP stable, continue metoprolol  - continue metoprolol, avoid diuretics, ACE inhibitor  CAD status post PTCA Continue metoprolol, aspirin  GERD Continue PPI  Obesity Estimated body mass index is 37.09 kg/m as calculated from the following:   Height as of this encounter: 5\' 4"  (1.626 m).   Weight as of this encounter: 98 kg.  Antimicrobials: none  DVT prophylaxis: HSQ Code Status: Full Family Communication: none present Disposition Plan:  Status is: Inpatient  Remains  inpatient appropriate because:Unsafe d/c plan, IV treatments appropriate due to intensity of illness or inability to take PO and Inpatient level of care appropriate due to severity of illness   Dispo: The patient is from: Home              Anticipated d/c is to: Home              Anticipated d/c date is: > 3 days              Patient currently is not medically stable to d/c.  Objective: Blood pressure 124/80, pulse 96, temperature 98 F (36.7 C), temperature source Oral, resp. rate 16, height 5\' 4"  (1.626 m), weight 98.2 kg, SpO2 95 %.  Examination: General appearance: alert, cooperative, fatigued and no distress Head: Normocephalic, without obvious abnormality, atraumatic Eyes: EOMI Lungs: clear to auscultation bilaterally Heart: regular rate and rhythm and S1, S2 normal Abdomen: firm, remains distended, very hypoactive BS; no TTP Extremities: no UE edema Skin: mobility and turgor normal Neurologic: Grossly normal  Consultants:   Gyn oncology  Medical oncology  Data Reviewed: I have personally reviewed following labs and imaging studies Results for orders placed or performed during the hospital encounter of 04/13/20 (from the past 24 hour(s))  CBC with Differential/Platelet     Status: Abnormal   Collection Time: 04/24/20  6:18 AM  Result Value Ref Range   WBC 5.9 4.0 - 10.5 K/uL   RBC 4.16 3.87 - 5.11 MIL/uL  Hemoglobin 11.6 (L) 12.0 - 15.0 g/dL   HCT 34.9 (L) 36 - 46 %   MCV 83.9 80.0 - 100.0 fL   MCH 27.9 26.0 - 34.0 pg   MCHC 33.2 30.0 - 36.0 g/dL   RDW 14.2 11.5 - 15.5 %   Platelets 154 150 - 400 K/uL   nRBC 0.0 0.0 - 0.2 %   Neutrophils Relative % 89 %   Neutro Abs 5.2 1.7 - 7.7 K/uL   Lymphocytes Relative 7 %   Lymphs Abs 0.4 (L) 0.7 - 4.0 K/uL   Monocytes Relative 2 %   Monocytes Absolute 0.1 0 - 1 K/uL   Eosinophils Relative 2 %   Eosinophils Absolute 0.1 0 - 0 K/uL   Basophils Relative 0 %   Basophils Absolute 0.0 0 - 0 K/uL   Immature Granulocytes 0 %    Abs Immature Granulocytes 0.02 0.00 - 0.07 K/uL  Magnesium     Status: None   Collection Time: 04/24/20  6:18 AM  Result Value Ref Range   Magnesium 2.1 1.7 - 2.4 mg/dL  Comprehensive metabolic panel     Status: Abnormal   Collection Time: 04/24/20  6:18 AM  Result Value Ref Range   Sodium 136 135 - 145 mmol/L   Potassium 4.3 3.5 - 5.1 mmol/L   Chloride 102 98 - 111 mmol/L   CO2 21 (L) 22 - 32 mmol/L   Glucose, Bld 99 70 - 99 mg/dL   BUN 41 (H) 8 - 23 mg/dL   Creatinine, Ser 1.29 (H) 0.44 - 1.00 mg/dL   Calcium 8.4 (L) 8.9 - 10.3 mg/dL   Total Protein 5.4 (L) 6.5 - 8.1 g/dL   Albumin 2.6 (L) 3.5 - 5.0 g/dL   AST 24 15 - 41 U/L   ALT 16 0 - 44 U/L   Alkaline Phosphatase 40 38 - 126 U/L   Total Bilirubin 1.0 0.3 - 1.2 mg/dL   GFR calc non Af Amer 43 (L) >60 mL/min   GFR calc Af Amer 50 (L) >60 mL/min   Anion gap 13 5 - 15  Phosphorus     Status: None   Collection Time: 04/24/20  6:18 AM  Result Value Ref Range   Phosphorus 2.9 2.5 - 4.6 mg/dL    Recent Results (from the past 240 hour(s))  MRSA PCR Screening     Status: None   Collection Time: 04/15/20  3:04 PM   Specimen: Nasal Mucosa; Nasopharyngeal  Result Value Ref Range Status   MRSA by PCR NEGATIVE NEGATIVE Final    Comment:        The GeneXpert MRSA Assay (FDA approved for NASAL specimens only), is one component of a comprehensive MRSA colonization surveillance program. It is not intended to diagnose MRSA infection nor to guide or monitor treatment for MRSA infections. Performed at [facility], [place] [address], [place], [place] [address]      Radiology Studies: No results found. DG Abd Portable 2V  Final Result    DG Abd Portable 2V  Final Result    DG Chest 1 View  Final Result    DG Abd Portable 2V  Final Result    [place] EKG SITE RITE  Final Result    DG C-Arm 1-60 Min-No Report  Final Result    CT CHEST W CONTRAST  Final Result    CT RENAL STONE STUDY  Final Result     DG Chest 2 View  Final Result  Scheduled Meds: . Chlorhexidine Gluconate Cloth  6 each Topical Daily  . feeding supplement  1 Container Oral TID BM  . heparin  5,000 Units Subcutaneous Q8H  . insulin aspart  0-9 Units Subcutaneous Q4H  . metoprolol succinate  25 mg Oral Daily  . pantoprazole  40 mg Oral Daily  . polyethylene glycol  17 g Oral Daily  . senna-docusate  2 tablet Oral QHS  . sodium bicarbonate  650 mg Oral BID  . sodium chloride flush  10-40 mL Intracatheter Q12H   PRN Meds: acetaminophen **OR** acetaminophen, alteplase, alum & mag hydroxide-simeth, heparin lock flush, heparin lock flush, hydrALAZINE, ipratropium-albuterol, nitroGLYCERIN, ondansetron **OR** ondansetron (ZOFRAN) IV, phenol, prochlorperazine, simethicone, sodium chloride flush, sodium chloride flush, sodium chloride flush Continuous Infusions: . sodium chloride    . TPN ADULT (ION)        LOS: 10 days  Time spent: Greater than 50% of the 35 minute visit was spent in counseling/coordination of care for the patient as laid out in the A&P.   Dwyane Dee, MD [facility] 04/24/2020, 4:01 PM   Contact via secure chat.  To contact the attending provider between 7A-7P or the covering provider during after hours 7P-7A, please log into the web site [email or web address] and access using universal [place] password for that web site. If you do not have the password, please call the hospital operator.

## 2020-04-24 NOTE — Progress Notes (Signed)
Nutrition Follow-up  DOCUMENTATION CODES:   Obesity unspecified  INTERVENTION:   Monitor magnesium, potassium, and phosphorus daily for at least 3 days, MD to replete as needed, as pt is at risk for refeeding syndrome.  -TPN management per Pharmacy -Boost Breeze po TID, each supplement provides 250 kcal and 9 grams of protein  NUTRITION DIAGNOSIS:   Increased nutrient needs related to cancer and cancer related treatments as evidenced by estimated needs.  GOAL:   Patient will meet greater than or equal to 90% of their needs  MONITOR:   PO intake, Supplement acceptance, Weight trends, I & O's, Skin, Labs  REASON FOR ASSESSMENT:   Consult Assessment of nutrition requirement/status, New TPN/TNA  ASSESSMENT:   64 y.o. female past medical history of CAD status post angioplasty to the LAD, essential hypertension history of tobacco abuse she is currently non-smoking, GERD that comes into the hospital for 3 weeks of episodic diarrhea that has progressively gotten worse, she has not received any antibiotics over the last year, she is also been nauseated with loss of appetite and decreased oral intake despite this she has continued to take her antihypertensive medication, she does relate that on the day of admission she started having some dysuria  8/12: admitted 8/14: NPO-> Regular 8/15: Regular -> CLD 8/16: NPO -> soft diet 8/17: CLD 8/18: PICC placed 8/19: received first dose of chemo  Patient to begin TPN today at 40 ml/hr (providing 890 kcals and 40g protein).   Pt has had continued poor PO intakes since admission. No BM with abdominal distention. Pt has been on a clear liquid diet since 8/17 (almost a week) and is unable to meet her nutritional needs on a clear liquid diet even with Boost Breeze ordered. Calorie Count would not show much since on a clear liquid diet. Agree with starting TPN.  Admission weight: 216 lbs. No new weights obtained. Weight was ordered when TPN was  ordered.  Labs reviewed. Medications: Miralax, Senokot  Diet Order:   Diet Order            Diet clear liquid Room service appropriate? Yes; Fluid consistency: Thin  Diet effective now                 EDUCATION NEEDS:   Not appropriate for education at this time  Skin:  Skin Assessment: Reviewed RN Assessment  Last BM:  8/14  Height:   Ht Readings from Last 1 Encounters:  04/13/20 5\' 4"  (1.626 m)    Weight:   Wt Readings from Last 1 Encounters:  04/13/20 98 kg   BMI:  Body mass index is 37.09 kg/m.  Estimated Nutritional Needs:   Kcal:  1700-1900  Protein:  80-90g  Fluid:  1.8L/day  Clayton Bibles, MS, RD, LDN Inpatient Clinical Dietitian Contact information available via Amion

## 2020-04-24 NOTE — Progress Notes (Addendum)
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 intake and hydrochlorothiazide use Sodium level has now normalized Monitor closely   AKI due to hydronephrosis, improving Due to dehydration/poor p.o. intake HCTZ and lisinopril placed on hold Status post ureteral stent Creatinine continues to slowly improve   Protein calorie malnutrition Recommend dietitian consult The patient needs calorie count and may need to start her on TPN since nutrition not improving   Abdominal carcinomatosis, tinkling bowel sounds, nausea, imminent bowel obstruction I discussed this with GYN surgeon She is on clear liquids only We will monitor closely Her exam findings are stable compared to yesterday Recommend continuing liquid diet for now Continue MiraLAX and Senokot-S   Hypertension Off lisinopril and hydrochlorothiazide secondary to AKI Hydralazine ordered as needed for systolic blood pressure greater than 160 Further management per hospitalist   CAD status post PTCA On metoprolol; aspirin on hold   Possible UTI versus pneumonia versus compression atelectasis Currently off antibiotics Monitor closely for signs of infection  Shortness of breath/respiratory distress/vascular congestion with pulmonary edema Respiratory status improved but remains on O2 Recommend weaning O2 to keep sats greater than 90% Monitor closely and further management per hospitalist   Goals of care discussion She is  aware she have stage IV disease Treatment goal is palliative The goal for treatment in this hospital stay is to resolve her bowel obstruction prior to discharge   Discharge planning She will likely be here for the next 3 to 5 days Recommend PT/OT consult All questions were answered. The patient knows to call the clinic with any problems, questions or concerns. Will continue to follow   Mikey Bussing, NP 04/24/2020 9:11 AM [name], MD  Subjective:  Bowels still have not moved No flatus Denies nausea or vomiting Still with abdominal fullness Able to take in some clear liquids Reported a small amount of vaginal bleeding overnight Breathing is stable  Objective:  Vitals:   04/23/20 2052 04/24/20 0541  BP: 127/81 122/77  Pulse: 100 99  Resp: 16 16  Temp: 98 F (36.7 C) 97.9 F (36.6 C)  SpO2: 99% 97%     Intake/Output Summary (Last 24 hours) at 04/24/2020 0911 Last data filed at 04/24/2020 0300 Gross per 24 hour  Intake 486.86 ml  Output 500 ml  Net -13.14 ml    GENERAL:alert, no distress and comfortable RESP: Clear to auscultation bilaterally ABDOMEN:abdomen soft, mildly distended with ascites.  Hypoactive bowel sounds noted Musculoskeletal:no cyanosis of digits and no clubbing  NEURO: alert & oriented x 3 with fluent speech, no focal motor/sensory deficits   Labs:  Recent Labs    04/22/20 0615 04/23/20 0522 04/24/20 0618  NA 132* 133* 136  K 3.9 4.2 4.3  CL 101 101 102  CO2 18* 21* 21*  GLUCOSE 107* 99 99  BUN 38* 40* 41*  CREATININE 1.46* 1.40* 1.29*  CALCIUM 8.3* 8.3* 8.4*  GFRNONAA 37* 39* 43*  GFRAA 43* 45* 50*  PROT 5.7* 5.5* 5.4*  ALBUMIN 2.8* 2.5* 2.6*  AST 32  26 24  ALT 15 15 16   ALKPHOS 46 42 40  BILITOT 0.7 0.8 1.0    Studies: I have reviewed x-ray of her abdomen DG Chest 1 View  Result Date: 04/20/2020 CLINICAL DATA:  Increasing short of breath EXAM: CHEST  1 VIEW COMPARISON:  04/13/2020, CT 04/15/2020 FINDINGS: Right upper  extremity central venous catheter tip over the SVC. Cardiomegaly with vascular congestion and diffuse interstitial opacity consistent with edema. Moderate right-sided pleural effusion, likely layering and resulting in hazy asymmetric appearance of the right thorax. At least small left pleural effusion. Bibasilar consolidations. No pneumothorax. IMPRESSION: 1. Cardiomegaly with vascular congestion and diffuse interstitial pulmonary edema. 2. Bilateral pleural effusions, at least moderate on the right and small on the left. 3. Bibasilar consolidations Electronically Signed   By: Donavan Foil M.D.   On: 04/20/2020 22:20   DG Chest 2 View  Result Date: 04/13/2020 CLINICAL DATA:  Weakness diarrhea EXAM: CHEST - 2 VIEW COMPARISON:  07/28/2004 FINDINGS: Heart size upper normal.  Vascularity normal.  Coronary stent noted. Small bilateral pleural effusions. Mild bibasilar atelectasis/infiltrate. IMPRESSION: Interval development of mild bibasilar airspace disease and small pleural effusions. Negative for heart failure. Electronically Signed   By: Franchot Gallo M.D.   On: 04/13/2020 13:57   CT CHEST W CONTRAST  Result Date: 04/15/2020 CLINICAL DATA:  Cancer of unknown primary, staging. EXAM: CT CHEST WITH CONTRAST TECHNIQUE: Multidetector CT imaging of the chest was performed during intravenous contrast administration. CONTRAST:  87mL OMNIPAQUE IOHEXOL 300 MG/ML  SOLN COMPARISON:  None. FINDINGS: Cardiovascular: There is mild calcification of the aortic arch. Normal heart size. No pericardial effusion. A coronary artery stent is seen. Mediastinum/Nodes: No enlarged mediastinal, hilar, or axillary lymph nodes. The thyroid gland and trachea demonstrate no significant findings. There is a small hiatal hernia. Lungs/Pleura: Mild bilateral lower lobe atelectasis is seen, right slightly greater than left. Very mild slightly nodular appearing scarring and/or atelectasis is seen along the posterior aspect of the inferior  left upper lobe. There is a small left pleural effusion. A moderate sized right pleural effusion is also noted. No pneumothorax is identified. Upper Abdomen: A moderate amount of abdominal free fluid is seen. There is moderate to marked severity right-sided hydronephrosis with delayed renal cortical enhancement involving the right kidney. Musculoskeletal: No chest wall abnormality. No acute or significant osseous findings. Degenerative changes seen throughout the thoracic spine. IMPRESSION: 1. Small left pleural effusion with a moderate sized right pleural effusion. 2. Mild bilateral lower lobe atelectasis, right slightly greater than left. 3. Very mild slightly nodular appearing scarring and/or atelectasis along the posterior aspect of the inferior left upper lobe. 4. Moderate amount of abdominal free fluid. 5. Moderate to marked severity right-sided hydronephrosis with delayed renal cortical enhancement involving the right kidney. This is suggestive of partial renal obstruction. 6. Small hiatal hernia. 7. Aortic atherosclerosis. Aortic Atherosclerosis (ICD10-I70.0). Electronically Signed   By: Virgina Norfolk M.D.   On: 04/15/2020 15:38   DG Abd Portable 2V  Result Date: 04/22/2020 CLINICAL DATA:  Decreased bowel sounds EXAM: PORTABLE ABDOMEN - 2 VIEW COMPARISON:  04/21/2020 FINDINGS: Supine frontal views of the abdomen and pelvis are obtained. Right ureteral stent is stable. There is increasing gaseous distention of the colon, most compatible with ileus. No evidence of small-bowel obstruction. No masses or abnormal calcifications. IMPRESSION: 1. Progressive gaseous distention of the colon most consistent with ileus. 2. Stable right ureteral stent. Electronically Signed   By: Randa Ngo M.D.   On:  04/22/2020 15:25   DG Abd Portable 2V  Result Date: 04/21/2020 CLINICAL DATA:  64 year old female with suspected GU cancer, omental caking on recent CT. Hypoactive bowel sounds. EXAM: PORTABLE ABDOMEN - 2  VIEW COMPARISON:  [facility] 04/20/2020.  CT Abdomen and Pelvis 04/14/2020. FINDINGS: Upright and supine views of the abdomen and pelvis. Stable right double-J ureteral stent. Mildly to moderately gas distended large bowel again noted, with gas present to the mid sigmoid. As before, no dilated small bowel loops. But gas is increased since 04/14/2020. No pneumoperitoneum. Stable lung bases, right pleural effusion again evident. Stable visualized osseous structures. IMPRESSION: 1. Continued gas distended colon to the sigmoid, although no dilated small bowel to strongly suggest mechanical obstruction. Continue to favor ileus. 2. No free air. Stable right ureteral stent. Persistent right pleural effusion. Electronically Signed   By: Genevie Ann M.D.   On: 04/21/2020 13:54   DG Abd Portable 2V  Result Date: 04/20/2020 CLINICAL DATA:  Hypoactive bowel sounds. EXAM: PORTABLE ABDOMEN - 2 VIEW COMPARISON:  No prior. FINDINGS: Double-J right ureteral stent in good anatomic position. Colon is slightly distended. Cecum measures 8.9 cm in diameter. Although these findings most likely represent colonic ileus follow-up exam suggested to exclude colonic obstruction. No small bowel distention. No free air. Bibasilar atelectasis/infiltrates. Small right pleural effusion. IMPRESSION: 1.  Double-J right ureteral stent in good anatomic position. 2. Colon is slightly distended. Cecum measures 8.9 cm. Although these findings most likely represent colonic ileus, follow-up exam suggested to exclude colonic obstruction. No small bowel distention. No free air. 3.  Bibasilar atelectasis/infiltrates. Electronically Signed   By: Marcello Moores  Register   On: 04/20/2020 13:00   DG C-Arm 1-60 Min-No Report  Result Date: 04/15/2020 Fluoroscopy was utilized by the requesting physician.  No radiographic interpretation.   CT RENAL STONE STUDY  Result Date: 04/14/2020 CLINICAL DATA:  64 year old female with hematuria. EXAM: CT ABDOMEN AND PELVIS WITHOUT  CONTRAST TECHNIQUE: Multidetector CT imaging of the abdomen and pelvis was performed following the standard protocol without IV contrast. COMPARISON:  None. FINDINGS: Evaluation of this exam is limited in the absence of intravenous contrast. Lower chest: Partially visualized moderate right and small left pleural effusions. There is associated partial compressive atelectasis of the lower lobes. Pneumonia is not excluded. Clinical correlation is recommended. There is coronary vascular calcification. Partially visualized trace pericardial effusion. There is no intra-abdominal free air. Small ascites. Hepatobiliary: No focal liver abnormality is seen. No gallstones, gallbladder wall thickening, or biliary dilatation. Pancreas: Unremarkable. No pancreatic ductal dilatation or surrounding inflammatory changes. Spleen: Normal in size without focal abnormality. Adrenals/Urinary Tract: The adrenal glands unremarkable. The left kidney is unremarkable. There is a moderate right hydronephrosis with mild right hydroureter. No stone identified. There is a transition point in the distal right ureter or right UVJ. The urinary bladder is minimally distended. There is possible mild nodular thickening of the posterior bladder wall adjacent to the right UVJ (77/2). There is loss of fat plane between the anterior lower uterus/cervix and posterior bladder wall. The lower uterus/cervical region is somewhat enlarged. Findings concerning for a neoplastic process either arising from the bladder urothelium or from the cervix and invading into the posterior bladder wall with obstruction of the right UVJ or distal right ureter. Stomach/Bowel: There is a small hiatal hernia. There is no bowel obstruction. There is sigmoid diverticulosis. The appendix is not visualized with certainty. No inflammatory changes identified in the right lower quadrant. Vascular/Lymphatic: Moderate aortoiliac atherosclerotic disease. The IVC is  unremarkable. No portal  venous gas. There is no adenopathy. Reproductive: The uterus is anteverted. Enlargement of the lower uterus/cervical region as described above. Other: There is diffuse omental nodularity and caking consistent with metastatic disease. Musculoskeletal: Osteopenia. No acute osseous pathology. IMPRESSION: 1. Findings concerning for a neoplastic process either arising from the bladder urothelium or from the cervix and invading into the posterior bladder wall with obstruction of the right UVJ or distal right ureter. There is associated moderate right hydronephrosis. Sampling of the ascitic fluid may provide further diagnostic information. 2. Diffuse omental nodularity and caking consistent with metastatic disease. 3. Moderate right and small left pleural effusions with associated partial compressive atelectasis of the lower lobes. Pneumonia is not excluded. Clinical correlation is recommended. 4. Sigmoid diverticulosis. No bowel obstruction. 5. Aortic Atherosclerosis (ICD10-I70.0). Electronically Signed   By: Anner Crete M.D.   On: 04/14/2020 17:44   [place] EKG SITE RITE  Result Date: 04/18/2020 If Site Rite image not attached, placement could not be confirmed due to current cardiac rhythm.

## 2020-04-24 NOTE — Progress Notes (Signed)
Physical Therapy Treatment Patient Details Name: Eileen Burgess MRN: [id number] DOB: 02/22/1956 Today's Date: 04/24/2020    History of Present Illness 64 yo female admitted with UTI. Imaging (+) pelvic mass. S/P R ureteral stent placement, biopsy 8/14. Hx of CAD,MI, obesity    PT Comments    Patient making gradual progress with therapy in acute setting. She is mobilizing at min guard level for transfers and gait and requires moderate encouragement to progress mobility. Pt advanced gait distance today to amb in hallway for first time since hospitalization. No overt LOB noted and VSS with pt saturating at 93-97% on RA. Educated pt on importance of mobilizing with RN/NT staff to progress/maintain activity tolerance. Acute PT will continue to address impairments and progress as able.   Follow Up Recommendations  Supervision for mobility/OOB     Equipment Recommendations  Rolling walker with 5" wheels (TBA)    Recommendations for Other Services       Precautions / Restrictions Precautions Precautions: Fall Restrictions Weight Bearing Restrictions: No    Mobility  Bed Mobility Overal bed mobility: Needs Assistance Bed Mobility: Supine to Sit     Supine to sit: Min guard;HOB elevated     General bed mobility comments: pt requries extra time to initiate raising trunk and bring LE's off EOB, no assist needed but max encouragement.  Transfers Overall transfer level: Needs assistance Equipment used: Rolling walker (2 wheeled) Transfers: Sit to/from Stand Sit to Stand: Min guard         General transfer comment: min guard for safety, pt steady and no sign of LOB. pt performed from EOB and toilet.  Ambulation/Gait Ambulation/Gait assistance: Min guard Gait Distance (Feet): 90 Feet Assistive device: Rolling walker (2 wheeled) Gait Pattern/deviations: Step-through pattern;Decreased stride length     General Gait Details: pt requresting use of RW for extra support today.  She required amb short distance to bathroom and then further gait in hallway. No overt LOB noted during gait, pt on RA throughout and SpO2 remained 93-97% during gait.   Stairs             Wheelchair Mobility    Modified Rankin (Stroke Patients Only)       Balance Overall balance assessment: Needs assistance Sitting-balance support: Feet supported Sitting balance-Leahy Scale: Good     Standing balance support: During functional activity;Bilateral upper extremity supported Standing balance-Leahy Scale: Fair               Cognition Arousal/Alertness: Awake/alert Behavior During Therapy: WFL for tasks assessed/performed Overall Cognitive Status: Within Functional Limits for tasks assessed                   Exercises      General Comments        Pertinent Vitals/Pain Pain Assessment: No/denies pain           PT Goals (current goals can now be found in the care plan section) Acute Rehab PT Goals PT Goal Formulation: With patient Time For Goal Achievement: 04/30/20 Potential to Achieve Goals: Good Progress towards PT goals: Progressing toward goals    Frequency    Min 3X/week      PT Plan Current plan remains appropriate    Co-evaluation              AM-PAC PT "6 Clicks" Mobility   Outcome Measure  Help needed turning from your back to your side while in a flat bed without using bedrails?: A Little Help needed  moving from lying on your back to sitting on the side of a flat bed without using bedrails?: A Little Help needed moving to and from a bed to a chair (including a wheelchair)?: A Little Help needed standing up from a chair using your arms (e.g., wheelchair or bedside chair)?: A Little Help needed to walk in hospital room?: A Little Help needed climbing 3-5 steps with a railing? : A Little 6 Click Score: 18    End of Session Equipment Utilized During Treatment: Gait belt Activity Tolerance: Patient tolerated treatment  well Patient left: in chair;with call bell/phone within reach Nurse Communication: Mobility status PT Visit Diagnosis: Muscle weakness (generalized) (M62.81);Unsteadiness on feet (R26.81)     Time: 5208-0223 PT Time Calculation (min) (ACUTE ONLY): 31 min  Charges:  $Gait Training: 8-22 mins $Therapeutic Activity: 8-22 mins                    Verner Mould, DPT Acute Rehabilitation Services  Office [phone] Pager [phone]  04/24/2020 2:27 PM

## 2020-04-24 NOTE — Progress Notes (Signed)
Gynecologic Oncology Multi-Disciplinary Disposition Conference Note  Date of the Conference: 04/24/2020  Patient Name: Eileen Burgess  Primary GYN Oncologist: Dr. 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 Disposition is to systemic therapy with chemotherapy (carboplatin/Taxol).   This Multidisciplinary conference took place involving physicians from [place], [facility], Radiation Oncology, Pathology, Radiology along with the Gynecologic Oncology Nurse Practitioner and RN.  Comprehensive assessment of the patient's malignancy, staging, need for surgery, chemotherapy, radiation therapy, and need for further testing were reviewed. Supportive measures, both inpatient and following discharge were also discussed. The recommended plan of care is documented. Greater than 35 minutes were spent correlating and coordinating this patient's care.

## 2020-04-24 NOTE — Progress Notes (Signed)
9 Days Post-Op Procedure(s) (LRB): CYSTOSCOPY WITH RETROGRADE PYELOGRAM/URETERAL STENT PLACEMENT (Right) bladder biopsy with fulguration of bleeders (N/A)  64 year old female currently admitted for metastatic cervical cancer s/p stent placement and cycle 1 of carboplatin and taxol on 04/20/2020.   Subjective: Patient reports getting rest over the weekend with sleeping most of the day on Saturday. She has been sitting up in the chair three times a day and ambulating to the bathroom.  Reporting vaginal spotting but denies it as heavy. Abdominal distension and firmness reported as the same.  No nausea or emesis reported.  Tolerating sips of liquids but has no appetite. No flatus or BM reported. She has been using the flutter valve which helps her cough up some phlegm. States her son was recently exposed to a positive COVID case and he is currently trying to find a way to get her some water bottles. No other concerns voiced.  Objective: Vital signs in last 24 hours: Temp:  [97.9 F (36.6 C)-98.1 F (36.7 C)] 97.9 F (36.6 C) (08/23 0541) Pulse Rate:  [98-100] 100 (08/23 1039) Resp:  [16-17] 16 (08/23 0541) BP: (115-127)/(76-86) 115/76 (08/23 1039) SpO2:  [97 %-99 %] 97 % (08/23 0541) Last BM Date: 04/15/20  Intake/Output from previous day: 08/22 0701 - 08/23 0700 In: 486.9 [I.V.:486.9] Out: 750 [Urine:750]  Physical Examination: General: alert, cooperative and no distress Resp: mild expiratory anterior wheezing noted Cardio: regular rate and rhythm, S1, S2 normal, no murmur, click, rub or gallop GI: hyperactive bowel sounds with tinkling, abdomen distended/firm/tympanic Extremities: extremities normal, atraumatic, no cyanosis or edema  Labs: WBC/Hgb/Hct/Plts:  5.9/11.6/34.9/154 (08/23 4235) BUN/Cr/glu/ALT/AST/amyl/lip:  41/1.29/--/16/24/--/-- (08/23 3614)  Assessment: 64 y.o. s/p Procedure(s): CYSTOSCOPY WITH RETROGRADE PYELOGRAM/URETERAL STENT PLACEMENT. S/P cycle 1 of carboplatin  and taxol on 04/20/2020.  Pain:  Pain is well-controlled on PRN medications.  Heme: Hgb 11.6 and Hct 34.9 this am-stable.   ID: WBC count 5.9 this am. Continue to monitor given recent chemotherapy. No evidence of infection at this time.  CV: BP and HR stable. Continue to monitor with ordered vital signs. Metoprolol ordered.  GI:  Tolerating po: sips of clears. Concern for ileus vs partial bowel obstruction. Abd 2 view from 04/22/2020- progressive gaseous distention of the colon most consistent with ileus. Plan for initiation of TPN via PICC.  GU: S/P stent placement. Creatinine 1.29 this am. 750 cc of urine documented for 04/23/2020.  FEN: No critical values noted.  Prophylaxis: SCDs and heparin ordered.  Plan: TPN per pharmacy Encourage ambulation-working with PT now Reportable signs and symptoms reviewed including nausea/emesis, heavy vaginal bleeding Water bottles ordered for patient per her request Continue plan of care per Dr. Alvy Bimler, Hospitalist, Dr. Berline Lopes   LOS: 10 days    Lenna Sciara D Ecko Beasley 04/24/2020, 11:59 AM

## 2020-04-24 NOTE — Care Management Important Message (Signed)
Important Message  Patient Details IM Letter given to the Patient Name: Eileen Burgess MRN: [id number] Date of Birth: 1956-01-31   Medicare Important Message Given:  Yes     [name] 04/24/2020, 9:44 AM

## 2020-04-24 NOTE — Progress Notes (Signed)
PHARMACY - TOTAL PARENTERAL NUTRITION CONSULT NOTE   Indication: Prolonged ileus  Patient Measurements: Height: 5\' 4"  (162.6 cm) Weight: 98 kg (216 lb 0.8 oz) IBW/kg (Calculated) : 54.7 TPN AdjBW (KG): 65.5 Body mass index is 37.09 kg/m. Usual Weight: 98 kg  Assessment: 64 y/o F found to have metastatic gynecologic cancer with carcinomatosis and ureteral obstruction s/p stent placement with minimal oral intake for over a week, possible ileus. Pharmacy consulted to initiate TPN.   Glucose / Insulin: WNL/ to start 8/23 Electrolytes: WNL Renal: SCr stable LFTs / TGs: WNL/ pending Prealbumin / albumin: pending/ 2.6 Intake / Output; MIVF: oral intake poor/ UOP poor; LR @ 50 ml/hr GI Imaging: 8/21 abx XR: ileus vs obstruction Surgeries / Procedures: 8/14 ureteral stent placement   Central access: PICC 8/18 TPN start date: 8/23  Nutritional Goals RD recs pending  Current Nutrition:  CLD  Plan:  Start TPN at 40 mL/hr at 1800 providing 890 kcal and 42 g protein Electrolytes in TPN: 50mEq/L of Na, 22mEq/L of K, 64mEq/L of Ca, 15mEq/L of Mg, and 72mmol/L of Phos. Cl:Ac ratio 1:1 Add standard MVI and trace elements to TPN Initiate Sensitive q4h SSI and adjust as needed  Continue LR @ 50 ml/hr. Will not adjust today but will discuss with MD in AM Monitor TPN labs on Mon/Thurs, baseline labs in AM  [place], Eileen Burgess D 04/24/2020,8:42 AM

## 2020-04-25 LAB — GLUCOSE, CAPILLARY
Glucose-Capillary: 123 mg/dL — ABNORMAL HIGH (ref 70–99)
Glucose-Capillary: 124 mg/dL — ABNORMAL HIGH (ref 70–99)
Glucose-Capillary: 128 mg/dL — ABNORMAL HIGH (ref 70–99)
Glucose-Capillary: 128 mg/dL — ABNORMAL HIGH (ref 70–99)
Glucose-Capillary: 134 mg/dL — ABNORMAL HIGH (ref 70–99)

## 2020-04-25 LAB — COMPREHENSIVE METABOLIC PANEL
ALT: 15 U/L (ref 0–44)
AST: 23 U/L (ref 15–41)
Albumin: 2.6 g/dL — ABNORMAL LOW (ref 3.5–5.0)
Alkaline Phosphatase: 37 U/L — ABNORMAL LOW (ref 38–126)
Anion gap: 11 (ref 5–15)
BUN: 35 mg/dL — ABNORMAL HIGH (ref 8–23)
CO2: 23 mmol/L (ref 22–32)
Calcium: 8.3 mg/dL — ABNORMAL LOW (ref 8.9–10.3)
Chloride: 101 mmol/L (ref 98–111)
Creatinine, Ser: 1.15 mg/dL — ABNORMAL HIGH (ref 0.44–1.00)
GFR calc Af Amer: 57 mL/min — ABNORMAL LOW (ref >60)
GFR calc non Af Amer: 50 mL/min — ABNORMAL LOW (ref >60)
Glucose, Bld: 133 mg/dL — ABNORMAL HIGH (ref 70–99)
Potassium: 4.3 mmol/L (ref 3.5–5.1)
Sodium: 135 mmol/L (ref 135–145)
Total Bilirubin: 0.7 mg/dL (ref 0.3–1.2)
Total Protein: 5.4 g/dL — ABNORMAL LOW (ref 6.5–8.1)

## 2020-04-25 LAB — CBC WITH DIFFERENTIAL/PLATELET
Abs Immature Granulocytes: 0.04 10*3/uL (ref 0.00–0.07)
Basophils Absolute: 0 10*3/uL (ref 0.0–0.1)
Basophils Relative: 0 %
Eosinophils Absolute: 0.1 10*3/uL (ref 0.0–0.5)
Eosinophils Relative: 2 %
HCT: 34.4 % — ABNORMAL LOW (ref 36.0–46.0)
Hemoglobin: 11.4 g/dL — ABNORMAL LOW (ref 12.0–15.0)
Immature Granulocytes: 2 %
Lymphocytes Relative: 12 %
Lymphs Abs: 0.3 10*3/uL — ABNORMAL LOW (ref 0.7–4.0)
MCH: 28.1 pg (ref 26.0–34.0)
MCHC: 33.1 g/dL (ref 30.0–36.0)
MCV: 84.7 fL (ref 80.0–100.0)
Monocytes Absolute: 0.1 10*3/uL (ref 0.1–1.0)
Monocytes Relative: 3 %
Neutro Abs: 2.2 10*3/uL (ref 1.7–7.7)
Neutrophils Relative %: 81 %
Platelets: 130 10*3/uL — ABNORMAL LOW (ref 150–400)
RBC: 4.06 MIL/uL (ref 3.87–5.11)
RDW: 14.2 % (ref 11.5–15.5)
WBC: 2.7 10*3/uL — ABNORMAL LOW (ref 4.0–10.5)
nRBC: 0 % (ref 0.0–0.2)

## 2020-04-25 LAB — MAGNESIUM: Magnesium: 2 mg/dL (ref 1.7–2.4)

## 2020-04-25 LAB — PHOSPHORUS: Phosphorus: 2.8 mg/dL (ref 2.5–4.6)

## 2020-04-25 LAB — PREALBUMIN: Prealbumin: 9.4 mg/dL — ABNORMAL LOW (ref 18–38)

## 2020-04-25 LAB — TRIGLYCERIDES: Triglycerides: 194 mg/dL — ABNORMAL HIGH (ref <150)

## 2020-04-25 MED ORDER — TRAVASOL 10 % IV SOLN
INTRAVENOUS | Status: AC
  Filled 2020-04-25: qty 900

## 2020-04-25 MED ORDER — INSULIN ASPART 100 UNIT/ML ~~LOC~~ SOLN
0.0000 [IU] | Freq: Four times a day (QID) | SUBCUTANEOUS | Status: DC
  Administered 2020-04-25 – 2020-04-26 (×3): 1 [IU] via SUBCUTANEOUS

## 2020-04-25 MED ORDER — TRAVASOL 10 % IV SOLN: INTRAVENOUS | Status: DC

## 2020-04-25 NOTE — Plan of Care (Signed)
Pt VS WNL.  No complaints at this time.   Problem: Education: Goal: Knowledge of General Education information will improve Description: Including pain rating scale, medication(s)/side effects and non-pharmacologic comfort measures Outcome: Progressing   Problem: Health Behavior/Discharge Planning: Goal: Ability to manage health-related needs will improve Outcome: Progressing   Problem: Clinical Measurements: Goal: Ability to maintain clinical measurements within normal limits will improve Outcome: Progressing Goal: Will remain free from infection Outcome: Progressing Goal: Diagnostic test results will improve Outcome: Progressing Goal: Respiratory complications will improve Outcome: Progressing Goal: Cardiovascular complication will be avoided Outcome: Progressing   Problem: Coping: Goal: Level of anxiety will decrease Outcome: Progressing   Problem: Elimination: Goal: Will not experience complications related to bowel motility Outcome: Progressing Goal: Will not experience complications related to urinary retention Outcome: Progressing   Problem: Pain Managment: Goal: General experience of comfort will improve Outcome: Progressing   Problem: Safety: Goal: Ability to remain free from injury will improve Outcome: Progressing   Problem: Skin Integrity: Goal: Risk for impaired skin integrity will decrease Outcome: Progressing   Problem: Activity: Goal: Risk for activity intolerance will decrease Outcome: Not Progressing   Problem: Nutrition: Goal: Adequate nutrition will be maintained Outcome: Not Progressing

## 2020-04-25 NOTE — Progress Notes (Signed)
GYN ONC Follow Up  Patient resting comfortably in bed in no acute distress. States she got a good night's sleep last night.  Ambulated in the halls yesterday and sat in the chair.  No nausea or emesis reported. Sipping on liquids.  No flatus or BM reported. States her abdomen remains tight. She has an intermittent cough and is coughing up phlegm. No dyspnea reported. Lower leg edema reported bilaterally. Asking about her counts dropping after chemo. No other concerns voiced.  Alert, oriented x 3 in no acute distress. Lungs clear. Heart regular rate and rhythm. Abdomen remains firm, distended, tympanic on percussion. Hypoactive bowel sounds. Bilateral lower extremity edema noted, 1+. PICC line in place in the RUE. TPN hanging at rate of 40 cc/hr.  Continue plan per Dr. Alvy Bimler, Hospitalist, and Dr. Berline Lopes. Consideration for NG tube placement if significant emesis occurs. Continue TPN.

## 2020-04-25 NOTE — Progress Notes (Signed)
Pt. has not been wanting to use CPAP this admission, aware to notify if wanting s/u.

## 2020-04-25 NOTE — Progress Notes (Signed)
PHARMACY - TOTAL PARENTERAL NUTRITION CONSULT NOTE   Indication: Prolonged ileus  Patient Measurements: Height: 5\' 4"  (162.6 cm) Weight: 98.2 kg (216 lb 7.9 oz) IBW/kg (Calculated) : 54.7 TPN AdjBW (KG): 65.5 Body mass index is 37.16 kg/m. Usual Weight: 98 kg  Assessment: 64 y/o F found to have metastatic gynecologic cancer with carcinomatosis and ureteral obstruction s/p stent placement with minimal oral intake for over a week, possible ileus. Pharmacy consulted to initiate TPN.   Glucose / Insulin: CBGs 92-128 on sSSI q 4 hours (2 untis/24h) Electrolytes: WNL and stalbe Renal: SCr decreased LFTs / TGs: WNL/ 194 Prealbumin / albumin: 9.4 (8/24)/ 2.6 Intake / Output; MIVF: oral intake poor/ UOP 0.3 ml/hg/hr; no MIVF currently GI Imaging: 8/21 abx XR: ileus vs obstruction Surgeries / Procedures: 8/14 ureteral stent placement   Central access: PICC 8/18 TPN start date: 8/23  Nutritional Goals (per RD recommendation on 8/23): kCal: 1700-1900, Protein: 80-90g, Fluid: 1.8 L/d Goal TPN rate is 75 mL/hr (provides 90 g of protein and 1728 kcals per day)   Current Nutrition:  TPN, CLD  Plan:  Increase TPN to goal rate of 75 mL/hr at 1800 providing 1728 kcal and 90 g protein Electrolytes in TPN: 97mEq/L of Na, 79mEq/L of K, 54mEq/L of Ca, 38mEq/L of Mg, and 6mmol/L of Phos. Cl:Ac ratio 1:1 Add standard MVI and trace elements to TPN Decrease insulin to q 6 h sSSI and adjust as needed  No MIVF currently  Monitor TPN labs on Mon/Thur CMET, Mg, and Phos in AM  Konni Kesinger D 04/25/2020,7:38 AM

## 2020-04-25 NOTE — Progress Notes (Signed)
PROGRESS NOTE    Eileen Burgess  MVE:[id number] DOB: March 15, 1956 DOA: 04/13/2020 PCP: Patient, No Pcp Per   Brief Narrative:  Eileen Burgess is a 64 y.o.female with PMH CAD s/p angioplasty to the LAD, hypertension, history of tobacco abuse she is currently non-smoking, GERD that came to the hospital due to 3 weeks of episodic diarrhea that had progressively gotten worse, she has not received any antibiotics over the last year, she is also been nauseated with loss of appetite and decreased oral intake  She underwent a CT renal stone protocol which revealed an underlying malignancy involving the cervix and invading into the posterior bladder wall with obstruction of the right UVJ or distal right ureter with right hydronephrosis.  Urology was consulted and she has since undergone a ureteral stent on the right side due to ureteral obstruction.   She was also evaluated by oncology and underwent cervical biopsy.  Frozen section and formal pathology are both consistent with adenocarcinoma.  She underwent PICC line placement on 04/19/2020.  On 8/19, she underwent first dose of chemo and tolerated well. Later that night she developed respiratory distress due to volume overload from IVF and a CXR showed vascular congestion. IVF were discontinued and she was started on lasix.  She continued to have abdominal findings concerning for ileus however was not very symptomatic. Serial abdominal xrays showed ongoing colonic gaseous distension.  Due to prolonged ileus and no advancement of her diet, she was started on TPN.  Oncology closely following.    Assessment & Plan:   Principal Problem:   Cervical cancer ([place]) Active Problems:   Essential hypertension   UTI (urinary tract infection)   AKI (acute kidney injury) ([facility])   Hyponatremia   Orthostatic hypotension   Hyponatremia syndrome  Abdominal pain with dysuria, AKI, right hydronephrosis  Metastatic omental carcinomatosis.Cervical cancer-new  diagnosis Bowel obstruction vs ileus -CT abdomen pelvis showed possible neoplastic processes arising from the bladder urothelium or from cervix and invading into the posterior bladder wall with obstruction of right UVJ or distal right ureter, moderate right hydronephrosis, diffuse omental nodularity and caking consistent with metastatic disease. Moderate right and small left pleural effusions, associated partial compressive atelectasis of lower lobes. Discussed with the patient, no prior history of malignancy (just had a history of ovarian cyst at the age of 62 years). -Urology consulted,s/pright ureteral stent placementon 8/14. Biopsy of the posterior bladder with intraoperative findings suggest extrinsic mass invading bladder rather than primary bladder malignancy.  -CT chest with contrast showed small pleural effusion left, moderate sized right pleural effusion, moderate amount of abdominal free fluid, moderate to marked severe right-sided hydronephrosis. No lung lesions. - frozen section consistent with adenocarcinoma; final path also shows adenocarcinoma from cervical biopsy -Received infusion of carboplatin/paclitaxel on 04/20/2020; PICC line placed 04/19/20 -She continued to have abdominal findings concerning for ileus however was not very symptomatic. Serial abdominal xrays showed ongoing colonic gaseous distension.  Due to prolonged ileus and no advancement of her diet, she was started on TPN  Respiratory distress - resolved  - s/p rapid response evening of 04/20/20 - likely volume overload from multiple days of IVF -Diuresed well with Lasix on 04/21/2020.  Renal function mildly worsened, hold off on further diuresis at this time - re-discussed need for IVF for now given such poor oral intake; given IVF on 04/23/20; now with TPN being started, hopefully does not need further IVF -Currently she is on room air.  Hyponatremia, dehydration -resolved  Acute on CKDIII -  baseline creat  appears to be 1.1, but GFR in 2018 mid 50s -In the setting of #1, dehydration, HCTZ use -Continue to hold lisinopril, HCTZ -Status post ureteral stent -Currently kidney function stable  Hypertension -BP stable, continue metoprolol -continue metoprolol, avoid diuretics/ ACE inhibitor  CAD status post PTCA Continue metoprolol, aspirin  GERD Continue PPI  Obesity BMI of 37.  Nutrition Problem: Increased nutrient needs Etiology: cancer and cancer related treatments      DVT prophylaxis: Heparin [place] Code Status: Full code Family Communication: None at bed side Status is: Inpatient  Remains inpatient appropriate because:IV treatments appropriate due to intensity of illness or inability to take PO   Dispo: The patient is from: Home              Anticipated d/c is to: Home              Anticipated d/c date is: > 3 days              Patient currently is not medically stable to d/c.  On TPN  Consultants: Oncology  Procedures: Ureteral stent placement, cervical biopsy  Antimicrobials:  Anti-infectives (From admission, onward)   Start     Dose/Rate Route Frequency Ordered Stop   04/15/20 1547  sodium chloride 0.9 % with cefTRIAXone (ROCEPHIN) ADS Med       Note to Pharmacy: Nunzio Cobbs : cabinet override      04/15/20 1547 04/16/20 0359   04/13/20 1700  cefTRIAXone (ROCEPHIN) 1 g in sodium chloride 0.9 % 100 mL IVPB        1 g 200 mL/hr over 30 Minutes Intravenous Every 24 hours 04/13/20 1635 04/17/20 1659      Subjective:  Patient seen and examined the bedside this afternoon.  Currently she looks comfortable.  Denies any abdominal pain, complains of some abdominal tightness ,no nausea or vomiting.  Objective: Vitals:   04/24/20 1438 04/24/20 2050 04/25/20 0459 04/25/20 0823  BP: 124/80 110/79 118/70 117/77  Pulse: 96 97 (!) 104 98  Resp: 16 20 17    Temp: 98 F (36.7 C) 98.4 F (36.9 C) 98.2 F (36.8 C)   TempSrc: Oral Oral Oral   SpO2: 95% 95%  93%   Weight:      Height:        Intake/Output Summary (Last 24 hours) at 04/25/2020 1426 Last data filed at 04/25/2020 0600 Gross per 24 hour  Intake 1137.29 ml  Output --  Net 1137.29 ml   Filed Weights   04/13/20 1234 04/24/20 0830  Weight: 98 kg 98.2 kg    Examination:  General exam: Appears calm and comfortable ,Not in distress, morbidly obese Respiratory system: Bilateral equal air entry, normal vesicular breath sounds, no wheezes or crackles  Cardiovascular system: S1 & S2 heard, RRR. No JVD, murmurs, rubs, gallops or clicks. No pedal edema. Gastrointestinal system: Abdomen is distended, soft and nontender. Abdominal  masses felt. Normal bowel sounds heard. Central nervous system: Alert and oriented. No focal neurological deficits. Extremities: No edema, no clubbing ,no cyanosis Skin: No rashes, lesions or ulcers,no icterus ,no pallor   Data Reviewed: I have personally reviewed following labs and imaging studies  CBC: Recent Labs  Lab 04/21/20 0514 04/22/20 0615 04/23/20 0522 04/24/20 0618 04/25/20 0500  WBC 9.3 9.0 7.6 5.9 2.7*  NEUTROABS 8.1* 8.1* 7.1 5.2 2.2  HGB 11.6* 12.2 11.6* 11.6* 11.4*  HCT 35.1* 36.3 34.9* 34.9* 34.4*  MCV 83.2 82.3 82.5 83.9 84.7  PLT 304  249 192 154 354*   Basic Metabolic Panel: Recent Labs  Lab 04/21/20 0514 04/22/20 0615 04/23/20 0522 04/24/20 0618 04/25/20 0500  NA 130* 132* 133* 136 135  K 4.4 3.9 4.2 4.3 4.3  CL 100 101 101 102 101  CO2 15* 18* 21* 21* 23  GLUCOSE 133* 107* 99 99 133*  BUN 30* 38* 40* 41* 35*  CREATININE 1.25* 1.46* 1.40* 1.29* 1.15*  CALCIUM 8.5* 8.3* 8.3* 8.4* 8.3*  MG 1.8 2.2 2.2 2.1 2.0  PHOS  --   --   --  2.9 2.8   GFR: Estimated Creatinine Clearance: 54.8 mL/min (A) (by C-G formula based on SCr of 1.15 mg/dL (H)). Liver Function Tests: Recent Labs  Lab 04/21/20 0514 04/22/20 0615 04/23/20 0522 04/24/20 0618 04/25/20 0500  AST 29 32 26 24 23   ALT 12 15 15 16 15   ALKPHOS 47 46 42  40 37*  BILITOT 0.7 0.7 0.8 1.0 0.7  PROT 5.5* 5.7* 5.5* 5.4* 5.4*  ALBUMIN 2.7* 2.8* 2.5* 2.6* 2.6*   No results for input(s): LIPASE, AMYLASE in the last 168 hours. No results for input(s): AMMONIA in the last 168 hours. Coagulation Profile: No results for input(s): INR, PROTIME in the last 168 hours. Cardiac Enzymes: No results for input(s): CKTOTAL, CKMB, CKMBINDEX, TROPONINI in the last 168 hours. BNP (last 3 results) No results for input(s): PROBNP in the last 8760 hours. HbA1C: No results for input(s): HGBA1C in the last 72 hours. CBG: Recent Labs  Lab 04/24/20 2042 04/25/20 0024 04/25/20 0505 04/25/20 0730 04/25/20 1133  GLUCAP 97 128* 128* 123* 124*   Lipid Profile: Recent Labs    04/25/20 0500  TRIG 194*   Thyroid Function Tests: No results for input(s): TSH, T4TOTAL, FREET4, T3FREE, THYROIDAB in the last 72 hours. Anemia Panel: No results for input(s): VITAMINB12, FOLATE, FERRITIN, TIBC, IRON, RETICCTPCT in the last 72 hours. Sepsis Labs: No results for input(s): PROCALCITON, LATICACIDVEN in the last 168 hours.  Recent Results (from the past 240 hour(s))  MRSA PCR Screening     Status: None   Collection Time: 04/15/20  3:04 PM   Specimen: Nasal Mucosa; Nasopharyngeal  Result Value Ref Range Status   MRSA by PCR NEGATIVE NEGATIVE Final    Comment:        The GeneXpert MRSA Assay (FDA approved for NASAL specimens only), is one component of a comprehensive MRSA colonization surveillance program. It is not intended to diagnose MRSA infection nor to guide or monitor treatment for MRSA infections. Performed at [facility], [place] [address] [place], [place] [address]          Radiology Studies: No results found.      Scheduled Meds: . Chlorhexidine Gluconate Cloth  6 each Topical Daily  . feeding supplement  1 Container Oral TID BM  . heparin  5,000 Units Subcutaneous Q8H  . insulin aspart  0-9 Units Subcutaneous Q6H  .  metoprolol succinate  25 mg Oral Daily  . pantoprazole  40 mg Oral Daily  . polyethylene glycol  17 g Oral Daily  . senna-docusate  2 tablet Oral QHS  . sodium bicarbonate  650 mg Oral BID  . sodium chloride flush  10-40 mL Intracatheter Q12H   Continuous Infusions: . sodium chloride    . TPN ADULT (ION) 40 mL/hr at 04/25/20 0221  . TPN ADULT (ION)       LOS: 11 days    Time spent: 35 mins,More than 50% of  that time was spent in counseling and/or coordination of care.      Shelly Coss, MD [facility] P8/24/2021, 2:26 PM

## 2020-04-25 NOTE — Progress Notes (Addendum)
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 intake and hydrochlorothiazide use Sodium level has now normalized Monitor closely   AKI due to hydronephrosis, improving Due to dehydration/poor p.o. intake HCTZ and lisinopril placed on hold Status post ureteral stent Creatinine continues to slowly improve   Protein calorie malnutrition Dietitian is following Been started on TPN    Abdominal carcinomatosis, hypoactive sounds, nausea, imminent bowel obstruction Hopefully we can hold off placement of NG tube She is on clear liquids only We will monitor closely Her exam findings are stable/slightly worse compared to yesterday Recommend continuing liquid diet for now Continue MiraLAX and Senokot-S   Hypertension Off lisinopril and hydrochlorothiazide secondary to AKI Hydralazine ordered as needed for systolic blood pressure greater than 160 Further management per hospitalist   CAD status post PTCA On metoprolol; aspirin on hold   Possible UTI versus pneumonia versus compression atelectasis Currently off antibiotics Monitor closely for signs of infection  Recent shortness of breath/respiratory distress/vascular  congestion with pulmonary edema, resolved Respiratory status improved  Currently off oxygen Monitor closely and further management per hospitalist   Goals of care discussion She is aware she have stage IV disease Treatment goal is palliative The goal for treatment in this hospital stay is to resolve her bowel obstruction prior to discharge   Discharge planning She will likely be here for the next 3 to 5 days Recommend ambulation as tolerated All questions were answered. The patient knows to call the clinic with any problems, questions or concerns. Will continue to follow, I will see her again on Thursday   Kristin Curcio, NP 04/25/2020 8:41 AM Eileen Burgess  Subjective:  Bowels still have not moved No flatus Denies nausea or vomiting Reports that her abdomen feels full/tight Able to take in some clear liquids Vaginal bleeding is less-reports spotting Breathing is stable  Objective:  Vitals:   04/25/20 0459 04/25/20 0823  BP: 118/70 117/77  Pulse: (!) 104 98  Resp: 17   Temp: 98.2 F (36.8 C)   SpO2: 93%      Intake/Output Summary (Last 24 hours) at 04/25/2020 0841 Last data filed at 04/25/2020 0600 Gross per 24 hour  Intake 1617.29 ml  Output --  Net 1617.29 ml    GENERAL:alert, no distress and comfortable RESP: Clear to auscultation bilaterally ABDOMEN:abdomen soft, mildly distended with ascites.  Hypoactive bowel sounds noted Musculoskeletal:no cyanosis of digits and no clubbing  NEURO: alert & oriented x 3 with fluent speech, no focal motor/sensory deficits   Labs:  Recent Labs    04/23/20 0522 04/24/20 0618 04/25/20 0500  NA 133* 136 135  K 4.2 4.3 4.3  CL 101 102 101  CO2 21* 21* 23  GLUCOSE 99 99 133*  BUN 40* 41* 35*  CREATININE 1.40* 1.29* 1.15*  CALCIUM  8.3* 8.4* 8.3*  GFRNONAA 39* 43* 50*  GFRAA 45* 50* 57*  PROT 5.5* 5.4* 5.4*  ALBUMIN 2.5* 2.6* 2.6*  AST 26 24 23   ALT 15 16 15   ALKPHOS 42 40 37*  BILITOT 0.8 1.0 0.7    Studies: I  have reviewed x-ray of her abdomen DG Chest 1 View  Result Date: 04/20/2020 CLINICAL DATA:  Increasing short of breath EXAM: CHEST  1 VIEW COMPARISON:  04/13/2020, CT 04/15/2020 FINDINGS: Right upper extremity central venous catheter tip over the SVC. Cardiomegaly with vascular congestion and diffuse interstitial opacity consistent with edema. Moderate right-sided pleural effusion, likely layering and resulting in hazy asymmetric appearance of the right thorax. At least small left pleural effusion. Bibasilar consolidations. No pneumothorax. IMPRESSION: 1. Cardiomegaly with vascular congestion and diffuse interstitial pulmonary edema. 2. Bilateral pleural effusions, at least moderate on the right and small on the left. 3. Bibasilar consolidations Electronically Signed   By: Donavan Foil M.D.   On: 04/20/2020 22:20   DG Chest 2 View  Result Date: 04/13/2020 CLINICAL DATA:  Weakness diarrhea EXAM: CHEST - 2 VIEW COMPARISON:  07/28/2004 FINDINGS: Heart size upper normal.  Vascularity normal.  Coronary stent noted. Small bilateral pleural effusions. Mild bibasilar atelectasis/infiltrate. IMPRESSION: Interval development of mild bibasilar airspace disease and small pleural effusions. Negative for heart failure. Electronically Signed   By: Franchot Gallo M.D.   On: 04/13/2020 13:57   CT CHEST W CONTRAST  Result Date: 04/15/2020 CLINICAL DATA:  Cancer of unknown primary, staging. EXAM: CT CHEST WITH CONTRAST TECHNIQUE: Multidetector CT imaging of the chest was performed during intravenous contrast administration. CONTRAST:  4mL OMNIPAQUE IOHEXOL 300 MG/ML  SOLN COMPARISON:  None. FINDINGS: Cardiovascular: There is mild calcification of the aortic arch. Normal heart size. No pericardial effusion. A coronary artery stent is seen. Mediastinum/Nodes: No enlarged mediastinal, hilar, or axillary lymph nodes. The thyroid gland and trachea demonstrate no significant findings. There is a small hiatal hernia.  Lungs/Pleura: Mild bilateral lower lobe atelectasis is seen, right slightly greater than left. Very mild slightly nodular appearing scarring and/or atelectasis is seen along the posterior aspect of the inferior left upper lobe. There is a small left pleural effusion. A moderate sized right pleural effusion is also noted. No pneumothorax is identified. Upper Abdomen: A moderate amount of abdominal free fluid is seen. There is moderate to marked severity right-sided hydronephrosis with delayed renal cortical enhancement involving the right kidney. Musculoskeletal: No chest wall abnormality. No acute or significant osseous findings. Degenerative changes seen throughout the thoracic spine. IMPRESSION: 1. Small left pleural effusion with a moderate sized right pleural effusion. 2. Mild bilateral lower lobe atelectasis, right slightly greater than left. 3. Very mild slightly nodular appearing scarring and/or atelectasis along the posterior aspect of the inferior left upper lobe. 4. Moderate amount of abdominal free fluid. 5. Moderate to marked severity right-sided hydronephrosis with delayed renal cortical enhancement involving the right kidney. This is suggestive of partial renal obstruction. 6. Small hiatal hernia. 7. Aortic atherosclerosis. Aortic Atherosclerosis (ICD10-I70.0). Electronically Signed   By: Virgina Norfolk M.D.   On: 04/15/2020 15:38   DG Abd Portable 2V  Result Date: 04/22/2020 CLINICAL DATA:  Decreased bowel sounds EXAM: PORTABLE ABDOMEN - 2 VIEW COMPARISON:  04/21/2020 FINDINGS: Supine frontal views of the abdomen and pelvis are obtained. Right ureteral stent is stable. There is increasing gaseous distention of the colon, most compatible with ileus. No evidence of small-bowel obstruction. No masses or abnormal calcifications. IMPRESSION: 1. Progressive  gaseous distention of the colon most consistent with ileus. 2. Stable right ureteral stent. Electronically Signed   By: Randa Ngo M.D.   On:  04/22/2020 15:25   DG Abd Portable 2V  Result Date: 04/21/2020 CLINICAL DATA:  64 year old female with suspected GU cancer, omental caking on recent CT. Hypoactive bowel sounds. EXAM: PORTABLE ABDOMEN - 2 VIEW COMPARISON:  KUB 04/20/2020.  CT Abdomen and Pelvis 04/14/2020. FINDINGS: Upright and supine views of the abdomen and pelvis. Stable right double-J ureteral stent. Mildly to moderately gas distended large bowel again noted, with gas present to the mid sigmoid. As before, no dilated small bowel loops. But gas is increased since 04/14/2020. No pneumoperitoneum. Stable lung bases, right pleural effusion again evident. Stable visualized osseous structures. IMPRESSION: 1. Continued gas distended colon to the sigmoid, although no dilated small bowel to strongly suggest mechanical obstruction. Continue to favor ileus. 2. No free air. Stable right ureteral stent. Persistent right pleural effusion. Electronically Signed   By: Genevie Ann M.D.   On: 04/21/2020 13:54   DG Abd Portable 2V  Result Date: 04/20/2020 CLINICAL DATA:  Hypoactive bowel sounds. EXAM: PORTABLE ABDOMEN - 2 VIEW COMPARISON:  No prior. FINDINGS: Double-J right ureteral stent in good anatomic position. Colon is slightly distended. Cecum measures 8.9 cm in diameter. Although these findings most likely represent colonic ileus follow-up exam suggested to exclude colonic obstruction. No small bowel distention. No free air. Bibasilar atelectasis/infiltrates. Small right pleural effusion. IMPRESSION: 1.  Double-J right ureteral stent in good anatomic position. 2. Colon is slightly distended. Cecum measures 8.9 cm. Although these findings most likely represent colonic ileus, follow-up exam suggested to exclude colonic obstruction. No small bowel distention. No free air. 3.  Bibasilar atelectasis/infiltrates. Electronically Signed   By: Marcello Moores  Register   On: 04/20/2020 13:00   DG C-Arm 1-60 Min-No Report  Result Date: 04/15/2020 Fluoroscopy was  utilized by the requesting physician.  No radiographic interpretation.   CT RENAL STONE STUDY  Result Date: 04/14/2020 CLINICAL DATA:  64 year old female with hematuria. EXAM: CT ABDOMEN AND PELVIS WITHOUT CONTRAST TECHNIQUE: Multidetector CT imaging of the abdomen and pelvis was performed following the standard protocol without IV contrast. COMPARISON:  None. FINDINGS: Evaluation of this exam is limited in the absence of intravenous contrast. Lower chest: Partially visualized moderate right and small left pleural effusions. There is associated partial compressive atelectasis of the lower lobes. Pneumonia is not excluded. Clinical correlation is recommended. There is coronary vascular calcification. Partially visualized trace pericardial effusion. There is no intra-abdominal free air. Small ascites. Hepatobiliary: No focal liver abnormality is seen. No gallstones, gallbladder wall thickening, or biliary dilatation. Pancreas: Unremarkable. No pancreatic ductal dilatation or surrounding inflammatory changes. Spleen: Normal in size without focal abnormality. Adrenals/Urinary Tract: The adrenal glands unremarkable. The left kidney is unremarkable. There is a moderate right hydronephrosis with mild right hydroureter. No stone identified. There is a transition point in the distal right ureter or right UVJ. The urinary bladder is minimally distended. There is possible mild nodular thickening of the posterior bladder wall adjacent to the right UVJ (77/2). There is loss of fat plane between the anterior lower uterus/cervix and posterior bladder wall. The lower uterus/cervical region is somewhat enlarged. Findings concerning for a neoplastic process either arising from the bladder urothelium or from the cervix and invading into the posterior bladder wall with obstruction of the right UVJ or distal right ureter. Stomach/Bowel: There is a small hiatal hernia. There is no bowel obstruction. There is  sigmoid diverticulosis.  The appendix is not visualized with certainty. No inflammatory changes identified in the right lower quadrant. Vascular/Lymphatic: Moderate aortoiliac atherosclerotic disease. The IVC is unremarkable. No portal venous gas. There is no adenopathy. Reproductive: The uterus is anteverted. Enlargement of the lower uterus/cervical region as described above. Other: There is diffuse omental nodularity and caking consistent with metastatic disease. Musculoskeletal: Osteopenia. No acute osseous pathology. IMPRESSION: 1. Findings concerning for a neoplastic process either arising from the bladder urothelium or from the cervix and invading into the posterior bladder wall with obstruction of the right UVJ or distal right ureter. There is associated moderate right hydronephrosis. Sampling of the ascitic fluid may provide further diagnostic information. 2. Diffuse omental nodularity and caking consistent with metastatic disease. 3. Moderate right and small left pleural effusions with associated partial compressive atelectasis of the lower lobes. Pneumonia is not excluded. Clinical correlation is recommended. 4. Sigmoid diverticulosis. No bowel obstruction. 5. Aortic Atherosclerosis (ICD10-I70.0). Electronically Signed   By: Anner Crete M.D.   On: 04/14/2020 17:44   [place] EKG SITE RITE  Result Date: 04/18/2020 If Site Rite image not attached, placement could not be confirmed due to current cardiac rhythm.

## 2020-04-26 LAB — COMPREHENSIVE METABOLIC PANEL
ALT: 13 U/L (ref 0–44)
AST: 21 U/L (ref 15–41)
Albumin: 2.4 g/dL — ABNORMAL LOW (ref 3.5–5.0)
Alkaline Phosphatase: 35 U/L — ABNORMAL LOW (ref 38–126)
Anion gap: 11 (ref 5–15)
BUN: 33 mg/dL — ABNORMAL HIGH (ref 8–23)
CO2: 24 mmol/L (ref 22–32)
Calcium: 8.5 mg/dL — ABNORMAL LOW (ref 8.9–10.3)
Chloride: 102 mmol/L (ref 98–111)
Creatinine, Ser: 1.06 mg/dL — ABNORMAL HIGH (ref 0.44–1.00)
GFR calc Af Amer: >60 mL/min (ref >60)
GFR calc non Af Amer: 55 mL/min — ABNORMAL LOW (ref >60)
Glucose, Bld: 120 mg/dL — ABNORMAL HIGH (ref 70–99)
Potassium: 4.6 mmol/L (ref 3.5–5.1)
Sodium: 137 mmol/L (ref 135–145)
Total Bilirubin: 0.4 mg/dL (ref 0.3–1.2)
Total Protein: 5.3 g/dL — ABNORMAL LOW (ref 6.5–8.1)

## 2020-04-26 LAB — GLUCOSE, CAPILLARY
Glucose-Capillary: 124 mg/dL — ABNORMAL HIGH (ref 70–99)
Glucose-Capillary: 129 mg/dL — ABNORMAL HIGH (ref 70–99)
Glucose-Capillary: 131 mg/dL — ABNORMAL HIGH (ref 70–99)
Glucose-Capillary: 136 mg/dL — ABNORMAL HIGH (ref 70–99)

## 2020-04-26 LAB — CBC WITH DIFFERENTIAL/PLATELET
Abs Immature Granulocytes: 0.01 10*3/uL (ref 0.00–0.07)
Basophils Absolute: 0 10*3/uL (ref 0.0–0.1)
Basophils Relative: 1 %
Eosinophils Absolute: 0.1 10*3/uL (ref 0.0–0.5)
Eosinophils Relative: 4 %
HCT: 33.2 % — ABNORMAL LOW (ref 36.0–46.0)
Hemoglobin: 10.9 g/dL — ABNORMAL LOW (ref 12.0–15.0)
Immature Granulocytes: 1 %
Lymphocytes Relative: 25 %
Lymphs Abs: 0.3 10*3/uL — ABNORMAL LOW (ref 0.7–4.0)
MCH: 28.2 pg (ref 26.0–34.0)
MCHC: 32.8 g/dL (ref 30.0–36.0)
MCV: 85.8 fL (ref 80.0–100.0)
Monocytes Absolute: 0 10*3/uL — ABNORMAL LOW (ref 0.1–1.0)
Monocytes Relative: 3 %
Neutro Abs: 0.9 10*3/uL — ABNORMAL LOW (ref 1.7–7.7)
Neutrophils Relative %: 66 %
Platelets: 98 10*3/uL — ABNORMAL LOW (ref 150–400)
RBC: 3.87 MIL/uL (ref 3.87–5.11)
RDW: 14.3 % (ref 11.5–15.5)
WBC: 1.4 10*3/uL — CL (ref 4.0–10.5)
nRBC: 0 % (ref 0.0–0.2)

## 2020-04-26 LAB — PHOSPHORUS: Phosphorus: 3 mg/dL (ref 2.5–4.6)

## 2020-04-26 LAB — MAGNESIUM: Magnesium: 2.1 mg/dL (ref 1.7–2.4)

## 2020-04-26 MED ORDER — TRAVASOL 10 % IV SOLN
INTRAVENOUS | Status: AC
  Filled 2020-04-26: qty 900

## 2020-04-26 MED ORDER — INSULIN ASPART 100 UNIT/ML ~~LOC~~ SOLN
0.0000 [IU] | Freq: Three times a day (TID) | SUBCUTANEOUS | Status: DC
  Administered 2020-04-26: 1 [IU] via SUBCUTANEOUS

## 2020-04-26 NOTE — Progress Notes (Signed)
PHARMACY - TOTAL PARENTERAL NUTRITION CONSULT NOTE   Indication: Prolonged ileus  Patient Measurements: Height: 5\' 4"  (162.6 cm) Weight: 98.2 kg (216 lb 7.9 oz) IBW/kg (Calculated) : 54.7 TPN AdjBW (KG): 65.5 Body mass index is 37.16 kg/m. Usual Weight: 98 kg  Assessment: 65 y/o F found to have metastatic gynecologic cancer with carcinomatosis and ureteral obstruction s/p stent placement with minimal oral intake for over a week, possible ileus. Pharmacy consulted to initiate TPN.   Glucose / Insulin: CBGs 120-134 on sSSI q 4 hours (4 untis/24h) Electrolytes: WNL and stalbe Renal: SCr continues to improve LFTs / TGs: WNL/ 194 (8/24) Prealbumin / albumin: 9.4 (8/24)/ 2.4 Intake / Output; MIVF: oral intake poor/ UOP not charted but several urine occurrenceds; no MIVF currently GI Imaging: 8/21 abx XR: ileus vs obstruction Surgeries / Procedures: 8/14 ureteral stent placement   Central access: PICC 8/18 TPN start date: 8/23  Nutritional Goals (per RD recommendation on 8/23): kCal: 1700-1900, Protein: 80-90g, Fluid: 1.8 L/d Goal TPN rate is 75 mL/hr (provides 90 g of protein and 1728 kcals per day)   Current Nutrition:  TPN, CLD  Plan:  Continue TPN at goal rate of 75 mL/hr at 1800 providing 1728 kcal and 90 g protein Electrolytes in TPN: 52mEq/L of Na, 35mEq/L of K, 71mEq/L of Ca, 27mEq/L of Mg, and 66mmol/L of Phos. Cl:Ac ratio 1:1 Add standard MVI and trace elements to TPN Decrease insulin to q 8 h sSSI and adjust as needed  No MIVF currently  Monitor TPN labs on Mon/Thur CMET, Mg, and Phos in AM  Eileen Burgess D 04/26/2020,7:18 AM

## 2020-04-26 NOTE — Progress Notes (Signed)
Physical Therapy Treatment Patient Details Name: Eileen Burgess MRN: [id number] DOB: 21-Apr-1956 Today's Date: 04/26/2020    History of Present Illness 64 yo female admitted with UTI. Imaging (+) pelvic mass. S/P R ureteral stent placement, biopsy 8/14. Hx of CAD,MI, obesity    PT Comments    Patient making steady progress with therapy and was agreeable to mobilize for gait training and exercise this session. Patient remains limited by fatigue and required standing rest break halfway through gait. She required seated rest break at EOB prior to LE exercises. Pt educated on functional LE strengthening with repeated sit<>stands, pt only able to complete 1 set of 5 due to fatigue. Acute PT will continue to progress as able.   Follow Up Recommendations  Supervision for mobility/OOB     Equipment Recommendations  Rolling walker with 5" wheels (TBA)    Recommendations for Other Services       Precautions / Restrictions Precautions Precautions: Fall    Mobility  Bed Mobility Overal bed mobility: Needs Assistance Bed Mobility: Supine to Sit     Supine to sit: HOB elevated;Supervision Sit to supine: Supervision   General bed mobility comments: pt requires extra time and encouragement to initiate mobility, pt able to bring LE's off/on bed without assist. pt using bed rail to raise/lower trunk in bed.  Transfers Overall transfer level: Needs assistance Equipment used: Rolling walker (2 wheeled) Transfers: Sit to/from Stand Sit to Stand: Min guard         General transfer comment: min guard for safety, no assist required for power up. sit<>stand performed for strengthening.  Ambulation/Gait Ambulation/Gait assistance: Min guard Gait Distance (Feet): 90 Feet Assistive device: Rolling walker (2 wheeled) Gait Pattern/deviations: Step-through pattern;Decreased stride length Gait velocity: decr   General Gait Details: continued with RW today for gait, pt feels more secure with  walker and has support to take standing rest breaks as needed. no overt LOB noted during gait, min guard for safety. HR between 108-117 and SpO2 at 96%.   Stairs             Wheelchair Mobility    Modified Rankin (Stroke Patients Only)       Balance Overall balance assessment: Needs assistance Sitting-balance support: Feet supported Sitting balance-Leahy Scale: Good     Standing balance support: During functional activity;Bilateral upper extremity supported Standing balance-Leahy Scale: Fair               Cognition Arousal/Alertness: Awake/alert Behavior During Therapy: WFL for tasks assessed/performed Overall Cognitive Status: Within Functional Limits for tasks assessed                 Exercises Other Exercises Other Exercises: Repeated Sit<>Stands: 1x 5 reps, no UE use for power up, bed slightly elevated.    General Comments        Pertinent Vitals/Pain Pain Assessment: No/denies pain       Prior Function            PT Goals (current goals can now be found in the care plan section) Acute Rehab PT Goals PT Goal Formulation: With patient Time For Goal Achievement: 04/30/20 Potential to Achieve Goals: Good Progress towards PT goals: Progressing toward goals    Frequency    Min 3X/week      PT Plan Current plan remains appropriate       AM-PAC PT "6 Clicks" Mobility   Outcome Measure  Help needed turning from your back to your side while in a flat  bed without using bedrails?: A Little Help needed moving from lying on your back to sitting on the side of a flat bed without using bedrails?: A Little Help needed moving to and from a bed to a chair (including a wheelchair)?: A Little Help needed standing up from a chair using your arms (e.g., wheelchair or bedside chair)?: A Little Help needed to walk in hospital room?: A Little Help needed climbing 3-5 steps with a railing? : A Little 6 Click Score: 18    End of Session Equipment  Utilized During Treatment: Gait belt Activity Tolerance: Patient tolerated treatment well Patient left: in chair;with call bell/phone within reach Nurse Communication: Mobility status PT Visit Diagnosis: Muscle weakness (generalized) (M62.81);Unsteadiness on feet (R26.81)     Time: 7654-6503 PT Time Calculation (min) (ACUTE ONLY): 28 min  Charges:  $Gait Training: 8-22 mins $Therapeutic Exercise: 8-22 mins                     Verner Mould, DPT Acute Rehabilitation Services  Office [phone] Pager [phone]  04/26/2020 3:37 PM

## 2020-04-26 NOTE — Progress Notes (Signed)
PROGRESS NOTE    VERSA CRATON  YKD:[id number] DOB: 12-26-1955 DOA: 04/13/2020 PCP: Patient, No Pcp Per   Brief Narrative:  Eileen Burgess is a 64 y.o.female with PMH CAD s/p angioplasty to the LAD, hypertension, history of tobacco abuse she is currently non-smoking, GERD that came to the hospital due to 3 weeks of episodic diarrhea that had progressively gotten worse, she has not received any antibiotics over the last year, she is also been nauseated with loss of appetite and decreased oral intake  She underwent a CT renal stone protocol which revealed an underlying malignancy involving the cervix and invading into the posterior bladder wall with obstruction of the right UVJ or distal right ureter with right hydronephrosis.  Urology was consulted and she has since undergone a ureteral stent on the right side due to ureteral obstruction.   She was also evaluated by oncology and underwent cervical biopsy.  Frozen section and formal pathology are both consistent with adenocarcinoma.  She underwent PICC line placement on 04/19/2020.  On 8/19, she underwent first dose of chemo and tolerated well. Later that night she developed respiratory distress due to volume overload from IVF and a CXR showed vascular congestion. IVF were discontinued and she was started on lasix.  She continued to have abdominal findings concerning for ileus however was not very symptomatic. Serial abdominal xrays showed ongoing colonic gaseous distension.  Due to prolonged ileus and no advancement of her diet, she was started on TPN.  Oncology closely following.    Assessment & Plan:   Principal Problem:   Cervical cancer ([place]) Active Problems:   Essential hypertension   UTI (urinary tract infection)   AKI (acute kidney injury) (HCC)   Hyponatremia   Orthostatic hypotension   Hyponatremia syndrome  Abdominal pain with dysuria, AKI, right hydronephrosis  Metastatic omental carcinomatosis.Cervical cancer-new  diagnosis Bowel obstruction vs ileus -CT abdomen pelvis showed possible neoplastic processes arising from the bladder urothelium or from cervix and invading into the posterior bladder wall with obstruction of right UVJ or distal right ureter, moderate right hydronephrosis, diffuse omental nodularity and caking consistent with metastatic disease. Moderate right and small left pleural effusions, associated partial compressive atelectasis of lower lobes. Discussed with the patient, no prior history of malignancy (just had a history of ovarian cyst at the age of 29 years). -Urology consulted,s/pright ureteral stent placementon 8/14. Biopsy of the posterior bladder with intraoperative findings suggest extrinsic mass invading bladder rather than primary bladder malignancy.  -CT chest with contrast showed small pleural effusion left, moderate sized right pleural effusion, moderate amount of abdominal free fluid, moderate to marked severe right-sided hydronephrosis. No lung lesions. - frozen section consistent with adenocarcinoma; final path also shows adenocarcinoma from cervical biopsy -Received infusion of carboplatin/paclitaxel on 04/20/2020; PICC line placed 04/19/20 -She continued to have abdominal findings concerning for ileus however was not very symptomatic. Serial abdominal xrays showed ongoing colonic gaseous distension.  Due to prolonged ileus and no advancement of her diet, she was started on TPN  Leukopenia: Most likely associated  with malignancy, chemotherapy.  I will leave the decision for Granix up to oncology.  Continue to monitor CBC.  Respiratory distress - resolved  - s/p rapid response evening of 04/20/20 - likely volume overload from multiple days of IVF -Diuresed well with Lasix on 04/21/2020.  Renal function mildly worsened, hold off on further diuresis at this time - re-discussed need for IVF for now given such poor oral intake; given IVF on 04/23/20; now  with TPN being started,  hopefully does not need further IVF -Currently she is on room air.  Hyponatremia, dehydration -resolved  Acute on CKDIII - baseline creat appears to be 1.1, but GFR in 2018 mid 50s -In the setting of #1, dehydration, HCTZ use -Continue to hold lisinopril, HCTZ -Status post ureteral stent -Currently kidney function stable  Hypertension -BP stable, continue metoprolol -continue metoprolol, avoid diuretics/ ACE inhibitor  CAD status post PTCA Continue metoprolol, aspirin  GERD Continue PPI  Obesity BMI of 37.  Constipation Continue MiraLAX, Senokot.  Patient denies taking any enema or suppository.  Nutrition Problem: Increased nutrient needs Etiology: cancer and cancer related treatments      DVT prophylaxis: Heparin [place] Code Status: Full code Family Communication: None at bed side Status is: Inpatient  Remains inpatient appropriate because:IV treatments appropriate due to intensity of illness or inability to take PO   Dispo: The patient is from: Home              Anticipated d/c is to: Home              Anticipated d/c date is: > 3 days              Patient currently is not medically stable to d/c.  On TPN.  Needs oncology clearance before discharge.  Consultants: Oncology  Procedures: Ureteral stent placement, cervical biopsy  Antimicrobials:  Anti-infectives (From admission, onward)   Start     Dose/Rate Route Frequency Ordered Stop   04/15/20 1547  sodium chloride 0.9 % with cefTRIAXone (ROCEPHIN) ADS Med       Note to Pharmacy: Nunzio Cobbs : cabinet override      04/15/20 1547 04/16/20 0359   04/13/20 1700  cefTRIAXone (ROCEPHIN) 1 g in sodium chloride 0.9 % 100 mL IVPB        1 g 200 mL/hr over 30 Minutes Intravenous Every 24 hours 04/13/20 1635 04/17/20 1659      Subjective:  Patient seen and examined the bedside this morning.  Hemodynamically stable.  Comfortable.  Denies any abdominal pain, nausea or vomiting.  Has not passed  bowel for last Sunday but does not want to take any enema or suppository.  Objective: Vitals:   04/25/20 0459 04/25/20 0823 04/25/20 1540 04/25/20 2049  BP: 118/70 117/77 117/79 121/70  Pulse: (!) 104 98 (!) 105 97  Resp: 17  16 19   Temp: 98.2 F (36.8 C)  98.1 F (36.7 C) 98 F (36.7 C)  TempSrc: Oral  Oral Oral  SpO2: 93%  96% 94%  Weight:      Height:       No intake or output data in the 24 hours ending 04/26/20 0808 Filed Weights   04/13/20 1234 04/24/20 0830  Weight: 98 kg 98.2 kg    Examination:  General exam: Pleasant, not in apparent distress Respiratory system: Bilateral equal air entry, normal vesicular breath sounds, no wheezes or crackles  Cardiovascular system: S1 & S2 heard, RRR. No JVD, murmurs, rubs, gallops or clicks. Gastrointestinal system: Abdomen is distended, nontender,mass felt  Central nervous system: Alert and oriented. No focal neurological deficits. Extremities: No edema, no clubbing ,no cyanosis, distal peripheral pulses palpable. Skin: No rashes, lesions or ulcers,no icterus ,no pallor   Data Reviewed: I have personally reviewed following labs and imaging studies  CBC: Recent Labs  Lab 04/22/20 0615 04/23/20 0522 04/24/20 0618 04/25/20 0500 04/26/20 0545  WBC 9.0 7.6 5.9 2.7* 1.4*  NEUTROABS 8.1* 7.1 5.2  2.2 0.9*  HGB 12.2 11.6* 11.6* 11.4* 10.9*  HCT 36.3 34.9* 34.9* 34.4* 33.2*  MCV 82.3 82.5 83.9 84.7 85.8  PLT 249 192 154 130* 98*   Basic Metabolic Panel: Recent Labs  Lab 04/22/20 0615 04/23/20 0522 04/24/20 0618 04/25/20 0500 04/26/20 0545  NA 132* 133* 136 135 137  K 3.9 4.2 4.3 4.3 4.6  CL 101 101 102 101 102  CO2 18* 21* 21* 23 24  GLUCOSE 107* 99 99 133* 120*  BUN 38* 40* 41* 35* 33*  CREATININE 1.46* 1.40* 1.29* 1.15* 1.06*  CALCIUM 8.3* 8.3* 8.4* 8.3* 8.5*  MG 2.2 2.2 2.1 2.0 2.1  PHOS  --   --  2.9 2.8 3.0   GFR: Estimated Creatinine Clearance: 59.4 mL/min (A) (by C-G formula based on SCr of 1.06 mg/dL  (H)). Liver Function Tests: Recent Labs  Lab 04/22/20 0615 04/23/20 0522 04/24/20 0618 04/25/20 0500 04/26/20 0545  AST 32 26 24 23 21   ALT 15 15 16 15 13   ALKPHOS 46 42 40 37* 35*  BILITOT 0.7 0.8 1.0 0.7 0.4  PROT 5.7* 5.5* 5.4* 5.4* 5.3*  ALBUMIN 2.8* 2.5* 2.6* 2.6* 2.4*   No results for input(s): LIPASE, AMYLASE in the last 168 hours. No results for input(s): AMMONIA in the last 168 hours. Coagulation Profile: No results for input(s): INR, PROTIME in the last 168 hours. Cardiac Enzymes: No results for input(s): CKTOTAL, CKMB, CKMBINDEX, TROPONINI in the last 168 hours. BNP (last 3 results) No results for input(s): PROBNP in the last 8760 hours. HbA1C: No results for input(s): HGBA1C in the last 72 hours. CBG: Recent Labs  Lab 04/25/20 0730 04/25/20 1133 04/25/20 1735 04/26/20 0021 04/26/20 0551  GLUCAP 123* 124* 134* 131* 129*   Lipid Profile: Recent Labs    04/25/20 0500  TRIG 194*   Thyroid Function Tests: No results for input(s): TSH, T4TOTAL, FREET4, T3FREE, THYROIDAB in the last 72 hours. Anemia Panel: No results for input(s): VITAMINB12, FOLATE, FERRITIN, TIBC, IRON, RETICCTPCT in the last 72 hours. Sepsis Labs: No results for input(s): PROCALCITON, LATICACIDVEN in the last 168 hours.  No results found for this or any previous visit (from the past 240 hour(s)).       Radiology Studies: No results found.      Scheduled Meds: . Chlorhexidine Gluconate Cloth  6 each Topical Daily  . feeding supplement  1 Container Oral TID BM  . heparin  5,000 Units Subcutaneous Q8H  . insulin aspart  0-9 Units Subcutaneous Q8H  . metoprolol succinate  25 mg Oral Daily  . pantoprazole  40 mg Oral Daily  . polyethylene glycol  17 g Oral Daily  . senna-docusate  2 tablet Oral QHS  . sodium bicarbonate  650 mg Oral BID  . sodium chloride flush  10-40 mL Intracatheter Q12H   Continuous Infusions: . sodium chloride    . TPN ADULT (ION) 75 mL/hr at 04/25/20  1716  . TPN ADULT (ION)       LOS: 12 days    Time spent: 35 mins,More than 50% of that time was spent in counseling and/or coordination of care.      Shelly Coss, MD [facility] P8/25/2021, 8:08 AM

## 2020-04-27 ENCOUNTER — Inpatient Hospital Stay (HOSPITAL_COMMUNITY): Payer: Medicare Other

## 2020-04-27 LAB — GLUCOSE, CAPILLARY
Glucose-Capillary: 137 mg/dL — ABNORMAL HIGH (ref 70–99)
Glucose-Capillary: 128 mg/dL — ABNORMAL HIGH (ref 70–99)

## 2020-04-27 LAB — CBC WITH DIFFERENTIAL/PLATELET
Abs Immature Granulocytes: 0.01 10*3/uL (ref 0.00–0.07)
Basophils Absolute: 0 10*3/uL (ref 0.0–0.1)
Basophils Relative: 1 %
Eosinophils Absolute: 0.1 10*3/uL (ref 0.0–0.5)
Eosinophils Relative: 5 %
HCT: 37.3 % (ref 36.0–46.0)
Hemoglobin: 11.4 g/dL — ABNORMAL LOW (ref 12.0–15.0)
Immature Granulocytes: 1 %
Lymphocytes Relative: 27 %
Lymphs Abs: 0.3 10*3/uL — ABNORMAL LOW (ref 0.7–4.0)
MCH: 27.3 pg (ref 26.0–34.0)
MCHC: 30.6 g/dL (ref 30.0–36.0)
MCV: 89.2 fL (ref 80.0–100.0)
Monocytes Absolute: 0.1 10*3/uL (ref 0.1–1.0)
Monocytes Relative: 10 %
Neutro Abs: 0.7 10*3/uL — ABNORMAL LOW (ref 1.7–7.7)
Neutrophils Relative %: 56 %
Platelets: 89 10*3/uL — ABNORMAL LOW (ref 150–400)
RBC: 4.18 MIL/uL (ref 3.87–5.11)
RDW: 14.4 % (ref 11.5–15.5)
WBC: 1.2 10*3/uL — CL (ref 4.0–10.5)
nRBC: 0 % (ref 0.0–0.2)

## 2020-04-27 LAB — COMPREHENSIVE METABOLIC PANEL
ALT: 12 U/L (ref 0–44)
AST: 24 U/L (ref 15–41)
Albumin: 2.5 g/dL — ABNORMAL LOW (ref 3.5–5.0)
Alkaline Phosphatase: 42 U/L (ref 38–126)
Anion gap: 10 (ref 5–15)
BUN: 38 mg/dL — ABNORMAL HIGH (ref 8–23)
CO2: 22 mmol/L (ref 22–32)
Calcium: 8.6 mg/dL — ABNORMAL LOW (ref 8.9–10.3)
Chloride: 102 mmol/L (ref 98–111)
Creatinine, Ser: 1.05 mg/dL — ABNORMAL HIGH (ref 0.44–1.00)
GFR calc Af Amer: >60 mL/min (ref >60)
GFR calc non Af Amer: 55 mL/min — ABNORMAL LOW (ref >60)
Glucose, Bld: 133 mg/dL — ABNORMAL HIGH (ref 70–99)
Potassium: 5.2 mmol/L — ABNORMAL HIGH (ref 3.5–5.1)
Sodium: 134 mmol/L — ABNORMAL LOW (ref 135–145)
Total Bilirubin: 0.5 mg/dL (ref 0.3–1.2)
Total Protein: 5.3 g/dL — ABNORMAL LOW (ref 6.5–8.1)

## 2020-04-27 LAB — PHOSPHORUS: Phosphorus: 3.3 mg/dL (ref 2.5–4.6)

## 2020-04-27 LAB — MAGNESIUM: Magnesium: 2 mg/dL (ref 1.7–2.4)

## 2020-04-27 MED ORDER — METOPROLOL TARTRATE 5 MG/5ML IV SOLN
2.5000 mg | Freq: Four times a day (QID) | INTRAVENOUS | Status: DC
  Administered 2020-04-27 – 2020-05-02 (×19): 2.5 mg via INTRAVENOUS
  Filled 2020-04-27 (×19): qty 5

## 2020-04-27 MED ORDER — PANTOPRAZOLE SODIUM 40 MG IV SOLR
40.0000 mg | INTRAVENOUS | Status: DC
  Administered 2020-04-27 – 2020-05-11 (×15): 40 mg via INTRAVENOUS
  Filled 2020-04-27 (×15): qty 40

## 2020-04-27 MED ORDER — SODIUM CHLORIDE 0.9 % IV SOLN: INTRAVENOUS | Status: DC

## 2020-04-27 MED ORDER — TRAVASOL 10 % IV SOLN
INTRAVENOUS | Status: AC
  Filled 2020-04-27: qty 900

## 2020-04-27 NOTE — Progress Notes (Signed)
Nutrition Follow-up  DOCUMENTATION CODES:   Obesity unspecified  INTERVENTION:   -TPN management per Pharmacy -Will continue to monitor for progression in diet and tolerance  NUTRITION DIAGNOSIS:   Increased nutrient needs related to cancer and cancer related treatments as evidenced by estimated needs.  Ongoing.  GOAL:   Patient will meet greater than or equal to 90% of their needs  Meeting with TPN.  MONITOR:   PO intake, Supplement acceptance, Weight trends, I & O's, Skin, Labs  ASSESSMENT:   64 y.o. female past medical history of CAD status post angioplasty to the LAD, essential hypertension history of tobacco abuse she is currently non-smoking, GERD that comes into the hospital for 3 weeks of episodic diarrhea that has progressively gotten worse, she has not received any antibiotics over the last year, she is also been nauseated with loss of appetite and decreased oral intake despite this she has continued to take her antihypertensive medication, she does relate that on the day of admission she started having some dysuria  8/12: admitted 8/14: NPO-> Regular 8/15: Regular -> CLD 8/16: NPO -> soft diet 8/17: CLD 8/18: PICC placed 8/19: received first dose of chemo 8/21: CT showing progressive gaseous distention of the colon most consistent with ileus  Patient has continued to be on clear liquids, not taking in much. Has not been drinking Boost Breeze supplements. Now pt is having N/V x 3 episodes this morning. NGT was placed for LIS.  Per oncology note, plan at this time is to continue TPN. Will continue to monitor for ability to have diet advanced and tolerated.  Admission weight: 216 lbs Current weight (8/23): 216 lbs  I/Os: +4.9L since admit UOP: 950 ml x 24 hrs  Medications: IV Zofran  Labs reviewed: Low Na Elevated K Mg/Phos WNL  Diet Order:   Diet Order            Diet NPO time specified  Diet effective now                 EDUCATION NEEDS:    Not appropriate for education at this time  Skin:  Skin Assessment: Reviewed RN Assessment  Last BM:  8/21  Height:   Ht Readings from Last 1 Encounters:  04/13/20 5\' 4"  (1.626 m)    Weight:   Wt Readings from Last 1 Encounters:  04/24/20 98.2 kg   BMI:  Body mass index is 37.16 kg/m.  Estimated Nutritional Needs:   Kcal:  1700-1900  Protein:  80-90g  Fluid:  1.8L/day  Clayton Bibles, MS, RD, LDN Inpatient Clinical Dietitian Contact information available via Amion

## 2020-04-27 NOTE — Progress Notes (Addendum)
PROGRESS NOTE    Eileen Burgess  RDE:[id number] DOB: 12-13-1955 DOA: 04/13/2020 PCP: Patient, No Pcp Per   Brief Narrative:  Eileen Burgess is a 64 y.o.female with PMH CAD s/p angioplasty to the LAD, hypertension, history of tobacco abuse she is currently non-smoking, GERD that came to the hospital due to 3 weeks of episodic diarrhea that had progressively gotten worse, she has not received any antibiotics over the last year, she is also been nauseated with loss of appetite and decreased oral intake  She underwent a CT renal stone protocol which revealed an underlying malignancy involving the cervix and invading into the posterior bladder wall with obstruction of the right UVJ or distal right ureter with right hydronephrosis.  Urology was consulted and she has since undergone a ureteral stent on the right side due to ureteral obstruction.   She was also evaluated by oncology and underwent cervical biopsy.  Frozen section and formal pathology are both consistent with adenocarcinoma.  She underwent PICC line placement on 04/19/2020.  On 8/19, she underwent first dose of chemo and tolerated well. Later that night she developed respiratory distress due to volume overload from IVF and a CXR showed vascular congestion. IVF were discontinued and she was started on lasix.  She continued to have abdominal findings concerning for ileus however was not very symptomatic. Serial abdominal xrays showed ongoing colonic gaseous distension.  Due to prolonged ileus and no advancement of her diet, she was started on TPN.  Oncology closely following. She developed nausea and vomiting today so had to be put on NG tube.    Assessment & Plan:   Principal Problem:   Cervical cancer ([place]) Active Problems:   Essential hypertension   UTI (urinary tract infection)   AKI (acute kidney injury) (HCC)   Hyponatremia   Orthostatic hypotension   Hyponatremia syndrome  Abdominal pain with dysuria, AKI, right  hydronephrosis  Metastatic omental carcinomatosis.Cervical cancer-new diagnosis Bowel obstruction vs ileus -CT abdomen pelvis showed possible neoplastic processes arising from the bladder urothelium or from cervix and invading into the posterior bladder wall with obstruction of right UVJ or distal right ureter, moderate right hydronephrosis, diffuse omental nodularity and caking consistent with metastatic disease. Moderate right and small left pleural effusions, associated partial compressive atelectasis of lower lobes. Discussed with the patient, no prior history of malignancy (just had a history of ovarian cyst at the age of 70 years). -Urology consulted,s/pright ureteral stent placementon 8/14. Biopsy of the posterior bladder with intraoperative findings suggest extrinsic mass invading bladder rather than primary bladder malignancy.  -CT chest with contrast showed small pleural effusion left, moderate sized right pleural effusion, moderate amount of abdominal free fluid, moderate to marked severe right-sided hydronephrosis. No lung lesions. - frozen section consistent with adenocarcinoma; final path also shows adenocarcinoma from cervical biopsy -Received infusion of carboplatin/paclitaxel on 04/20/2020; PICC line placed 04/19/20 -She continued to have abdominal findings concerning for ileus however was not very symptomatic. Serial abdominal xrays showed ongoing colonic gaseous distension.  Due to prolonged ileus and no advancement of her diet, she was started on TPN -On 8/26,she developed nausea and vomiting today so had to be put on NG tube. Abd Xray showed mild gaseous distention of the proximal half the colon with absent gas distally suggesting ileus/distal colonic obstruction  Leukopenia/thrombocytopenia: Most likely associated  with malignancy, chemotherapy.  I will leave the decision for Granix up to oncology.  Continue to monitor CBC.  Heparin discontinued.  Low suspicion for  HIT.  Respiratory distress - resolved  - s/p rapid response evening of 04/20/20 - likely volume overload from multiple days of IVF -Diuresed well with Lasix on 04/21/2020.  Renal function mildly worsened, hold off on further diuresis at this time - re-discussed need for IVF for now given such poor oral intake; given IVF on 04/23/20; now with TPN being started, hopefully does not need further IVF -Currently she is on room air.  Hyponatremia, dehydration -resolved  Hyperkalemia: Mild.continue to monitor.Expect spontaneous resolution  Acute on CKDIII - baseline creat appears to be 1.1, but GFR in 2018 mid 50s -In the setting of #1, dehydration, HCTZ use -Continue to hold lisinopril, HCTZ -Status post ureteral stent -Currently kidney function stable  Hypertension -BP stable, continue metoprolol -continue metoprolol, avoid diuretics/ ACE inhibitor  CAD status post PTCA Continue metoprolol, aspirin  GERD Continue PPI  Obesity BMI of 37.  Constipation Continue MiraLAX, Senokot.  Patient denies taking any enema or suppository.  Nutrition Problem: Increased nutrient needs Etiology: cancer and cancer related treatments      DVT prophylaxis: Heparin [place] Code Status: Full code Family Communication:Discussed with son on phone on 04/27/20 Status is: Inpatient  Remains inpatient appropriate because:IV treatments appropriate due to intensity of illness or inability to take PO   Dispo: The patient is from: Home              Anticipated d/c is to: Home              Anticipated d/c date is: > 3 days              Patient currently is not medically stable to d/c.  On TPN. On NG tube. Needs oncology clearance before discharge.  Consultants: Oncology  Procedures: Ureteral stent placement, cervical biopsy  Antimicrobials:  Anti-infectives (From admission, onward)   Start     Dose/Rate Route Frequency Ordered Stop   04/15/20 1547  sodium chloride 0.9 % with cefTRIAXone  (ROCEPHIN) ADS Med       Note to Pharmacy: Nunzio Cobbs : cabinet override      04/15/20 1547 04/16/20 0359   04/13/20 1700  cefTRIAXone (ROCEPHIN) 1 g in sodium chloride 0.9 % 100 mL IVPB        1 g 200 mL/hr over 30 Minutes Intravenous Every 24 hours 04/13/20 1635 04/17/20 1659      Subjective:  Patient seen and examined at the bedside this morning.Complians of nausea and she threw couple of times.Doesnot feel well today.Abd Xary shows ileus,NG tube placed  Objective: Vitals:   04/26/20 1010 04/26/20 1552 04/26/20 2123 04/27/20 0623  BP: 127/80 (!) 128/96 110/80 132/90  Pulse: 100 (!) 106 (!) 103 (!) 104  Resp:  16 17 17   Temp:  98.4 F (36.9 C) 98 F (36.7 C) 98 F (36.7 C)  TempSrc:  Oral Oral Oral  SpO2:  96% 94% 96%  Weight:      Height:        Intake/Output Summary (Last 24 hours) at 04/27/2020 0826 Last data filed at 04/27/2020 0700 Gross per 24 hour  Intake 1868.43 ml  Output 1250 ml  Net 618.43 ml   Filed Weights   04/13/20 1234 04/24/20 0830  Weight: 98 kg 98.2 kg    Examination:  General exam: C/O abdominal discomfort Respiratory system: Bilateral equal air entry, normal vesicular breath sounds, no wheezes or crackles  Cardiovascular system: S1 & S2 heard, RRR. No JVD, murmurs, rubs, gallops or clicks. Gastrointestinal system: Abdomen is distended,  nontender,massed felt,very slow bowel sound  Central nervous system: Alert and oriented. No focal neurological deficits. Extremities: No edema, no clubbing ,no cyanosis Skin: No rashes, lesions or ulcers,no icterus ,no pallor   Data Reviewed: I have personally reviewed following labs and imaging studies  CBC: Recent Labs  Lab 04/23/20 0522 04/24/20 0618 04/25/20 0500 04/26/20 0545 04/27/20 0603  WBC 7.6 5.9 2.7* 1.4* 1.2*  NEUTROABS 7.1 5.2 2.2 0.9* 0.7*  HGB 11.6* 11.6* 11.4* 10.9* 11.4*  HCT 34.9* 34.9* 34.4* 33.2* 37.3  MCV 82.5 83.9 84.7 85.8 89.2  PLT 192 154 130* 98* 89*   Basic  Metabolic Panel: Recent Labs  Lab 04/23/20 0522 04/24/20 0618 04/25/20 0500 04/26/20 0545 04/27/20 0603  NA 133* 136 135 137 134*  K 4.2 4.3 4.3 4.6 5.2*  CL 101 102 101 102 102  CO2 21* 21* 23 24 22   GLUCOSE 99 99 133* 120* 133*  BUN 40* 41* 35* 33* 38*  CREATININE 1.40* 1.29* 1.15* 1.06* 1.05*  CALCIUM 8.3* 8.4* 8.3* 8.5* 8.6*  MG 2.2 2.1 2.0 2.1 2.0  PHOS  --  2.9 2.8 3.0 3.3   GFR: Estimated Creatinine Clearance: 60 mL/min (A) (by C-G formula based on SCr of 1.05 mg/dL (H)). Liver Function Tests: Recent Labs  Lab 04/23/20 0522 04/24/20 0618 04/25/20 0500 04/26/20 0545 04/27/20 0603  AST 26 24 23 21 24   ALT 15 16 15 13 12   ALKPHOS 42 40 37* 35* 42  BILITOT 0.8 1.0 0.7 0.4 0.5  PROT 5.5* 5.4* 5.4* 5.3* 5.3*  ALBUMIN 2.5* 2.6* 2.6* 2.4* 2.5*   No results for input(s): LIPASE, AMYLASE in the last 168 hours. No results for input(s): AMMONIA in the last 168 hours. Coagulation Profile: No results for input(s): INR, PROTIME in the last 168 hours. Cardiac Enzymes: No results for input(s): CKTOTAL, CKMB, CKMBINDEX, TROPONINI in the last 168 hours. BNP (last 3 results) No results for input(s): PROBNP in the last 8760 hours. HbA1C: No results for input(s): HGBA1C in the last 72 hours. CBG: Recent Labs  Lab 04/26/20 0021 04/26/20 0551 04/26/20 1552 04/26/20 2126 04/27/20 0629  GLUCAP 131* 129* 124* 136* 137*   Lipid Profile: Recent Labs    04/25/20 0500  TRIG 194*   Thyroid Function Tests: No results for input(s): TSH, T4TOTAL, FREET4, T3FREE, THYROIDAB in the last 72 hours. Anemia Panel: No results for input(s): VITAMINB12, FOLATE, FERRITIN, TIBC, IRON, RETICCTPCT in the last 72 hours. Sepsis Labs: No results for input(s): PROCALCITON, LATICACIDVEN in the last 168 hours.  No results found for this or any previous visit (from the past 240 hour(s)).       Radiology Studies: No results found.      Scheduled Meds: . Chlorhexidine Gluconate Cloth   6 each Topical Daily  . feeding supplement  1 Container Oral TID BM  . heparin  5,000 Units Subcutaneous Q8H  . insulin aspart  0-9 Units Subcutaneous Q8H  . metoprolol succinate  25 mg Oral Daily  . pantoprazole  40 mg Oral Daily  . polyethylene glycol  17 g Oral Daily  . senna-docusate  2 tablet Oral QHS  . sodium bicarbonate  650 mg Oral BID  . sodium chloride flush  10-40 mL Intracatheter Q12H   Continuous Infusions: . sodium chloride    . TPN ADULT (ION) 75 mL/hr at 04/27/20 0600     LOS: 13 days    Time spent: 35 mins,More than 50% of that time was spent in counseling and/or  coordination of care.      Shelly Coss, MD [facility] P8/26/2021, 8:26 AM

## 2020-04-27 NOTE — Progress Notes (Signed)
PHARMACY - TOTAL PARENTERAL NUTRITION CONSULT NOTE   Indication: Prolonged ileus  Patient Measurements: Height: 5\' 4"  (162.6 cm) Weight: 98.2 kg (216 lb 7.9 oz) IBW/kg (Calculated) : 54.7 TPN AdjBW (KG): 65.5 Body mass index is 37.16 kg/m. Usual Weight: 98 kg  Assessment: 64 y/o F found to have metastatic gynecologic cancer with carcinomatosis and ureteral obstruction s/p stent placement with minimal oral intake for over a week, possible ileus. Pharmacy consulted to initiate TPN.   Glucose / Insulin: CBGs 120-137 on sSSI q 4 hours (1 untis/24h) Electrolytes: WNL except K slightly elevated  Potassium (mmol/L)  Date Value  04/27/2020 5.2 (H)  04/26/2020 4.6  04/25/2020 4.3  Renal: SCr improved LFTs / TGs: WNL/ 194 (8/24) Prealbumin / albumin: 9.4 (8/24)/ 2.4 Intake / Output; MIVF: oral intake somewhat increased/ UOP 1250 mL + unmeasured occurrences GI Imaging: 8/21 abx XR: ileus vs obstruction Surgeries / Procedures: 8/14 ureteral stent placement   Central access: PICC 8/18 TPN start date: 8/23  Nutritional Goals (per RD recommendation on 8/23): kCal: 1700-1900, Protein: 80-90g, Fluid: 1.8 L/d Goal TPN rate is 75 mL/hr (provides 90 g of protein and 1728 kcals per day)   Current Nutrition:  TPN, CLD  Plan:  Continue TPN at goal rate of 75 mL/hr at 1800 providing 1728 kcal and 90 g protein Electrolytes in TPN: standard except remove K. Cl:Ac ratio 1:1 Add standard MVI and trace elements to TPN D/C insulin, FSBG No MIVF currently  Monitor TPN labs on Mon/Thur F/U AM labs  Ulice Dash D 04/27/2020,7:57 AM

## 2020-04-27 NOTE — Progress Notes (Addendum)
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, biopsy showed adenocarcinoma Received cycle 1 of carboplatin and paclitaxel on 04/20/2020 and tolerated well We will continue to monitor for side effects of treatment and monitor lab work closely Has developed mild pancytopenia secondary to chemotherapy Continue supportive care for now Will not start G-CSF unless she develops a fever of 101 or higher  Pancytopenia secondary to chemotherapy Pancytopenia is mild Monitor CBC with differential intermittently-does not need a daily CBC No need transfusion support, unless hemoglobin is less than 8 and platelet count less than 10 Will not start G-CSF unless she develops a fever of 101 or higher   Hyponatremia, resolved Appears to be due to to hypovolemia from poor p.o. intake and hydrochlorothiazide use Sodium level slightly low this morning Monitor closely   AKI due to hydronephrosis, improving Due to dehydration/poor p.o. intake HCTZ and lisinopril placed on hold Status post ureteral stent Creatinine stable   Protein calorie malnutrition Dietitian is following Continue TPN   Abdominal carcinomatosis, hypoactive sounds, nausea, imminent bowel obstruction She is on clear liquids only with very little p.o. intake Has now developed vomiting Abdominal x-ray confirmed persistent ileus Place NG tube to low intermittent wall suction   Hypertension Off lisinopril and hydrochlorothiazide secondary to AKI Hydralazine ordered as needed for systolic blood pressure greater than 160 Further management per hospitalist   CAD status post PTCA On metoprolol; aspirin on hold   Possible UTI versus pneumonia versus compression atelectasis Currently off antibiotics Monitor  closely for signs of infection  Recent shortness of breath/respiratory distress/vascular congestion with pulmonary edema, resolved Respiratory status improved  Currently off oxygen Monitor closely and further management per hospitalist   Goals of care discussion She is aware she have stage IV disease Treatment goal is palliative The goal for treatment in this hospital stay is to resolve her bowel obstruction prior to discharge   Discharge planning She will likely be here for the next 3 to 5 days Recommend ambulation as tolerated All questions were answered. The patient knows to call the clinic with any problems, questions or concerns.   Mikey Bussing, NP 04/27/2020 8:56 AM [name], MD  Subjective:  Bowels still have not moved No flatus Has developed vomiting this morning started around 5 AM Denies abdominal pain but abdomen still feels full/tight No fevers Breathing is stable  Objective:  Vitals:   04/26/20 2123 04/27/20 0623  BP: 110/80 132/90  Pulse: (!) 103 (!) 104  Resp: 17 17  Temp: 98 F (36.7 C) 98 F (36.7 C)  SpO2: 94% 96%     Intake/Output Summary (Last 24 hours) at 04/27/2020 0856 Last data filed at 04/27/2020 0848 Gross per 24 hour  Intake 1868.43 ml  Output 1300 ml  Net 568.43 ml    GENERAL:alert, no distress and comfortable RESP: Clear to auscultation bilaterally ABDOMEN:abdomen soft, mildly distended with ascites.  Hypoactive bowel sounds noted Musculoskeletal:no cyanosis of digits and no clubbing  NEURO: alert & oriented x 3 with fluent speech, no focal motor/sensory deficits   Labs:  Recent Labs    04/25/20 0500 04/26/20 0545 04/27/20 0603  NA 135 137 134*  K 4.3 4.6 5.2*  CL 101 102 102  CO2 23 24 22   GLUCOSE 133* 120* 133*  BUN  35* 33* 38*  CREATININE 1.15* 1.06* 1.05*  CALCIUM 8.3* 8.5* 8.6*  GFRNONAA 50* 55* 55*  GFRAA 57* >60 >60  PROT 5.4* 5.3* 5.3*  ALBUMIN 2.6* 2.4* 2.5*  AST 23 21 24   ALT 15 13 12   ALKPHOS 37* 35*  42  BILITOT 0.7 0.4 0.5    Studies: I have reviewed x-ray of her abdomen DG Chest 1 View  Result Date: 04/20/2020 CLINICAL DATA:  Increasing short of breath EXAM: CHEST  1 VIEW COMPARISON:  04/13/2020, CT 04/15/2020 FINDINGS: Right upper extremity central venous catheter tip over the SVC. Cardiomegaly with vascular congestion and diffuse interstitial opacity consistent with edema. Moderate right-sided pleural effusion, likely layering and resulting in hazy asymmetric appearance of the right thorax. At least small left pleural effusion. Bibasilar consolidations. No pneumothorax. IMPRESSION: 1. Cardiomegaly with vascular congestion and diffuse interstitial pulmonary edema. 2. Bilateral pleural effusions, at least moderate on the right and small on the left. 3. Bibasilar consolidations Electronically Signed   By: Donavan Foil M.D.   On: 04/20/2020 22:20   DG Chest 2 View  Result Date: 04/13/2020 CLINICAL DATA:  Weakness diarrhea EXAM: CHEST - 2 VIEW COMPARISON:  07/28/2004 FINDINGS: Heart size upper normal.  Vascularity normal.  Coronary stent noted. Small bilateral pleural effusions. Mild bibasilar atelectasis/infiltrate. IMPRESSION: Interval development of mild bibasilar airspace disease and small pleural effusions. Negative for heart failure. Electronically Signed   By: Franchot Gallo M.D.   On: 04/13/2020 13:57   CT CHEST W CONTRAST  Result Date: 04/15/2020 CLINICAL DATA:  Cancer of unknown primary, staging. EXAM: CT CHEST WITH CONTRAST TECHNIQUE: Multidetector CT imaging of the chest was performed during intravenous contrast administration. CONTRAST:  14mL OMNIPAQUE IOHEXOL 300 MG/ML  SOLN COMPARISON:  None. FINDINGS: Cardiovascular: There is mild calcification of the aortic arch. Normal heart size. No pericardial effusion. A coronary artery stent is seen. Mediastinum/Nodes: No enlarged mediastinal, hilar, or axillary lymph nodes. The thyroid gland and trachea demonstrate no significant findings.  There is a small hiatal hernia. Lungs/Pleura: Mild bilateral lower lobe atelectasis is seen, right slightly greater than left. Very mild slightly nodular appearing scarring and/or atelectasis is seen along the posterior aspect of the inferior left upper lobe. There is a small left pleural effusion. A moderate sized right pleural effusion is also noted. No pneumothorax is identified. Upper Abdomen: A moderate amount of abdominal free fluid is seen. There is moderate to marked severity right-sided hydronephrosis with delayed renal cortical enhancement involving the right kidney. Musculoskeletal: No chest wall abnormality. No acute or significant osseous findings. Degenerative changes seen throughout the thoracic spine. IMPRESSION: 1. Small left pleural effusion with a moderate sized right pleural effusion. 2. Mild bilateral lower lobe atelectasis, right slightly greater than left. 3. Very mild slightly nodular appearing scarring and/or atelectasis along the posterior aspect of the inferior left upper lobe. 4. Moderate amount of abdominal free fluid. 5. Moderate to marked severity right-sided hydronephrosis with delayed renal cortical enhancement involving the right kidney. This is suggestive of partial renal obstruction. 6. Small hiatal hernia. 7. Aortic atherosclerosis. Aortic Atherosclerosis (ICD10-I70.0). Electronically Signed   By: Virgina Norfolk M.D.   On: 04/15/2020 15:38   DG Abd Portable 2V  Result Date: 04/22/2020 CLINICAL DATA:  Decreased bowel sounds EXAM: PORTABLE ABDOMEN - 2 VIEW COMPARISON:  04/21/2020 FINDINGS: Supine frontal views of the abdomen and pelvis are obtained. Right ureteral stent is stable. There is increasing gaseous distention of the colon, most compatible with ileus. No evidence of  small-bowel obstruction. No masses or abnormal calcifications. IMPRESSION: 1. Progressive gaseous distention of the colon most consistent with ileus. 2. Stable right ureteral stent. Electronically Signed    By: Randa Ngo M.D.   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 Hypoactive bowel sounds. EXAM: PORTABLE ABDOMEN - 2 VIEW COMPARISON:  KUB 04/20/2020.  CT Abdomen and Pelvis 04/14/2020. FINDINGS: Upright and supine views of the abdomen and pelvis. Stable right double-J ureteral stent. Mildly to moderately gas distended large bowel again noted, with gas present to the mid sigmoid. As before, no dilated small bowel loops. But gas is increased since 04/14/2020. No pneumoperitoneum. Stable lung bases, right pleural effusion again evident. Stable visualized osseous structures. IMPRESSION: 1. Continued gas distended colon to the sigmoid, although no dilated small bowel to strongly suggest mechanical obstruction. Continue to favor ileus. 2. No free air. Stable right ureteral stent. Persistent right pleural effusion. Electronically Signed   By: Genevie Ann M.D.   On: 04/21/2020 13:54   DG Abd Portable 2V  Result Date: 04/20/2020 CLINICAL DATA:  Hypoactive bowel sounds. EXAM: PORTABLE ABDOMEN - 2 VIEW COMPARISON:  No prior. FINDINGS: Double-J right ureteral stent in good anatomic position. Colon is slightly distended. Cecum measures 8.9 cm in diameter. Although these findings most likely represent colonic ileus follow-up exam suggested to exclude colonic obstruction. No small bowel distention. No free air. Bibasilar atelectasis/infiltrates. Small right pleural effusion. IMPRESSION: 1.  Double-J right ureteral stent in good anatomic position. 2. Colon is slightly distended. Cecum measures 8.9 cm. Although these findings most likely represent colonic ileus, follow-up exam suggested to exclude colonic obstruction. No small bowel distention. No free air. 3.  Bibasilar atelectasis/infiltrates. Electronically Signed   By: Marcello Moores  Register   On: 04/20/2020 13:00   DG C-Arm 1-60 Min-No Report  Result Date:  04/15/2020 Fluoroscopy was utilized by the requesting physician.  No radiographic interpretation.   CT RENAL STONE STUDY  Result Date: 04/14/2020 CLINICAL DATA:  63 year old female with hematuria. EXAM: CT ABDOMEN AND PELVIS WITHOUT CONTRAST TECHNIQUE: Multidetector CT imaging of the abdomen and pelvis was performed following the standard protocol without IV contrast. COMPARISON:  None. FINDINGS: Evaluation of this exam is limited in the absence of intravenous contrast. Lower chest: Partially visualized moderate right and small left pleural effusions. There is associated partial compressive atelectasis of the lower lobes. Pneumonia is not excluded. Clinical correlation is recommended. There is coronary vascular calcification. Partially visualized trace pericardial effusion. There is no intra-abdominal free air. Small ascites. Hepatobiliary: No focal liver abnormality is seen. No gallstones, gallbladder wall thickening, or biliary dilatation. Pancreas: Unremarkable. No pancreatic ductal dilatation or surrounding inflammatory changes. Spleen: Normal in size without focal abnormality. Adrenals/Urinary Tract: The adrenal glands unremarkable. The left kidney is unremarkable. There is a moderate right hydronephrosis with mild right hydroureter. No stone identified. There is a transition point in the distal right ureter or right UVJ. The urinary bladder is minimally distended. There is possible mild nodular thickening of the posterior bladder wall adjacent to the right UVJ (77/2). There is loss of fat plane between the anterior lower uterus/cervix and posterior bladder wall. The lower uterus/cervical region is somewhat enlarged. Findings concerning for a neoplastic process either arising from the bladder urothelium or from the cervix and invading into the posterior bladder wall with obstruction of the right UVJ or distal right ureter. Stomach/Bowel: There is a  small hiatal hernia. There is no bowel obstruction. There  is sigmoid diverticulosis. The appendix is not visualized with certainty. No inflammatory changes identified in the right lower quadrant. Vascular/Lymphatic: Moderate aortoiliac atherosclerotic disease. The IVC is unremarkable. No portal venous gas. There is no adenopathy. Reproductive: The uterus is anteverted. Enlargement of the lower uterus/cervical region as described above. Other: There is diffuse omental nodularity and caking consistent with metastatic disease. Musculoskeletal: Osteopenia. No acute osseous pathology. IMPRESSION: 1. Findings concerning for a neoplastic process either arising from the bladder urothelium or from the cervix and invading into the posterior bladder wall with obstruction of the right UVJ or distal right ureter. There is associated moderate right hydronephrosis. Sampling of the ascitic fluid may provide further diagnostic information. 2. Diffuse omental nodularity and caking consistent with metastatic disease. 3. Moderate right and small left pleural effusions with associated partial compressive atelectasis of the lower lobes. Pneumonia is not excluded. Clinical correlation is recommended. 4. Sigmoid diverticulosis. No bowel obstruction. 5. Aortic Atherosclerosis (ICD10-I70.0). Electronically Signed   By: Anner Crete M.D.   On: 04/14/2020 17:44   [place] EKG SITE RITE  Result Date: 04/18/2020 If Site Rite image not attached, placement could not be confirmed due to current cardiac rhythm.

## 2020-04-27 NOTE — Progress Notes (Signed)
12 Days Post-Op Procedure(s) (LRB): CYSTOSCOPY WITH RETROGRADE PYELOGRAM/URETERAL STENT PLACEMENT (Right) bladder biopsy with fulguration of bleeders (N/A)  64 year old female currently admitted for metastatic cervical cancer s/p stent placement and cycle 1 of carboplatin and taxol on 04/20/2020.   Subjective: Patient reports throwing up 3 times with significant amounts starting this am. Reporting constant nausea. Productive cough reported as intermittent. Feels weak. Abdomen feels tight.  No flatus or BM. States her feet feel achy like after working a 10 hour shift. Denies chest pain, dyspnea. No other concerns voiced.  Objective: Vital signs in last 24 hours: Temp:  [98 F (36.7 C)-98.4 F (36.9 C)] 98 F (36.7 C) (08/26 0623) Pulse Rate:  [103-106] 104 (08/26 0623) Resp:  [16-17] 17 (08/26 0623) BP: (110-132)/(80-96) 132/90 (08/26 0623) SpO2:  [94 %-96 %] 96 % (08/26 0623) Last BM Date: 04/22/20  Intake/Output from previous day: 08/25 0701 - 08/26 0700 In: 3308.7 [P.O.:720; I.V.:2588.7] Out: 1250 [Urine:1250]  Physical Examination: General: alert, cooperative and no distress Resp: clear to auscultation bilaterally Cardio: regular rate and rhythm, S1, S2 normal, no murmur, click, rub or gallop GI: hyperactive bowel sounds with tinkling, abdomen distended/firm/tympanic-more from previous assessment Extremities: 1+ pitting edema noted bilaterally with lower extremities   Assisted to the bedside commode-pt able to move independently but slow due to weakness Light brown drainage noted on peri-pad but no active bleeding  Labs: WBC/Hgb/Hct/Plts:  1.2/11.4/37.3/89 (08/26 1410) BUN/Cr/glu/ALT/AST/amyl/lip:  38/1.05/--/12/24/--/-- (08/26 3013)  Assessment: 64 y.o. s/p Procedure(s): CYSTOSCOPY WITH RETROGRADE PYELOGRAM/URETERAL STENT PLACEMENT. S/P cycle 1 of carboplatin and taxol on 04/20/2020.  Pain:  Pain is well-controlled on PRN medications.  Heme: Hgb 11.4 and Hct 37.3 this  am-stable. PLT 89-expected s/p chemotherapy-continue to monitor.  ID: WBC count 1.2 this am with ANC 0.7-expected given recent chemotherapy-monitored by Dr. Alvy Bimler. No evidence of infection at this time.  CV: Mildly tachycardic. BP stable. Continue to monitor with ordered vital signs. Metoprolol ordered.  GI:  Tolerating po: no. Nausea with emesis. Abd xray ordered this am per Dr. Alvy Bimler. TPN running through PICC line. Last xray on 04/22/2020 resulting progressive gaseous distention of the colon most consistent with ileus. Consideration for NG tube after abdomen xray per Dr. Alvy Bimler.  GU: S/P stent placement. Creatinine 1.05 this am. 1100 cc urine output reported.  FEN: No critical values noted.  Prophylaxis: SCDs and heparin ordered.  Plan: Abd xray pending then consider NG tube placement Continue TPN per pharmacy Antiemetics PRN Continue plan of care per Dr. Alvy Bimler, Hospitalist, Dr. Berline Lopes   LOS: 13 days    Eileen Burgess 04/27/2020, 10:37 AM

## 2020-04-28 ENCOUNTER — Inpatient Hospital Stay (HOSPITAL_COMMUNITY): Payer: Medicare Other

## 2020-04-28 LAB — CBC WITH DIFFERENTIAL/PLATELET
Abs Immature Granulocytes: 0 10*3/uL (ref 0.00–0.07)
Basophils Absolute: 0 10*3/uL (ref 0.0–0.1)
Basophils Relative: 1 %
Eosinophils Absolute: 0 10*3/uL (ref 0.0–0.5)
Eosinophils Relative: 5 %
HCT: 31.1 % — ABNORMAL LOW (ref 36.0–46.0)
Hemoglobin: 9.9 g/dL — ABNORMAL LOW (ref 12.0–15.0)
Immature Granulocytes: 0 %
Lymphocytes Relative: 38 %
Lymphs Abs: 0.3 10*3/uL — ABNORMAL LOW (ref 0.7–4.0)
MCH: 27.5 pg (ref 26.0–34.0)
MCHC: 31.8 g/dL (ref 30.0–36.0)
MCV: 86.4 fL (ref 80.0–100.0)
Monocytes Absolute: 0.2 10*3/uL (ref 0.1–1.0)
Monocytes Relative: 28 %
Neutro Abs: 0.2 10*3/uL — ABNORMAL LOW (ref 1.7–7.7)
Neutrophils Relative %: 28 %
Platelets: 83 10*3/uL — ABNORMAL LOW (ref 150–400)
RBC: 3.6 MIL/uL — ABNORMAL LOW (ref 3.87–5.11)
RDW: 14 % (ref 11.5–15.5)
WBC: 0.8 10*3/uL — CL (ref 4.0–10.5)
nRBC: 0 % (ref 0.0–0.2)

## 2020-04-28 LAB — COMPREHENSIVE METABOLIC PANEL
ALT: 13 U/L (ref 0–44)
AST: 23 U/L (ref 15–41)
Albumin: 2.3 g/dL — ABNORMAL LOW (ref 3.5–5.0)
Alkaline Phosphatase: 47 U/L (ref 38–126)
Anion gap: 8 (ref 5–15)
BUN: 41 mg/dL — ABNORMAL HIGH (ref 8–23)
CO2: 22 mmol/L (ref 22–32)
Calcium: 8.3 mg/dL — ABNORMAL LOW (ref 8.9–10.3)
Chloride: 106 mmol/L (ref 98–111)
Creatinine, Ser: 1.05 mg/dL — ABNORMAL HIGH (ref 0.44–1.00)
GFR calc Af Amer: >60 mL/min (ref >60)
GFR calc non Af Amer: 55 mL/min — ABNORMAL LOW (ref >60)
Glucose, Bld: 135 mg/dL — ABNORMAL HIGH (ref 70–99)
Potassium: 4.5 mmol/L (ref 3.5–5.1)
Sodium: 136 mmol/L (ref 135–145)
Total Bilirubin: 0.5 mg/dL (ref 0.3–1.2)
Total Protein: 5.3 g/dL — ABNORMAL LOW (ref 6.5–8.1)

## 2020-04-28 LAB — PHOSPHORUS: Phosphorus: 3.8 mg/dL (ref 2.5–4.6)

## 2020-04-28 LAB — MAGNESIUM: Magnesium: 2 mg/dL (ref 1.7–2.4)

## 2020-04-28 LAB — GLUCOSE, CAPILLARY: Glucose-Capillary: 117 mg/dL — ABNORMAL HIGH (ref 70–99)

## 2020-04-28 MED ORDER — TRAVASOL 10 % IV SOLN
INTRAVENOUS | Status: AC
  Filled 2020-04-28: qty 900

## 2020-04-28 MED ORDER — INSULIN ASPART 100 UNIT/ML ~~LOC~~ SOLN
0.0000 [IU] | Freq: Three times a day (TID) | SUBCUTANEOUS | Status: DC
  Administered 2020-04-29 – 2020-05-09 (×24): 1 [IU] via SUBCUTANEOUS

## 2020-04-28 NOTE — Progress Notes (Addendum)
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, biopsy showed adenocarcinoma Received cycle 1 of carboplatin and paclitaxel on 04/20/2020 and tolerated well We will continue to monitor for side effects of treatment and monitor lab work closely Has developed pancytopenia secondary to chemotherapy Continue supportive care for now Will not start G-CSF unless she develops a fever of 101 or higher  Pancytopenia secondary to chemotherapy Monitor CBC with differential intermittently-does not need a daily CBC No need transfusion support, unless hemoglobin is less than 8 and platelet count less than 10 Will not start G-CSF unless she develops a fever of 101 or higher   Hyponatremia, resolved Appears to be due to to hypovolemia from poor p.o. intake and hydrochlorothiazide use Sodium level has normalized Monitor closely   AKI due to hydronephrosis, improving Due to dehydration/poor p.o. intake HCTZ and lisinopril placed on hold Status post ureteral stent Creatinine stable   Protein calorie malnutrition Dietitian is following Continue TPN   Abdominal carcinomatosis, hypoactive sounds, nausea, imminent bowel obstruction The patient was on clear liquids only with very little p.o. intake and subsequently developed vomiting Abdominal x-ray confirmed persistent ileus NG tube has been placed with resolution of vomiting We will continue NG tube until bowel function returns I recommend repeat x-ray of the abdomen on Monday  Intermittent cough Chest x-ray is ordered and reviewed Pulmonary edema has improved I suspect her cough is due to irritation of her throat from the NG tube If she has persistent ileus by Monday, we will try to get interventional radiologist at comfort her  NG tube into the G-tube We will wait until her [place] is at least 1500 before we order that   Hypertension Off lisinopril and hydrochlorothiazide secondary to AKI Hydralazine ordered as needed for systolic blood pressure greater than 160 Further management per hospitalist   CAD status post PTCA On metoprolol; aspirin on hold   Possible UTI versus pneumonia versus compression atelectasis Currently off antibiotics Monitor closely for signs of infection  Recent shortness of breath/respiratory distress/vascular congestion with pulmonary edema, resolved Respiratory status improved  Currently off oxygen Monitor closely and further management per hospitalist   Goals of care discussion She is aware she have stage IV disease Treatment goal is palliative The goal for treatment in this hospital stay is to resolve her bowel obstruction prior to discharge   Discharge planning She will likely be here for the next 3 to 5 days All questions were answered. The patient knows to call the clinic with any problems, questions or concerns. I will return to see her on Monday If questions arise this weekend, please contact oncologist on call   [name], MD 04/28/2020 1:21 PM [name], MD  Subjective:  NG tube drained about 200 cc overnight Extension tubing removed during my visit another 100 cc drained immediately No recurrent vomiting Abdomen still feels tight, but not as tight as it was yesterday Reports a sore throat secondary to NG tube Has Chloraseptic spray at the bedside Bowels still have not moved No flatus No fevers Has a cough and breathing is stable  Objective:  Vitals:   04/27/20 2018 04/28/20 0528  BP: 126/82 132/83  Pulse: (!) 101 (!) 104  Resp: 17 20  Temp: 98.4 F (36.9 C) 97.6 F (36.4 C)  SpO2: 95% 94%     Intake/Output Summary (Last 24 hours) at 04/28/2020 1321 Last data filed at 04/28/2020 1130 Gross per 24 hour  Intake 1110.71 ml  Output 1100 ml  Net 10.71 ml     GENERAL:alert, no distress and comfortable RESP: Clear to auscultation bilaterally ABDOMEN: Abdomen less distended this morning, mildly distended with ascites.  Hypoactive bowel sounds noted Musculoskeletal:no cyanosis of digits and no clubbing  NEURO: alert & oriented x 3 with fluent speech, no focal motor/sensory deficits   Labs:  Recent Labs    04/26/20 0545 04/27/20 0603 04/28/20 0604  NA 137 134* 136  K 4.6 5.2* 4.5  CL 102 102 106  CO2 24 22 22   GLUCOSE 120* 133* 135*  BUN 33* 38* 41*  CREATININE 1.06* 1.05* 1.05*  CALCIUM 8.5* 8.6* 8.3*  GFRNONAA 55* 55* 55*  GFRAA >60 >60 >60  PROT 5.3* 5.3* 5.3*  ALBUMIN 2.4* 2.5* 2.3*  AST 21 24 23   ALT 13 12 13   ALKPHOS 35* 42 47  BILITOT 0.4 0.5 0.5    Studies: I have reviewed x-ray of her abdomen DG Chest 1 View  Result Date: 04/20/2020 CLINICAL DATA:  Increasing short of breath EXAM: CHEST  1 VIEW COMPARISON:  04/13/2020, CT 04/15/2020 FINDINGS: Right upper extremity central venous catheter tip over the SVC. Cardiomegaly with vascular congestion and diffuse interstitial opacity consistent with edema. Moderate right-sided pleural effusion, likely layering and resulting in hazy asymmetric appearance of the right thorax. At least small left pleural effusion. Bibasilar consolidations. No pneumothorax. IMPRESSION: 1. Cardiomegaly with vascular congestion and diffuse interstitial pulmonary edema. 2. Bilateral pleural effusions, at least moderate on the right and small on the left. 3. Bibasilar consolidations Electronically Signed   By: Donavan Foil M.D.   On: 04/20/2020 22:20   DG Chest 2 View  Result Date: 04/13/2020 CLINICAL DATA:  Weakness diarrhea EXAM: CHEST - 2 VIEW COMPARISON:  07/28/2004 FINDINGS: Heart size upper normal.  Vascularity normal.  Coronary stent noted. Small bilateral pleural effusions. Mild bibasilar atelectasis/infiltrate. IMPRESSION: Interval development of mild bibasilar airspace disease and small pleural  effusions. Negative for heart failure. Electronically Signed   By: Franchot Gallo M.D.   On: 04/13/2020 13:57   DG Abd 1 View  Result Date: 04/27/2020 CLINICAL DATA:  Obstruction EXAM: ABDOMEN - 1 VIEW COMPARISON:  Portable exam 1047 hours compared 04/22/2020 FINDINGS: RIGHT ureteral stent unchanged. Gaseous distention of colon from tip of cecum through splenic flexure. Absent gas within descending and rectosigmoid colon. Small bowel gas pattern normal. No bowel wall thickening or pathologic calcification. IMPRESSION: Mild gaseous distention of the proximal half the colon with absent gas distally, could reflect ileus but unable to exclude distal colonic obstruction with this appearance. Electronically Signed   By: Lavonia Dana M.D.   On: 04/27/2020 11:01   CT CHEST W CONTRAST  Result Date: 04/15/2020 CLINICAL DATA:  Cancer of unknown primary, staging. EXAM: CT CHEST WITH CONTRAST TECHNIQUE: Multidetector CT imaging of the chest was performed during intravenous contrast administration. CONTRAST:  63mL OMNIPAQUE IOHEXOL 300 MG/ML  SOLN COMPARISON:  None. FINDINGS: Cardiovascular: There is mild calcification of the aortic arch. Normal heart size. No pericardial effusion. A coronary artery stent is seen. Mediastinum/Nodes: No enlarged mediastinal, hilar, or axillary lymph nodes. The thyroid gland and trachea demonstrate no significant findings. There is a small hiatal hernia. Lungs/Pleura: Mild bilateral lower lobe atelectasis is seen, right slightly greater than left. Very mild slightly nodular appearing scarring and/or atelectasis  is seen along the posterior aspect of the inferior left upper lobe. There is a small left pleural effusion. A moderate sized right pleural effusion is also noted. No pneumothorax is identified. Upper Abdomen: A moderate amount of abdominal free fluid is seen. There is moderate to marked severity right-sided hydronephrosis with delayed renal cortical enhancement involving the right  kidney. Musculoskeletal: No chest wall abnormality. No acute or significant osseous findings. Degenerative changes seen throughout the thoracic spine. IMPRESSION: 1. Small left pleural effusion with a moderate sized right pleural effusion. 2. Mild bilateral lower lobe atelectasis, right slightly greater than left. 3. Very mild slightly nodular appearing scarring and/or atelectasis along the posterior aspect of the inferior left upper lobe. 4. Moderate amount of abdominal free fluid. 5. Moderate to marked severity right-sided hydronephrosis with delayed renal cortical enhancement involving the right kidney. This is suggestive of partial renal obstruction. 6. Small hiatal hernia. 7. Aortic atherosclerosis. Aortic Atherosclerosis (ICD10-I70.0). Electronically Signed   By: Virgina Norfolk M.D.   On: 04/15/2020 15:38   DG Chest Port 1 View  Result Date: 04/28/2020 CLINICAL DATA:  64 year old female with persistent cough. Query fluid overload. EXAM: PORTABLE CHEST 1 VIEW COMPARISON:  Portable chest 04/20/2020 and earlier. FINDINGS: Portable AP semi upright view at 1214 hours. Stable right PICC line. Enteric tube has been placed and courses to the stomach, tip not included. Improved bilateral ventilation, but continued basilar predominant increased interstitial opacity, and dense retrocardiac opacity. Veiling opacity on the right is compatible with a superimposed pleural effusion. No pneumothorax. Upper lung pulmonary vascularity appears more normal now. Mediastinal contours are stable and within normal limits. Dense retrocardiac opacity without air bronchograms. Paucity of bowel gas. No acute osseous abnormality identified. IMPRESSION: 1. Suspect regression of pulmonary edema since 04/20/2020, but with residual vascular congestion, small right pleural effusion, and superimposed left lower lobe collapse or consolidation. 2. Enteric tube placed into the stomach, tip not included. Electronically Signed   By: Genevie Ann  M.D.   On: 04/28/2020 12:35   DG Abd Portable 1V  Result Date: 04/27/2020 CLINICAL DATA:  Nasogastric tube placement EXAM: PORTABLE ABDOMEN - 1 VIEW COMPARISON:  04/27/2020 FINDINGS: Interval placement of esophagogastric tube, tip and side port below the diaphragm, looped in the gastric fundus. Diffusely distended colon similar to prior examination. Partially imaged right double-J ureteral stent. IMPRESSION: 1. Interval placement of esophagogastric tube, tip and side port below the diaphragm, looped in the gastric fundus. 2. Diffusely distended colon similar to prior examination. Electronically Signed   By: Eddie Candle M.D.   On: 04/27/2020 13:33   DG Abd Portable 2V  Result Date: 04/22/2020 CLINICAL DATA:  Decreased bowel sounds EXAM: PORTABLE ABDOMEN - 2 VIEW COMPARISON:  04/21/2020 FINDINGS: Supine frontal views of the abdomen and pelvis are obtained. Right ureteral stent is stable. There is increasing gaseous distention of the colon, most compatible with ileus. No evidence of small-bowel obstruction. No masses or abnormal calcifications. IMPRESSION: 1. Progressive gaseous distention of the colon most consistent with ileus. 2. Stable right ureteral stent. Electronically Signed   By: Randa Ngo M.D.   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 Hypoactive bowel sounds. EXAM: PORTABLE ABDOMEN - 2 VIEW COMPARISON:  KUB 04/20/2020.  CT Abdomen and Pelvis 04/14/2020. FINDINGS: Upright and supine views of the abdomen and pelvis. Stable right double-J ureteral stent. Mildly to moderately gas distended  large bowel again noted, with gas present to the mid sigmoid. As before, no dilated small bowel loops. But gas is increased since 04/14/2020. No pneumoperitoneum. Stable lung bases, right pleural effusion again evident. Stable visualized osseous structures. IMPRESSION: 1. Continued gas distended  colon to the sigmoid, although no dilated small bowel to strongly suggest mechanical obstruction. Continue to favor ileus. 2. No free air. Stable right ureteral stent. Persistent right pleural effusion. Electronically Signed   By: Genevie Ann M.D.   On: 04/21/2020 13:54   DG Abd Portable 2V  Result Date: 04/20/2020 CLINICAL DATA:  Hypoactive bowel sounds. EXAM: PORTABLE ABDOMEN - 2 VIEW COMPARISON:  No prior. FINDINGS: Double-J right ureteral stent in good anatomic position. Colon is slightly distended. Cecum measures 8.9 cm in diameter. Although these findings most likely represent colonic ileus follow-up exam suggested to exclude colonic obstruction. No small bowel distention. No free air. Bibasilar atelectasis/infiltrates. Small right pleural effusion. IMPRESSION: 1.  Double-J right ureteral stent in good anatomic position. 2. Colon is slightly distended. Cecum measures 8.9 cm. Although these findings most likely represent colonic ileus, follow-up exam suggested to exclude colonic obstruction. No small bowel distention. No free air. 3.  Bibasilar atelectasis/infiltrates. Electronically Signed   By: Marcello Moores  Register   On: 04/20/2020 13:00   DG C-Arm 1-60 Min-No Report  Result Date: 04/15/2020 Fluoroscopy was utilized by the requesting physician.  No radiographic interpretation.   CT RENAL STONE STUDY  Result Date: 04/14/2020 CLINICAL DATA:  64 year old female with hematuria. EXAM: CT ABDOMEN AND PELVIS WITHOUT CONTRAST TECHNIQUE: Multidetector CT imaging of the abdomen and pelvis was performed following the standard protocol without IV contrast. COMPARISON:  None. FINDINGS: Evaluation of this exam is limited in the absence of intravenous contrast. Lower chest: Partially visualized moderate right and small left pleural effusions. There is associated partial compressive atelectasis of the lower lobes. Pneumonia is not excluded. Clinical correlation is recommended. There is coronary vascular calcification.  Partially visualized trace pericardial effusion. There is no intra-abdominal free air. Small ascites. Hepatobiliary: No focal liver abnormality is seen. No gallstones, gallbladder wall thickening, or biliary dilatation. Pancreas: Unremarkable. No pancreatic ductal dilatation or surrounding inflammatory changes. Spleen: Normal in size without focal abnormality. Adrenals/Urinary Tract: The adrenal glands unremarkable. The left kidney is unremarkable. There is a moderate right hydronephrosis with mild right hydroureter. No stone identified. There is a transition point in the distal right ureter or right UVJ. The urinary bladder is minimally distended. There is possible mild nodular thickening of the posterior bladder wall adjacent to the right UVJ (77/2). There is loss of fat plane between the anterior lower uterus/cervix and posterior bladder wall. The lower uterus/cervical region is somewhat enlarged. Findings concerning for a neoplastic process either arising from the bladder urothelium or from the cervix and invading into the posterior bladder wall with obstruction of the right UVJ or distal right ureter. Stomach/Bowel: There is a small hiatal hernia. There is no bowel obstruction. There is sigmoid diverticulosis. The appendix is not visualized with certainty. No inflammatory changes identified in the right lower quadrant. Vascular/Lymphatic: Moderate aortoiliac atherosclerotic disease. The IVC is unremarkable. No portal venous gas. There is no adenopathy. Reproductive: The uterus is anteverted. Enlargement of the lower uterus/cervical region as described above. Other: There is diffuse omental nodularity and caking consistent with metastatic disease. Musculoskeletal: Osteopenia. No acute osseous pathology. IMPRESSION: 1. Findings concerning for a neoplastic process either arising from the bladder urothelium or from the cervix and invading into the posterior bladder  wall with obstruction of the right UVJ or distal  right ureter. There is associated moderate right hydronephrosis. Sampling of the ascitic fluid may provide further diagnostic information. 2. Diffuse omental nodularity and caking consistent with metastatic disease. 3. Moderate right and small left pleural effusions with associated partial compressive atelectasis of the lower lobes. Pneumonia is not excluded. Clinical correlation is recommended. 4. Sigmoid diverticulosis. No bowel obstruction. 5. Aortic Atherosclerosis (ICD10-I70.0). Electronically Signed   By: Anner Crete M.D.   On: 04/14/2020 17:44   [place] EKG SITE RITE  Result Date: 04/18/2020 If Site Rite image not attached, placement could not be confirmed due to current cardiac rhythm.

## 2020-04-28 NOTE — Progress Notes (Signed)
PHARMACY - TOTAL PARENTERAL NUTRITION CONSULT NOTE   Indication: Prolonged ileus  Patient Measurements: Height: 5\' 4"  (162.6 cm) Weight: 98.2 kg (216 lb 7.9 oz) IBW/kg (Calculated) : 54.7 TPN AdjBW (KG): 65.5 Body mass index is 37.16 kg/m. Usual Weight: 98 kg  Assessment: 64 y/o F found to have metastatic gynecologic cancer with carcinomatosis and ureteral obstruction s/p stent placement with minimal oral intake for over a week, possible ileus. Pharmacy consulted to manage TPN.   Glucose / Insulin: CBGs 128-135 stable, WNL. Goal CBG 100-150. No SSI/24 hours.  Electrolytes: WNL including CorrCa 9.7 -Given concern for possible ileus, target K>4, Mg >2 Renal: SCr improved & stable. BUN elevated but stable LFTs / TGs: WNL/ TG 194 (8/24) Prealbumin / albumin: 9.4 (8/24)/ 2.3 Intake / Output; MIVF: UOP 800 mL, NG output 200 mL; I/O net: +110 mL. MIVF: NaCl @ 75 mL/hr GI Imaging: 8/21 abx XR: ileus vs obstruction 8/26 Abd x-ray: possible ileus, unable to exclude distal colonic obstruction Surgeries / Procedures: 8/14 ureteral stent placement   Significant Events: -8/26: N/V, NGT placed. CLD > NPO  Central access: PICC 8/18 TPN start date: 8/23  Nutritional Goals (per RD recommendation on 8/26): kCal: 1700-1900, Protein: 80-90g, Fluid: 1.8 L/d  Goal TPN rate is 75 mL/hr (provides 90 g of protein and 1728 kcals per day); meeting >90% of patient needs  Current Nutrition:  TPN, NPO  Plan:   Continue TPN at goal rate of 75 mL/hr at 1800 providing 1728 kcal and 90 g protein  Electrolytes in TPN: Add K back to TPN  Na 9mEq/L, K 56mEq/L, Ca 92mEq/L, Mg 5 mEq/L, Phos 15 mmol/L  Cl:Ac ratio 1:1  Add standard MVI and trace elements to TPN  CBG check + sSSI q8h  MIVF: NaCl @ 75 mL/hr per MD   Monitor TPN labs on Mon/Thursday. Recheck electrolytes with AM labs tomorrow  Monitor for ability to advance diet  Lenis Noon, PharmD 04/28/2020,9:08 AM

## 2020-04-28 NOTE — Progress Notes (Signed)
GYN Oncology Follow Up  Patient reports sore throat from NG tube.  No nausea reported this am. Small episode of emesis overnight.  No flatus or BM reported. States her abdomen remains tight but less than yesterday. No pain reported. No phlegm reported with cough this am. States her feet and legs feel better today. No concerns voiced.  Alert, oriented x3. Lungs clear. Frequent coughing during time in the room.  Abdomen remains distended, slightly softer from yesterday's assessment, hypoactive bowel sounds, tympanic on percussion. NG with 300 in the canister after removing the extension tubing that was hindering adequate suction.  Lower extremity edema is less.  Continue plan of care per Hospitalist, Dr. Alvy Bimler, GYN [place].  Continue with TPN as ordered. Continue to monitor labs with changes post-chemotherapy. Will discuss cough with Dr. Alvy Bimler -last chest xray on 04/20/20 showing cardiomegaly with vascular congestion and diffuse interstitial pulmonary edema, bilateral pleural effusions, at least moderate on the right and small on the left, Bibasilar consolidations.

## 2020-04-28 NOTE — Progress Notes (Signed)
PROGRESS NOTE    XAVIERA FLATEN  QJF:[id number] DOB: [birth year] DOA: 04/13/2020 PCP: Patient, No Pcp Per   Brief Narrative:  Ms. Algeo is a 64 y.o.female with PMH CAD s/p angioplasty to the LAD, hypertension, history of tobacco abuse she is currently non-smoking, GERD that came to the hospital due to 3 weeks of episodic diarrhea that had progressively gotten worse, she has not received any antibiotics over the last year, she is also been nauseated with loss of appetite and decreased oral intake  She underwent a CT renal stone protocol which revealed an underlying malignancy involving the cervix and invading into the posterior bladder wall with obstruction of the right UVJ or distal right ureter with right hydronephrosis.  Urology was consulted and she has since undergone a ureteral stent on the right side due to ureteral obstruction.   She was also evaluated by oncology and underwent cervical biopsy.  Frozen section and formal pathology are both consistent with adenocarcinoma.  She underwent PICC line placement on 04/19/2020.  On 8/19, she underwent first dose of chemo and tolerated well. Later that night she developed respiratory distress due to volume overload from IVF and a CXR showed vascular congestion. IVF were discontinued and she was started on lasix.  She continued to have abdominal findings concerning for ileus however was not very symptomatic. Serial abdominal xrays showed ongoing colonic gaseous distension.  Due to prolonged ileus and no advancement of her diet, she was started on TPN.  Oncology closely following. She developed nausea and vomiting today so had to be put on NG tube.    Assessment & Plan:   Principal Problem:   Cervical cancer ([place]) Active Problems:   Essential hypertension   UTI (urinary tract infection)   AKI (acute kidney injury) (HCC)   Hyponatremia   Orthostatic hypotension   Hyponatremia syndrome  Abdominal pain with dysuria, AKI, right  hydronephrosis  Metastatic omental carcinomatosis.Cervical cancer-new diagnosis Bowel obstruction vs ileus -CT abdomen pelvis showed possible neoplastic processes arising from the bladder urothelium or from cervix and invading into the posterior bladder wall with obstruction of right UVJ or distal right ureter, moderate right hydronephrosis, diffuse omental nodularity and caking consistent with metastatic disease. Moderate right and small left pleural effusions, associated partial compressive atelectasis of lower lobes. Discussed with the patient, no prior history of malignancy (just had a history of ovarian cyst at the age of 62 years). -Urology consulted,s/pright ureteral stent placementon 8/14. Biopsy of the posterior bladder with intraoperative findings suggest extrinsic mass invading bladder rather than primary bladder malignancy.  -CT chest with contrast showed small pleural effusion left, moderate sized right pleural effusion, moderate amount of abdominal free fluid, moderate to marked severe right-sided hydronephrosis. No lung lesions. - frozen section consistent with adenocarcinoma; final path also shows adenocarcinoma from cervical biopsy -Received infusion of carboplatin/paclitaxel on 04/20/2020; PICC line placed 04/19/20 -She continued to have abdominal findings concerning for ileus however was not very symptomatic. Serial abdominal xrays showed ongoing colonic gaseous distension.  Due to prolonged ileus and no advancement of her diet, she was started on TPN -On 8/26,she developed nausea and vomiting today so had to be put on NG tube. Abd Xray showed mild gaseous distention of the proximal half the colon with absent gas distally suggesting ileus/distal colonic obstruction  Leukopenia/thrombocytopenia: Most likely associated  with malignancy, chemotherapy.  I will leave the decision for Granix up to oncology.  Continue to monitor CBC.  Heparin discontinued.  Low suspicion for  HIT.  Respiratory distress - resolved  - s/p rapid response evening of 04/20/20 - likely volume overload from multiple days of IVF -Diuresed well with Lasix on 04/21/2020.  Renal function mildly worsened, hold off on further diuresis at this time - re-discussed need for IVF for now given such poor oral intake; given IVF on 04/23/20; now with TPN being started, hopefully does not need further IVF -Currently she is on room air.  Hyponatremia, dehydration -resolved  Hyperkalemia: Mild.continue to monitor.Expect spontaneous resolution  Acute on CKDIII - baseline creat appears to be 1.1, but GFR in 2018 mid 50s -In the setting of #1, dehydration, HCTZ use -Continue to hold lisinopril, HCTZ -Status post ureteral stent -Currently kidney function stable  Hypertension -BP stable, continue metoprolol -continue metoprolol, avoid diuretics/ ACE inhibitor  CAD status post PTCA Continue metoprolol, aspirin  GERD Continue PPI  Obesity BMI of 37.  Constipation Continue MiraLAX, Senokot.  Patient denies taking any enema or suppository.  Nutrition Problem: Increased nutrient needs Etiology: cancer and cancer related treatments      DVT prophylaxis: Heparin  Code Status: Full code Family Communication:Discussed with son on phone on 04/27/20 Status is: Inpatient  Remains inpatient appropriate because:IV treatments appropriate due to intensity of illness or inability to take PO   Dispo: The patient is from: Home              Anticipated d/c is to: Home              Anticipated d/c date is: > 3 days              Patient currently is not medically stable to d/c.  On TPN. On NG tube. Needs oncology clearance before discharge.  Consultants: Oncology  Procedures: Ureteral stent placement, cervical biopsy  Antimicrobials:  Anti-infectives (From admission, onward)   Start     Dose/Rate Route Frequency Ordered Stop   04/15/20 1547  sodium chloride 0.9 % with cefTRIAXone  (ROCEPHIN) ADS Med       Note to Pharmacy: Nunzio Cobbs : cabinet override      04/15/20 1547 04/16/20 0359   04/13/20 1700  cefTRIAXone (ROCEPHIN) 1 g in sodium chloride 0.9 % 100 mL IVPB        1 g 200 mL/hr over 30 Minutes Intravenous Every 24 hours 04/13/20 1635 04/17/20 1659      Subjective:  Patient seen and examined the bedside this morning.  She feels more comfortable today.  Her abdomen is still distended but she has not vomited or felt abdominal pain.  She feels better with NG tube.  She denies passing gas or having bowel movement.  Objective: Vitals:   04/27/20 0623 04/27/20 1334 04/27/20 2018 04/28/20 0528  BP: 132/90 (!) 130/92 126/82 132/83  Pulse: (!) 104 99 (!) 101 (!) 104  Resp: 17 16 17 20   Temp: 98 F (36.7 C) 98 F (36.7 C) 98.4 F (36.9 C) 97.6 F (36.4 C)  TempSrc: Oral Oral Oral Oral  SpO2: 96% 95% 95% 94%  Weight:      Height:        Intake/Output Summary (Last 24 hours) at 04/28/2020 0839 Last data filed at 04/28/2020 0520 Gross per 24 hour  Intake 1110.71 ml  Output 1000 ml  Net 110.71 ml   Filed Weights   04/13/20 1234 04/24/20 0830  Weight: 98 kg 98.2 kg    Examination:   General exam: Comfortable, morbidly obese HEENT: NG tube Respiratory system: Bilateral equal air entry, normal  vesicular breath sounds, no wheezes or crackles  Cardiovascular system: S1 & S2 heard, RRR. No JVD, murmurs, rubs, gallops or clicks. Gastrointestinal system: Abdomen is distended, intra-abdominal masses felt.  Very slow bowel sounds Central nervous system: Alert and oriented. No focal neurological deficits. Extremities: No edema, no clubbing ,no cyanosis Skin: No rashes, lesions or ulcers,no icterus ,no pallor  Data Reviewed: I have personally reviewed following labs and imaging studies  CBC: Recent Labs  Lab 04/24/20 0618 04/25/20 0500 04/26/20 0545 04/27/20 0603 04/28/20 0604  WBC 5.9 2.7* 1.4* 1.2* 0.8*  NEUTROABS 5.2 2.2 0.9* 0.7* 0.2*   HGB 11.6* 11.4* 10.9* 11.4* 9.9*  HCT 34.9* 34.4* 33.2* 37.3 31.1*  MCV 83.9 84.7 85.8 89.2 86.4  PLT 154 130* 98* 89* 83*   Basic Metabolic Panel: Recent Labs  Lab 04/24/20 0618 04/25/20 0500 04/26/20 0545 04/27/20 0603 04/28/20 0604  NA 136 135 137 134* 136  K 4.3 4.3 4.6 5.2* 4.5  CL 102 101 102 102 106  CO2 21* 23 24 22 22   GLUCOSE 99 133* 120* 133* 135*  BUN 41* 35* 33* 38* 41*  CREATININE 1.29* 1.15* 1.06* 1.05* 1.05*  CALCIUM 8.4* 8.3* 8.5* 8.6* 8.3*  MG 2.1 2.0 2.1 2.0 2.0  PHOS 2.9 2.8 3.0 3.3 3.8   GFR: Estimated Creatinine Clearance: 60 mL/min (A) (by C-G formula based on SCr of 1.05 mg/dL (H)). Liver Function Tests: Recent Labs  Lab 04/24/20 0618 04/25/20 0500 04/26/20 0545 04/27/20 0603 04/28/20 0604  AST 24 23 21 24 23   ALT 16 15 13 12 13   ALKPHOS 40 37* 35* 42 47  BILITOT 1.0 0.7 0.4 0.5 0.5  PROT 5.4* 5.4* 5.3* 5.3* 5.3*  ALBUMIN 2.6* 2.6* 2.4* 2.5* 2.3*   No results for input(s): LIPASE, AMYLASE in the last 168 hours. No results for input(s): AMMONIA in the last 168 hours. Coagulation Profile: No results for input(s): INR, PROTIME in the last 168 hours. Cardiac Enzymes: No results for input(s): CKTOTAL, CKMB, CKMBINDEX, TROPONINI in the last 168 hours. BNP (last 3 results) No results for input(s): PROBNP in the last 8760 hours. HbA1C: No results for input(s): HGBA1C in the last 72 hours. CBG: Recent Labs  Lab 04/26/20 0551 04/26/20 1552 04/26/20 2126 04/27/20 0629 04/27/20 1516  GLUCAP 129* 124* 136* 137* 128*   Lipid Profile: No results for input(s): CHOL, HDL, LDLCALC, TRIG, CHOLHDL, LDLDIRECT in the last 72 hours. Thyroid Function Tests: No results for input(s): TSH, T4TOTAL, FREET4, T3FREE, THYROIDAB in the last 72 hours. Anemia Panel: No results for input(s): VITAMINB12, FOLATE, FERRITIN, TIBC, IRON, RETICCTPCT in the last 72 hours. Sepsis Labs: No results for input(s): PROCALCITON, LATICACIDVEN in the last 168 hours.  No  results found for this or any previous visit (from the past 240 hour(s)).       Radiology Studies: DG Abd 1 View  Result Date: 04/27/2020 CLINICAL DATA:  Obstruction EXAM: ABDOMEN - 1 VIEW COMPARISON:  Portable exam 1047 hours compared 04/22/2020 FINDINGS: RIGHT ureteral stent unchanged. Gaseous distention of colon from tip of cecum through splenic flexure. Absent gas within descending and rectosigmoid colon. Small bowel gas pattern normal. No bowel wall thickening or pathologic calcification. IMPRESSION: Mild gaseous distention of the proximal half the colon with absent gas distally, could reflect ileus but unable to exclude distal colonic obstruction with this appearance. Electronically Signed   By: Lavonia Dana M.D.   On: 04/27/2020 11:01   DG Abd Portable 1V  Result Date: 04/27/2020 CLINICAL DATA:  Nasogastric tube placement EXAM: PORTABLE ABDOMEN - 1 VIEW COMPARISON:  04/27/2020 FINDINGS: Interval placement of esophagogastric tube, tip and side port below the diaphragm, looped in the gastric fundus. Diffusely distended colon similar to prior examination. Partially imaged right double-J ureteral stent. IMPRESSION: 1. Interval placement of esophagogastric tube, tip and side port below the diaphragm, looped in the gastric fundus. 2. Diffusely distended colon similar to prior examination. Electronically Signed   By: Eddie Candle M.D.   On: 04/27/2020 13:33        Scheduled Meds: . Chlorhexidine Gluconate Cloth  6 each Topical Daily  . feeding supplement  1 Container Oral TID BM  . metoprolol tartrate  2.5 mg Intravenous Q6H  . pantoprazole (PROTONIX) IV  40 mg Intravenous Q24H  . sodium chloride flush  10-40 mL Intracatheter Q12H   Continuous Infusions: . sodium chloride    . sodium chloride 75 mL/hr at 04/28/20 0600  . TPN ADULT (ION) 75 mL/hr at 04/27/20 1850     LOS: 14 days    Time spent: 35 mins,More than 50% of that time was spent in counseling and/or coordination of  care.      Shelly Coss, MD [facility] P8/27/2021, 8:39 AM

## 2020-04-29 ENCOUNTER — Inpatient Hospital Stay (HOSPITAL_COMMUNITY): Payer: Medicare Other

## 2020-04-29 LAB — BASIC METABOLIC PANEL
Anion gap: 10 (ref 5–15)
BUN: 47 mg/dL — ABNORMAL HIGH (ref 8–23)
CO2: 22 mmol/L (ref 22–32)
Calcium: 8.4 mg/dL — ABNORMAL LOW (ref 8.9–10.3)
Chloride: 105 mmol/L (ref 98–111)
Creatinine, Ser: 1.07 mg/dL — ABNORMAL HIGH (ref 0.44–1.00)
GFR calc Af Amer: >60 mL/min (ref >60)
GFR calc non Af Amer: 54 mL/min — ABNORMAL LOW (ref >60)
Glucose, Bld: 142 mg/dL — ABNORMAL HIGH (ref 70–99)
Potassium: 4.3 mmol/L (ref 3.5–5.1)
Sodium: 137 mmol/L (ref 135–145)

## 2020-04-29 LAB — CBC WITH DIFFERENTIAL/PLATELET
Abs Immature Granulocytes: 0.02 10*3/uL (ref 0.00–0.07)
Basophils Absolute: 0 10*3/uL (ref 0.0–0.1)
Basophils Relative: 1 %
Eosinophils Absolute: 0 10*3/uL (ref 0.0–0.5)
Eosinophils Relative: 3 %
HCT: 31.9 % — ABNORMAL LOW (ref 36.0–46.0)
Hemoglobin: 10 g/dL — ABNORMAL LOW (ref 12.0–15.0)
Immature Granulocytes: 2 %
Lymphocytes Relative: 28 %
Lymphs Abs: 0.4 10*3/uL — ABNORMAL LOW (ref 0.7–4.0)
MCH: 27.2 pg (ref 26.0–34.0)
MCHC: 31.3 g/dL (ref 30.0–36.0)
MCV: 86.7 fL (ref 80.0–100.0)
Monocytes Absolute: 0.6 10*3/uL (ref 0.1–1.0)
Monocytes Relative: 45 %
Neutro Abs: 0.3 10*3/uL — ABNORMAL LOW (ref 1.7–7.7)
Neutrophils Relative %: 21 %
Platelets: 87 10*3/uL — ABNORMAL LOW (ref 150–400)
RBC: 3.68 MIL/uL — ABNORMAL LOW (ref 3.87–5.11)
RDW: 14.2 % (ref 11.5–15.5)
WBC: 1.3 10*3/uL — CL (ref 4.0–10.5)
nRBC: 0 % (ref 0.0–0.2)

## 2020-04-29 LAB — GLUCOSE, CAPILLARY
Glucose-Capillary: 130 mg/dL — ABNORMAL HIGH (ref 70–99)
Glucose-Capillary: 131 mg/dL — ABNORMAL HIGH (ref 70–99)
Glucose-Capillary: 132 mg/dL — ABNORMAL HIGH (ref 70–99)

## 2020-04-29 LAB — MAGNESIUM: Magnesium: 2 mg/dL (ref 1.7–2.4)

## 2020-04-29 LAB — PHOSPHORUS: Phosphorus: 3.6 mg/dL (ref 2.5–4.6)

## 2020-04-29 MED ORDER — TRAVASOL 10 % IV SOLN
INTRAVENOUS | Status: AC
  Filled 2020-04-29: qty 900

## 2020-04-29 MED ORDER — TRAVASOL 10 % IV SOLN
INTRAVENOUS | Status: DC
  Filled 2020-04-29: qty 900

## 2020-04-29 NOTE — Progress Notes (Signed)
CRITICAL VALUE ALERT  Critical Value: WBC 1.3  Date & Time Notied:  04/29/20 at 9:27 am  Provider Notified: Shelly Coss, MD  Orders Received/Actions taken:

## 2020-04-29 NOTE — Progress Notes (Signed)
PROGRESS NOTE    Eileen Burgess  BZJ:[id number] DOB: 01-02-1956 DOA: 04/13/2020 PCP: Patient, No Pcp Per   Brief Narrative:  Eileen Burgess is a 64 y.o.female with PMH CAD s/p angioplasty to the LAD, hypertension, history of tobacco abuse she is currently non-smoking, GERD that came to the hospital due to 3 weeks of episodic diarrhea that had progressively gotten worse, she has not received any antibiotics over the last year, she is also been nauseated with loss of appetite and decreased oral intake  She underwent a CT renal stone protocol which revealed an underlying malignancy involving the cervix and invading into the posterior bladder wall with obstruction of the right UVJ or distal right ureter with right hydronephrosis.  Urology was consulted and she has since undergone a ureteral stent on the right side due to ureteral obstruction.   She was also evaluated by oncology and underwent cervical biopsy.  Frozen section and formal pathology are both consistent with adenocarcinoma.  She underwent PICC line placement on 04/19/2020.  On 8/19, she underwent first dose of chemo and tolerated well. Later that night she developed respiratory distress due to volume overload from IVF and a CXR showed vascular congestion. IVF were discontinued and she was started on lasix.  She continued to have abdominal findings concerning for ileus however was not very symptomatic. Serial abdominal xrays showed ongoing colonic gaseous distension.  Due to prolonged ileus and no advancement of her diet, she was started on TPN.  Oncology closely following. She developed nausea and vomiting on 04/27/20 so had to be put on NG tube.     Assessment & Plan:   Principal Problem:   Cervical cancer ([place]) Active Problems:   Essential hypertension   UTI (urinary tract infection)   AKI (acute kidney injury) (HCC)   Hyponatremia   Orthostatic hypotension   Hyponatremia syndrome  Abdominal pain with dysuria, AKI, right  hydronephrosis  Metastatic omental carcinomatosis.Cervical cancer-new diagnosis Bowel obstruction vs ileus -CT abdomen pelvis showed possible neoplastic processes arising from the bladder urothelium or from cervix and invading into the posterior bladder wall with obstruction of right UVJ or distal right ureter, moderate right hydronephrosis, diffuse omental nodularity and caking consistent with metastatic disease. Moderate right and small left pleural effusions, associated partial compressive atelectasis of lower lobes. Discussed with the patient, no prior history of malignancy (just had a history of ovarian cyst at the age of 59 years). -Urology consulted,s/pright ureteral stent placementon 8/14. Biopsy of the posterior bladder with intraoperative findings suggest extrinsic mass invading bladder rather than primary bladder malignancy.  -CT chest with contrast showed small pleural effusion left, moderate sized right pleural effusion, moderate amount of abdominal free fluid, moderate to marked severe right-sided hydronephrosis. No lung lesions. - frozen section consistent with adenocarcinoma; final path also shows adenocarcinoma from cervical biopsy -Received infusion of carboplatin/paclitaxel on 04/20/2020; PICC line placed 04/19/20 -She continued to have abdominal findings concerning for ileus however was not very symptomatic. Serial abdominal xrays showed ongoing colonic gaseous distension.  Due to prolonged ileus and no advancement of her diet, she was started on TPN -On 8/26,she developed nausea and vomiting today so had to be put on NG tube. Abd Xray done on 04/28/20 showed continued air-filled prominent colon may represent ileus.  Leukopenia/thrombocytopenia: Most likely associated  with malignancy, chemotherapy.  I will leave the decision for Granix up to oncology.  Continue to monitor CBC.  Heparin discontinued.  Low suspicion for HIT.  Hyponatremia,  dehydration -resolved  Hyperkalemia: Mild.continue to monitor.Expect spontaneous resolution  Acute on CKDIII - baseline creat appears to be 1.1, but GFR in 2018 mid 50s -In the setting of #1, dehydration, HCTZ use -Continue to hold lisinopril, HCTZ -Status post ureteral stent -Currently kidney function stable  Hypertension -BP stable, continue metoprolol -continue metoprolol, avoid diuretics/ ACE inhibitor  CAD status post PTCA Continue metoprolol, aspirin  GERD Continue PPI  Obesity BMI of 37.  Nutrition Problem: Increased nutrient needs Etiology: cancer and cancer related treatments      DVT prophylaxis: Heparin [place] Code Status: Full code Family Communication:Discussed with son on phone on 04/27/20 Status is: Inpatient  Remains inpatient appropriate because:IV treatments appropriate due to intensity of illness or inability to take PO   Dispo: The patient is from: Home              Anticipated d/c is to: Home              Anticipated d/c date is: > 3 days              Patient currently is not medically stable to d/c.  On TPN. On NG tube. Needs oncology clearance before discharge.  Consultants: Oncology  Procedures: Ureteral stent placement, cervical biopsy  Antimicrobials:  Anti-infectives (From admission, onward)   Start     Dose/Rate Route Frequency Ordered Stop   04/15/20 1547  sodium chloride 0.9 % with cefTRIAXone (ROCEPHIN) ADS Med       Note to Pharmacy: Nunzio Cobbs : cabinet override      04/15/20 1547 04/16/20 0359   04/13/20 1700  cefTRIAXone (ROCEPHIN) 1 g in sodium chloride 0.9 % 100 mL IVPB        1 g 200 mL/hr over 30 Minutes Intravenous Every 24 hours 04/13/20 1635 04/17/20 1659      Subjective:  Patient seen and examined the bedside this morning.  She feels awful today.  She had multiple episodes of vomiting.  Abdomen remains distended.  Abdominal x-ray done this morning showed persistent ileus.  NG tube draining dark  green fluid.  Objective: Vitals:   04/28/20 1437 04/28/20 1957 04/29/20 0014 04/29/20 0538  BP: (!) 143/91 135/73  125/70  Pulse: 97 (!) 106 98 97  Resp: 19 16  14   Temp: 98.3 F (36.8 C) 98.7 F (37.1 C) (!) 97.4 F (36.3 C) 98.2 F (36.8 C)  TempSrc: Oral Oral Oral Oral  SpO2: 95% 98% 96% 95%  Weight:      Height:        Intake/Output Summary (Last 24 hours) at 04/29/2020 0807 Last data filed at 04/29/2020 0539 Gross per 24 hour  Intake --  Output 1800 ml  Net -1800 ml   Filed Weights   04/13/20 1234 04/24/20 0830  Weight: 98 kg 98.2 kg    Examination:   General exam: Not comfortable due to ongoing nausea, vomiting HEENT: NG tube draining dark green fluid Respiratory system: Bilateral diminished air sounds on the bases  cardiovascular system: S1 & S2 heard, RRR. No JVD, murmurs, rubs, gallops or clicks. Gastrointestinal system: Abdomen is distended, soft and nontender. No organomegaly or masses felt.  bowel sounds heard but slow. Central nervous system: Alert and oriented. No focal neurological deficits. Extremities: No edema, no clubbing ,no cyanosis Skin: No rashes, lesions or ulcers,no icterus ,no pallor   Data Reviewed: I have personally reviewed following labs and imaging studies  CBC: Recent Labs  Lab 04/24/20 0618 04/25/20 0500 04/26/20 0545 04/27/20 0603 04/28/20  0604  WBC 5.9 2.7* 1.4* 1.2* 0.8*  NEUTROABS 5.2 2.2 0.9* 0.7* 0.2*  HGB 11.6* 11.4* 10.9* 11.4* 9.9*  HCT 34.9* 34.4* 33.2* 37.3 31.1*  MCV 83.9 84.7 85.8 89.2 86.4  PLT 154 130* 98* 89* 83*   Basic Metabolic Panel: Recent Labs  Lab 04/24/20 0618 04/25/20 0500 04/26/20 0545 04/27/20 0603 04/28/20 0604  NA 136 135 137 134* 136  K 4.3 4.3 4.6 5.2* 4.5  CL 102 101 102 102 106  CO2 21* 23 24 22 22   GLUCOSE 99 133* 120* 133* 135*  BUN 41* 35* 33* 38* 41*  CREATININE 1.29* 1.15* 1.06* 1.05* 1.05*  CALCIUM 8.4* 8.3* 8.5* 8.6* 8.3*  MG 2.1 2.0 2.1 2.0 2.0  PHOS 2.9 2.8 3.0 3.3  3.8   GFR: Estimated Creatinine Clearance: 60 mL/min (A) (by C-G formula based on SCr of 1.05 mg/dL (H)). Liver Function Tests: Recent Labs  Lab 04/24/20 0618 04/25/20 0500 04/26/20 0545 04/27/20 0603 04/28/20 0604  AST 24 23 21 24 23   ALT 16 15 13 12 13   ALKPHOS 40 37* 35* 42 47  BILITOT 1.0 0.7 0.4 0.5 0.5  PROT 5.4* 5.4* 5.3* 5.3* 5.3*  ALBUMIN 2.6* 2.6* 2.4* 2.5* 2.3*   No results for input(s): LIPASE, AMYLASE in the last 168 hours. No results for input(s): AMMONIA in the last 168 hours. Coagulation Profile: No results for input(s): INR, PROTIME in the last 168 hours. Cardiac Enzymes: No results for input(s): CKTOTAL, CKMB, CKMBINDEX, TROPONINI in the last 168 hours. BNP (last 3 results) No results for input(s): PROBNP in the last 8760 hours. HbA1C: No results for input(s): HGBA1C in the last 72 hours. CBG: Recent Labs  Lab 04/27/20 0629 04/27/20 1516 04/28/20 1551 04/29/20 0013 04/29/20 0752  GLUCAP 137* 128* 117* 130* 131*   Lipid Profile: No results for input(s): CHOL, HDL, LDLCALC, TRIG, CHOLHDL, LDLDIRECT in the last 72 hours. Thyroid Function Tests: No results for input(s): TSH, T4TOTAL, FREET4, T3FREE, THYROIDAB in the last 72 hours. Anemia Panel: No results for input(s): VITAMINB12, FOLATE, FERRITIN, TIBC, IRON, RETICCTPCT in the last 72 hours. Sepsis Labs: No results for input(s): PROCALCITON, LATICACIDVEN in the last 168 hours.  No results found for this or any previous visit (from the past 240 hour(s)).       Radiology Studies: DG Abd 1 View  Result Date: 04/27/2020 CLINICAL DATA:  Obstruction EXAM: ABDOMEN - 1 VIEW COMPARISON:  Portable exam 1047 hours compared 04/22/2020 FINDINGS: RIGHT ureteral stent unchanged. Gaseous distention of colon from tip of cecum through splenic flexure. Absent gas within descending and rectosigmoid colon. Small bowel gas pattern normal. No bowel wall thickening or pathologic calcification. IMPRESSION: Mild gaseous  distention of the proximal half the colon with absent gas distally, could reflect ileus but unable to exclude distal colonic obstruction with this appearance. Electronically Signed   By: Lavonia Dana M.D.   On: 04/27/2020 11:01   DG Chest Port 1 View  Result Date: 04/28/2020 CLINICAL DATA:  64 year old female with persistent cough. Query fluid overload. EXAM: PORTABLE CHEST 1 VIEW COMPARISON:  Portable chest 04/20/2020 and earlier. FINDINGS: Portable AP semi upright view at 1214 hours. Stable right PICC line. Enteric tube has been placed and courses to the stomach, tip not included. Improved bilateral ventilation, but continued basilar predominant increased interstitial opacity, and dense retrocardiac opacity. Veiling opacity on the right is compatible with a superimposed pleural effusion. No pneumothorax. Upper lung pulmonary vascularity appears more normal now. Mediastinal contours are stable  and within normal limits. Dense retrocardiac opacity without air bronchograms. Paucity of bowel gas. No acute osseous abnormality identified. IMPRESSION: 1. Suspect regression of pulmonary edema since 04/20/2020, but with residual vascular congestion, small right pleural effusion, and superimposed left lower lobe collapse or consolidation. 2. Enteric tube placed into the stomach, tip not included. Electronically Signed   By: Genevie Ann M.D.   On: 04/28/2020 12:35   DG Abd Portable 1V  Result Date: 04/27/2020 CLINICAL DATA:  Nasogastric tube placement EXAM: PORTABLE ABDOMEN - 1 VIEW COMPARISON:  04/27/2020 FINDINGS: Interval placement of esophagogastric tube, tip and side port below the diaphragm, looped in the gastric fundus. Diffusely distended colon similar to prior examination. Partially imaged right double-J ureteral stent. IMPRESSION: 1. Interval placement of esophagogastric tube, tip and side port below the diaphragm, looped in the gastric fundus. 2. Diffusely distended colon similar to prior examination.  Electronically Signed   By: Eddie Candle M.D.   On: 04/27/2020 13:33        Scheduled Meds: . Chlorhexidine Gluconate Cloth  6 each Topical Daily  . feeding supplement  1 Container Oral TID BM  . insulin aspart  0-9 Units Subcutaneous Q8H  . metoprolol tartrate  2.5 mg Intravenous Q6H  . pantoprazole (PROTONIX) IV  40 mg Intravenous Q24H  . sodium chloride flush  10-40 mL Intracatheter Q12H   Continuous Infusions: . sodium chloride    . sodium chloride 75 mL/hr at 04/28/20 0600  . TPN ADULT (ION) 75 mL/hr at 04/28/20 1705     LOS: 15 days    Time spent: 35 mins,More than 50% of that time was spent in counseling and/or coordination of care.      Shelly Coss, MD [facility] P8/28/2021, 8:07 AM

## 2020-04-29 NOTE — Progress Notes (Signed)
PHARMACY - TOTAL PARENTERAL NUTRITION CONSULT NOTE   Indication: Prolonged ileus  Patient Measurements: Height: 5\' 4"  (162.6 cm) Weight: 98.2 kg (216 lb 7.9 oz) IBW/kg (Calculated) : 54.7 TPN AdjBW (KG): 65.5 Body mass index is 37.16 kg/m. Usual Weight: 98 kg  Assessment: 63 y/o F found to have metastatic gynecologic cancer with carcinomatosis and ureteral obstruction s/p stent placement with minimal oral intake for over a week, possible ileus. Pharmacy consulted to manage TPN.   Glucose / Insulin: CBGs 117-131 stable, WNL. Goal CBG 100-150. 2 units SSI/24 hours.  Electrolytes: WNL including CorrCa 9.7 -Given concern for possible ileus, target K>4, Mg >2 Renal: SCr elevated & stable. BUN elevated but stable LFTs / TGs: WNL/ TG 194 (8/24) Prealbumin / albumin: 9.4 (8/24)/ 2.3 Intake / Output; MIVF: UOP 1558mL, NG output 250 mL; (netI/O not accurate) MIVF: NaCl @ 75 mL/hr GI Imaging: 8/21 abx XR: ileus vs obstruction 8/26 Abd x-ray: possible ileus, unable to exclude distal colonic obstruction Surgeries / Procedures: 8/14 ureteral stent placement  8/30  repeat x-ray of the abdomen Significant Events: -8/26: N/V, NGT placed. CLD > NPO  Central access: PICC 8/18 TPN start date: 8/23  Nutritional Goals (per RD recommendation on 8/26): kCal: 1700-1900, Protein: 80-90g, Fluid: 1.8 L/d Goal TPN rate is 75 mL/hr (provides 90 g of protein and 1728 kcals per day); meeting >90% of patient needs  Current Nutrition:  TPN, NPO  Plan:   Continue TPN at goal rate of 75 mL/hr at 1800 providing 1728 kcal and 90 g protein  Electrolytes in TPN:   Na 81mEq/L,continue K 33mEq/L, Ca 24mEq/L, Mg 5 mEq/L, Phos 15 mmol/L  Cl:Ac ratio 1:1  Add standard MVI and trace elements to TPN  CBG check + sSSI q8h  MIVF: NaCl @ 75 mL/hr per MD   Monitor TPN labs on Mon/Thursday. Recheck electrolytes with AM labs tomorrow  Monitor for ability to advance diet  Gretta Arab PharmD, BCPS Clinical  Pharmacist [facility] [phone] 04/29/2020 9:12 AM

## 2020-04-30 ENCOUNTER — Inpatient Hospital Stay (HOSPITAL_COMMUNITY): Payer: Medicare Other

## 2020-04-30 LAB — GLUCOSE, CAPILLARY
Glucose-Capillary: 135 mg/dL — ABNORMAL HIGH (ref 70–99)
Glucose-Capillary: 145 mg/dL — ABNORMAL HIGH (ref 70–99)
Glucose-Capillary: 128 mg/dL — ABNORMAL HIGH (ref 70–99)
Glucose-Capillary: 131 mg/dL — ABNORMAL HIGH (ref 70–99)
Glucose-Capillary: 141 mg/dL — ABNORMAL HIGH (ref 70–99)

## 2020-04-30 LAB — BASIC METABOLIC PANEL
Anion gap: 12 (ref 5–15)
BUN: 53 mg/dL — ABNORMAL HIGH (ref 8–23)
CO2: 18 mmol/L — ABNORMAL LOW (ref 22–32)
Calcium: 8.5 mg/dL — ABNORMAL LOW (ref 8.9–10.3)
Chloride: 102 mmol/L (ref 98–111)
Creatinine, Ser: 1.38 mg/dL — ABNORMAL HIGH (ref 0.44–1.00)
GFR calc Af Amer: 46 mL/min — ABNORMAL LOW (ref >60)
GFR calc non Af Amer: 40 mL/min — ABNORMAL LOW (ref >60)
Glucose, Bld: 952 mg/dL (ref 70–99)
Potassium: 5.6 mmol/L — ABNORMAL HIGH (ref 3.5–5.1)
Sodium: 132 mmol/L — ABNORMAL LOW (ref 135–145)

## 2020-04-30 LAB — MAGNESIUM: Magnesium: 2.4 mg/dL (ref 1.7–2.4)

## 2020-04-30 LAB — PHOSPHORUS: Phosphorus: 7.2 mg/dL — ABNORMAL HIGH (ref 2.5–4.6)

## 2020-04-30 MED ORDER — TRAVASOL 10 % IV SOLN
INTRAVENOUS | Status: AC
  Filled 2020-04-30: qty 900

## 2020-04-30 MED ORDER — TRAVASOL 10 % IV SOLN: INTRAVENOUS | Status: DC

## 2020-04-30 MED ORDER — GUAIFENESIN-DM 100-10 MG/5ML PO SYRP: 10.0000 mL | ORAL_SOLUTION | Freq: Four times a day (QID) | ORAL | Status: DC | PRN

## 2020-04-30 MED ORDER — IPRATROPIUM-ALBUTEROL 0.5-2.5 (3) MG/3ML IN SOLN
3.0000 mL | Freq: Four times a day (QID) | RESPIRATORY_TRACT | Status: DC
  Administered 2020-04-30 – 2020-05-02 (×9): 3 mL via RESPIRATORY_TRACT
  Filled 2020-04-30 (×9): qty 3

## 2020-04-30 MED ORDER — SODIUM ZIRCONIUM CYCLOSILICATE 10 G PO PACK
10.0000 g | PACK | Freq: Once | ORAL | Status: AC
  Administered 2020-04-30: 10 g via ORAL
  Filled 2020-04-30: qty 1

## 2020-04-30 MED ORDER — FUROSEMIDE 10 MG/ML IJ SOLN
40.0000 mg | Freq: Once | INTRAMUSCULAR | Status: AC
  Administered 2020-04-30: 40 mg via INTRAVENOUS
  Filled 2020-04-30: qty 4

## 2020-04-30 NOTE — Plan of Care (Signed)

## 2020-04-30 NOTE — Plan of Care (Signed)
  Problem: Education: Goal: Knowledge of General Education information will improve Description: Including pain rating scale, medication(s)/side effects and non-pharmacologic comfort measures 04/30/2020 1935 by Audrea Muscat, RN Outcome: Progressing 04/30/2020 1403 by Audrea Muscat, RN Outcome: Progressing   Problem: Health Behavior/Discharge Planning: Goal: Ability to manage health-related needs will improve 04/30/2020 1935 by Audrea Muscat, RN Outcome: Progressing 04/30/2020 1403 by Audrea Muscat, RN Outcome: Progressing   Problem: Clinical Measurements: Goal: Ability to maintain clinical measurements within normal limits will improve 04/30/2020 1935 by Audrea Muscat, RN Outcome: Progressing 04/30/2020 1403 by Audrea Muscat, RN Outcome: Progressing Goal: Will remain free from infection 04/30/2020 1935 by Audrea Muscat, RN Outcome: Progressing 04/30/2020 1403 by Audrea Muscat, RN Outcome: Progressing Goal: Diagnostic test results will improve 04/30/2020 1935 by Audrea Muscat, RN Outcome: Progressing 04/30/2020 1403 by Audrea Muscat, RN Outcome: Progressing Goal: Respiratory complications will improve 04/30/2020 1935 by Audrea Muscat, RN Outcome: Progressing 04/30/2020 1403 by Audrea Muscat, RN Outcome: Progressing Goal: Cardiovascular complication will be avoided 04/30/2020 1935 by Audrea Muscat, RN Outcome: Progressing 04/30/2020 1403 by Audrea Muscat, RN Outcome: Progressing   Problem: Activity: Goal: Risk for activity intolerance will decrease 04/30/2020 1935 by Audrea Muscat, RN Outcome: Progressing 04/30/2020 1403 by Audrea Muscat, RN Outcome: Progressing   Problem: Nutrition: Goal: Adequate nutrition will be maintained 04/30/2020 1935 by Audrea Muscat, RN Outcome: Progressing 04/30/2020 1403 by Audrea Muscat, RN Outcome: Progressing   Problem: Coping: Goal: Level of anxiety will decrease 04/30/2020 1935 by Audrea Muscat, RN Outcome: Progressing 04/30/2020 1403 by Audrea Muscat, RN Outcome: Progressing   Problem: Elimination: Goal: Will not experience complications related to bowel motility 04/30/2020 1935 by Audrea Muscat, RN Outcome: Progressing 04/30/2020 1403 by Audrea Muscat, RN Outcome: Progressing Goal: Will not experience complications related to urinary retention 04/30/2020 1935 by Audrea Muscat, RN Outcome: Progressing 04/30/2020 1403 by Audrea Muscat, RN Outcome: Progressing   Problem: Pain Managment: Goal: General experience of comfort will improve 04/30/2020 1935 by Audrea Muscat, RN Outcome: Progressing 04/30/2020 1403 by Audrea Muscat, RN Outcome: Progressing   Problem: Safety: Goal: Ability to remain free from injury will improve 04/30/2020 1935 by Audrea Muscat, RN Outcome: Progressing 04/30/2020 1403 by Audrea Muscat, RN Outcome: Progressing   Problem: Skin Integrity: Goal: Risk for impaired skin integrity will decrease 04/30/2020 1935 by Audrea Muscat, RN Outcome: Progressing 04/30/2020 1403 by Audrea Muscat, RN Outcome: Progressing

## 2020-04-30 NOTE — Progress Notes (Signed)
Physical Therapy Treatment Patient Details Name: Eileen Burgess MRN: [id number] DOB: 1956-01-02 Today's Date: 04/30/2020    History of Present Illness 64 yo female admitted with UTI. Imaging (+) pelvic mass. Now with ileus. S/P R ureteral stent placement, biopsy 8/14. Hx of CAD,MI, obesity    PT Comments    Pt was agreeable to working with PT although she stated she felt terrible. Min-Mod assist for mobility on today. Per chart, pt now has an ileus. Also noted bil LE edema. Pt was able to stand and take a few steps in the room. She exhibits weakness and decreased activity tolerance on today. Will continue to follow and progress activity as tolerated.     Follow Up Recommendations  Home health PT;Supervision for mobility/OOB     Equipment Recommendations  Rolling walker with 5" wheels    Recommendations for Other Services       Precautions / Restrictions Precautions Precautions: Fall Precaution Comments: NG tube Restrictions Weight Bearing Restrictions: No    Mobility  Bed Mobility Overal bed mobility: Needs Assistance Bed Mobility: Supine to Sit;Sit to Supine     Supine to sit: Min assist;HOB elevated Sit to supine: Mod assist   General bed mobility comments: Assist for trunk and bil LEs. Increased time. Pt relied on bedrails and HOB elevation  Transfers Overall transfer level: Needs assistance Equipment used: Rolling walker (2 wheeled) Transfers: Sit to/from Stand Sit to Stand: Min assist;From elevated surface         General transfer comment: Assist to power up, stabilize, control descent. Pt c/o feelling weak. Noted UE shaking in standing  Ambulation/Gait Ambulation/Gait assistance: Min assist   Assistive device: Rolling walker (2 wheeled)       General Gait Details: Pt took a step forwards then backwards before taking several side steps towards HOB. Assist to stabilize. Pt fatigues easily.   Stairs             Wheelchair Mobility     Modified Rankin (Stroke Patients Only)       Balance Overall balance assessment: Needs assistance         Standing balance support: Bilateral upper extremity supported Standing balance-Leahy Scale: Poor                              Cognition Arousal/Alertness: Awake/alert Behavior During Therapy: WFL for tasks assessed/performed Overall Cognitive Status: Within Functional Limits for tasks assessed                                        Exercises      General Comments        Pertinent Vitals/Pain Pain Assessment: Faces Faces Pain Scale: Hurts little more Pain Location: chest/abdomen/back Pain Descriptors / Indicators: Discomfort;Grimacing Pain Intervention(s): Limited activity within patient's tolerance;Monitored during session;Repositioned    Home Living                      Prior Function            PT Goals (current goals can now be found in the care plan section) Progress towards PT goals: Progressing toward goals    Frequency    Min 3X/week      PT Plan Current plan remains appropriate    Co-evaluation  AM-PAC PT "6 Clicks" Mobility   Outcome Measure  Help needed turning from your back to your side while in a flat bed without using bedrails?: A Little Help needed moving from lying on your back to sitting on the side of a flat bed without using bedrails?: A Little Help needed moving to and from a bed to a chair (including a wheelchair)?: A Little Help needed standing up from a chair using your arms (e.g., wheelchair or bedside chair)?: A Little Help needed to walk in hospital room?: A Lot Help needed climbing 3-5 steps with a railing? : A Lot 6 Click Score: 16    End of Session Equipment Utilized During Treatment: Gait belt Activity Tolerance: Patient tolerated treatment well Patient left: in bed;with call bell/phone within reach;with bed alarm set   PT Visit Diagnosis: Muscle  weakness (generalized) (M62.81);Unsteadiness on feet (R26.81)     Time: 1426-1450 PT Time Calculation (min) (ACUTE ONLY): 24 min  Charges:  $Gait Training: 8-22 mins $Therapeutic Activity: 8-22 mins                         Doreatha Massed, PT Acute Rehabilitation  Office: [phone] Pager: [phone]

## 2020-04-30 NOTE — Progress Notes (Signed)
Patient transferred to 1424 for telemetry. Report called to [facility].

## 2020-04-30 NOTE — Progress Notes (Addendum)
PHARMACY - TOTAL PARENTERAL NUTRITION CONSULT NOTE   Indication: Prolonged ileus  Patient Measurements: Height: 5\' 4"  (162.6 cm) Weight: 98.2 kg (216 lb 7.9 oz) IBW/kg (Calculated) : 54.7 TPN AdjBW (KG): 65.5 Body mass index is 37.16 kg/m. Usual Weight: 98 kg  Assessment: 64 y/o F found to have metastatic gynecologic cancer with carcinomatosis and ureteral obstruction s/p stent placement with minimal oral intake for over a week, possible ileus. Pharmacy consulted to manage TPN.   Glucose / Insulin: CBGs 128-145.  Goal CBG 100-150. 4 units SSI/24 hours.  Electrolytes: WNL (8/28) -Given concern for possible ileus, target K>4, Mg >2 Renal: SCr elevated & stable. BUN elevated but stable LFTs / TGs: WNL/ TG 194 (8/24) Prealbumin / albumin: 9.4 (8/24)/ 2.3 Intake / Output; MIVF: UOP and NG output not recorded.  Net I/O not accurate. MIVF: NaCl @ 75 mL/hr GI Imaging: 8/21 abx XR: ileus vs obstruction 8/26 Abd x-ray: possible ileus, unable to exclude distal colonic obstruction 8/28 Abd xray: Ureteral stent is stable.  Continued air-filled prominent colon may represent ileus Surgeries / Procedures: 8/14 ureteral stent placement  Significant Events: -8/26: N/V, NGT placed. CLD > NPO  Central access: PICC 8/18 TPN start date: 8/23  Nutritional Goals (per RD recommendation on 8/26): kCal: 1700-1900, Protein: 80-90g, Fluid: 1.8 L/d Goal TPN rate is 75 mL/hr (provides 90 g of protein and 1821 kcals per day); meeting >100% of patient needs Note:  IntraLipid product ordered in TPN from 8/23-8/28.  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  I was informed on 8/28 that SMOFlipids had been used to make the TPN from 8/23-8/27 despite the Intralipid orders and that Intralipid was not available for the TPN on 8/28.  The order was changed from Intralipid to SMOFlipids.  Current Nutrition:  TPN, NPO  Plan:   Continue TPN at goal rate of 75 mL/hr  Electrolytes in TPN:   Na  14mEq/L,continue K 26mEq/L, Ca 71mEq/L, Mg 5 mEq/L, Phos 15 mmol/L  Cl:Ac ratio 1:1  Add standard MVI and trace elements to TPN  CBG check + sSSI q8h  MIVF: NaCl @ 75 mL/hr per MD   Monitor TPN labs on Mon/Thursday.  Monitor for ability to advance diet  Gretta Arab PharmD, BCPS Clinical Pharmacist [facility] [phone] 04/30/2020 8:25 AM

## 2020-04-30 NOTE — Progress Notes (Signed)
PROGRESS NOTE    Eileen Burgess  HUD:[id number] DOB: 10-15-1955 DOA: 04/13/2020 PCP: Patient, No Pcp Per   Brief Narrative:  [name] is a 64 y.o.female with PMH CAD s/p angioplasty to the LAD, hypertension, history of tobacco abuse she is currently non-smoking, GERD that came to the hospital due to 3 weeks of episodic diarrhea that had progressively gotten worse, she has not received any antibiotics over the last year, she is also been nauseated with loss of appetite and decreased oral intake  She underwent a CT renal stone protocol which revealed an underlying malignancy involving the cervix and invading into the posterior bladder wall with obstruction of the right UVJ or distal right ureter with right hydronephrosis.  Urology was consulted and she has since undergone a ureteral stent on the right side due to ureteral obstruction.   She was also evaluated by oncology and underwent cervical biopsy.  Frozen section and formal pathology are both consistent with adenocarcinoma.  She underwent PICC line placement on 04/19/2020.  On 8/19, she underwent first dose of chemo and tolerated well. Later that night she developed respiratory distress due to volume overload from IVF and a CXR showed vascular congestion. IVF were discontinued and she was started on lasix.  She continued to have abdominal findings concerning for ileus however was not very symptomatic. Serial abdominal xrays showed ongoing colonic gaseous distension.  Due to prolonged ileus and no advancement of her diet, she was started on TPN.  Oncology closely following. She developed nausea and vomiting on 04/27/20 so had to be put on NG tube.     Assessment & Plan:   Principal Problem:   Cervical cancer ([place]) Active Problems:   Essential hypertension   UTI (urinary tract infection)   AKI (acute kidney injury) (H[facility])   Hyponatremia   Orthostatic hypotension   Hyponatremia syndrome  Abdominal pain with dysuria, AKI, right  hydronephrosis  Metastatic omental carcinomatosis.Cervical cancer-new diagnosis Bowel obstruction vs ileus -CT abdomen pelvis showed possible neoplastic processes arising from the bladder urothelium or from cervix and invading into the posterior bladder wall with obstruction of right UVJ or distal right ureter, moderate right hydronephrosis, diffuse omental nodularity and caking consistent with metastatic disease. Moderate right and small left pleural effusions, associated partial compressive atelectasis of lower lobes. Discussed with the patient, no prior history of malignancy (just had a history of ovarian cyst at the age of 85 years). -Urology consulted,s/pright ureteral stent placementon 8/14. Biopsy of the posterior bladder with intraoperative findings suggest extrinsic mass invading bladder rather than primary bladder malignancy.  -CT chest with contrast showed small pleural effusion left, moderate sized right pleural effusion, moderate amount of abdominal free fluid, moderate to marked severe right-sided hydronephrosis. No lung lesions. - frozen section consistent with adenocarcinoma; final path also shows adenocarcinoma from cervical biopsy -Received infusion of carboplatin/paclitaxel on 04/20/2020; PICC line placed 04/19/20 -She continued to have abdominal findings concerning for ileus however was not very symptomatic. Serial abdominal xrays showed ongoing colonic gaseous distension.  Due to prolonged ileus and no advancement of her diet, she was started on TPN -On 8/26,she developed nausea and vomiting today so had to be put on NG tube. Abd Xray done on 04/29/20 showed continued air-filled prominent colon may represent ileus.  Cough/shortness of breath: Patient was coughing and sounded congested this morning but maintaining her saturation on room air..  We are checking chest x-ray.  We have discontinued IV fluids.  She might need Lasix if it shows  pulmonary  edema.  Leukopenia/thrombocytopenia: Most likely associated  with malignancy, chemotherapy.  I will leave the decision for Granix up to oncology.  Continue to monitor CBC.  Heparin discontinued.  Low suspicion for HIT.  Hyponatremia, dehydration -resolved  Hyperkalemia: Potassium 5.6.  We have ordered a dose of Lokelma.  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 -Continue to monitor BMP  Hypertension -BP stable, continue metoprolol -continue metoprolol, avoid diuretics/ ACE inhibitor  CAD status post PTCA Continue metoprolol, aspirin  GERD Continue PPI  Obesity BMI of 37.  Nutrition Problem: Increased nutrient needs Etiology: cancer and cancer related treatments      DVT prophylaxis: Heparin [place] Code Status: Full code Family Communication:Discussed with son on phone on 04/27/20 Status is: Inpatient  Remains inpatient appropriate because:IV treatments appropriate due to intensity of illness or inability to take PO   Dispo: The patient is from: Home              Anticipated d/c is to: Home              Anticipated d/c date is: > 3 days              Patient currently is not medically stable to d/c.  On TPN. On NG tube. Needs oncology clearance before discharge.  Consultants: Oncology  Procedures: Ureteral stent placement, cervical biopsy  Antimicrobials:  Anti-infectives (From admission, onward)   Start     Dose/Rate Route Frequency Ordered Stop   04/15/20 1547  sodium chloride 0.9 % with cefTRIAXone (ROCEPHIN) ADS Med       Note to Pharmacy: Nunzio Cobbs : cabinet override      04/15/20 1547 04/16/20 0359   04/13/20 1700  cefTRIAXone (ROCEPHIN) 1 g in sodium chloride 0.9 % 100 mL IVPB        1 g 200 mL/hr over 30 Minutes Intravenous Every 24 hours 04/13/20 1635 04/17/20 1659      Subjective:  Patient  seen and examined the bedside this morning.  Hemodynamically stable during my evaluation.  She does not feel good today.  She was complaining of cough, shortness of breath and was unable to speak.  She sounded congested.  We are doing a chest x-ray.  She was also having some vomiting  Objective: Vitals:   04/29/20 1325 04/29/20 2044 04/30/20 0027 04/30/20 0631  BP: 123/81 135/78 123/76 132/90  Pulse: 91 94 (!) 109 97  Resp: 16 20  20   Temp: 98.4 F (36.9 C) 98.2 F (36.8 C)  (!) 97.4 F (36.3 C)  TempSrc: Oral Oral  Oral  SpO2: 96% 91%  92%  Weight:      Height:        Intake/Output Summary (Last 24 hours) at 04/30/2020 0855 Last data filed at 04/30/2020 0723 Gross per 24 hour  Intake 5461.09 ml  Output 300 ml  Net 5161.09 ml   Filed Weights   04/13/20 1234 04/24/20 0830  Weight: 98 kg 98.2 kg    Examination:   General exam: Uncomfortable due to cough HEENT:PERRL,Oral mucosa moist, Ear/Nose normal on gross exam Respiratory system: Bilateral diminished air sounds on the bases Cardiovascular system: S1 & S2 heard, RRR. No JVD, murmurs, rubs, gallops or clicks. Gastrointestinal system: Abdomen is distended, soft and nontender. No organomegaly or masses felt. Sluggish bowel sounds Central nervous system: Alert and oriented.  No focal neurological deficits. Extremities: No edema, no clubbing ,no cyanosis Skin: No rashes, lesions or ulcers,no icterus ,no pallor   Data Reviewed: I have personally reviewed following labs and imaging studies  CBC: Recent Labs  Lab 04/25/20 0500 04/26/20 0545 04/27/20 0603 04/28/20 0604 04/29/20 0829  WBC 2.7* 1.4* 1.2* 0.8* 1.3*  NEUTROABS 2.2 0.9* 0.7* 0.2* 0.3*  HGB 11.4* 10.9* 11.4* 9.9* 10.0*  HCT 34.4* 33.2* 37.3 31.1* 31.9*  MCV 84.7 85.8 89.2 86.4 86.7  PLT 130* 98* 89* 83* 87*   Basic Metabolic Panel: Recent Labs  Lab 04/25/20 0500 04/26/20 0545 04/27/20 0603 04/28/20 0604 04/29/20 0803  NA 135 137 134* 136 137  K 4.3  4.6 5.2* 4.5 4.3  CL 101 102 102 106 105  CO2 23 24 22 22 22   GLUCOSE 133* 120* 133* 135* 142*  BUN 35* 33* 38* 41* 47*  CREATININE 1.15* 1.06* 1.05* 1.05* 1.07*  CALCIUM 8.3* 8.5* 8.6* 8.3* 8.4*  MG 2.0 2.1 2.0 2.0 2.0  PHOS 2.8 3.0 3.3 3.8 3.6   GFR: Estimated Creatinine Clearance: 58.9 mL/min (A) (by C-G formula based on SCr of 1.07 mg/dL (H)). Liver Function Tests: Recent Labs  Lab 04/24/20 0618 04/25/20 0500 04/26/20 0545 04/27/20 0603 04/28/20 0604  AST 24 23 21 24 23   ALT 16 15 13 12 13   ALKPHOS 40 37* 35* 42 47  BILITOT 1.0 0.7 0.4 0.5 0.5  PROT 5.4* 5.4* 5.3* 5.3* 5.3*  ALBUMIN 2.6* 2.6* 2.4* 2.5* 2.3*   No results for input(s): LIPASE, AMYLASE in the last 168 hours. No results for input(s): AMMONIA in the last 168 hours. Coagulation Profile: No results for input(s): INR, PROTIME in the last 168 hours. Cardiac Enzymes: No results for input(s): CKTOTAL, CKMB, CKMBINDEX, TROPONINI in the last 168 hours. BNP (last 3 results) No results for input(s): PROBNP in the last 8760 hours. HbA1C: No results for input(s): HGBA1C in the last 72 hours. CBG: Recent Labs  Lab 04/29/20 0752 04/29/20 1618 04/30/20 0028 04/30/20 0631 04/30/20 0801  GLUCAP 131* 132* 135* 145* 128*   Lipid Profile: No results for input(s): CHOL, HDL, LDLCALC, TRIG, CHOLHDL, LDLDIRECT in the last 72 hours. Thyroid Function Tests: No results for input(s): TSH, T4TOTAL, FREET4, T3FREE, THYROIDAB in the last 72 hours. Anemia Panel: No results for input(s): VITAMINB12, FOLATE, FERRITIN, TIBC, IRON, RETICCTPCT in the last 72 hours. Sepsis Labs: No results for input(s): PROCALCITON, LATICACIDVEN in the last 168 hours.  No results found for this or any previous visit (from the past 240 hour(s)).       Radiology Studies: DG Abd 1 View  Result Date: 04/29/2020 CLINICAL DATA:  Follow-up ileus EXAM: ABDOMEN - 1 VIEW COMPARISON:  April 27, 2020 FINDINGS: A right ureteral stent is stable. In NG  tube is identified. I suspect the distal tip may be in the proximal duodenum. Air-filled prominent colon remains. No small bowel dilatation. No other acute abnormalities. IMPRESSION: 1. Support apparatus as above. 2. Continued air-filled prominent colon may represent ileus. No other abnormalities. Electronically Signed   By: Dorise Bullion III M.D   On: 04/29/2020 09:40   DG Chest Port 1 View  Result Date: 04/28/2020 CLINICAL DATA:  64 year old female with persistent cough. Query fluid overload. EXAM: PORTABLE CHEST 1 VIEW COMPARISON:  Portable chest 04/20/2020 and earlier. FINDINGS: Portable AP semi upright view at 1214 hours. Stable right PICC line. Enteric tube has been placed and courses to the stomach, tip not included. Improved bilateral ventilation,  but continued basilar predominant increased interstitial opacity, and dense retrocardiac opacity. Veiling opacity on the right is compatible with a superimposed pleural effusion. No pneumothorax. Upper lung pulmonary vascularity appears more normal now. Mediastinal contours are stable and within normal limits. Dense retrocardiac opacity without air bronchograms. Paucity of bowel gas. No acute osseous abnormality identified. IMPRESSION: 1. Suspect regression of pulmonary edema since 04/20/2020, but with residual vascular congestion, small right pleural effusion, and superimposed left lower lobe collapse or consolidation. 2. Enteric tube placed into the stomach, tip not included. Electronically Signed   By: Genevie Ann M.D.   On: 04/28/2020 12:35        Scheduled Meds: . Chlorhexidine Gluconate Cloth  6 each Topical Daily  . feeding supplement  1 Container Oral TID BM  . insulin aspart  0-9 Units Subcutaneous Q8H  . metoprolol tartrate  2.5 mg Intravenous Q6H  . pantoprazole (PROTONIX) IV  40 mg Intravenous Q24H  . sodium chloride flush  10-40 mL Intracatheter Q12H   Continuous Infusions: . sodium chloride 75 mL/hr at 04/29/20 1848  . TPN ADULT  (ION) 75 mL/hr at 04/29/20 1848  . TPN ADULT (ION)       LOS: 16 days    Time spent: 35 mins,More than 50% of that time was spent in counseling and/or coordination of care.      Shelly Coss, MD [facility] P8/29/2021, 8:55 AM

## 2020-05-01 ENCOUNTER — Inpatient Hospital Stay (HOSPITAL_COMMUNITY): Payer: Medicare Other

## 2020-05-01 DIAGNOSIS — C762 Malignant neoplasm of abdomen: Secondary | ICD-10-CM

## 2020-05-01 DIAGNOSIS — K920 Hematemesis: Secondary | ICD-10-CM

## 2020-05-01 LAB — COMPREHENSIVE METABOLIC PANEL
ALT: 25 U/L (ref 0–44)
AST: 39 U/L (ref 15–41)
Albumin: 2 g/dL — ABNORMAL LOW (ref 3.5–5.0)
Alkaline Phosphatase: 70 U/L (ref 38–126)
Anion gap: 8 (ref 5–15)
BUN: 77 mg/dL — ABNORMAL HIGH (ref 8–23)
CO2: 20 mmol/L — ABNORMAL LOW (ref 22–32)
Calcium: 8.2 mg/dL — ABNORMAL LOW (ref 8.9–10.3)
Chloride: 109 mmol/L (ref 98–111)
Creatinine, Ser: 1.62 mg/dL — ABNORMAL HIGH (ref 0.44–1.00)
GFR calc Af Amer: 38 mL/min — ABNORMAL LOW (ref >60)
GFR calc non Af Amer: 33 mL/min — ABNORMAL LOW (ref >60)
Glucose, Bld: 134 mg/dL — ABNORMAL HIGH (ref 70–99)
Potassium: 4.1 mmol/L (ref 3.5–5.1)
Sodium: 137 mmol/L (ref 135–145)
Total Bilirubin: 0.4 mg/dL (ref 0.3–1.2)
Total Protein: 5 g/dL — ABNORMAL LOW (ref 6.5–8.1)

## 2020-05-01 LAB — CBC
HCT: 24.2 % — ABNORMAL LOW (ref 36.0–46.0)
Hemoglobin: 7.7 g/dL — ABNORMAL LOW (ref 12.0–15.0)
MCH: 27.7 pg (ref 26.0–34.0)
MCHC: 31.8 g/dL (ref 30.0–36.0)
MCV: 87.1 fL (ref 80.0–100.0)
Platelets: 77 10*3/uL — ABNORMAL LOW (ref 150–400)
RBC: 2.78 MIL/uL — ABNORMAL LOW (ref 3.87–5.11)
RDW: 14.6 % (ref 11.5–15.5)
WBC: 5 10*3/uL (ref 4.0–10.5)
nRBC: 0.6 % — ABNORMAL HIGH (ref 0.0–0.2)

## 2020-05-01 LAB — MAGNESIUM: Magnesium: 2.1 mg/dL (ref 1.7–2.4)

## 2020-05-01 LAB — DIFFERENTIAL
Abs Immature Granulocytes: 0.25 10*3/uL — ABNORMAL HIGH (ref 0.00–0.07)
Basophils Absolute: 0 10*3/uL (ref 0.0–0.1)
Basophils Relative: 0 %
Eosinophils Absolute: 0.1 10*3/uL (ref 0.0–0.5)
Eosinophils Relative: 3 %
Immature Granulocytes: 5 %
Lymphocytes Relative: 14 %
Lymphs Abs: 0.7 10*3/uL (ref 0.7–4.0)
Monocytes Absolute: 0.9 10*3/uL (ref 0.1–1.0)
Monocytes Relative: 18 %
Neutro Abs: 3 10*3/uL (ref 1.7–7.7)
Neutrophils Relative %: 60 %

## 2020-05-01 LAB — TRIGLYCERIDES: Triglycerides: 141 mg/dL (ref <150)

## 2020-05-01 LAB — GLUCOSE, CAPILLARY
Glucose-Capillary: 150 mg/dL — ABNORMAL HIGH (ref 70–99)
Glucose-Capillary: 130 mg/dL — ABNORMAL HIGH (ref 70–99)

## 2020-05-01 LAB — PHOSPHORUS: Phosphorus: 4.5 mg/dL (ref 2.5–4.6)

## 2020-05-01 LAB — PREALBUMIN: Prealbumin: 8.8 mg/dL — ABNORMAL LOW (ref 18–38)

## 2020-05-01 MED ORDER — BISACODYL 10 MG RE SUPP
10.0000 mg | Freq: Two times a day (BID) | RECTAL | Status: AC
  Filled 2020-05-01 (×2): qty 1

## 2020-05-01 MED ORDER — TRAVASOL 10 % IV SOLN
INTRAVENOUS | Status: AC
  Filled 2020-05-01: qty 900

## 2020-05-01 NOTE — Consult Note (Addendum)
Referring Provider: Dr. Shelly Coss Primary Care Physician:  Patient, No Pcp Per Primary Gastroenterologist:  Althia Forts   Reason for Consultation: Ileus  HPI: Eileen Burgess is a 64 y.o. female with a past medical history of hypertension, coronary artery disease MI status post DES to the LAD in 2005 and GERD.  She was admitted to [facility] 04/13/2020 to having generalized weakness and diarrhea for 3 weeks.  An abdominal/pelvic CAT scan identified a pelvic mass concerning for malignancy with right hydroureter nephrosis due to an extrinsic obstruction of evidence of a probable metastatic disease to the omentum. A chest CT with contrast 04/15/2020 showed a small left pleural effusion with moderate size right pleural effusion and moderate amount of abdominal free fluid.  She underwent right ureteral stent placement on 04/15/2020 with biopsy of the posterior bladder with intraoperative findings suggestive of extrinsic mass invading the bladder rather than primary bladder malignancy.  A CA-125 was elevated at 208.  A CEA and CA 19.9 were normal.  She underwent a biopsy of her cervix on 04/17/2020.  Pathology reports were consistent stage IV cervical cancer (adenocarcinoma). She was started on chemotherapy  (Carboplatin and Paclitaxel ) on 04/20/2020.  A PICC line was placed and she is receiving TPN..  A GI consult was requested due to the patient having an persistent ileus.   Currently, she is fatigued.  She denies having any nausea, vomiting or abdominal pain.  However, her RN stated she vomited brown liquid x 1 around 1: 30pm. She received Zofran.  Her NG tube is draining dark brown watery drainage.  No gas per the rectum.  Reported having diarrhea for 3 weeks prior to her hospital admission.  No recent antibiotics. Prior to her hospital admission,  she had a fairly normal bowel pattern.  No history of rectal bleeding or melena.  She has never had a screening colonoscopy.  No GERD symptoms.  No  family history of esophageal, gastric or colon cancer.  She has a congested cough with intermittent shortness of breath which started during her hospitalization.  She just received a nebulizer treatment.  No chest pain or palpitations.  No family at the bedside.   Abdominal x-ray 05/01/2020: 1. Enteric tube terminates in the descending duodenum. 2. Mildly dilated small bowel loops in right abdomen appear slightly increased. Stable moderate gaseous distention of the colon. Findings are compatible with persistent adynamic ileus.  Abdominal x-ray 04/27/2020: Mild gaseous distention of the proximal half the colon with absent gas distally, could reflect ileus but unable to exclude distal colonic obstruction with this appearance.   Abdominal x-ray 04/20/2020: 1.  Double-J right ureteral stent in good anatomic position. 2. Colon is slightly distended. Cecum measures 8.9 cm. Although these findings most likely represent colonic ileus, follow-up exam suggested to exclude colonic obstruction. No small bowel distention. No free air. 3.  Bibasilar atelectasis/infiltrates.    Renal stone CT without contrast 04/14/2020: 1. Findings concerning for a neoplastic process either arising from the bladder urothelium or from the cervix and invading into the posterior bladder wall with obstruction of the right UVJ or distal right ureter. There is associated moderate right hydronephrosis. Sampling of the ascitic fluid may provide further diagnostic information. 2. Diffuse omental nodularity and caking consistent with metastatic disease. 3. Moderate right and small left pleural effusions with associated partial compressive atelectasis of the lower lobes. Pneumonia is not excluded. Clinical correlation is recommended. 4. Sigmoid diverticulosis. No bowel obstruction. 5. Aortic Atherosclerosis   Past  Medical History:  Diagnosis Date  . CAD S/P percutaneous coronary angioplasty 2005   PCI to pLAD 99%;  3.0 mm x 38 mm Cypher DES; EF ~45% - distal, septal apical hypokinesis    . Dyslipidemia, goal LDL below 70   . Essential hypertension   . GERD (gastroesophageal reflux disease)   . 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 Insomnia     Past Surgical History:  Procedure Laterality Date  . APPENDECTOMY  12/1968   at same time as her ovarian cyst surgery  . CESAREAN SECTION  05/18/1983  . CORONARY ANGIOPLASTY WITH STENT PLACEMENT  2005   prox LAD 3.0 mm x 28 mm Cypher DES; circumflex is nondominant with 2 large EOMs. Dominant RCA is a large PDA and posterolateral branch.  . CYSTOSCOPY W/ URETERAL STENT PLACEMENT Right 04/15/2020   Procedure: CYSTOSCOPY WITH RETROGRADE PYELOGRAM/URETERAL STENT PLACEMENT;  Surgeon: Raynelle Bring, MD;  Location: [facility];  Service: Urology;  Laterality: Right;  . NM MYOVIEW LTD  10/2012   8:50 min; 9 METS.  No ischemia or Infarction; EF ~68%  . OVARIAN CYST REMOVAL  12/1968  . TRANSURETHRAL RESECTION OF BLADDER TUMOR N/A 04/15/2020   Procedure: bladder biopsy with fulguration of bleeders;  Surgeon: Raynelle Bring, MD;  Location: [facility];  Service: Urology;  Laterality: N/A;    Prior to Admission medications   Medication Sig Start Date End Date Taking? Authorizing Provider  acetaminophen (TYLENOL) 500 MG tablet Take 500-1,000 mg by mouth every 6 (six) hours as needed for mild pain or moderate pain.   Yes [provider]  hydrochlorothiazide (MICROZIDE) 12.5 MG capsule TAKE 1 CAPSULE BY MOUTH DAILY Patient taking differently: Take 12.5 mg by mouth daily.  03/07/20  Yes [name], MD  Ibuprofen-Acetaminophen (ADVIL DUAL ACTION) 125-250 MG TABS Take 2 tablets by mouth every 8 (eight) hours as needed (pain).   Yes [provider]  lisinopril (ZESTRIL) 20 MG tablet TAKE 1 TABLET BY MOUTH DAILY Patient taking differently: Take 20 mg by mouth daily.  03/07/20  Yes [name], MD  metoprolol succinate (TOPROL-XL) 25 MG 24 hr tablet Take 1  tablet (25 mg total) by mouth daily. 09/08/19  Yes [name], MD  nitroGLYCERIN (NITROSTAT) 0.4 MG SL tablet TAKE 1 TABLET UNDER THE TONGUE AS NEEDEDFOR CHEST PAIN UP TO 3 TIMES AN EPISODE Patient taking differently: Place 0.4 mg under the tongue every 5 (five) minutes x 3 doses as needed for chest pain.  06/16/18  Yes [name], MD  Alirocumab (PRALUENT) 150 MG/ML SOAJ Inject 150 mg into the skin every 14 (fourteen) days. Patient not taking: Reported on 04/13/2020 06/02/19   [name], MD    Current Facility-Administered Medications  Medication Dose Route Frequency Provider Last Rate Last Admin  . acetaminophen (TYLENOL) tablet 650 mg  650 mg Oral Q6H PRN Raynelle Bring, MD       Or  . acetaminophen (TYLENOL) suppository 650 mg  650 mg Rectal Q6H PRN Raynelle Bring, MD      . alteplase (CATHFLO ACTIVASE) injection 2 mg  2 mg Intracatheter Once PRN [name], MD      . alum & mag hydroxide-simeth (MAALOX/MYLANTA) 200-200-20 MG/5ML suspension 30 mL  30 mL Oral Q4H PRN Dwyane Dee, MD   30 mL at 04/27/20 0141  . Chlorhexidine Gluconate Cloth 2 % PADS 6 each  6 each Topical Daily Dwyane Dee, MD   6 each at 05/01/20 1344  .  feeding supplement (BOOST / RESOURCE BREEZE) liquid 1 Container  1 Container Oral TID BM Dwyane Dee, MD   1 Container at 04/24/20 1359  . guaiFENesin-dextromethorphan (ROBITUSSIN DM) 100-10 MG/5ML syrup 10 mL  10 mL Oral Q6H PRN Adhikari, Amrit, MD      . heparin lock flush 100 unit/mL  500 Units Intracatheter Once PRN Alvy Bimler, Ni, MD      . heparin lock flush 100 unit/mL  250 Units Intracatheter Once PRN Alvy Bimler, Ni, MD      . hydrALAZINE (APRESOLINE) tablet 25 mg  25 mg Oral Q6H PRN Raynelle Bring, MD   25 mg at 04/20/20 1529  . insulin aspart (novoLOG) injection 0-9 Units  0-9 Units Subcutaneous Q8H Lenis Noon, [facility]   1 Units at 05/01/20 4268  . ipratropium-albuterol (DUONEB) 0.5-2.5 (3) MG/3ML nebulizer solution 3 mL  3 mL Nebulization Q4H PRN  Lang Snow, FNP   3 mL at 04/20/20 2011  . ipratropium-albuterol (DUONEB) 0.5-2.5 (3) MG/3ML nebulizer solution 3 mL  3 mL Nebulization Q6H Adhikari, Amrit, MD   3 mL at 05/01/20 0737  . metoprolol tartrate (LOPRESSOR) injection 2.5 mg  2.5 mg Intravenous Q6H Adhikari, Amrit, MD   2.5 mg at 05/01/20 1137  . nitroGLYCERIN (NITROSTAT) SL tablet 0.4 mg  0.4 mg Sublingual Q5 Min x 3 PRN Raynelle Bring, MD      . ondansetron [facility]) tablet 4 mg  4 mg Oral Q6H PRN Raynelle Bring, MD       Or  . ondansetron [facility]) injection 4 mg  4 mg Intravenous Q6H PRN Raynelle Bring, MD   4 mg at 05/01/20 1343  . pantoprazole (PROTONIX) injection 40 mg  40 mg Intravenous Q24H Shelly Coss, MD   40 mg at 05/01/20 1137  . phenol (CHLORASEPTIC) mouth spray 1 spray  1 spray Mouth/Throat PRN Rai, Ripudeep K, MD      . prochlorperazine (COMPAZINE) injection 10 mg  10 mg Intravenous Q6H PRN Rai, Ripudeep K, MD   10 mg at 04/20/20 1910  . simethicone (MYLICON) chewable tablet 80 mg  80 mg Oral QID PRN Rai, Ripudeep K, MD      . sodium chloride flush (NS) 0.9 % injection 10 mL  10 mL Intracatheter PRN Gorsuch, Ni, MD      . sodium chloride flush (NS) 0.9 % injection 10-40 mL  10-40 mL Intracatheter Q12H Dwyane Dee, MD   10 mL at 05/01/20 3419  . sodium chloride flush (NS) 0.9 % injection 10-40 mL  10-40 mL Intracatheter PRN Dwyane Dee, MD   10 mL at 04/24/20 1737  . sodium chloride flush (NS) 0.9 % injection 3 mL  3 mL Intracatheter PRN Alvy Bimler, Ni, MD      . TPN ADULT (ION)   Intravenous Continuous TPN Randa Spike, RPH 75 mL/hr at 05/01/20 1100 Rate Verify at 05/01/20 1100  . TPN ADULT (ION)   Intravenous Continuous TPN Luiz Ochoa, [facility]        Allergies as of 04/13/2020 - Review Complete 04/13/2020  Allergen Reaction Noted  . Shrimp [shellfish allergy] Anaphylaxis 03/23/2013  . Crestor [rosuvastatin] Other (See Comments) 02/15/2014  . Pravastatin Other (See Comments) 03/23/2013  . Vytorin  [ezetimibe-simvastatin] Other (See Comments) 03/23/2013  . Wasp venom Hives 04/13/2020    Family History  Problem Relation Age of Onset  . Stroke Mother   . Hypertension Mother   . Hyperlipidemia Father   . Cancer Maternal Grandmother  unsure type, thinks may be gyn  . Hyperlipidemia Son        not known, pt assumes because of poor diet  . Breast cancer Neg Hx   . Colon cancer Neg Hx     Social History   Socioeconomic History  . Marital status: Widowed    Spouse name: Not on file  . Number of children: Not on file  . Years of education: Not on file  . Highest education level: Not on file  Occupational History  . Occupation: retired  Tobacco Use  . Smoking status: Former Smoker    Quit date: 07/03/2004    Years since quitting: 15.8  . Smokeless tobacco: Never Used  Substance and Sexual Activity  . Alcohol use: No  . Drug use: No  . Sexual activity: Not Currently  Other Topics Concern  . Not on file  Social History Narrative   Widowed.   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   Has been quite active, but no routine exercise.   Quit Smoking in 2005 before her MI.  Does not drink alcohol.   Social Determinants of Health   Financial Resource Strain:   . Difficulty of Paying Living Expenses: Not on file  Food Insecurity:   . Worried About Charity fundraiser in the Last Year: Not on file  . Ran Out of Food in the Last Year: Not on file  Transportation Needs:   . Lack of Transportation (Medical): Not on file  . Lack of Transportation (Non-Medical): Not on file  Physical Activity:   . Days of Exercise per Week: Not on file  . Minutes of Exercise per Session: Not on file  Stress:   . Feeling of Stress : Not on file  Social Connections:   . Frequency of Communication with Friends and Family: Not on file  . Frequency of Social Gatherings with Friends and Family: Not on file  . Attends Religious  Services: Not on file  . Active Member of Clubs or Organizations: Not on file  . Attends Archivist Meetings: Not on file  . Marital Status: Not on file  Intimate Partner Violence:   . Fear of Current or Ex-Partner: Not on file  . Emotionally Abused: Not on file  . Physically Abused: Not on file  . Sexually Abused: Not on file    Review of Systems: Gen: Denies fever, sweats or chills. No weight loss.  CV: Denies chest pain, palpitations or edema. Resp: See HPI.  GI: See HPI.    GU : Denies urinary burning, blood in urine, increased urinary frequency or incontinence. MS: Denies joint pain, muscles aches or weakness. Derm: Denies rash, itchiness, skin lesions or unhealing ulcers. Psych: Denies depression or anxiety.  Heme: Denies easy bruising, bleeding. Neuro:  Denies headaches, dizziness or paresthesias. Endo:  Denies any problems with DM, thyroid or adrenal function.  Physical Exam: Vital signs in last 24 hours: Temp:  [97.6 F (36.4 C)-99.2 F (37.3 C)] 98.8 F (37.1 C) (08/30 1305) Pulse Rate:  [76-95] 89 (08/30 1305) Resp:  [18-28] 22 (08/30 1305) BP: (94-111)/(38-84) 94/38 (08/30 1305) SpO2:  [92 %-97 %] 96 % (08/30 1305) Weight:  [105 kg] 105 kg (08/30 0838) Last BM Date: 04/15/20 (per the charting)   General: Fatigued appearing 64 year old female. Softly spoken. Alert.  Head:  Normocephalic and atraumatic. Eyes:  No scleral icterus. Conjunctiva pink. Ears:  Normal auditory acuity. Nose:  No deformity, discharge or lesions. Mouth: Poor dentition with dental carries present.  No ulcers or lesions.  Neck:  Supple. No lymphadenopathy or thyromegaly.  Lungs: Few scattered expiratory wheezes.   Heart:  RRR, no murmur.  Abdomen:  Distended, firm without palpable mass. NGT to LIS draining dark brown fluid.  Rectal: Deferred. Musculoskeletal:  Symmetrical without gross deformities.  Pulses:  Normal pulses noted. Extremities:  Without clubbing or  edema. Neurologic:  Alert and  oriented x4. All 4 extremities are weak.  Skin:  Intact without significant lesions or rashes. Psych:  Alert and cooperative. Normal mood and affect.  Intake/Output from previous day: 08/29 0701 - 08/30 0700 In: 1566.9 [I.V.:1566.9] Out: 1850 [Urine:1050; Emesis/NG output:800] Intake/Output this shift: Total I/O In: 669.4 [I.V.:669.4] Out: 700 [Emesis/NG output:700]  Lab Results: Recent Labs    04/29/20 0829 05/01/20 0408  WBC 1.3* 5.0  HGB 10.0* 7.7*  HCT 31.9* 24.2*  PLT 87* 77*   BMET Recent Labs    04/29/20 0803 04/30/20 0952 05/01/20 0408  NA 137 132* 137  K 4.3 5.6* 4.1  CL 105 102 109  CO2 22 18* 20*  GLUCOSE 142* 952* 134*  BUN 47* 53* 77*  CREATININE 1.07* 1.38* 1.62*  CALCIUM 8.4* 8.5* 8.2*   LFT Recent Labs    05/01/20 0408  PROT 5.0*  ALBUMIN 2.0*  AST 39  ALT 25  ALKPHOS 70  BILITOT 0.4    Studies/Results: DG Chest 1 View  Result Date: 04/30/2020 CLINICAL DATA:  Shortness of breath and cough. EXAM: CHEST  1 VIEW COMPARISON:  Chest radiograph dated 04/28/2020. FINDINGS: The heart size and mediastinal contours are within normal limits. Opacity of the right hemithorax likely reflects a layering pleural effusion with associated atelectasis. A small left pleural effusion with associated atelectasis is noted. Mild-to-moderate bibasilar airspace opacities likely contribute. Mild diffuse bilateral interstitial opacities are noted. There is no pneumothorax. A right upper extremity peripherally inserted central venous catheter tip overlies the superior vena cava. An enteric tube enters the stomach and terminates below the field of view. IMPRESSION: Bilateral pleural effusions with associated atelectasis and/or airspace opacities. Mild diffuse bilateral interstitial opacities may represent pulmonary edema or infection. Electronically Signed   By: Zerita Boers M.D.   On: 04/30/2020 15:04   DG Abd 1 View  Result Date:  05/01/2020 CLINICAL DATA:  Follow-up ileus EXAM: ABDOMEN - 1 VIEW COMPARISON:  04/29/2020 abdominal radiograph FINDINGS: Enteric tube terminates in the descending duodenum. Stable right nephroureteral stent. Mildly dilated small bowel loops in right abdomen appears slightly increased. Moderate gaseous distention of the colon appears similar. No evidence of pneumatosis or pneumoperitoneum. No radiopaque nephrolithiasis. IMPRESSION: 1. Enteric tube terminates in the descending duodenum. 2. Mildly dilated small bowel loops in right abdomen appear slightly increased. Stable moderate gaseous distention of the colon. Findings are compatible with persistent adynamic ileus. Electronically Signed   By: Ilona Sorrel M.D.   On: 05/01/2020 10:05    IMPRESSION/PLAN:  21.  64 year old female with stage IV cervical adenocarcinoma with carcinomatosis, N/V and a colonic ileus.  NG tube to LIS placed. 1000cc of brown liquid drained from NGT today. Xray shows persistent colonic ileus. Repeat CTAP without contrast ordered.  - Turn patient Q 2 hours - Continue NGT to LIS - Dulcolax 10mg  suppository bid x 3 days - Ondansetron 4mg  IV Q 6 hrs PRN - Continue TPN - Oncology following   2.  AKI secondary to hydronephrosis secondary to  malignant pelvic mass. Status post ureteral stent placement. Cr. 1.65.  - Urology following   3.  History of coronary artery disease s/p DES 2005  4.  Anemia, most likely due to chemotherapy + UGI bleeding.  Hg 10.0 -> Hg 7.7. MCV 87.1.  - Guaiac NGT drainage: ADDENDUM: NGT drainage grossly heme +.  - Continue Pantoprazole 40mg  IV Q 24 hrs - History of CAD, transfuse for Hg < 7. Defer decision to transfuse to the hospitalist/oncology  - No plans for endoscopic evaluation at this time - Monitor H/H closely  5. Thrombocytopenia, most likely due to chemotherapy.  PLT 77.   Further recommendations per Dr. Nehemiah Settle Dorathy Daft  05/01/2020, 2:03 PM      Attending Physician  Note   I have taken a history, examined the patient and reviewed the chart. I agree with the Advanced Practitioner's note, impression and recommendations.  * Stage IV cervical adenocarcinoma with carcinomatosis receiving chemotherapy with a persistent ileus which will likely be difficult to manage with carcinomatosis.  * Dark, heme positive NG drainage. Gastritis, esophagitis, NG trauma all likely etiologies  * Anemia likely from chemotherapy and some GI losses  Continue NGT to LIS Zofran IV Pantoprazole IV Ducolax supp bid x 3 days Follow [facility] Trend CBC Reserve EGD for persistent or more active UGI bleeding   Lucio Edward, MD [facility]

## 2020-05-01 NOTE — Progress Notes (Signed)
PROGRESS NOTE    Eileen Burgess  CLE:[id number] DOB: 03/16/1956 DOA: 04/13/2020 PCP: Patient, No Pcp Per   Brief Narrative:  Ms. Scrivens is a 64 y.o.female with PMH CAD s/p angioplasty to the LAD, hypertension, history of tobacco abuse she is currently non-smoking, GERD that came to the hospital due to 3 weeks of episodic diarrhea that had progressively gotten worse, she has not received any antibiotics over the last year, she is also been nauseated with loss of appetite and decreased oral intake  She underwent a CT renal stone protocol which revealed an underlying malignancy involving the cervix and invading into the posterior bladder wall with obstruction of the right UVJ or distal right ureter with right hydronephrosis.  Urology was consulted and she has since undergone a ureteral stent on the right side due to ureteral obstruction.   She was also evaluated by oncology and underwent cervical biopsy.  Frozen section and formal pathology are both consistent with adenocarcinoma.  She underwent PICC line placement on 04/19/2020.  On 8/19, she underwent first dose of chemo and tolerated well. Later that night she developed respiratory distress due to volume overload from IVF and a CXR showed vascular congestion. IVF were discontinued and she was started on lasix.  She continued to have abdominal findings concerning for ileus however was not very symptomatic. Serial abdominal xrays showed ongoing colonic gaseous distension.  Due to prolonged ileus and no advancement of her diet, she was started on TPN.  Oncology closely following. She developed nausea and vomiting on 04/27/20 so had to be put on NG tube.  Hospital course remarkable for persistent ileus requiring NG tube placement.  GI also consulted today.    Assessment & Plan:   Principal Problem:   Cervical cancer ([place]) Active Problems:   Essential hypertension   UTI (urinary tract infection)   AKI (acute kidney injury) (H[facility])    Hyponatremia   Orthostatic hypotension   Hyponatremia syndrome  Abdominal pain with dysuria, AKI, right hydronephrosis  Metastatic omental carcinomatosis.Cervical cancer-new diagnosis Bowel obstruction vs ileus -CT abdomen pelvis showed possible neoplastic processes arising from the bladder urothelium or from cervix and invading into the posterior bladder wall with obstruction of right UVJ or distal right ureter, moderate right hydronephrosis, diffuse omental nodularity and caking consistent with metastatic disease. Moderate right and small left pleural effusions, associated partial compressive atelectasis of lower lobes. Discussed with the patient, no prior history of malignancy (just had a history of ovarian cyst at the age of 58 years). -Urology consulted,s/pright ureteral stent placementon 8/14. Biopsy of the posterior bladder with intraoperative findings suggest extrinsic mass invading bladder rather than primary bladder malignancy.  -CT chest with contrast showed small pleural effusion left, moderate sized right pleural effusion, moderate amount of abdominal free fluid, moderate to marked severe right-sided hydronephrosis. No lung lesions. - frozen section consistent with adenocarcinoma; final path also shows adenocarcinoma from cervical biopsy -Received infusion of carboplatin/paclitaxel on 04/20/2020; PICC line placed 04/19/20 -She continued to have abdominal findings concerning for ileus however was not very symptomatic. Serial abdominal xrays showed ongoing colonic gaseous distension.  Due to prolonged ileus and no advancement of her diet, she was started on TPN -On 8/26,she developed nausea and vomiting today so had to be put on NG tube. Abd Xray done on 04/29/20 showed continued air-filled prominent colon may represent ileus. -Abdominal x-ray done on 8/30 showed persistent adynamic ileus.GI consulted today  Cough/shortness of breath: Patient was coughing and sounded congested on  the  morning of 04/30/20 but maintaining her saturation on room air.  Chest x-ray showed bilateral pleural effusion, atelectasis, interstitial edema.  We have discontinued IV fluids.  She was given a dose of IV Lasix with some improvement.  Monitor respiratory status.  Will not add any extra fluid.  Leukopenia/thrombocytopenia: Most likely associated  with malignancy, chemotherapy.  Continue to monitor CBC.  Heparin discontinued.  Low suspicion for HIT.  Hyperkalemia: Resolved  AKI on CKDIIIa - baseline creat appears to be 1.1, but GFR in 2018 mid 50s -Continue to hold lisinopril, HCTZ -Status post ureteral stent -Creatinine trended up with 1.6 today. -We will not add any extra IV  fluid, she is getting TPN.  Await spontaneous improvement  Hypertension -BP stable, continue metoprolol -continue metoprolol, avoid diuretics/ ACE inhibitor  CAD status post PTCA On metoprolol, aspirin  GERD Continue PPI  Obesity BMI of 37.  Goals of care: Prolonged hospital course due to persistent ileus.  She has extensive intra-abdominal/pelvic malignancy.  If she does not improve, we have to think about comfort care/hospice.  I will discuss with oncology.  Nutrition Problem: Increased nutrient needs Etiology: cancer and cancer related treatments      DVT prophylaxis: Heparin [place] Code Status: Full code Family Communication:Discussed with son on phone on 04/27/20 Status is: Inpatient  Remains inpatient appropriate because:IV treatments appropriate due to intensity of illness or inability to take PO   Dispo: The patient is from: Home              Anticipated d/c is to: Home              Anticipated d/c date is: > 3 days              Patient currently is not medically stable to d/c.  On TPN. On NG tube. Needs oncology clearance before discharge.   Consultants: Oncology  Procedures: Ureteral stent placement, cervical biopsy  Antimicrobials:  Anti-infectives (From admission,  onward)   Start     Dose/Rate Route Frequency Ordered Stop   04/15/20 1547  sodium chloride 0.9 % with cefTRIAXone (ROCEPHIN) ADS Med       Note to Pharmacy: Nunzio Cobbs : cabinet override      04/15/20 1547 04/16/20 0359   04/13/20 1700  cefTRIAXone (ROCEPHIN) 1 g in sodium chloride 0.9 % 100 mL IVPB        1 g 200 mL/hr over 30 Minutes Intravenous Every 24 hours 04/13/20 1635 04/17/20 1659      Subjective:  Patient seen and examined at the bedside this morning.  Hemodynamically stable.  She wants NG tube out but her NG tube is draining significant amount of dark bilious fluid.  Her abdomen is still distended and minimal bowel sounds heard.  Discussed with the patient about the need of continued NG tube drainage.  She denies any abdominal pain.  Denies any shortness of breath but he still sounds congested.  Objective: Vitals:   04/30/20 2329 05/01/20 0244 05/01/20 0455 05/01/20 0738  BP: 106/67  111/61   Pulse: 95  76   Resp: 20  18   Temp: 99.2 F (37.3 C)  97.6 F (36.4 C)   TempSrc: Oral  Oral   SpO2: 92% 92% 93% 96%  Weight:      Height:        Intake/Output Summary (Last 24 hours) at 05/01/2020 0817 Last data filed at 05/01/2020 0700 Gross per 24 hour  Intake 1566.89 ml  Output 1550 ml  Net 16.89 ml   Filed Weights   04/13/20 1234 04/24/20 0830  Weight: 98 kg 98.2 kg    Examination:    General exam: Generalized weakness, lying in the bed, pleasant female HEENT: NG tube Respiratory system: Bilateral diminished air sounds on the bases  cardiovascular system: S1 & S2 heard, RRR. No JVD, murmurs, rubs, gallops or clicks. Gastrointestinal system: Abdomen is distended, tense and nontender. No bowel sounds heard. Central nervous system: Alert and oriented. No focal neurological deficits. Extremities: No edema, no clubbing ,no cyanosis Skin: No rashes, lesions or ulcers,no icterus ,no pallor    Data Reviewed: I have personally reviewed following labs and  imaging studies  CBC: Recent Labs  Lab 04/26/20 0545 04/27/20 0603 04/28/20 0604 04/29/20 0829 05/01/20 0408  WBC 1.4* 1.2* 0.8* 1.3* 5.0  NEUTROABS 0.9* 0.7* 0.2* 0.3* 3.0  HGB 10.9* 11.4* 9.9* 10.0* 7.7*  HCT 33.2* 37.3 31.1* 31.9* 24.2*  MCV 85.8 89.2 86.4 86.7 87.1  PLT 98* 89* 83* 87* 77*   Basic Metabolic Panel: Recent Labs  Lab 04/27/20 0603 04/28/20 0604 04/29/20 0803 04/30/20 0952 05/01/20 0408  NA 134* 136 137 132* 137  K 5.2* 4.5 4.3 5.6* 4.1  CL 102 106 105 102 109  CO2 22 22 22  18* 20*  GLUCOSE 133* 135* 142* 952* 134*  BUN 38* 41* 47* 53* 77*  CREATININE 1.05* 1.05* 1.07* 1.38* 1.62*  CALCIUM 8.6* 8.3* 8.4* 8.5* 8.2*  MG 2.0 2.0 2.0 2.4 2.1  PHOS 3.3 3.8 3.6 7.2* 4.5   GFR: Estimated Creatinine Clearance: 38.9 mL/min (A) (by C-G formula based on SCr of 1.62 mg/dL (H)). Liver Function Tests: Recent Labs  Lab 04/25/20 0500 04/26/20 0545 04/27/20 0603 04/28/20 0604 05/01/20 0408  AST 23 21 24 23  39  ALT 15 13 12 13 25   ALKPHOS 37* 35* 42 47 70  BILITOT 0.7 0.4 0.5 0.5 0.4  PROT 5.4* 5.3* 5.3* 5.3* 5.0*  ALBUMIN 2.6* 2.4* 2.5* 2.3* 2.0*   No results for input(s): LIPASE, AMYLASE in the last 168 hours. No results for input(s): AMMONIA in the last 168 hours. Coagulation Profile: No results for input(s): INR, PROTIME in the last 168 hours. Cardiac Enzymes: No results for input(s): CKTOTAL, CKMB, CKMBINDEX, TROPONINI in the last 168 hours. BNP (last 3 results) No results for input(s): PROBNP in the last 8760 hours. HbA1C: No results for input(s): HGBA1C in the last 72 hours. CBG: Recent Labs  Lab 04/30/20 0631 04/30/20 0801 04/30/20 1557 04/30/20 2326 05/01/20 0725  GLUCAP 145* 128* 131* 141* 150*   Lipid Profile: Recent Labs    05/01/20 0408  TRIG 141   Thyroid Function Tests: No results for input(s): TSH, T4TOTAL, FREET4, T3FREE, THYROIDAB in the last 72 hours. Anemia Panel: No results for input(s): VITAMINB12, FOLATE,  FERRITIN, TIBC, IRON, RETICCTPCT in the last 72 hours. Sepsis Labs: No results for input(s): PROCALCITON, LATICACIDVEN in the last 168 hours.  No results found for this or any previous visit (from the past 240 hour(s)).       Radiology Studies: DG Chest 1 View  Result Date: 04/30/2020 CLINICAL DATA:  Shortness of breath and cough. EXAM: CHEST  1 VIEW COMPARISON:  Chest radiograph dated 04/28/2020. FINDINGS: The heart size and mediastinal contours are within normal limits. Opacity of the right hemithorax likely reflects a layering pleural effusion with associated atelectasis. A small left pleural effusion with associated atelectasis is noted. Mild-to-moderate bibasilar airspace opacities likely contribute. Mild diffuse bilateral interstitial opacities are  noted. There is no pneumothorax. A right upper extremity peripherally inserted central venous catheter tip overlies the superior vena cava. An enteric tube enters the stomach and terminates below the field of view. IMPRESSION: Bilateral pleural effusions with associated atelectasis and/or airspace opacities. Mild diffuse bilateral interstitial opacities may represent pulmonary edema or infection. Electronically Signed   By: Zerita Boers M.D.   On: 04/30/2020 15:04        Scheduled Meds: . Chlorhexidine Gluconate Cloth  6 each Topical Daily  . feeding supplement  1 Container Oral TID BM  . insulin aspart  0-9 Units Subcutaneous Q8H  . ipratropium-albuterol  3 mL Nebulization Q6H  . metoprolol tartrate  2.5 mg Intravenous Q6H  . pantoprazole (PROTONIX) IV  40 mg Intravenous Q24H  . sodium chloride flush  10-40 mL Intracatheter Q12H   Continuous Infusions: . TPN ADULT (ION) 75 mL/hr at 04/30/20 1714     LOS: 17 days    Time spent: 35 mins,More than 50% of that time was spent in counseling and/or coordination of care.      Shelly Coss, MD [facility] P8/30/2021, 8:17 AM

## 2020-05-01 NOTE — Progress Notes (Addendum)
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, biopsy showed adenocarcinoma Received cycle 1 of carboplatin and paclitaxel on 04/20/2020 and tolerated well We will continue to monitor for side effects of treatment and monitor lab work closely She developed pancytopenia secondary to chemotherapy-WBC has now recovered but she has anemia and thrombocytopenia Continue supportive care for now  Pancytopenia secondary to chemotherapy Monitor CBC with differential intermittently-does not need a daily CBC No need transfusion support, unless hemoglobin is less than 8 and platelet count less than 10 Hemoglobin is 7.7 today and recommend 1 unit PRBCs   Hyponatremia, resolved Appears to be due to to hypovolemia from poor p.o. intake and hydrochlorothiazide use Sodium level has normalized Monitor closely   AKI due to hydronephrosis, improving Due to dehydration/poor p.o. intake HCTZ and lisinopril placed on hold Status post ureteral stent Creatinine trending upward-received furosemide over the weekend due to worsening respiratory distress Monitor closely   Protein calorie malnutrition Dietitian is following Continue TPN   Abdominal carcinomatosis, hypoactive sounds, nausea, imminent bowel obstruction The patient was on clear liquids only with very little p.o. intake and subsequently developed vomiting Abdominal x-ray confirmed persistent ileus NG tube has been placed with resolution of vomiting NG tube output has increased over the weekend  Repeat abdominal x-ray show persistent ileus She is getting quite uncomfortable with pressure on her nasopharynx I recommend converting the NG tube into venting G-tube I will consult IR  Intermittent cough Chest x-ray from 04/30/2020 showed  bilateral pleural effusions with atelectasis and/or airspace opacities with mild diffuse bilateral interstitial opacities which could represent pulmonary edema or infection Status post a dose of IV Lasix yesterday with some improvement of her breathing I suspect her cough is due at least in part to irritation of her throat from the NG tube With persistent ileus on abdominal x-ray today, we will try to get interventional radiologist at comfort her NG tube into the G-tube WBC is adequate to proceed with G-tube placement   Hypertension Off lisinopril and hydrochlorothiazide secondary to AKI Hydralazine ordered as needed for systolic blood pressure greater than 160 Further management per hospitalist   CAD status post PTCA On metoprolol; aspirin on hold   Possible UTI versus pneumonia versus compression atelectasis Currently off antibiotics Monitor closely for signs of infection  Recent shortness of breath/respiratory distress/vascular congestion with pulmonary edema, resolved Respiratory status improved this morning  Currently off oxygen Monitor closely and further management per hospitalist   Goals of care discussion She is aware she have stage IV disease Treatment goal is palliative The goal for treatment in this hospital stay is to resolve her bowel obstruction prior to discharge   Discharge planning She will likely be here for the next 3 to 5 days All questions were answered. The patient knows to call the clinic with any problems, questions or concerns.   Mikey Bussing, NP 05/01/2020 9:02 AM   Eileen Burgess.MD  Subjective:  The patient was transferred to the telemetry unit over the weekend due to worsening shortness of breath Breathing better this morning NG tube drainage has increased Patient is more tired and just wants to sleep She repeatedly asked to have the NG tube removed Still no bowel movement Patient unsure if she is having any flatus No nausea or vomiting with  NG  tube in place No fevers or chills Denies recurrent vaginal bleeding  Objective:  Vitals:   05/01/20 0455 05/01/20 0738  BP: 111/61   Pulse: 76   Resp: 18   Temp: 97.6 F (36.4 C)   SpO2: 93% 96%     Intake/Output Summary (Last 24 hours) at 05/01/2020 0902 Last data filed at 05/01/2020 0700 Gross per 24 hour  Intake 1566.89 ml  Output 1550 ml  Net 16.89 ml    GENERAL: Alert and awakens to voice, but more somnolent than she was last week RESP: Diminished to bases ABDOMEN: Abdomen more distended this morning, ascites present.  Hypoactive bowel sounds noted Musculoskeletal:no cyanosis of digits and no clubbing  NEURO: alert & oriented x 3 with fluent speech, no focal motor/sensory deficits   Labs:  Recent Labs    04/27/20 0603 04/27/20 0603 04/28/20 0604 04/28/20 0604 04/29/20 0803 04/30/20 0952 05/01/20 0408  NA 134*   < > 136   < > 137 132* 137  K 5.2*   < > 4.5   < > 4.3 5.6* 4.1  CL 102   < > 106   < > 105 102 109  CO2 22   < > 22   < > 22 18* 20*  GLUCOSE 133*   < > 135*   < > 142* 952* 134*  BUN 38*   < > 41*   < > 47* 53* 77*  CREATININE 1.05*   < > 1.05*   < > 1.07* 1.38* 1.62*  CALCIUM 8.6*   < > 8.3*   < > 8.4* 8.5* 8.2*  GFRNONAA 55*   < > 55*   < > 54* 40* 33*  GFRAA >60   < > >60   < > >60 46* 38*  PROT 5.3*  --  5.3*  --   --   --  5.0*  ALBUMIN 2.5*  --  2.3*  --   --   --  2.0*  AST 24  --  23  --   --   --  39  ALT 12  --  13  --   --   --  25  ALKPHOS 42  --  47  --   --   --  70  BILITOT 0.5  --  0.5  --   --   --  0.4   < > = values in this interval not displayed.    Studies: I have reviewed x-ray of her abdomen DG Chest 1 View  Result Date: 04/30/2020 CLINICAL DATA:  Shortness of breath and cough. EXAM: CHEST  1 VIEW COMPARISON:  Chest radiograph dated 04/28/2020. FINDINGS: The heart size and mediastinal contours are within normal limits. Opacity of the right hemithorax likely reflects a layering pleural effusion with associated atelectasis.  A small left pleural effusion with associated atelectasis is noted. Mild-to-moderate bibasilar airspace opacities likely contribute. Mild diffuse bilateral interstitial opacities are noted. There is no pneumothorax. A right upper extremity peripherally inserted central venous catheter tip overlies the superior vena cava. An enteric tube enters the stomach and terminates below the field of view. IMPRESSION: Bilateral pleural effusions with associated atelectasis and/or airspace opacities. Mild diffuse bilateral interstitial opacities may represent pulmonary edema or infection. Electronically Signed   By: Zerita Boers M.D.   On: 04/30/2020 15:04   DG Chest 1 View  Result Date: 04/20/2020 CLINICAL DATA:  Increasing short of breath EXAM: CHEST  1 VIEW COMPARISON:  04/13/2020,  CT 04/15/2020 FINDINGS: Right upper extremity central venous catheter tip over the SVC. Cardiomegaly with vascular congestion and diffuse interstitial opacity consistent with edema. Moderate right-sided pleural effusion, likely layering and resulting in hazy asymmetric appearance of the right thorax. At least small left pleural effusion. Bibasilar consolidations. No pneumothorax. IMPRESSION: 1. Cardiomegaly with vascular congestion and diffuse interstitial pulmonary edema. 2. Bilateral pleural effusions, at least moderate on the right and small on the left. 3. Bibasilar consolidations Electronically Signed   By: Donavan Foil M.D.   On: 04/20/2020 22:20   DG Chest 2 View  Result Date: 04/13/2020 CLINICAL DATA:  Weakness diarrhea EXAM: CHEST - 2 VIEW COMPARISON:  07/28/2004 FINDINGS: Heart size upper normal.  Vascularity normal.  Coronary stent noted. Small bilateral pleural effusions. Mild bibasilar atelectasis/infiltrate. IMPRESSION: Interval development of mild bibasilar airspace disease and small pleural effusions. Negative for heart failure. Electronically Signed   By: Franchot Gallo M.D.   On: 04/13/2020 13:57   DG Abd 1  View  Result Date: 04/29/2020 CLINICAL DATA:  Follow-up ileus EXAM: ABDOMEN - 1 VIEW COMPARISON:  April 27, 2020 FINDINGS: A right ureteral stent is stable. In NG tube is identified. I suspect the distal tip may be in the proximal duodenum. Air-filled prominent colon remains. No small bowel dilatation. No other acute abnormalities. IMPRESSION: 1. Support apparatus as above. 2. Continued air-filled prominent colon may represent ileus. No other abnormalities. Electronically Signed   By: Dorise Bullion III M.D   On: 04/29/2020 09:40   DG Abd 1 View  Result Date: 04/27/2020 CLINICAL DATA:  Obstruction EXAM: ABDOMEN - 1 VIEW COMPARISON:  Portable exam 1047 hours compared 04/22/2020 FINDINGS: RIGHT ureteral stent unchanged. Gaseous distention of colon from tip of cecum through splenic flexure. Absent gas within descending and rectosigmoid colon. Small bowel gas pattern normal. No bowel wall thickening or pathologic calcification. IMPRESSION: Mild gaseous distention of the proximal half the colon with absent gas distally, could reflect ileus but unable to exclude distal colonic obstruction with this appearance. Electronically Signed   By: Lavonia Dana M.D.   On: 04/27/2020 11:01   CT CHEST W CONTRAST  Result Date: 04/15/2020 CLINICAL DATA:  Cancer of unknown primary, staging. EXAM: CT CHEST WITH CONTRAST TECHNIQUE: Multidetector CT imaging of the chest was performed during intravenous contrast administration. CONTRAST:  56mL OMNIPAQUE IOHEXOL 300 MG/ML  SOLN COMPARISON:  None. FINDINGS: Cardiovascular: There is mild calcification of the aortic arch. Normal heart size. No pericardial effusion. A coronary artery stent is seen. Mediastinum/Nodes: No enlarged mediastinal, hilar, or axillary lymph nodes. The thyroid gland and trachea demonstrate no significant findings. There is a small hiatal hernia. Lungs/Pleura: Mild bilateral lower lobe atelectasis is seen, right slightly greater than left. Very mild slightly  nodular appearing scarring and/or atelectasis is seen along the posterior aspect of the inferior left upper lobe. There is a small left pleural effusion. A moderate sized right pleural effusion is also noted. No pneumothorax is identified. Upper Abdomen: A moderate amount of abdominal free fluid is seen. There is moderate to marked severity right-sided hydronephrosis with delayed renal cortical enhancement involving the right kidney. Musculoskeletal: No chest wall abnormality. No acute or significant osseous findings. Degenerative changes seen throughout the thoracic spine. IMPRESSION: 1. Small left pleural effusion with a moderate sized right pleural effusion. 2. Mild bilateral lower lobe atelectasis, right slightly greater than left. 3. Very mild slightly nodular appearing scarring and/or atelectasis along the posterior aspect of the inferior left upper lobe. 4. Moderate  amount of abdominal free fluid. 5. Moderate to marked severity right-sided hydronephrosis with delayed renal cortical enhancement involving the right kidney. This is suggestive of partial renal obstruction. 6. Small hiatal hernia. 7. Aortic atherosclerosis. Aortic Atherosclerosis (ICD10-I70.0). Electronically Signed   By: Virgina Norfolk M.D.   On: 04/15/2020 15:38   DG Chest Port 1 View  Result Date: 04/28/2020 CLINICAL DATA:  64 year old female with persistent cough. Query fluid overload. EXAM: PORTABLE CHEST 1 VIEW COMPARISON:  Portable chest 04/20/2020 and earlier. FINDINGS: Portable AP semi upright view at 1214 hours. Stable right PICC line. Enteric tube has been placed and courses to the stomach, tip not included. Improved bilateral ventilation, but continued basilar predominant increased interstitial opacity, and dense retrocardiac opacity. Veiling opacity on the right is compatible with a superimposed pleural effusion. No pneumothorax. Upper lung pulmonary vascularity appears more normal now. Mediastinal contours are stable and  within normal limits. Dense retrocardiac opacity without air bronchograms. Paucity of bowel gas. No acute osseous abnormality identified. IMPRESSION: 1. Suspect regression of pulmonary edema since 04/20/2020, but with residual vascular congestion, small right pleural effusion, and superimposed left lower lobe collapse or consolidation. 2. Enteric tube placed into the stomach, tip not included. Electronically Signed   By: Genevie Ann M.D.   On: 04/28/2020 12:35   DG Abd Portable 1V  Result Date: 04/27/2020 CLINICAL DATA:  Nasogastric tube placement EXAM: PORTABLE ABDOMEN - 1 VIEW COMPARISON:  04/27/2020 FINDINGS: Interval placement of esophagogastric tube, tip and side port below the diaphragm, looped in the gastric fundus. Diffusely distended colon similar to prior examination. Partially imaged right double-J ureteral stent. IMPRESSION: 1. Interval placement of esophagogastric tube, tip and side port below the diaphragm, looped in the gastric fundus. 2. Diffusely distended colon similar to prior examination. Electronically Signed   By: Eddie Candle M.D.   On: 04/27/2020 13:33   DG Abd Portable 2V  Result Date: 04/22/2020 CLINICAL DATA:  Decreased bowel sounds EXAM: PORTABLE ABDOMEN - 2 VIEW COMPARISON:  04/21/2020 FINDINGS: Supine frontal views of the abdomen and pelvis are obtained. Right ureteral stent is stable. There is increasing gaseous distention of the colon, most compatible with ileus. No evidence of small-bowel obstruction. No masses or abnormal calcifications. IMPRESSION: 1. Progressive gaseous distention of the colon most consistent with ileus. 2. Stable right ureteral stent. Electronically Signed   By: Randa Ngo M.D.   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 Hypoactive bowel sounds. EXAM: PORTABLE ABDOMEN - 2 VIEW COMPARISON:  KUB 04/20/2020.  CT Abdomen and Pelvis  04/14/2020. FINDINGS: Upright and supine views of the abdomen and pelvis. Stable right double-J ureteral stent. Mildly to moderately gas distended large bowel again noted, with gas present to the mid sigmoid. As before, no dilated small bowel loops. But gas is increased since 04/14/2020. No pneumoperitoneum. Stable lung bases, right pleural effusion again evident. Stable visualized osseous structures. IMPRESSION: 1. Continued gas distended colon to the sigmoid, although no dilated small bowel to strongly suggest mechanical obstruction. Continue to favor ileus. 2. No free air. Stable right ureteral stent. Persistent right pleural effusion. Electronically Signed   By: Genevie Ann M.D.   On: 04/21/2020 13:54   DG Abd Portable 2V  Result Date: 04/20/2020 CLINICAL DATA:  Hypoactive bowel sounds. EXAM: PORTABLE ABDOMEN - 2 VIEW COMPARISON:  No prior. FINDINGS: Double-J right ureteral stent in good anatomic position. Colon  is slightly distended. Cecum measures 8.9 cm in diameter. Although these findings most likely represent colonic ileus follow-up exam suggested to exclude colonic obstruction. No small bowel distention. No free air. Bibasilar atelectasis/infiltrates. Small right pleural effusion. IMPRESSION: 1.  Double-J right ureteral stent in good anatomic position. 2. Colon is slightly distended. Cecum measures 8.9 cm. Although these findings most likely represent colonic ileus, follow-up exam suggested to exclude colonic obstruction. No small bowel distention. No free air. 3.  Bibasilar atelectasis/infiltrates. Electronically Signed   By: Marcello Moores  Register   On: 04/20/2020 13:00   DG C-Arm 1-60 Min-No Report  Result Date: 04/15/2020 Fluoroscopy was utilized by the requesting physician.  No radiographic interpretation.   CT RENAL STONE STUDY  Result Date: 04/14/2020 CLINICAL DATA:  64 year old female with hematuria. EXAM: CT ABDOMEN AND PELVIS WITHOUT CONTRAST TECHNIQUE: Multidetector CT imaging of the abdomen  and pelvis was performed following the standard protocol without IV contrast. COMPARISON:  None. FINDINGS: Evaluation of this exam is limited in the absence of intravenous contrast. Lower chest: Partially visualized moderate right and small left pleural effusions. There is associated partial compressive atelectasis of the lower lobes. Pneumonia is not excluded. Clinical correlation is recommended. There is coronary vascular calcification. Partially visualized trace pericardial effusion. There is no intra-abdominal free air. Small ascites. Hepatobiliary: No focal liver abnormality is seen. No gallstones, gallbladder wall thickening, or biliary dilatation. Pancreas: Unremarkable. No pancreatic ductal dilatation or surrounding inflammatory changes. Spleen: Normal in size without focal abnormality. Adrenals/Urinary Tract: The adrenal glands unremarkable. The left kidney is unremarkable. There is a moderate right hydronephrosis with mild right hydroureter. No stone identified. There is a transition point in the distal right ureter or right UVJ. The urinary bladder is minimally distended. There is possible mild nodular thickening of the posterior bladder wall adjacent to the right UVJ (77/2). There is loss of fat plane between the anterior lower uterus/cervix and posterior bladder wall. The lower uterus/cervical region is somewhat enlarged. Findings concerning for a neoplastic process either arising from the bladder urothelium or from the cervix and invading into the posterior bladder wall with obstruction of the right UVJ or distal right ureter. Stomach/Bowel: There is a small hiatal hernia. There is no bowel obstruction. There is sigmoid diverticulosis. The appendix is not visualized with certainty. No inflammatory changes identified in the right lower quadrant. Vascular/Lymphatic: Moderate aortoiliac atherosclerotic disease. The IVC is unremarkable. No portal venous gas. There is no adenopathy. Reproductive: The uterus  is anteverted. Enlargement of the lower uterus/cervical region as described above. Other: There is diffuse omental nodularity and caking consistent with metastatic disease. Musculoskeletal: Osteopenia. No acute osseous pathology. IMPRESSION: 1. Findings concerning for a neoplastic process either arising from the bladder urothelium or from the cervix and invading into the posterior bladder wall with obstruction of the right UVJ or distal right ureter. There is associated moderate right hydronephrosis. Sampling of the ascitic fluid may provide further diagnostic information. 2. Diffuse omental nodularity and caking consistent with metastatic disease. 3. Moderate right and small left pleural effusions with associated partial compressive atelectasis of the lower lobes. Pneumonia is not excluded. Clinical correlation is recommended. 4. Sigmoid diverticulosis. No bowel obstruction. 5. Aortic Atherosclerosis (ICD10-I70.0). Electronically Signed   By: Anner Crete M.D.   On: 04/14/2020 17:44   [place] EKG SITE RITE  Result Date: 04/18/2020 If Site Rite image not attached, placement could not be confirmed due to current cardiac rhythm.

## 2020-05-01 NOTE — Progress Notes (Signed)
PHARMACY - TOTAL PARENTERAL NUTRITION CONSULT NOTE   Indication: Prolonged ileus  Patient Measurements: Height: 5\' 4"  (162.6 cm) Weight: 98.2 kg (216 lb 7.9 oz) IBW/kg (Calculated) : 54.7 TPN AdjBW (KG): 65.5 Body mass index is 37.16 kg/m. Usual Weight: 98 kg  Assessment: 64 y/o F found to have metastatic gynecologic cancer with carcinomatosis and ureteral obstruction s/p stent placement with minimal oral intake for over a week, possible ileus. Pharmacy consulted to manage TPN.   Glucose / Insulin: CBGs 128-150.  Goal CBGs 100-150. 4 units SSI/24 hours.  Electrolytes: Phos on upper end of normal range. CO2 low at 20. All others, including Corrected Ca, WNL.  -Given concern for possible ileus, target K+ > 4, Mag > 2.  Renal: SCr elevated/increased to 1.62. BUN elevated/increased to 77.  LFTs / TGs: WNL/ TG 194 (8/24), 141 (8/30) Prealbumin / albumin: 8.8/ 2 Intake / Output; MIVF: I/O 1567/1850 (including urine and NG output) MIVF: none, NS stopped by MD on 8/29 GI Imaging: 8/21 Abd x-ray: ileus vs obstruction 8/26 Abd x-ray: possible ileus, unable to exclude distal colonic obstruction 8/28 Abd x-ray: Ureteral stent is stable.  Continued air-filled prominent colon may represent ileus 8/30 Abd x-ray: Mildly dilated small bowel loops in right abdomen appear slightly increased. Stable moderate gaseous distention of the colon. Findings are compatible with persistent adynamic ileus.  Surgeries / Procedures: 8/14 ureteral stent placement  Significant Events: -8/26: N/V, NGT placed. CLD > NPO  Central access: PICC 8/18 TPN start date: 8/23  Nutritional Goals (per RD recommendation on 8/26): kCal: 1700-1900, Protein: 80-90g, Fluid: 1.8 L/d Goal TPN rate is 75 mL/hr (provides 90 g of protein and 1821 kcals per day); meeting >100% of patient needs  Note:  Intralipid product ordered in TPN from 8/23-8/28.  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 Pharmacist was informed on 8/28 that  SMOFlipids had been used to prepare the TPN from 8/23-8/27 despite the Intralipid orders and that Intralipid was not available for the TPN on 8/28. The order was changed from Intralipid to SMOFlipids. Will continue with SMOFlipids at this time, as it appears she has been tolerating for several days.   Current Nutrition:  TPN, NPO  Plan:   Continue TPN at goal rate of 75 mL/hr  Electrolytes in TPN:   Na 78mEq/L,continue K+ 50mEq/L, Ca 10mEq/L, Mag 5 mEq/L, decrease Phos to 10 mmol/L  Cl:Ac ratio 1:2  Add standard MVI and trace elements to TPN  CBG check + sensitive SSI q8h  MIVF per MD (none at this time)   Monitor TPN labs on Monday/Thursday  CMET, Mag, Phos in AM  Monitor for ability to advance diet   Lindell Spar, PharmD, [place] [phone] 05/01/2020 10:33 AM

## 2020-05-01 NOTE — Progress Notes (Signed)
PT Cancellation Note  Patient Details Name: Eileen Burgess MRN: [id number] DOB: 1955/11/05   Cancelled Treatment:    Reason Eval/Treat Not Completed: Patient declined, no reason specified    Doreatha Massed, PT Acute Rehabilitation  Office: [phone] Pager: [phone]

## 2020-05-01 NOTE — Progress Notes (Signed)
GYN Oncology Follow Up  Patient sleeping in bed in no acute distress.  Responding to verbal stimuli. States she is ready to get the NG tube out.  States she was moved to telemetry over the weekend because she had another episode of difficulty breathing on Saturday evening. She feels her breathing has improved since that time.  Still with congested cough.  She reports feeling tired. No flatus or BM reported. 500 cc of dark output in the NG canister. Dark orange colored urine from the purewick. Lower extrem edema improved. Exam performed by K. Curcio NP while seeing the pt together-abdomen remains distended, tympanic. No concerns voiced.   Continue with NG tube to intermittent suction, TPN per pharmacy. Continue plan of care per Hospitalist Team, Dr. Alvy Bimler. Abd 1 view ordered for this am.

## 2020-05-02 ENCOUNTER — Inpatient Hospital Stay (HOSPITAL_COMMUNITY): Payer: Medicare Other

## 2020-05-02 DIAGNOSIS — R180 Malignant ascites: Secondary | ICD-10-CM

## 2020-05-02 LAB — COMPREHENSIVE METABOLIC PANEL
ALT: 28 U/L (ref 0–44)
AST: 31 U/L (ref 15–41)
Albumin: 1.9 g/dL — ABNORMAL LOW (ref 3.5–5.0)
Alkaline Phosphatase: 70 U/L (ref 38–126)
Anion gap: 11 (ref 5–15)
BUN: 85 mg/dL — ABNORMAL HIGH (ref 8–23)
CO2: 20 mmol/L — ABNORMAL LOW (ref 22–32)
Calcium: 8.3 mg/dL — ABNORMAL LOW (ref 8.9–10.3)
Chloride: 106 mmol/L (ref 98–111)
Creatinine, Ser: 1.89 mg/dL — ABNORMAL HIGH (ref 0.44–1.00)
GFR calc Af Amer: 31 mL/min — ABNORMAL LOW (ref >60)
GFR calc non Af Amer: 27 mL/min — ABNORMAL LOW (ref >60)
Glucose, Bld: 129 mg/dL — ABNORMAL HIGH (ref 70–99)
Potassium: 3.9 mmol/L (ref 3.5–5.1)
Sodium: 137 mmol/L (ref 135–145)
Total Bilirubin: 0.5 mg/dL (ref 0.3–1.2)
Total Protein: 5 g/dL — ABNORMAL LOW (ref 6.5–8.1)

## 2020-05-02 LAB — CBC WITH DIFFERENTIAL/PLATELET
Abs Immature Granulocytes: 0.31 10*3/uL — ABNORMAL HIGH (ref 0.00–0.07)
Basophils Absolute: 0 10*3/uL (ref 0.0–0.1)
Basophils Relative: 1 %
Eosinophils Absolute: 0.1 10*3/uL (ref 0.0–0.5)
Eosinophils Relative: 2 %
HCT: 22.2 % — ABNORMAL LOW (ref 36.0–46.0)
Hemoglobin: 7.2 g/dL — ABNORMAL LOW (ref 12.0–15.0)
Immature Granulocytes: 5 %
Lymphocytes Relative: 14 %
Lymphs Abs: 0.9 10*3/uL (ref 0.7–4.0)
MCH: 27.8 pg (ref 26.0–34.0)
MCHC: 32.4 g/dL (ref 30.0–36.0)
MCV: 85.7 fL (ref 80.0–100.0)
Monocytes Absolute: 0.6 10*3/uL (ref 0.1–1.0)
Monocytes Relative: 10 %
Neutro Abs: 4.5 10*3/uL (ref 1.7–7.7)
Neutrophils Relative %: 68 %
Platelets: 79 10*3/uL — ABNORMAL LOW (ref 150–400)
RBC: 2.59 MIL/uL — ABNORMAL LOW (ref 3.87–5.11)
RDW: 14.6 % (ref 11.5–15.5)
WBC: 6.5 10*3/uL (ref 4.0–10.5)
nRBC: 0.3 % — ABNORMAL HIGH (ref 0.0–0.2)

## 2020-05-02 LAB — URINALYSIS, ROUTINE W REFLEX MICROSCOPIC
Bilirubin Urine: NEGATIVE
Glucose, UA: NEGATIVE mg/dL
Ketones, ur: NEGATIVE mg/dL
Nitrite: NEGATIVE
Protein, ur: 30 mg/dL — AB
RBC / HPF: >50 RBC/hpf — ABNORMAL HIGH (ref 0–5)
Specific Gravity, Urine: 1.015 (ref 1.005–1.030)
pH: 5 (ref 5.0–8.0)

## 2020-05-02 LAB — SODIUM, URINE, RANDOM: Sodium, Ur: 24 mmol/L

## 2020-05-02 LAB — GLUCOSE, CAPILLARY
Glucose-Capillary: 122 mg/dL — ABNORMAL HIGH (ref 70–99)
Glucose-Capillary: 130 mg/dL — ABNORMAL HIGH (ref 70–99)
Glucose-Capillary: 131 mg/dL — ABNORMAL HIGH (ref 70–99)

## 2020-05-02 LAB — MAGNESIUM: Magnesium: 2.2 mg/dL (ref 1.7–2.4)

## 2020-05-02 LAB — PREPARE RBC (CROSSMATCH)

## 2020-05-02 LAB — ABO/RH: ABO/RH(D): O POS

## 2020-05-02 LAB — PHOSPHORUS: Phosphorus: 4.9 mg/dL — ABNORMAL HIGH (ref 2.5–4.6)

## 2020-05-02 MED ORDER — FLEET ENEMA 7-19 GM/118ML RE ENEM: 1.0000 | ENEMA | Freq: Once | RECTAL | Status: DC

## 2020-05-02 MED ORDER — SODIUM CHLORIDE 0.9% IV SOLUTION: Freq: Once | INTRAVENOUS | Status: AC

## 2020-05-02 MED ORDER — TRAVASOL 10 % IV SOLN
INTRAVENOUS | Status: AC
  Filled 2020-05-02: qty 900

## 2020-05-02 MED ORDER — IPRATROPIUM-ALBUTEROL 0.5-2.5 (3) MG/3ML IN SOLN
3.0000 mL | Freq: Three times a day (TID) | RESPIRATORY_TRACT | Status: DC
  Administered 2020-05-03: 3 mL via RESPIRATORY_TRACT
  Filled 2020-05-02 (×2): qty 3

## 2020-05-02 MED ORDER — LIDOCAINE HCL 1 % IJ SOLN
INTRAMUSCULAR | Status: AC
  Filled 2020-05-02: qty 20

## 2020-05-02 MED ORDER — FLEET ENEMA 7-19 GM/118ML RE ENEM
1.0000 | ENEMA | RECTAL | Status: AC
  Administered 2020-05-02 (×2): 1 via RECTAL
  Filled 2020-05-02 (×2): qty 1

## 2020-05-02 NOTE — Progress Notes (Addendum)
Patient ID: Eileen Burgess, female   DOB: October 04, 1955, 64 y.o.   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  No results with suppositories yesterday not passing any flatus.  Says her abdomen feels very tight.  NG with about 600 cc out last shift, no output currently-nursing to assess, flush  Oncology has scheduled for paracentesis this a.m.     Objective   Vital signs in last 24 hours: Temp:  [97.7 F (36.5 C)-98.9 F (37.2 C)] 98.3 F (36.8 C) (08/31 0606) Pulse Rate:  [89-98] 92 (08/31 0606) Resp:  [20-28] 20 (08/31 0606) BP: (94-111)/(38-97) 99/58 (08/31 0606) SpO2:  [90 %-97 %] 90 % (08/31 0823) Last BM Date: 04/15/20 (per the charting) General: Older white female in NAD, NG in place Heart:  Regular rate and rhythm; no murmurs Lungs: Respirations even and unlabored, lungs CTA bilaterally Abdomen:   Abdomen distended, fairly tight, mild diffuse tenderness, no rebound, bowel sounds quiet Extremities:  Without edema. Neurologic:  Alert and oriented,  grossly normal neurologically. Psych:  Cooperative. Normal mood and affect.  Intake/Output from previous day: 08/30 0701 - 08/31 0700 In: 1524.2 [I.V.:1524.2] Out: 1450 [Urine:300; Emesis/NG output:1150] Intake/Output this shift: No intake/output data recorded.  Lab Results: Recent Labs     05/01/20 0408  WBC 5.0  HGB 7.7*  HCT 24.2*  PLT 77*   BMET Recent Labs    04/30/20 0952 05/01/20 0408 05/02/20 0337  NA 132* 137 137  K 5.6* 4.1 3.9  CL 102 109 106  CO2 18* 20* 20*  GLUCOSE 952* 134* 129*  BUN 53* 77* 85*  CREATININE 1.38* 1.62* 1.89*  CALCIUM 8.5* 8.2* 8.3*   LFT Recent Labs    05/02/20 0337  PROT 5.0*  ALBUMIN 1.9*  AST 31  ALT 28  ALKPHOS 70  BILITOT 0.5    Studies/Results: CT ABDOMEN WO CONTRAST  Result Date: 05/02/2020 CLINICAL DATA:  Evaluate anatomy prior to potential percutaneous gastrostomy tube placement. EXAM: CT ABDOMEN WITHOUT CONTRAST TECHNIQUE: Multidetector CT imaging of the abdomen was performed following the standard protocol without IV contrast. COMPARISON:  CT abdomen pelvis - 04/14/2020; abdominal radiograph-05/01/2020; 04/29/2020; 04/27/2020 FINDINGS: Lack of intravenous contrast limits the ability to evaluate solid abdominal organs. Lower chest: Limited visualization of the lower thorax demonstrates small to moderate-sized bilateral pleural effusions with associated bibasilar consolidative opacities, right greater than left. Borderline cardiomegaly.  Small pericardial effusion. Hepatobiliary: There is mild nodularity of the hepatic contour suggestive of cirrhotic change. Normal noncontrast appearance of the gallbladder given degree distention. No radiopaque gallstones. Moderate volume intra-abdominal ascites. Pancreas: Normal noncontrast appearance of the pancreas. Spleen: Normal noncontrast appearance of the spleen. Adrenals/Urinary Tract: Post right-sided ureteral stent placement. The right kidney is slightly atrophic in comparison to the left. No renal stones. There is a minimal amount likely age and body habitus related bilateral perinephric stranding. No urine obstruction. Normal noncontrast appearance the bilateral adrenal glands. The  urinary bladder was not imaged. Stomach/Bowel: The anterior wall of the gastric antrum is well  apposed against the ventral wall of the upper abdomen without interposition of the hepatic parenchyma or transverse colon. Enteric tube tip terminates within the descending portion of the duodenum. There is moderate distension majority of the visualized loops of large and small bowel, similar to abdominal radiographs, incompletely evaluated though potentially the sequela of ileus. No pneumoperitoneum, pneumatosis or portal venous gas. Vascular/Lymphatic: Moderate amount of atherosclerotic plaque within a normal caliber abdominal aorta. No bulky retroperitoneal or mesenteric adenopathy on this noncontrast examination. Other: Moderate to large amount of diffuse body wall anasarca, most conspicuous about the midline of the low back and bilateral flanks. Musculoskeletal: No acute or aggressive osseous abnormalities. Stigmata of dish within the lower thoracic spine. IMPRESSION: 1. Despite amenable gastric anatomy, the presence moderate volume intra-abdominal ascites renders the patient a poor candidate for gastrostomy tube placement. 2. Suspected ileus, incompletely evaluated. 3. Moderate sized bilateral pleural effusions with associated bibasilar atelectasis, right greater than left. 4. Small pericardial effusion. Further evaluation cardiac echo could be performed as indicated. 5.  Aortic Atherosclerosis (ICD10-I70.0). 6. Post right-sided ureteral stent placement with mild asymmetric atrophy of the right kidney comparison to left. No evidence of urinary obstruction. Electronically Signed   By: Sandi Mariscal M.D.   On: 05/02/2020 08:14   DG Chest 1 View  Result Date: 04/30/2020 CLINICAL DATA:  Shortness of breath and cough. EXAM: CHEST  1 VIEW COMPARISON:  Chest radiograph dated 04/28/2020. FINDINGS: The heart size and mediastinal contours are within normal limits. Opacity of the right hemithorax likely reflects a layering pleural effusion with associated atelectasis. A small left pleural effusion with associated  atelectasis is noted. Mild-to-moderate bibasilar airspace opacities likely contribute. Mild diffuse bilateral interstitial opacities are noted. There is no pneumothorax. A right upper extremity peripherally inserted central venous catheter tip overlies the superior vena cava. An enteric tube enters the stomach and terminates below the field of view. IMPRESSION: Bilateral pleural effusions with associated atelectasis and/or airspace opacities. Mild diffuse bilateral interstitial opacities may represent pulmonary edema or infection. Electronically Signed   By: Zerita Boers M.D.   On: 04/30/2020 15:04   DG Abd 1 View  Result Date: 05/01/2020 CLINICAL DATA:  Follow-up ileus EXAM: ABDOMEN - 1 VIEW COMPARISON:  04/29/2020 abdominal radiograph FINDINGS: Enteric tube terminates in the descending duodenum. Stable right nephroureteral stent. Mildly dilated small bowel loops in right abdomen appears slightly increased. Moderate gaseous distention of the colon appears similar. No evidence of pneumatosis or pneumoperitoneum. No radiopaque nephrolithiasis. IMPRESSION: 1. Enteric tube terminates in the descending duodenum. 2. Mildly dilated small bowel loops in right abdomen appear slightly increased. Stable moderate gaseous distention of the colon. Findings are compatible with persistent adynamic ileus. Electronically Signed   By: Ilona Sorrel M.D.   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  She received initial chemotherapy 04/20/2020.  #2 hydronephrosis secondary to above-with acute kidney injury, creatinine up today  #3 new onset nausea and vomiting over the past few days, repeat imaging with finding of rather diffuse small and large bowel ileus, also with moderate ascites. Suspect this is all secondary to carcinomatosis. IR unable to place venting G tube secondary to ascites.   Likewise GI cannot place PEG tube as contraindicated with ascites  Would consult general surgery to see  if surgical G-tube is feasible.  We will check abdominal films this a.m., assess NG placement. We will also give fleets enemas x2, and then place rectal tube for venting purposes.  Oncology has ordered paracentesis today, to be sent for cytology  #4 recent pulmonary edema-resolved #5 Coronary artery disease status post PTCA #6 malnutrition-on TPN   Principal Problem:   Cervical cancer ([place]) Active Problems:   Essential hypertension   UTI (urinary tract infection)   AKI (acute kidney injury) ([place])   Hyponatremia   Orthostatic hypotension   Hyponatremia syndrome    LOS: 18 days   Amy EsterwoodPA-C  05/02/2020, 8:43 AM      Attending Physician Note   I have taken an interval history, reviewed the chart and examined the patient. I agree with the Advanced Practitioner's note, impression and recommendations.   Stage IV cervical cancer with carcinomatosis. Ascites and ileus have not improved.  IR placed G-tube, PEG tube for venting are contraindicated due to ascites. Consider surgically placed G-tube.  Fleets enemas and then rectal tube placement. Continue NGT to LIS. Check abd film for NG position. Oncology has ordered a paracentesis today.   Lucio Edward, MD [facility]

## 2020-05-02 NOTE — Procedures (Signed)
Ultrasound-guided diagnostic and therapeutic paracentesis performed yielding 3.7 liters of slightly hazy, amber fluid. No immediate complications. A portion of the fluid was sent to the lab for preordered studies. EBL none.

## 2020-05-02 NOTE — Progress Notes (Signed)
PT Cancellation Note  Patient Details Name: Eileen Burgess MRN: [id number] DOB: 10-09-1955   Cancelled Treatment:    Reason Eval/Treat Not Completed: Patient at procedure or test/unavailable   [facility] 05/02/2020, 10:27 AM

## 2020-05-02 NOTE — Progress Notes (Signed)
GYN Oncology Follow Up  64 year old female currently admitted for metastatic cervical cancer s/p stent placement and cycle 1 of carboplatin and taxol on 04/20/2020 with prolonged ileus requiring TPN and NG tube placement.   Patient reports throwing up around the NG tube. Nausea intermittent. Issues with breathing are "off and on."  No flatus or BM reported. Abdomen remain tight. Feels weak and tired.  Patient appears lethargic, closing eyes intermittently, speaking softly due to sore throat. Scattered rales. Abdomen remains distended with faint bowel sounds. Tympanic more in upper quadrants. Mild bilateral pedal edema. No drainage noted from NG. Canister, tubing, NG filter changed. NG tubing pulled back 2 inches and secured. Suction assessed with adequate suction noted. NG flushed.   CT performed last pm.  IR does not feel G tube placement is possible. Awaiting GI and Gen Surg input. Continue management per Hospitalist and Dr. Alvy Bimler.

## 2020-05-02 NOTE — Progress Notes (Signed)
PHARMACY - TOTAL PARENTERAL NUTRITION CONSULT NOTE   Indication: Prolonged ileus  Patient Measurements: Height: 5\' 4"  (162.6 cm) Weight: 105 kg (231 lb 7.7 oz) IBW/kg (Calculated) : 54.7 TPN AdjBW (KG): 65.5 Body mass index is 39.73 kg/m. Usual Weight: 98 kg  Assessment: 64 y/o F found to have metastatic gynecologic cancer with carcinomatosis and ureteral obstruction s/p stent placement with minimal oral intake for over a week, possible ileus. Pharmacy consulted to manage TPN.   Glucose / Insulin: CBGs 130-150.  Goal CBGs 100-150. 3 units SSI/24 hours.  Electrolytes: Phos elevated despite decreasing Phos in TPN yesterday. K+ slightly below goal at 3.9. CO2 remains low at 20. All others, including Corrected Ca, WNL.  -Given concern for possible ileus, target K+ > 4, Mag > 2.  Renal: SCr elevated/increased to 1.89. BUN elevated/increased to 85.  LFTs / TGs: WNL/ TG 194 (8/24), 141 (8/30) Prealbumin / albumin: 8.8 (8/30)/ 1.9 (8/31) Intake / Output; MIVF: I/O 1524/1450 (including urine and NG output) MIVF: none, NS stopped by MD on 8/29 GI Imaging: 8/21 Abd x-ray: ileus vs obstruction 8/26 Abd x-ray: possible ileus, unable to exclude distal colonic obstruction 8/28 Abd x-ray: Ureteral stent is stable.  Continued air-filled prominent colon may represent ileus 8/30 Abd x-ray: Mildly dilated small bowel loops in right abdomen appear slightly increased. Stable moderate gaseous distention of the colon. Findings are compatible with persistent adynamic ileus.  Surgeries / Procedures: 8/14 ureteral stent placement  Significant Events: -8/26: N/V, NGT placed. CLD > NPO -8/30: IR consulted by Onc to convert NG tube into venting G-tube  Central access: PICC 8/18 TPN start date: 8/23  Nutritional Goals (per RD recommendation on 8/26): kCal: 1700-1900, Protein: 80-90g, Fluid: 1.8 L/d Goal TPN rate is 75 mL/hr (provides 90 g of protein and 1821 kcals per day); meeting >100% of patient  needs  Note:  Intralipid product ordered in TPN from 8/23-8/28.  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 Pharmacist was informed on 8/28 that SMOFlipids had been used to prepare the TPN from 8/23-8/27 despite the Intralipid orders and that Intralipid was not available for the TPN on 8/28. The order was changed from Intralipid to SMOFlipids. Will continue with SMOFlipids at this time, as it appears she has been tolerating for several days.   Current Nutrition:  TPN, NPO  Plan:   Continue TPN at goal rate of 75 mL/hr  Electrolytes in TPN:   Na 75mEq/L,increase K+ to 28mEq/L, Ca 20mEq/L, Mag 5 mEq/L, remove Phos  Cl:Ac ratio max Acetate   Add standard MVI and trace elements to TPN  CBG check + sensitive SSI q8h  MIVF per MD (none at this time)   Monitor TPN labs on Monday/Thursday  CMET, Mag, Phos in AM  Monitor for ability to advance diet   Lindell Spar, PharmD, [place] [phone] 05/02/2020 7:35 AM

## 2020-05-02 NOTE — Progress Notes (Addendum)
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, biopsy showed adenocarcinoma Received cycle 1 of carboplatin and paclitaxel on 04/20/2020 and tolerated well We will continue to monitor for side effects of treatment and monitor lab work closely She developed pancytopenia secondary to chemotherapy-WBC has now recovered but she has anemia and thrombocytopenia Continue supportive care for now Repeat CBC pending  Pancytopenia secondary to chemotherapy Recommend transfusion if hemoglobin is less than 8 and platelet count less than 10 She looks pale, repeat CBC is pending, will transfuse if hemoglobin drops further   Hyponatremia, resolved Appears to be due to to hypovolemia from poor p.o. intake and hydrochlorothiazide use Sodium level has normalized Monitor closely   AKI due to hydronephrosis Due to dehydration/poor p.o. intake HCTZ and lisinopril placed on hold Status post ureteral stent Creatinine trending upward Unable to increase IV fluids due to pulmonary edema and respiratory issues Recommend nephrology consult to evaluate renal insufficiency -discussed with hospitalist and asked him to call consult   Protein calorie malnutrition Dietitian is following Continue TPN   Abdominal carcinomatosis, hypoactive sounds, nausea, imminent bowel obstruction The patient was on clear liquids only with very little p.o. intake and subsequently developed vomiting Abdominal x-ray confirmed persistent ileus NG tube has been placed The patient has developed vomiting and NG tube seems to not be putting out a lot of output IR was consulted to evaluate for possible palliative G-tube placement CT of the abdomen pelvis shows minimal gastric anatomy, but she has moderate  ascites Ileus suspected but not fully evaluated IR indicates that the patient is not a candidate for G-tube placement from their standpoint due to ascites, of carcinomatosis, increased risk of peritonitis/tumor spread associated with it -they have recommended GI or general surgery evaluation for G-tube placement  She is quite uncomfortable with the pressure of the NG tube on her nasopharynx I have sent a message to GI to ask them to evaluate for G-tube placement from their standpoint If GI unable to place, would recommend general surgery consult  Ascites Moderate ascites noted on CT scan Order placed for ultrasound-guided paracentesis by IR I have requested for fluid to be sent for cytology  Intermittent cough Chest x-ray from 04/30/2020 showed bilateral pleural effusions with atelectasis and/or airspace opacities with mild diffuse bilateral interstitial opacities which could represent pulmonary edema or infection I suspect her cough is due at least in part to irritation of her throat from the NG tube She also has bilateral pleural effusions and recommend thoracentesis by IR Due to paracentesis being performed today, will need to order thoracentesis tomorrow   Hypertension Off lisinopril and hydrochlorothiazide secondary to AKI Hydralazine ordered as needed for systolic blood pressure greater than 160 Further management per hospitalist   CAD status post PTCA On metoprolol; aspirin on hold   Possible UTI versus pneumonia versus compression atelectasis Currently off antibiotics Monitor closely for signs of infection  Recent shortness of breath/respiratory distress/vascular congestion with pulmonary edema, resolved Remains off oxygen but still having some shortness of breath She has received Lasix on several occasions this admission Again recommend thoracentesis by IR   Goals of care discussion She is aware she have stage IV disease Treatment goal is palliative The goal for  treatment in this hospital stay is to resolve her bowel  obstruction prior to discharge   Discharge planning She will likely be here for the next 3 to 5 days All questions were answered. The patient knows to call the clinic with any problems, questions or concerns.   Mikey Bussing, NP 05/02/2020 10:05 AM  [name], MD  Subjective:  Remains very weak this morning Nursing reports that she has been vomiting despite NG tube being in place They have been given Zofran around-the-clock NG tube suction was adjusted and NG tube was flushed and repositioned, but output did not increase Still no bowel movement or flatus Has cough and seems to be more short of breath this morning No fevers or chills Had a CT of the abdomen pelvis to evaluate anatomy for possible palliative G-tube placement Although she has minimal gastric anatomy, she has moderate intra-abdominal ascites making her a poor candidate for gastrostomy tube placement The CT also showed moderate sized bilateral pleural effusions and a small pericardial effusion  Objective:  Vitals:   05/02/20 0606 05/02/20 0823  BP: (!) 99/58   Pulse: 92   Resp: 20   Temp: 98.3 F (36.8 C)   SpO2: 91% 90%     Intake/Output Summary (Last 24 hours) at 05/02/2020 1005 Last data filed at 05/02/2020 0600 Gross per 24 hour  Intake 1524.2 ml  Output 1450 ml  Net 74.2 ml    GENERAL: Alert and awakens to voice, still somnolent RESP: Rales noted to lung fields ABDOMEN: Abdomen more distended this morning, ascites present.  Hypoactive bowel sounds noted Musculoskeletal:no cyanosis of digits and no clubbing  NEURO: alert & oriented x 3 with fluent speech, no focal motor/sensory deficits   Labs:  Recent Labs    04/28/20 0604 04/29/20 0803 04/30/20 0952 05/01/20 0408 05/02/20 0337  NA 136   < > 132* 137 137  K 4.5   < > 5.6* 4.1 3.9  CL 106   < > 102 109 106  CO2 22   < > 18* 20* 20*  GLUCOSE 135*   < > 952* 134* 129*  BUN 41*   < > 53*  77* 85*  CREATININE 1.05*   < > 1.38* 1.62* 1.89*  CALCIUM 8.3*   < > 8.5* 8.2* 8.3*  GFRNONAA 55*   < > 40* 33* 27*  GFRAA >60   < > 46* 38* 31*  PROT 5.3*  --   --  5.0* 5.0*  ALBUMIN 2.3*  --   --  2.0* 1.9*  AST 23  --   --  39 31  ALT 13  --   --  25 28  ALKPHOS 47  --   --  70 70  BILITOT 0.5  --   --  0.4 0.5   < > = values in this interval not displayed.    Studies: I have reviewed x-ray of her abdomen CT ABDOMEN WO CONTRAST  Result Date: 05/02/2020 CLINICAL DATA:  Evaluate anatomy prior to potential percutaneous gastrostomy tube placement. EXAM: CT ABDOMEN WITHOUT CONTRAST TECHNIQUE: Multidetector CT imaging of the abdomen was performed following the standard protocol without IV contrast. COMPARISON:  CT abdomen pelvis - 04/14/2020; abdominal radiograph-05/01/2020; 04/29/2020; 04/27/2020 FINDINGS: Lack of intravenous contrast limits the ability to evaluate solid abdominal organs. Lower chest: Limited visualization of the lower thorax demonstrates small to moderate-sized bilateral pleural effusions with associated bibasilar consolidative opacities, right greater than left. Borderline cardiomegaly.  Small pericardial effusion. Hepatobiliary: There is mild nodularity of the hepatic contour suggestive of cirrhotic change. Normal  noncontrast appearance of the gallbladder given degree distention. No radiopaque gallstones. Moderate volume intra-abdominal ascites. Pancreas: Normal noncontrast appearance of the pancreas. Spleen: Normal noncontrast appearance of the spleen. Adrenals/Urinary Tract: Post right-sided ureteral stent placement. The right kidney is slightly atrophic in comparison to the left. No renal stones. There is a minimal amount likely age and body habitus related bilateral perinephric stranding. No urine obstruction. Normal noncontrast appearance the bilateral adrenal glands. The urinary bladder was not imaged. Stomach/Bowel: The anterior wall of the gastric antrum is well apposed  against the ventral wall of the upper abdomen without interposition of the hepatic parenchyma or transverse colon. Enteric tube tip terminates within the descending portion of the duodenum. There is moderate distension majority of the visualized loops of large and small bowel, similar to abdominal radiographs, incompletely evaluated though potentially the sequela of ileus. No pneumoperitoneum, pneumatosis or portal venous gas. Vascular/Lymphatic: Moderate amount of atherosclerotic plaque within a normal caliber abdominal aorta. No bulky retroperitoneal or mesenteric adenopathy on this noncontrast examination. Other: Moderate to large amount of diffuse body wall anasarca, most conspicuous about the midline of the low back and bilateral flanks. Musculoskeletal: No acute or aggressive osseous abnormalities. Stigmata of dish within the lower thoracic spine. IMPRESSION: 1. Despite amenable gastric anatomy, the presence moderate volume intra-abdominal ascites renders the patient a poor candidate for gastrostomy tube placement. 2. Suspected ileus, incompletely evaluated. 3. Moderate sized bilateral pleural effusions with associated bibasilar atelectasis, right greater than left. 4. Small pericardial effusion. Further evaluation cardiac echo could be performed as indicated. 5.  Aortic Atherosclerosis (ICD10-I70.0). 6. Post right-sided ureteral stent placement with mild asymmetric atrophy of the right kidney comparison to left. No evidence of urinary obstruction. Electronically Signed   By: Sandi Mariscal M.D.   On: 05/02/2020 08:14   DG Chest 1 View  Result Date: 04/30/2020 CLINICAL DATA:  Shortness of breath and cough. EXAM: CHEST  1 VIEW COMPARISON:  Chest radiograph dated 04/28/2020. FINDINGS: The heart size and mediastinal contours are within normal limits. Opacity of the right hemithorax likely reflects a layering pleural effusion with associated atelectasis. A small left pleural effusion with associated atelectasis  is noted. Mild-to-moderate bibasilar airspace opacities likely contribute. Mild diffuse bilateral interstitial opacities are noted. There is no pneumothorax. A right upper extremity peripherally inserted central venous catheter tip overlies the superior vena cava. An enteric tube enters the stomach and terminates below the field of view. IMPRESSION: Bilateral pleural effusions with associated atelectasis and/or airspace opacities. Mild diffuse bilateral interstitial opacities may represent pulmonary edema or infection. Electronically Signed   By: Zerita Boers M.D.   On: 04/30/2020 15:04   DG Chest 1 View  Result Date: 04/20/2020 CLINICAL DATA:  Increasing short of breath EXAM: CHEST  1 VIEW COMPARISON:  04/13/2020, CT 04/15/2020 FINDINGS: Right upper extremity central venous catheter tip over the SVC. Cardiomegaly with vascular congestion and diffuse interstitial opacity consistent with edema. Moderate right-sided pleural effusion, likely layering and resulting in hazy asymmetric appearance of the right thorax. At least small left pleural effusion. Bibasilar consolidations. No pneumothorax. IMPRESSION: 1. Cardiomegaly with vascular congestion and diffuse interstitial pulmonary edema. 2. Bilateral pleural effusions, at least moderate on the right and small on the left. 3. Bibasilar consolidations Electronically Signed   By: Donavan Foil M.D.   On: 04/20/2020 22:20   DG Chest 2 View  Result Date: 04/13/2020 CLINICAL DATA:  Weakness diarrhea EXAM: CHEST - 2 VIEW COMPARISON:  07/28/2004 FINDINGS: Heart size upper normal.  Vascularity  normal.  Coronary stent noted. Small bilateral pleural effusions. Mild bibasilar atelectasis/infiltrate. IMPRESSION: Interval development of mild bibasilar airspace disease and small pleural effusions. Negative for heart failure. Electronically Signed   By: Franchot Gallo M.D.   On: 04/13/2020 13:57   DG Abd 1 View  Result Date: 05/01/2020 CLINICAL DATA:  Follow-up ileus EXAM:  ABDOMEN - 1 VIEW COMPARISON:  04/29/2020 abdominal radiograph FINDINGS: Enteric tube terminates in the descending duodenum. Stable right nephroureteral stent. Mildly dilated small bowel loops in right abdomen appears slightly increased. Moderate gaseous distention of the colon appears similar. No evidence of pneumatosis or pneumoperitoneum. No radiopaque nephrolithiasis. IMPRESSION: 1. Enteric tube terminates in the descending duodenum. 2. Mildly dilated small bowel loops in right abdomen appear slightly increased. Stable moderate gaseous distention of the colon. Findings are compatible with persistent adynamic ileus. Electronically Signed   By: Ilona Sorrel M.D.   On: 05/01/2020 10:05   DG Abd 1 View  Result Date: 04/29/2020 CLINICAL DATA:  Follow-up ileus EXAM: ABDOMEN - 1 VIEW COMPARISON:  April 27, 2020 FINDINGS: A right ureteral stent is stable. In NG tube is identified. I suspect the distal tip may be in the proximal duodenum. Air-filled prominent colon remains. No small bowel dilatation. No other acute abnormalities. IMPRESSION: 1. Support apparatus as above. 2. Continued air-filled prominent colon may represent ileus. No other abnormalities. Electronically Signed   By: Dorise Bullion III M.D   On: 04/29/2020 09:40   DG Abd 1 View  Result Date: 04/27/2020 CLINICAL DATA:  Obstruction EXAM: ABDOMEN - 1 VIEW COMPARISON:  Portable exam 1047 hours compared 04/22/2020 FINDINGS: RIGHT ureteral stent unchanged. Gaseous distention of colon from tip of cecum through splenic flexure. Absent gas within descending and rectosigmoid colon. Small bowel gas pattern normal. No bowel wall thickening or pathologic calcification. IMPRESSION: Mild gaseous distention of the proximal half the colon with absent gas distally, could reflect ileus but unable to exclude distal colonic obstruction with this appearance. Electronically Signed   By: Lavonia Dana M.D.   On: 04/27/2020 11:01   CT CHEST W CONTRAST  Result Date:  04/15/2020 CLINICAL DATA:  Cancer of unknown primary, staging. EXAM: CT CHEST WITH CONTRAST TECHNIQUE: Multidetector CT imaging of the chest was performed during intravenous contrast administration. CONTRAST:  92mL OMNIPAQUE IOHEXOL 300 MG/ML  SOLN COMPARISON:  None. FINDINGS: Cardiovascular: There is mild calcification of the aortic arch. Normal heart size. No pericardial effusion. A coronary artery stent is seen. Mediastinum/Nodes: No enlarged mediastinal, hilar, or axillary lymph nodes. The thyroid gland and trachea demonstrate no significant findings. There is a small hiatal hernia. Lungs/Pleura: Mild bilateral lower lobe atelectasis is seen, right slightly greater than left. Very mild slightly nodular appearing scarring and/or atelectasis is seen along the posterior aspect of the inferior left upper lobe. There is a small left pleural effusion. A moderate sized right pleural effusion is also noted. No pneumothorax is identified. Upper Abdomen: A moderate amount of abdominal free fluid is seen. There is moderate to marked severity right-sided hydronephrosis with delayed renal cortical enhancement involving the right kidney. Musculoskeletal: No chest wall abnormality. No acute or significant osseous findings. Degenerative changes seen throughout the thoracic spine. IMPRESSION: 1. Small left pleural effusion with a moderate sized right pleural effusion. 2. Mild bilateral lower lobe atelectasis, right slightly greater than left. 3. Very mild slightly nodular appearing scarring and/or atelectasis along the posterior aspect of the inferior left upper lobe. 4. Moderate amount of abdominal free fluid. 5. Moderate to marked  severity right-sided hydronephrosis with delayed renal cortical enhancement involving the right kidney. This is suggestive of partial renal obstruction. 6. Small hiatal hernia. 7. Aortic atherosclerosis. Aortic Atherosclerosis (ICD10-I70.0). Electronically Signed   By: Virgina Norfolk M.D.   On:  04/15/2020 15:38   DG Chest Port 1 View  Result Date: 04/28/2020 CLINICAL DATA:  64 year old female with persistent cough. Query fluid overload. EXAM: PORTABLE CHEST 1 VIEW COMPARISON:  Portable chest 04/20/2020 and earlier. FINDINGS: Portable AP semi upright view at 1214 hours. Stable right PICC line. Enteric tube has been placed and courses to the stomach, tip not included. Improved bilateral ventilation, but continued basilar predominant increased interstitial opacity, and dense retrocardiac opacity. Veiling opacity on the right is compatible with a superimposed pleural effusion. No pneumothorax. Upper lung pulmonary vascularity appears more normal now. Mediastinal contours are stable and within normal limits. Dense retrocardiac opacity without air bronchograms. Paucity of bowel gas. No acute osseous abnormality identified. IMPRESSION: 1. Suspect regression of pulmonary edema since 04/20/2020, but with residual vascular congestion, small right pleural effusion, and superimposed left lower lobe collapse or consolidation. 2. Enteric tube placed into the stomach, tip not included. Electronically Signed   By: Genevie Ann M.D.   On: 04/28/2020 12:35   DG Abd Portable 1V  Result Date: 04/27/2020 CLINICAL DATA:  Nasogastric tube placement EXAM: PORTABLE ABDOMEN - 1 VIEW COMPARISON:  04/27/2020 FINDINGS: Interval placement of esophagogastric tube, tip and side port below the diaphragm, looped in the gastric fundus. Diffusely distended colon similar to prior examination. Partially imaged right double-J ureteral stent. IMPRESSION: 1. Interval placement of esophagogastric tube, tip and side port below the diaphragm, looped in the gastric fundus. 2. Diffusely distended colon similar to prior examination. Electronically Signed   By: Eddie Candle M.D.   On: 04/27/2020 13:33   DG Abd Portable 2V  Result Date: 04/22/2020 CLINICAL DATA:  Decreased bowel sounds EXAM: PORTABLE ABDOMEN - 2 VIEW COMPARISON:  04/21/2020  FINDINGS: Supine frontal views of the abdomen and pelvis are obtained. Right ureteral stent is stable. There is increasing gaseous distention of the colon, most compatible with ileus. No evidence of small-bowel obstruction. No masses or abnormal calcifications. IMPRESSION: 1. Progressive gaseous distention of the colon most consistent with ileus. 2. Stable right ureteral stent. Electronically Signed   By: Randa Ngo M.D.   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 Hypoactive bowel sounds. EXAM: PORTABLE ABDOMEN - 2 VIEW COMPARISON:  KUB 04/20/2020.  CT Abdomen and Pelvis 04/14/2020. FINDINGS: Upright and supine views of the abdomen and pelvis. Stable right double-J ureteral stent. Mildly to moderately gas distended large bowel again noted, with gas present to the mid sigmoid. As before, no dilated small bowel loops. But gas is increased since 04/14/2020. No pneumoperitoneum. Stable lung bases, right pleural effusion again evident. Stable visualized osseous structures. IMPRESSION: 1. Continued gas distended colon to the sigmoid, although no dilated small bowel to strongly suggest mechanical obstruction. Continue to favor ileus. 2. No free air. Stable right ureteral stent. Persistent right pleural effusion. Electronically Signed   By: Genevie Ann M.D.   On: 04/21/2020 13:54   DG Abd Portable 2V  Result Date: 04/20/2020 CLINICAL DATA:  Hypoactive bowel sounds. EXAM: PORTABLE ABDOMEN - 2 VIEW COMPARISON:  No prior. FINDINGS: Double-J right ureteral stent in good anatomic position. Colon is slightly distended. Cecum measures 8.9 cm in diameter.  Although these findings most likely represent colonic ileus follow-up exam suggested to exclude colonic obstruction. No small bowel distention. No free air. Bibasilar atelectasis/infiltrates. Small right pleural effusion. IMPRESSION: 1.  Double-J right  ureteral stent in good anatomic position. 2. Colon is slightly distended. Cecum measures 8.9 cm. Although these findings most likely represent colonic ileus, follow-up exam suggested to exclude colonic obstruction. No small bowel distention. No free air. 3.  Bibasilar atelectasis/infiltrates. Electronically Signed   By: Marcello Moores  Register   On: 04/20/2020 13:00   DG C-Arm 1-60 Min-No Report  Result Date: 04/15/2020 Fluoroscopy was utilized by the requesting physician.  No radiographic interpretation.   CT RENAL STONE STUDY  Result Date: 04/14/2020 CLINICAL DATA:  64 year old female with hematuria. EXAM: CT ABDOMEN AND PELVIS WITHOUT CONTRAST TECHNIQUE: Multidetector CT imaging of the abdomen and pelvis was performed following the standard protocol without IV contrast. COMPARISON:  None. FINDINGS: Evaluation of this exam is limited in the absence of intravenous contrast. Lower chest: Partially visualized moderate right and small left pleural effusions. There is associated partial compressive atelectasis of the lower lobes. Pneumonia is not excluded. Clinical correlation is recommended. There is coronary vascular calcification. Partially visualized trace pericardial effusion. There is no intra-abdominal free air. Small ascites. Hepatobiliary: No focal liver abnormality is seen. No gallstones, gallbladder wall thickening, or biliary dilatation. Pancreas: Unremarkable. No pancreatic ductal dilatation or surrounding inflammatory changes. Spleen: Normal in size without focal abnormality. Adrenals/Urinary Tract: The adrenal glands unremarkable. The left kidney is unremarkable. There is a moderate right hydronephrosis with mild right hydroureter. No stone identified. There is a transition point in the distal right ureter or right UVJ. The urinary bladder is minimally distended. There is possible mild nodular thickening of the posterior bladder wall adjacent to the right UVJ (77/2). There is loss of fat plane between  the anterior lower uterus/cervix and posterior bladder wall. The lower uterus/cervical region is somewhat enlarged. Findings concerning for a neoplastic process either arising from the bladder urothelium or from the cervix and invading into the posterior bladder wall with obstruction of the right UVJ or distal right ureter. Stomach/Bowel: There is a small hiatal hernia. There is no bowel obstruction. There is sigmoid diverticulosis. The appendix is not visualized with certainty. No inflammatory changes identified in the right lower quadrant. Vascular/Lymphatic: Moderate aortoiliac atherosclerotic disease. The IVC is unremarkable. No portal venous gas. There is no adenopathy. Reproductive: The uterus is anteverted. Enlargement of the lower uterus/cervical region as described above. Other: There is diffuse omental nodularity and caking consistent with metastatic disease. Musculoskeletal: Osteopenia. No acute osseous pathology. IMPRESSION: 1. Findings concerning for a neoplastic process either arising from the bladder urothelium or from the cervix and invading into the posterior bladder wall with obstruction of the right UVJ or distal right ureter. There is associated moderate right hydronephrosis. Sampling of the ascitic fluid may provide further diagnostic information. 2. Diffuse omental nodularity and caking consistent with metastatic disease. 3. Moderate right and small left pleural effusions with associated partial compressive atelectasis of the lower lobes. Pneumonia is not excluded. Clinical correlation is recommended. 4. Sigmoid diverticulosis. No bowel obstruction. 5. Aortic Atherosclerosis (ICD10-I70.0). Electronically Signed   By: Anner Crete M.D.   On: 04/14/2020 17:44   [place] EKG SITE RITE  Result Date: 04/18/2020 If Site Rite image not attached, placement could not be confirmed due to current cardiac rhythm.

## 2020-05-02 NOTE — Consult Note (Signed)
Eileen Burgess Admit Date: 04/13/2020 05/02/2020 Eileen Burgess Requesting Physician:  Tawanna Solo MD  Reason for Consult:  AKI HPI:  30F with 19d admit originally presenting with malaise/fatigue, PMH CAD, past T user, GERD; found to have new dx of metastatic cervical cancer with carcinomatosis (rec carboplatin / paclitaxel 9/03), complicated by severe ileus, ascites likely malignant, and during earlier portion of admission had AKI with hematuria/pyuria and R sided malignant ureteral obstruction req placement of ureteral stent.  Started TPN 8/23.    Trend in SCr below, with worsening over past 4 days. No contrast. No redcent NSAIDs.  Has had sig GI output, poor PO, on TPN.  UOP 1L 2d ago, 0.3L reported yesterday but > 1L GI output.  BPs are soft, no BP meds.  Pt with NGT, eval in process for G tube,  Rec rectal tube as well.  No recent UA or U chemistries.   S/p 3.7L paracentesis today. 8/30 CT Abd w/o concern for HN or obstruction   Creat (mg/dL)  Date Value  06/14/2016 1.03 (H)  02/27/2015 1.27 (H)   Creatinine, Ser (mg/dL)  Date Value  05/02/2020 1.89 (H)  05/01/2020 1.62 (H)  04/30/2020 1.38 (H)  04/29/2020 1.07 (H)  04/28/2020 1.05 (H)  04/27/2020 1.05 (H)  04/26/2020 1.06 (H)  04/25/2020 1.15 (H)  04/24/2020 1.29 (H)  04/23/2020 1.40 (H)  ] I/Os: I/O last 3 completed shifts: In: 2181.1 [I.V.:2181.1] Out: 2150 [Urine:700; Emesis/NG output:1450]   ROS NSAIDS: no exposures IV Contrast no exposures TMP/SMX no exposure Hypotension soft BP present Balance of 12 systems is negative w/ exceptions as above  PMH  Past Medical History:  Diagnosis Date   CAD S/P percutaneous coronary angioplasty 2005   PCI to pLAD 99%; 3.0 mm x 38 mm Cypher DES; EF ~45% - distal, septal apical hypokinesis     Dyslipidemia, goal LDL below 70    Essential hypertension    GERD (gastroesophageal reflux disease)    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 Dominant RCA is a large PDA and posterolateral branch.   CYSTOSCOPY W/ URETERAL STENT PLACEMENT Right 04/15/2020   Procedure: CYSTOSCOPY WITH RETROGRADE PYELOGRAM/URETERAL STENT PLACEMENT;  Surgeon: Raynelle Bring, MD;  Location: [facility];  Service: Urology;  Laterality: Right;   NM MYOVIEW LTD  10/2012   8:50 min; 9 METS.  No ischemia or Infarction; EF ~68%   OVARIAN CYST REMOVAL  12/1968   TRANSURETHRAL RESECTION OF BLADDER TUMOR N/A 04/15/2020   Procedure: bladder biopsy with fulguration of bleeders;  Surgeon: Raynelle Bring, MD;  Location: [facility];  Service: Urology;  Laterality: N/A;   FH  Family History  Problem Relation Age of Onset   Stroke Mother    Hypertension Mother    Hyperlipidemia Father    Cancer Maternal Grandmother        unsure type, thinks may be gyn   Hyperlipidemia Son        not known, pt assumes because of poor diet   Breast cancer Neg Hx    Colon cancer Neg Hx    SH  reports that she quit smoking about 15  years ago. She has never used smokeless tobacco. She reports that she does not drink alcohol and does not use drugs. Allergies  Allergies  Allergen Reactions   Shrimp [Shellfish Allergy] Anaphylaxis   Crestor [Rosuvastatin] Other (See Comments)    LEGS HURTING   Pravastatin Other (See Comments)    myalagia   Vytorin [Ezetimibe-Simvastatin] Other (See Comments)    myalgia   Wasp Venom Hives   Home medications Prior to Admission medications   Medication Sig Start Date End Date Taking? Authorizing Provider  acetaminophen (TYLENOL) 500 MG tablet Take 500-1,000 mg by mouth every 6 (six) hours as needed for mild pain or moderate pain.   Yes [provider]   hydrochlorothiazide (MICROZIDE) 12.5 MG capsule TAKE 1 CAPSULE BY MOUTH DAILY Patient taking differently: Take 12.5 mg by mouth daily.  03/07/20  Yes [name], MD  Ibuprofen-Acetaminophen (ADVIL DUAL ACTION) 125-250 MG TABS Take 2 tablets by mouth every 8 (eight) hours as needed (pain).   Yes [provider]  lisinopril (ZESTRIL) 20 MG tablet TAKE 1 TABLET BY MOUTH DAILY Patient taking differently: Take 20 mg by mouth daily.  03/07/20  Yes [name], MD  metoprolol succinate (TOPROL-XL) 25 MG 24 hr tablet Take 1 tablet (25 mg total) by mouth daily. 09/08/19  Yes [name], MD  nitroGLYCERIN (NITROSTAT) 0.4 MG SL tablet TAKE 1 TABLET UNDER THE TONGUE AS NEEDEDFOR CHEST PAIN UP TO 3 TIMES AN EPISODE Patient taking differently: Place 0.4 mg under the tongue every 5 (five) minutes x 3 doses as needed for chest pain.  06/16/18  Yes [name], MD  Alirocumab (PRALUENT) 150 MG/ML SOAJ Inject 150 mg into the skin every 14 (fourteen) days. Patient not taking: Reported on 04/13/2020 06/02/19   [name], MD    Current Medications Scheduled Meds:  bisacodyl  10 mg Rectal BID   Chlorhexidine Gluconate Cloth  6 each Topical Daily   feeding supplement  1 Container Oral TID BM   insulin aspart  0-9 Units Subcutaneous Q8H   ipratropium-albuterol  3 mL Nebulization Q6H   lidocaine       pantoprazole (PROTONIX) IV  40 mg Intravenous Q24H   sodium chloride flush  10-40 mL Intracatheter Q12H   sodium phosphate  1 enema Rectal Q4H   Continuous Infusions:  TPN ADULT (ION) 75 mL/hr at 05/01/20 1834   TPN ADULT (ION)     PRN Meds:.acetaminophen **OR** acetaminophen, alteplase, alum & mag hydroxide-simeth, guaiFENesin-dextromethorphan, heparin lock flush, heparin lock flush, hydrALAZINE, ipratropium-albuterol, nitroGLYCERIN, ondansetron **OR** ondansetron (ZOFRAN) IV, phenol, prochlorperazine, simethicone, sodium chloride flush, sodium chloride flush, sodium  chloride flush  CBC Recent Labs  Lab 04/28/20 0604 04/29/20 0829 05/01/20 0408  WBC 0.8* 1.3* 5.0  NEUTROABS 0.2* 0.3* 3.0  HGB 9.9* 10.0* 7.7*  HCT 31.1* 31.9* 24.2*  MCV 86.4 86.7 87.1  PLT 83* 87* 77*   Basic Metabolic Panel Recent Labs  Lab 04/26/20 0545 04/27/20 0603 04/28/20 0604 04/29/20 0803 04/30/20 0952 05/01/20 0408 05/02/20 0337  NA 137 134* 136 137 132* 137 137  K 4.6 5.2* 4.5 4.3 5.6* 4.1 3.9  CL 102 102 106 105 102 109 106  CO2 24 22 22 22  18* 20* 20*  GLUCOSE 120* 133* 135* 142* 952* 134* 129*  BUN 33* 38* 41* 47* 53* 77* 85*  CREATININE 1.06* 1.05* 1.05* 1.07* 1.38* 1.62* 1.89*  CALCIUM 8.5* 8.6* 8.3* 8.4* 8.5* 8.2* 8.3*  PHOS 3.0 3.3 3.8 3.6 7.2*  4.5 4.9*    Physical Exam  Blood pressure 104/66, pulse 96, temperature 97.7 F (36.5 C), temperature source Oral, resp. rate 18, height 5\' 4"  (1.626 m), weight 105 kg, SpO2 91 %. GEN: obese, chronically ill appearing, appears uncomfortable ENT: NCAT EYES: EOMI CV: RRR nl s1s2 PULM: Coarse bs b/l ABD: quiet, soft SKIN: no rashes  Assessment 64Frecent dx of metastatic adenoCA of cervix with ureteral obstruction s/p stenting, likley malignant ascites with carcinomatosis with oliguric AKI.  I suspect etiology is ineffective arterial flow with her low BPs, tense ascites, and poor PO and intravascular beign 'dry', low oncotic pressure; further could have progressed to ATN.  1. AKI, nl BL, likely multifactorial inpaired renal perfusion, maybe now with ATN; doubt abd compartment syndrome.   2. 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 Ascites, as above, s/p LVP 8/31 4. Anemia / pancytopenia, from CTX, Onc following 5. Acidosis, metabolic 6. Ileus, severe, potentially for G tube  Plan 1. Check UA, UNa 2. If UNa < 20 would give IVFs / albumin to support intravascular volume 3. Cont supportive care, 4. Daily weights,  Daily Renal Panel, Strict I/Os, Avoid nephrotoxins (NSAIDs, judicious IV Contrast)    Eileen Burgess  \05/02/2020, 2:53 PM

## 2020-05-02 NOTE — Progress Notes (Signed)
Patient ID: CONTINA STRAIN, female   DOB: [birth year], 64 y.o.   MRN: [id number] Request received for consideration of percutaneous gastrostomy tube placement in patient.  Latest imaging studies have been reviewed by Dr. Annamaria Boots.  CT abdomen yesterday revealed: 1. Despite amenable gastric anatomy, the presence moderate volume intra-abdominal ascites renders the patient a poor candidate for gastrostomy tube placement. 2. Suspected ileus, incompletely evaluated. 3. Moderate sized bilateral pleural effusions with associated bibasilar atelectasis, right greater than left. 4. Small pericardial effusion. Further evaluation cardiac echo could be performed as indicated. 5.  Aortic Atherosclerosis (ICD10-I70.0). 6. Post right-sided ureteral stent placement with mild asymmetric atrophy of the right kidney comparison to left. No evidence of urinary obstruction.  Recommend discussion with GI or surgical teams if G tube still desired

## 2020-05-02 NOTE — Plan of Care (Signed)
  Problem: Education: Goal: Knowledge of General Education information will improve Description: Including pain rating scale, medication(s)/side effects and non-pharmacologic comfort measures Outcome: Progressing   Problem: Pain Managment: Goal: General experience of comfort will improve Outcome: Progressing   

## 2020-05-02 NOTE — Progress Notes (Addendum)
PROGRESS NOTE    AUDI CONOVER  TIR:[id number] DOB: Jan 23, 1956 DOA: 04/13/2020 PCP: Patient, No Pcp Per   Brief Narrative:  Ms. Bolser is a 64 y.o.female with PMH of CAD s/p angioplasty to the LAD, hypertension, history of tobacco abuse but  currently non-smoking, GERD that came to the hospital due to 3 weeks of episodic diarrhea that had progressively gotten worse, she has not received any antibiotics over the last year, she was also  nauseated with loss of appetite and had decreased oral intake . She underwent a CT renal stone protocol which revealed an underlying malignancy involving the cervix and invading into the posterior bladder wall with obstruction of the right UVJ or distal right ureter with right hydronephrosis.  Urology was consulted and she has since undergone a ureteral stent on the right side due to ureteral obstruction.   She was also evaluated by oncology and underwent cervical biopsy.  Frozen section and formal pathology are both consistent with adenocarcinoma.  She underwent PICC line placement on 04/19/2020.  On 8/19, she underwent first dose of chemo and tolerated well. Later that night she developed respiratory distress due to volume overload from IVF and a CXR showed vascular congestion. IVF were discontinued and she was started on lasix.  She continued to have abdominal findings concerning for ileus. Serial abdominal xrays showed ongoing colonic gaseous distension.  Due to prolonged ileus and no advancement of her diet, she was started on TPN.  Oncology closely following. She developed nausea and vomiting on 04/27/20 so had to be put on NG tube.  Hospital course remarkable for persistent ileus requiring NG tube placement.  GI also consulted . General surgery consulted for G-tube placement for persistent ileus.    Assessment & Plan:   Principal Problem:   Cervical cancer ([place]) Active Problems:   Essential hypertension   UTI (urinary tract infection)   AKI (acute  kidney injury) (HCC)   Hyponatremia   Orthostatic hypotension   Hyponatremia syndrome  Abdominal pain with dysuria, AKI, right hydronephrosis  Metastatic omental carcinomatosis.Cervical cancer-new diagnosis Bowel obstruction vs ileus -CT abdomen pelvis showed possible neoplastic processes arising from the bladder urothelium or from cervix and invading into the posterior bladder wall with obstruction of right UVJ or distal right ureter, moderate right hydronephrosis, diffuse omental nodularity and caking consistent with metastatic disease.No prior history of malignancy (just had a history of ovarian cyst at the age of 5 years). -Urology consulted,s/pright ureteral stent placementon 8/14. Biopsy of the posterior bladder with intraoperative findings suggest extrinsic mass invading bladder rather than primary bladder malignancy.  -CT chest with contrast showed small pleural effusion left, moderate sized right pleural effusion, moderate amount of abdominal free fluid, moderate to marked severe right-sided hydronephrosis. No lung lesions. - frozen section consistent with adenocarcinoma; final path also shows adenocarcinoma from cervical biopsy -Received infusion of carboplatin/paclitaxel on 04/20/2020; PICC line placed 04/19/20 -She continued to have abdominal findings concerning for ileus however was not very symptomatic. Serial abdominal xrays showed ongoing colonic gaseous distension.  Due to prolonged ileus and no advancement of her diet, she was started on TPN -On 8/26,she developed nausea and vomiting  so had to be put on NG tube.  Serial abdominal x-rays showed persistent ileus so GI consulted -General surgery for possible G-tube placement. -She underwent ultrasound-guided paracentesis with removal of 3.7 L of fluid.  Follow-up cytology.  Cough/shortness of breath: Likely secondary to IV fluids resuscitation.  Chest x-ray showed bilateral pleural effusion, atelectasis, interstitial  edema.  We have discontinued IV fluids.  She was given few  doses of IV Lasix .  We have requested for ultrasound-guided thoracentesis of right-sided moderate pleural effusion.  Leukopenia/thrombocytopenia: Most likely associated  with malignancy, chemotherapy.  Continue to monitor CBC.  [place] Heparin discontinued due to thrombocytopenia.  Hyperkalemia: Resolved  AKI on CKDIIIa - baseline creat appears to be 1.1, but GFR in 2018 mid 50s -Continue to hold lisinopril, HCTZ -Status post ureteral stent -Creatinine trended up with 1.89 today. It could be from intravascular volume depletion but she appeared volume overloaded at times and had to be given Lasix. AKI could also have been contributed due to increased intra-abdominal pressure from ascites/malignancy.  -We have consulted nephrology today because of worsening kidney function. Checking urine sodium.  Hypertension -BP soft but stable -held metoprolol, avoid diuretics/ ACE inhibitor  CAD status post PTCA On metoprolol, aspirin at home  GERD Continue PPI  Obesity BMI of 37.  Goals of care: Prolonged hospital course due to persistent ileus.  She has extensive intra-abdominal/pelvic malignancy.  Remains full code.  Received chemotherapy for malignancy.  Too early to discuss about palliative approach/hospice  Nutrition Problem: Increased nutrient needs Etiology: cancer and cancer related treatments      DVT prophylaxis: Heparin [place] Code Status: Full code Family Communication:Discussed with son on phone on 05/02/20 Status is: Inpatient  Remains inpatient appropriate because:IV treatments appropriate due to intensity of illness or inability to take PO   Dispo: The patient is from: Home              Anticipated d/c is to: Home              Anticipated d/c date is: > 3 days              Patient currently is not medically stable to d/c.  On TPN. On NG tube.  General surgery consulted for possible G-tube  placement.   Consultants: Oncology  Procedures: Ureteral stent placement, cervical biopsy  Antimicrobials:  Anti-infectives (From admission, onward)   Start     Dose/Rate Route Frequency Ordered Stop   04/15/20 1547  sodium chloride 0.9 % with cefTRIAXone (ROCEPHIN) ADS Med       Note to Pharmacy: Nunzio Cobbs : cabinet override      04/15/20 1547 04/16/20 0359   04/13/20 1700  cefTRIAXone (ROCEPHIN) 1 g in sodium chloride 0.9 % 100 mL IVPB        1 g 200 mL/hr over 30 Minutes Intravenous Every 24 hours 04/13/20 1635 04/17/20 1659      Subjective:  Patient seen and examined the bedside this morning.  She came back from paracentesis.  Her abdomen is significantly less distended after the paracentesis and she feels better.  Denies any abdomen pain.  She still has some congestion and cough.  Remains on NG tube.  Objective: Vitals:   05/01/20 1913 05/01/20 2108 05/01/20 2313 05/02/20 0606  BP:  (!) 109/97  (!) 99/58  Pulse:  96  92  Resp:  (!) 24  20  Temp:  98.5 F (36.9 C)  98.3 F (36.8 C)  TempSrc:  Oral  Oral  SpO2: 93% 93% 91% 91%  Weight:      Height:        Intake/Output Summary (Last 24 hours) at 05/02/2020 0809 Last data filed at 05/02/2020 0600 Gross per 24 hour  Intake 1524.2 ml  Output 1450 ml  Net 74.2 ml   [employer]  04/13/20 1234 04/24/20 0830 05/01/20 0838  Weight: 98 kg 98.2 kg 105 kg    Examination:  General exam: Generalized weakness, lying on the bed HEENT: NG tube Respiratory system: Bilateral diminished air sounds, no crackles or wheezes  cardiovascular system: S1 & S2 heard, RRR. No JVD, murmurs, rubs, gallops or clicks. Gastrointestinal system: Abdomen is distended, soft and nontender. No organomegaly or masses felt. very slow  bowel sounds heard. Central nervous system: Alert and oriented. No focal neurological deficits. Extremities: No edema, no clubbing ,no cyanosis. Skin: No rashes, lesions or ulcers,no icterus ,no  pallor   Data Reviewed: I have personally reviewed following labs and imaging studies  CBC: Recent Labs  Lab 04/26/20 0545 04/27/20 0603 04/28/20 0604 04/29/20 0829 05/01/20 0408  WBC 1.4* 1.2* 0.8* 1.3* 5.0  NEUTROABS 0.9* 0.7* 0.2* 0.3* 3.0  HGB 10.9* 11.4* 9.9* 10.0* 7.7*  HCT 33.2* 37.3 31.1* 31.9* 24.2*  MCV 85.8 89.2 86.4 86.7 87.1  PLT 98* 89* 83* 87* 77*   Basic Metabolic Panel: Recent Labs  Lab 04/28/20 0604 04/29/20 0803 04/30/20 0952 05/01/20 0408 05/02/20 0337  NA 136 137 132* 137 137  K 4.5 4.3 5.6* 4.1 3.9  CL 106 105 102 109 106  CO2 22 22 18* 20* 20*  GLUCOSE 135* 142* 952* 134* 129*  BUN 41* 47* 53* 77* 85*  CREATININE 1.05* 1.07* 1.38* 1.62* 1.89*  CALCIUM 8.3* 8.4* 8.5* 8.2* 8.3*  MG 2.0 2.0 2.4 2.1 2.2  PHOS 3.8 3.6 7.2* 4.5 4.9*   GFR: Estimated Creatinine Clearance: 34.6 mL/min (A) (by C-G formula based on SCr of 1.89 mg/dL (H)). Liver Function Tests: Recent Labs  Lab 04/26/20 0545 04/27/20 0603 04/28/20 0604 05/01/20 0408 05/02/20 0337  AST 21 24 23  39 31  ALT 13 12 13 25 28   ALKPHOS 35* 42 47 70 70  BILITOT 0.4 0.5 0.5 0.4 0.5  PROT 5.3* 5.3* 5.3* 5.0* 5.0*  ALBUMIN 2.4* 2.5* 2.3* 2.0* 1.9*   No results for input(s): LIPASE, AMYLASE in the last 168 hours. No results for input(s): AMMONIA in the last 168 hours. Coagulation Profile: No results for input(s): INR, PROTIME in the last 168 hours. Cardiac Enzymes: No results for input(s): CKTOTAL, CKMB, CKMBINDEX, TROPONINI in the last 168 hours. BNP (last 3 results) No results for input(s): PROBNP in the last 8760 hours. HbA1C: No results for input(s): HGBA1C in the last 72 hours. CBG: Recent Labs  Lab 04/30/20 1557 04/30/20 2326 05/01/20 0725 05/01/20 1625 05/02/20 0013  GLUCAP 131* 141* 150* 130* 131*   Lipid Profile: Recent Labs    05/01/20 0408  TRIG 141   Thyroid Function Tests: No results for input(s): TSH, T4TOTAL, FREET4, T3FREE, THYROIDAB in the last 72  hours. Anemia Panel: No results for input(s): VITAMINB12, FOLATE, FERRITIN, TIBC, IRON, RETICCTPCT in the last 72 hours. Sepsis Labs: No results for input(s): PROCALCITON, LATICACIDVEN in the last 168 hours.  No results found for this or any previous visit (from the past 240 hour(s)).       Radiology Studies: DG Chest 1 View  Result Date: 04/30/2020 CLINICAL DATA:  Shortness of breath and cough. EXAM: CHEST  1 VIEW COMPARISON:  Chest radiograph dated 04/28/2020. FINDINGS: The heart size and mediastinal contours are within normal limits. Opacity of the right hemithorax likely reflects a layering pleural effusion with associated atelectasis. A small left pleural effusion with associated atelectasis is noted. Mild-to-moderate bibasilar airspace opacities likely contribute. Mild diffuse bilateral interstitial opacities are noted. There  is no pneumothorax. A right upper extremity peripherally inserted central venous catheter tip overlies the superior vena cava. An enteric tube enters the stomach and terminates below the field of view. IMPRESSION: Bilateral pleural effusions with associated atelectasis and/or airspace opacities. Mild diffuse bilateral interstitial opacities may represent pulmonary edema or infection. Electronically Signed   By: Zerita Boers M.D.   On: 04/30/2020 15:04   DG Abd 1 View  Result Date: 05/01/2020 CLINICAL DATA:  Follow-up ileus EXAM: ABDOMEN - 1 VIEW COMPARISON:  04/29/2020 abdominal radiograph FINDINGS: Enteric tube terminates in the descending duodenum. Stable right nephroureteral stent. Mildly dilated small bowel loops in right abdomen appears slightly increased. Moderate gaseous distention of the colon appears similar. No evidence of pneumatosis or pneumoperitoneum. No radiopaque nephrolithiasis. IMPRESSION: 1. Enteric tube terminates in the descending duodenum. 2. Mildly dilated small bowel loops in right abdomen appear slightly increased. Stable moderate gaseous  distention of the colon. Findings are compatible with persistent adynamic ileus. Electronically Signed   By: Ilona Sorrel M.D.   On: 05/01/2020 10:05        Scheduled Meds: . bisacodyl  10 mg Rectal BID  . Chlorhexidine Gluconate Cloth  6 each Topical Daily  . feeding supplement  1 Container Oral TID BM  . insulin aspart  0-9 Units Subcutaneous Q8H  . ipratropium-albuterol  3 mL Nebulization Q6H  . metoprolol tartrate  2.5 mg Intravenous Q6H  . pantoprazole (PROTONIX) IV  40 mg Intravenous Q24H  . sodium chloride flush  10-40 mL Intracatheter Q12H   Continuous Infusions: . TPN ADULT (ION) 75 mL/hr at 05/01/20 1834  . TPN ADULT (ION)       LOS: 18 days    Time spent: 35 mins,More than 50% of that time was spent in counseling and/or coordination of care.      Shelly Coss, MD [facility] P8/31/2021, 8:09 AM

## 2020-05-02 NOTE — Consult Note (Signed)
[facility] Surgery Consult Note  Eileen Burgess 29-May-1956  [id number].    Requesting MD: Tawanna Solo Chief Complaint/Reason for Consult: malignant SBO HPI:  Patient is a 64 year old female admitted 04/13/20 for 3 weeks of episodic diarrhea, decreased PO intake, dysuria, malaise and fatigue. CT done 8/13 showed a pelvic mass that was concerning for malignancy with evidence of possible metastatic disease. Ureteral stent was placed for R sided obstruction. GYN-ONC consulted 8/16 and felt that based on patient exam that patient likely had advanced cervical cancer. Biopsies were taken of bladder and cervix and both came back as adenocarcinoma. CA 125 also found to be elevated. Medical oncology has been following and patient has received one cycle carboplatin and paclitaxel 8/19. Developed obstructive symptoms and had NGT placed for this. Patient also developed significant ascites - IR consulted for paracentesis and G-tube placement. IR felt that patient not a candidate for percutaneous gastrostomy placement. We are asked to evaluated for palliative gastrostomy.   PMH prior to admission significant for HTN, HLD, Hx of MI s/p angioplasty, and insomnia. Past abdominal surgery includes cesarean section, appendectomy and removal of an ovarian cyst. She is not on any blood thinning medications. Patient with anaphylaxis to shellfish, intolerant of statins.   ROS: Review of Systems  Constitutional: Positive for malaise/fatigue and weight loss.  Respiratory: Negative for shortness of breath and wheezing.   Cardiovascular: Negative for chest pain and palpitations.  Gastrointestinal: Positive for constipation, nausea and vomiting. Negative for abdominal pain and diarrhea.  All other systems reviewed and are negative.   Family History  Problem Relation Age of Onset  . Stroke Mother   . Hypertension Mother   . Hyperlipidemia Father   . Cancer Maternal Grandmother        unsure type, thinks may be gyn   . Hyperlipidemia Son        not known, pt assumes because of poor diet  . Breast cancer Neg Hx   . Colon cancer Neg Hx     Past Medical History:  Diagnosis Date  . CAD S/P percutaneous coronary angioplasty 2005   PCI to pLAD 99%; 3.0 mm x 38 mm Cypher DES; EF ~45% - distal, septal apical hypokinesis    . Dyslipidemia, goal LDL below 70   . Essential hypertension   . GERD (gastroesophageal reflux disease)   . 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 Insomnia     Past Surgical History:  Procedure Laterality Date  . APPENDECTOMY  12/1968   at same time as her ovarian cyst surgery  . CESAREAN SECTION  05/18/1983  . CORONARY ANGIOPLASTY WITH STENT PLACEMENT  2005   prox LAD 3.0 mm x 28 mm Cypher DES; circumflex is nondominant with 2 large EOMs. Dominant RCA is a large PDA and posterolateral branch.  . CYSTOSCOPY W/ URETERAL STENT PLACEMENT Right 04/15/2020   Procedure: CYSTOSCOPY WITH RETROGRADE PYELOGRAM/URETERAL STENT PLACEMENT;  Surgeon: Raynelle Bring, MD;  Location: [facility];  Service: Urology;  Laterality: Right;  . NM MYOVIEW LTD  10/2012   8:50 min; 9 METS.  No ischemia or Infarction; EF ~68%  . OVARIAN CYST REMOVAL  12/1968  . TRANSURETHRAL RESECTION OF BLADDER TUMOR N/A 04/15/2020   Procedure: bladder biopsy with fulguration of bleeders;  Surgeon: Raynelle Bring, MD;  Location: [facility];  Service: Urology;  Laterality: N/A;    Social History:  reports that she quit smoking about 15 years ago. She has never used smokeless tobacco. She reports that  she does not drink alcohol and does not use drugs.  Allergies:  Allergies  Allergen Reactions  . Shrimp [Shellfish Allergy] Anaphylaxis  . Crestor [Rosuvastatin] Other (See Comments)    LEGS HURTING  . Pravastatin Other (See Comments)    myalagia  . Vytorin [Ezetimibe-Simvastatin] Other (See Comments)    myalgia  . Wasp Venom Hives    Medications Prior to Admission  Medication Sig Dispense Refill  . acetaminophen  (TYLENOL) 500 MG tablet Take 500-1,000 mg by mouth every 6 (six) hours as needed for mild pain or moderate pain.    . hydrochlorothiazide (MICROZIDE) 12.5 MG capsule TAKE 1 CAPSULE BY MOUTH DAILY (Patient taking differently: Take 12.5 mg by mouth daily. ) 90 capsule 1  . Ibuprofen-Acetaminophen (ADVIL DUAL ACTION) 125-250 MG TABS Take 2 tablets by mouth every 8 (eight) hours as needed (pain).    Marland Kitchen lisinopril (ZESTRIL) 20 MG tablet TAKE 1 TABLET BY MOUTH DAILY (Patient taking differently: Take 20 mg by mouth daily. ) 90 tablet 1  . metoprolol succinate (TOPROL-XL) 25 MG 24 hr tablet Take 1 tablet (25 mg total) by mouth daily. 90 tablet 3  . nitroGLYCERIN (NITROSTAT) 0.4 MG SL tablet TAKE 1 TABLET UNDER THE TONGUE AS NEEDEDFOR CHEST PAIN UP TO 3 TIMES AN EPISODE (Patient taking differently: Place 0.4 mg under the tongue every 5 (five) minutes x 3 doses as needed for chest pain. ) 25 tablet 3  . Alirocumab (PRALUENT) 150 MG/ML SOAJ Inject 150 mg into the skin every 14 (fourteen) days. (Patient not taking: Reported on 04/13/2020) 2 pen 11    Blood pressure (!) 101/46, pulse 95, temperature 98.1 F (36.7 C), temperature source Axillary, resp. rate 20, height 5\' 4"  (1.626 m), weight 105 kg, SpO2 91 %. Physical Exam:  General: pleasant, WD, obese female who is laying in bed  HEENT: Sclera are noninjected.  PERRL.  Ears and nose without any masses or lesions.  Mouth is pink and moist Heart: regular, rate, and rhythm. Palpable radial and pedal pulses bilaterally Lungs: CTAB, no wheezes, rhonchi, or rales noted.  Respiratory effort nonlabored Abd: soft, NT, moderately distended, BS hypoactive, NGT in place with bilious drainage MS: all 4 extremities are symmetrical with no cyanosis, clubbing, or edema. Skin: warm and dry with no masses, lesions, or rashes Neuro: Cranial nerves 2-12 grossly intact, sensation grossly intact Psych: A&Ox3, seems overwhelmed   Results for orders placed or performed during the  hospital encounter of 04/13/20 (from the past 48 hour(s))  Glucose, capillary     Status: Abnormal   Collection Time: 04/30/20  3:57 PM  Result Value Ref Range   Glucose-Capillary 131 (H) 70 - 99 mg/dL    Comment: Glucose reference range applies only to samples taken after fasting for at least 8 hours.  Glucose, capillary     Status: Abnormal   Collection Time: 04/30/20 11:26 PM  Result Value Ref Range   Glucose-Capillary 141 (H) 70 - 99 mg/dL    Comment: Glucose reference range applies only to samples taken after fasting for at least 8 hours.  Comprehensive metabolic panel     Status: Abnormal   Collection Time: 05/01/20  4:08 AM  Result Value Ref Range   Sodium 137 135 - 145 mmol/L   Potassium 4.1 3.5 - 5.1 mmol/L    Comment: DELTA CHECK NOTED   Chloride 109 98 - 111 mmol/L   CO2 20 (L) 22 - 32 mmol/L   Glucose, Bld 134 (H) 70 - 99  mg/dL    Comment: Glucose reference range applies only to samples taken after fasting for at least 8 hours.   BUN 77 (H) 8 - 23 mg/dL   Creatinine, Ser 1.62 (H) 0.44 - 1.00 mg/dL   Calcium 8.2 (L) 8.9 - 10.3 mg/dL   Total Protein 5.0 (L) 6.5 - 8.1 g/dL   Albumin 2.0 (L) 3.5 - 5.0 g/dL   AST 39 15 - 41 U/L   ALT 25 0 - 44 U/L   Alkaline Phosphatase 70 38 - 126 U/L   Total Bilirubin 0.4 0.3 - 1.2 mg/dL   GFR calc non Af Amer 33 (L) >60 mL/min   GFR calc Af Amer 38 (L) >60 mL/min   Anion gap 8 5 - 15    Comment: Performed at [facility], [place] [address], [place], [place] [address]  Magnesium     Status: None   Collection Time: 05/01/20  4:08 AM  Result Value Ref Range   Magnesium 2.1 1.7 - 2.4 mg/dL    Comment: Performed at [facility], [place] [address], [place], [place] [address]  Phosphorus     Status: None   Collection Time: 05/01/20  4:08 AM  Result Value Ref Range   Phosphorus 4.5 2.5 - 4.6 mg/dL    Comment: Performed at [facility], [place] [address] [place], [place] [address]  CBC      Status: Abnormal   Collection Time: 05/01/20  4:08 AM  Result Value Ref Range   WBC 5.0 4.0 - 10.5 K/uL   RBC 2.78 (L) 3.87 - 5.11 MIL/uL   Hemoglobin 7.7 (L) 12.0 - 15.0 g/dL   HCT 24.2 (L) 36 - 46 %   MCV 87.1 80.0 - 100.0 fL   MCH 27.7 26.0 - 34.0 pg   MCHC 31.8 30.0 - 36.0 g/dL   RDW 14.6 11.5 - 15.5 %   Platelets 77 (L) 150 - 400 K/uL    Comment: REPEATED TO VERIFY PLATELET COUNT CONFIRMED BY SMEAR Immature Platelet Fraction may be clinically indicated, consider ordering this additional test [id number] CONSISTENT WITH PREVIOUS RESULT    nRBC 0.6 (H) 0.0 - 0.2 %    Comment: Performed at [facility], [place] [address] [place], [place] [address]  Differential     Status: Abnormal   Collection Time: 05/01/20  4:08 AM  Result Value Ref Range   Neutrophils Relative % 60 %   Neutro Abs 3.0 1.7 - 7.7 K/uL   Lymphocytes Relative 14 %   Lymphs Abs 0.7 0.7 - 4.0 K/uL   Monocytes Relative 18 %   Monocytes Absolute 0.9 0 - 1 K/uL   Eosinophils Relative 3 %   Eosinophils Absolute 0.1 0 - 0 K/uL   Basophils Relative 0 %   Basophils Absolute 0.0 0 - 0 K/uL   Immature Granulocytes 5 %   Abs Immature Granulocytes 0.25 (H) 0.00 - 0.07 K/uL    Comment: Performed at [facility], [place] [address], [place], [place] [address]  Triglycerides     Status: None   Collection Time: 05/01/20  4:08 AM  Result Value Ref Range   Triglycerides 141 <150 mg/dL    Comment: Performed at [facility], [place] [address], [place], [place] [address]  Prealbumin     Status: Abnormal   Collection Time: 05/01/20  4:08 AM  Result Value Ref Range   Prealbumin 8.8 (L) 18 - 38 mg/dL  Comment: Performed at [facility], [place] [address], [place], [place] [address]  Glucose, capillary     Status: Abnormal   Collection Time: 05/01/20  7:25 AM  Result Value Ref Range   Glucose-Capillary 150 (H) 70 - 99 mg/dL    Comment: Glucose reference range  applies only to samples taken after fasting for at least 8 hours.  Glucose, capillary     Status: Abnormal   Collection Time: 05/01/20  4:25 PM  Result Value Ref Range   Glucose-Capillary 130 (H) 70 - 99 mg/dL    Comment: Glucose reference range applies only to samples taken after fasting for at least 8 hours.  Glucose, capillary     Status: Abnormal   Collection Time: 05/02/20 12:13 AM  Result Value Ref Range   Glucose-Capillary 131 (H) 70 - 99 mg/dL    Comment: Glucose reference range applies only to samples taken after fasting for at least 8 hours.  Comprehensive metabolic panel     Status: Abnormal   Collection Time: 05/02/20  3:37 AM  Result Value Ref Range   Sodium 137 135 - 145 mmol/L   Potassium 3.9 3.5 - 5.1 mmol/L   Chloride 106 98 - 111 mmol/L   CO2 20 (L) 22 - 32 mmol/L   Glucose, Bld 129 (H) 70 - 99 mg/dL    Comment: Glucose reference range applies only to samples taken after fasting for at least 8 hours.   BUN 85 (H) 8 - 23 mg/dL    Comment: RESULTS CONFIRMED BY MANUAL DILUTION   Creatinine, Ser 1.89 (H) 0.44 - 1.00 mg/dL   Calcium 8.3 (L) 8.9 - 10.3 mg/dL   Total Protein 5.0 (L) 6.5 - 8.1 g/dL   Albumin 1.9 (L) 3.5 - 5.0 g/dL   AST 31 15 - 41 U/L   ALT 28 0 - 44 U/L   Alkaline Phosphatase 70 38 - 126 U/L   Total Bilirubin 0.5 0.3 - 1.2 mg/dL   GFR calc non Af Amer 27 (L) >60 mL/min   GFR calc Af Amer 31 (L) >60 mL/min   Anion gap 11 5 - 15    Comment: Performed at [facility], [place] [address] [place], [place] [address]  Magnesium     Status: None   Collection Time: 05/02/20  3:37 AM  Result Value Ref Range   Magnesium 2.2 1.7 - 2.4 mg/dL    Comment: Performed at [facility], [place] [address] [place], [place] [address]  Phosphorus     Status: Abnormal   Collection Time: 05/02/20  3:37 AM  Result Value Ref Range   Phosphorus 4.9 (H) 2.5 - 4.6 mg/dL    Comment: Performed at [facility], [place] [address] [place], [place] [address]  Glucose, capillary     Status: Abnormal   Collection Time: 05/02/20  8:34 AM  Result Value Ref Range   Glucose-Capillary 130 (H) 70 - 99 mg/dL    Comment: Glucose reference range applies only to samples taken after fasting for at least 8 hours.   CT ABDOMEN WO CONTRAST  Result Date: 05/02/2020 CLINICAL DATA:  Evaluate anatomy prior to potential percutaneous gastrostomy tube placement. EXAM: CT ABDOMEN WITHOUT CONTRAST TECHNIQUE: Multidetector CT imaging of the abdomen was performed following the standard protocol without IV contrast. COMPARISON:  CT abdomen pelvis - 04/14/2020; abdominal radiograph-05/01/2020; 04/29/2020; 04/27/2020 FINDINGS: Lack of intravenous contrast limits the ability to evaluate solid abdominal organs. Lower chest: Limited visualization  of the lower thorax demonstrates small to moderate-sized bilateral pleural effusions with associated bibasilar consolidative opacities, right greater than left. Borderline cardiomegaly.  Small pericardial effusion. Hepatobiliary: There is mild nodularity of the hepatic contour suggestive of cirrhotic change. Normal noncontrast appearance of the gallbladder given degree distention. No radiopaque gallstones. Moderate volume intra-abdominal ascites. Pancreas: Normal noncontrast appearance of the pancreas. Spleen: Normal noncontrast appearance of the spleen. Adrenals/Urinary Tract: Post right-sided ureteral stent placement. The right kidney is slightly atrophic in comparison to the left. No renal stones. There is a minimal amount likely age and body habitus related bilateral perinephric stranding. No urine obstruction. Normal noncontrast appearance the bilateral adrenal glands. The urinary bladder was not imaged. Stomach/Bowel: The anterior wall of the gastric antrum is well apposed against the ventral wall of the upper abdomen without interposition of the hepatic parenchyma or transverse colon. Enteric tube tip terminates  within the descending portion of the duodenum. There is moderate distension majority of the visualized loops of large and small bowel, similar to abdominal radiographs, incompletely evaluated though potentially the sequela of ileus. No pneumoperitoneum, pneumatosis or portal venous gas. Vascular/Lymphatic: Moderate amount of atherosclerotic plaque within a normal caliber abdominal aorta. No bulky retroperitoneal or mesenteric adenopathy on this noncontrast examination. Other: Moderate to large amount of diffuse body wall anasarca, most conspicuous about the midline of the low back and bilateral flanks. Musculoskeletal: No acute or aggressive osseous abnormalities. Stigmata of dish within the lower thoracic spine. IMPRESSION: 1. Despite amenable gastric anatomy, the presence moderate volume intra-abdominal ascites renders the patient a poor candidate for gastrostomy tube placement. 2. Suspected ileus, incompletely evaluated. 3. Moderate sized bilateral pleural effusions with associated bibasilar atelectasis, right greater than left. 4. Small pericardial effusion. Further evaluation cardiac echo could be performed as indicated. 5.  Aortic Atherosclerosis (ICD10-I70.0). 6. Post right-sided ureteral stent placement with mild asymmetric atrophy of the right kidney comparison to left. No evidence of urinary obstruction. Electronically Signed   By: Sandi Mariscal M.D.   On: 05/02/2020 08:14   DG Abd 1 View  Result Date: 05/01/2020 CLINICAL DATA:  Follow-up ileus EXAM: ABDOMEN - 1 VIEW COMPARISON:  04/29/2020 abdominal radiograph FINDINGS: Enteric tube terminates in the descending duodenum. Stable right nephroureteral stent. Mildly dilated small bowel loops in right abdomen appears slightly increased. Moderate gaseous distention of the colon appears similar. No evidence of pneumatosis or pneumoperitoneum. No radiopaque nephrolithiasis. IMPRESSION: 1. Enteric tube terminates in the descending duodenum. 2. Mildly dilated  small bowel loops in right abdomen appear slightly increased. Stable moderate gaseous distention of the colon. Findings are compatible with persistent adynamic ileus. Electronically Signed   By: Ilona Sorrel M.D.   On: 05/01/2020 10:05   US Paracentesis  Result Date: 05/02/2020 INDICATION: Patient with history of stage IV cervical cancer/adenocarcinoma, carcinomatosis, ascites. Request made for diagnostic and therapeutic paracentesis. EXAM: ULTRASOUND GUIDED DIAGNOSTIC AND THERAPEUTIC PARACENTESIS MEDICATIONS: 1% lidocaine to skin and subcutaneous tissue COMPLICATIONS: None immediate. PROCEDURE: Informed written consent was obtained from the patient after a discussion of the risks, benefits and alternatives to treatment. A timeout was performed prior to the initiation of the procedure. Initial ultrasound scanning demonstrates a large amount of ascites within the right lower abdominal quadrant. The right lower abdomen was prepped and draped in the usual sterile fashion. 1% lidocaine was used for local anesthesia. Following this, a 19 gauge, 10-cm, Yueh catheter was introduced. An ultrasound image was saved for documentation purposes. The paracentesis was performed. The catheter was removed and a  dressing was applied. The patient tolerated the procedure well without immediate post procedural complication. FINDINGS: A total of approximately 3.7 liters of slightly hazy, amber fluid was removed. Samples were sent to the laboratory as requested by the clinical team. IMPRESSION: Successful ultrasound-guided diagnostic and therapeutic paracentesis yielding 3.7 liters of peritoneal fluid. Read by: Rowe Robert, PA-C Electronically Signed   By: Jerilynn Mages.  Shick M.D.   On: 05/02/2020 11:15      Assessment/Plan Pancytopenia secondary to chemotherapy AKI secondary to hydronephrosis Protein calorie malnutrition  HTN CAD   Stage IV cervical cancer with carcinomatosis Ascites - s/p paracentesis today Malignant SBO -  consulted for palliative gastrostomy placement - could consider attempt at PEG vs laparoscopic g-tube for relief of obstructive symptoms - patient understands that procedure would be purely palliative - she is not ready to make a decision about this today and would like some time to consider, we will follow up later this week   Norm Parcel, [facility] Surgery 05/02/2020, 12:14 PM Please see Amion for pager number during day hours 7:00am-4:30pm

## 2020-05-03 ENCOUNTER — Inpatient Hospital Stay (HOSPITAL_COMMUNITY): Payer: Medicare Other

## 2020-05-03 DIAGNOSIS — I951 Orthostatic hypotension: Secondary | ICD-10-CM

## 2020-05-03 DIAGNOSIS — K56691 Other complete intestinal obstruction: Secondary | ICD-10-CM

## 2020-05-03 LAB — BODY FLUID CELL COUNT WITH DIFFERENTIAL
Eos, Fluid: 0 %
Lymphs, Fluid: 62 %
Monocyte-Macrophage-Serous Fluid: 33 % — ABNORMAL LOW (ref 50–90)
Neutrophil Count, Fluid: 5 % (ref 0–25)
Total Nucleated Cell Count, Fluid: 164 cu mm (ref 0–1000)

## 2020-05-03 LAB — COMPREHENSIVE METABOLIC PANEL
ALT: 25 U/L (ref 0–44)
AST: 25 U/L (ref 15–41)
Albumin: 1.7 g/dL — ABNORMAL LOW (ref 3.5–5.0)
Alkaline Phosphatase: 65 U/L (ref 38–126)
Anion gap: 9 (ref 5–15)
BUN: 100 mg/dL — ABNORMAL HIGH (ref 8–23)
CO2: 22 mmol/L (ref 22–32)
Calcium: 8.1 mg/dL — ABNORMAL LOW (ref 8.9–10.3)
Chloride: 108 mmol/L (ref 98–111)
Creatinine, Ser: 1.92 mg/dL — ABNORMAL HIGH (ref 0.44–1.00)
GFR calc Af Amer: 31 mL/min — ABNORMAL LOW (ref >60)
GFR calc non Af Amer: 27 mL/min — ABNORMAL LOW (ref >60)
Glucose, Bld: 135 mg/dL — ABNORMAL HIGH (ref 70–99)
Potassium: 3.8 mmol/L (ref 3.5–5.1)
Sodium: 139 mmol/L (ref 135–145)
Total Bilirubin: 0.7 mg/dL (ref 0.3–1.2)
Total Protein: 4.9 g/dL — ABNORMAL LOW (ref 6.5–8.1)

## 2020-05-03 LAB — GLUCOSE, CAPILLARY
Glucose-Capillary: 120 mg/dL — ABNORMAL HIGH (ref 70–99)
Glucose-Capillary: 124 mg/dL — ABNORMAL HIGH (ref 70–99)
Glucose-Capillary: 125 mg/dL — ABNORMAL HIGH (ref 70–99)
Glucose-Capillary: 129 mg/dL — ABNORMAL HIGH (ref 70–99)

## 2020-05-03 LAB — HEMOGLOBIN AND HEMATOCRIT, BLOOD
HCT: 26.8 % — ABNORMAL LOW (ref 36.0–46.0)
Hemoglobin: 8.8 g/dL — ABNORMAL LOW (ref 12.0–15.0)

## 2020-05-03 LAB — PROTEIN, PLEURAL OR PERITONEAL FLUID: Total protein, fluid: <3 g/dL

## 2020-05-03 LAB — ALBUMIN, PLEURAL OR PERITONEAL FLUID: Albumin, Fluid: 1.6 g/dL

## 2020-05-03 LAB — CYTOLOGY - NON PAP

## 2020-05-03 LAB — LACTATE DEHYDROGENASE, PLEURAL OR PERITONEAL FLUID: LD, Fluid: 357 U/L — ABNORMAL HIGH (ref 3–23)

## 2020-05-03 LAB — PHOSPHORUS: Phosphorus: 4.6 mg/dL (ref 2.5–4.6)

## 2020-05-03 LAB — MAGNESIUM: Magnesium: 2.3 mg/dL (ref 1.7–2.4)

## 2020-05-03 MED ORDER — IPRATROPIUM-ALBUTEROL 0.5-2.5 (3) MG/3ML IN SOLN
3.0000 mL | Freq: Two times a day (BID) | RESPIRATORY_TRACT | Status: DC
  Administered 2020-05-03 – 2020-05-04 (×2): 3 mL via RESPIRATORY_TRACT
  Filled 2020-05-03 (×2): qty 3

## 2020-05-03 MED ORDER — TRAVASOL 10 % IV SOLN
INTRAVENOUS | Status: AC
  Filled 2020-05-03: qty 900

## 2020-05-03 MED ORDER — LIDOCAINE HCL 1 % IJ SOLN
INTRAMUSCULAR | Status: AC
  Filled 2020-05-03: qty 20

## 2020-05-03 MED ORDER — ALBUMIN HUMAN 25 % IV SOLN
25.0000 g | Freq: Three times a day (TID) | INTRAVENOUS | Status: AC
  Administered 2020-05-03 – 2020-05-05 (×6): 25 g via INTRAVENOUS
  Filled 2020-05-03 (×7): qty 100

## 2020-05-03 MED ORDER — FENTANYL CITRATE (PF) 100 MCG/2ML IJ SOLN
25.0000 ug | Freq: Once | INTRAMUSCULAR | Status: AC
  Administered 2020-05-03: 25 ug via INTRAVENOUS
  Filled 2020-05-03: qty 2

## 2020-05-03 NOTE — Plan of Care (Signed)

## 2020-05-03 NOTE — Progress Notes (Signed)
18 Days Post-Op Procedure(s) (LRB): CYSTOSCOPY WITH RETROGRADE PYELOGRAM/URETERAL STENT PLACEMENT (Right) bladder biopsy with fulguration of bleeders (N/A)  64 year old female currently admitted for metastatic cervical cancer s/p stent placement and cycle 1 of carboplatin and taxol on 04/20/2020 with multiple issues including bowel obstruction due to carcinomatosis with TPN and NG tube placement, symptomatic pleural effusions, increasing creatinine.   Subjective: Patient reports improvement in the abdominal tightness. No flatus or BM. Reports difficulty breathing at times. Very weak. Wants to take one day at a time in regards to goals etc. Agreeable to having a thoracentesis today along with G tube placement if possible after discussion with Dr. Berline Lopes. Decrease in nausea and emesis with the NG functioning at this time.  Objective: Vital signs in last 24 hours: Temp:  [97.6 F (36.4 C)-99.8 F (37.7 C)] 97.8 F (36.6 C) (09/01 1356) Pulse Rate:  [86-104] 95 (09/01 1356) Resp:  [18-20] 19 (09/01 1356) BP: (78-108)/(40-65) 83/55 (09/01 1356) SpO2:  [88 %-98 %] 97 % (09/01 1356) Last BM Date: 05/03/20 (has rectal tube)  Intake/Output from previous day: 08/31 0701 - 09/01 0700 In: 1761.1 [I.V.:1131.1; Blood:630] Out: 1850 [Urine:850; Emesis/NG output:925; Stool:75]  Physical Examination: General: cooperative, fatigued, no distress and lethargic Resp: audible congested breath sounds, audible wheezing GI: Hypoactive bowel sounds, moderately distended, tympanic, no tickling noted, mildly tense Extremities: generalized edema noted   Labs: WBC/Hgb/Hct/Plts:  --/8.8/26.8/-- (09/01 0405) BUN/Cr/glu/ALT/AST/amyl/lip:  100/1.92/--/25/25/--/-- (09/01 0405)  Assessment: 64 y.o. s/p Procedure(s): CYSTOSCOPY WITH RETROGRADE PYELOGRAM/URETERAL STENT PLACEMENT. S/P cycle 1 of carboplatin and taxol on 04/20/2020.  Pain:  Pain is well-controlled on PRN medications.  Heme: Hgb 8.8 and Hct 26.8 this  am-continue to monitor. PLT 79 yesterday-expected s/p chemotherapy-continue to monitor.  ID: WBC count 6.5 yesterday am with ANC 4.5-improving given recent chemotherapy-monitored by Dr. Alvy Bimler.   CV: Mildly tachycardic at times. Hypotension. Continue to monitor with ordered vital signs.   GI:  Tolerating po: no, NPO- NG tube to intermittent suction. TPN running through PICC line. Abd 2 view on 05/02/2020 resulting findings consistent with an underlying partial small bowel obstruction or ileus, similar to prior examination. GI following as well.   GU: S/P stent placement. Creatinine 1.92 this am- worsening and nephrology consulted.  FEN: No critical values noted.  Prophylaxis: SCDs.   Plan: For thoracentesis today. Plan to speak with IR about reconsideration for G tube placement. Continue TPN per pharmacy Continue plan of care per Dr. Alvy Bimler, Hospitalist, Dr. Berline Lopes   LOS: 19 days    Eileen Burgess 05/03/2020, 3:28 PM

## 2020-05-03 NOTE — Progress Notes (Signed)
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  Observe closely  AKIdue to hydronephrosis and possible dehydration/3rd spacing nephrology consulted  Protein calorie malnutrition evidence of 3rd spacing in addition to poor oral intake Dietitian is following Continue TPN  subacute bowel obstruction secondary to carcinomatosis NG tube is in for almost a week we have consulted interventional radiologist to replace with a G-tube this is strictly palliative in nature  Ascites Moderate ascites noted on CT scan she had repeat paracentesis on August 31  Recent shortness of breath/respiratory distress/vascular congestion with pulmonary edema, resolved Remains off oxygen but still having some shortness of breath She has received Lasix on several occasions this admission consider thoracentesis  Goals of care discussion She is aware she have stage IV disease Treatment goal is palliative The goal for treatment in this hospital stay is to resolve her bowel obstruction prior to discharge overall, she is declining although she have more bowel sounds today  Discharge planning She will likely be here for the next 3 to 5 days All questions were answered. The patient knows to call the clinic with any problems,  questions or concerns.   [name], MD 05/03/2020 8:56 AM  Subjective:  She is sleeping but intermittently alert.  She denies pain no nausea.  She felt that overall, her breathing has improved NG tube appears to be working  Objective:  Vitals:   05/03/20 0220 05/03/20 0542  BP: 107/65 99/64  Pulse: 86 93  Resp: 18 20  Temp: 98.2 F (36.8 C) 97.6 F (36.4 C)  SpO2: (!) 88% 92%     Intake/Output Summary (Last 24 hours) at 05/03/2020 0856 Last data filed at 05/03/2020 0521 Gross per 24 hour  Intake 1761.05 ml  Output 1850 ml  Net -88.95 ml    GENERAL:alert, but appears sleepy HEART: regular rate & rhythm and no murmurs and no lower extremity edema ABDOMEN:abdomen distended, active bowel sounds more so today compared to yesterday Musculoskeletal:no cyanosis of digits and no clubbing  NEURO: alert & oriented x 3 with fluent speech, but somewhat sleepy   Labs:  Recent Labs    05/01/20 0408 05/02/20 0337 05/03/20 0405  NA 137 137 139  K 4.1 3.9 3.8  CL 109 106 108  CO2 20* 20* 22  GLUCOSE 134* 129* 135*  BUN 77* 85* 100*  CREATININE 1.62* 1.89* 1.92*  CALCIUM 8.2* 8.3* 8.1*  GFRNONAA 33* 27* 27*  GFRAA 38* 31* 31*  PROT 5.0* 5.0* 4.9*  ALBUMIN 2.0* 1.9* 1.7*  AST 39 31 25  ALT 25 28 25   ALKPHOS 70 70 65  BILITOT 0.4 0.5 0.7    Studies:  CT ABDOMEN WO CONTRAST  Result Date: 05/02/2020 CLINICAL DATA:  Evaluate anatomy prior to potential percutaneous gastrostomy tube placement. EXAM: CT ABDOMEN WITHOUT CONTRAST TECHNIQUE: Multidetector CT imaging of the abdomen was performed following the standard protocol without IV  contrast. COMPARISON:  CT abdomen pelvis - 04/14/2020; abdominal radiograph-05/01/2020; 04/29/2020; 04/27/2020 FINDINGS: Lack of intravenous contrast limits the ability to evaluate solid abdominal organs. Lower chest: Limited visualization of the lower thorax demonstrates small to moderate-sized bilateral pleural effusions with associated bibasilar  consolidative opacities, right greater than left. Borderline cardiomegaly.  Small pericardial effusion. Hepatobiliary: There is mild nodularity of the hepatic contour suggestive of cirrhotic change. Normal noncontrast appearance of the gallbladder given degree distention. No radiopaque gallstones. Moderate volume intra-abdominal ascites. Pancreas: Normal noncontrast appearance of the pancreas. Spleen: Normal noncontrast appearance of the spleen. Adrenals/Urinary Tract: Post right-sided ureteral stent placement. The right kidney is slightly atrophic in comparison to the left. No renal stones. There is a minimal amount likely age and body habitus related bilateral perinephric stranding. No urine obstruction. Normal noncontrast appearance the bilateral adrenal glands. The urinary bladder was not imaged. Stomach/Bowel: The anterior wall of the gastric antrum is well apposed against the ventral wall of the upper abdomen without interposition of the hepatic parenchyma or transverse colon. Enteric tube tip terminates within the descending portion of the duodenum. There is moderate distension majority of the visualized loops of large and small bowel, similar to abdominal radiographs, incompletely evaluated though potentially the sequela of ileus. No pneumoperitoneum, pneumatosis or portal venous gas. Vascular/Lymphatic: Moderate amount of atherosclerotic plaque within a normal caliber abdominal aorta. No bulky retroperitoneal or mesenteric adenopathy on this noncontrast examination. Other: Moderate to large amount of diffuse body wall anasarca, most conspicuous about the midline of the low back and bilateral flanks. Musculoskeletal: No acute or aggressive osseous abnormalities. Stigmata of dish within the lower thoracic spine. IMPRESSION: 1. Despite amenable gastric anatomy, the presence moderate volume intra-abdominal ascites renders the patient a poor candidate for gastrostomy tube placement. 2. Suspected ileus,  incompletely evaluated. 3. Moderate sized bilateral pleural effusions with associated bibasilar atelectasis, right greater than left. 4. Small pericardial effusion. Further evaluation cardiac echo could be performed as indicated. 5.  Aortic Atherosclerosis (ICD10-I70.0). 6. Post right-sided ureteral stent placement with mild asymmetric atrophy of the right kidney comparison to left. No evidence of urinary obstruction. Electronically Signed   By: Sandi Mariscal M.D.   On: 05/02/2020 08:14   DG Chest 1 View  Result Date: 04/30/2020 CLINICAL DATA:  Shortness of breath and cough. EXAM: CHEST  1 VIEW COMPARISON:  Chest radiograph dated 04/28/2020. FINDINGS: The heart size and mediastinal contours are within normal limits. Opacity of the right hemithorax likely reflects a layering pleural effusion with associated atelectasis. A small left pleural effusion with associated atelectasis is noted. Mild-to-moderate bibasilar airspace opacities likely contribute. Mild diffuse bilateral interstitial opacities are noted. There is no pneumothorax. A right upper extremity peripherally inserted central venous catheter tip overlies the superior vena cava. An enteric tube enters the stomach and terminates below the field of view. IMPRESSION: Bilateral pleural effusions with associated atelectasis and/or airspace opacities. Mild diffuse bilateral interstitial opacities may represent pulmonary edema or infection. Electronically Signed   By: Zerita Boers M.D.   On: 04/30/2020 15:04   DG Chest 1 View  Result Date: 04/20/2020 CLINICAL DATA:  Increasing short of breath EXAM: CHEST  1 VIEW COMPARISON:  04/13/2020, CT 04/15/2020 FINDINGS: Right upper extremity central venous catheter tip over the SVC. Cardiomegaly with vascular congestion and diffuse interstitial opacity consistent with edema. Moderate right-sided pleural effusion, likely layering and resulting in hazy asymmetric appearance of the right thorax. At least small left  pleural effusion. Bibasilar consolidations. No pneumothorax. IMPRESSION: 1. Cardiomegaly  with vascular congestion and diffuse interstitial pulmonary edema. 2. Bilateral pleural effusions, at least moderate on the right and small on the left. 3. Bibasilar consolidations Electronically Signed   By: Donavan Foil M.D.   On: 04/20/2020 22:20   DG Chest 2 View  Result Date: 04/13/2020 CLINICAL DATA:  Weakness diarrhea EXAM: CHEST - 2 VIEW COMPARISON:  07/28/2004 FINDINGS: Heart size upper normal.  Vascularity normal.  Coronary stent noted. Small bilateral pleural effusions. Mild bibasilar atelectasis/infiltrate. IMPRESSION: Interval development of mild bibasilar airspace disease and small pleural effusions. Negative for heart failure. Electronically Signed   By: Franchot Gallo M.D.   On: 04/13/2020 13:57   DG Abd 1 View  Result Date: 05/01/2020 CLINICAL DATA:  Follow-up ileus EXAM: ABDOMEN - 1 VIEW COMPARISON:  04/29/2020 abdominal radiograph FINDINGS: Enteric tube terminates in the descending duodenum. Stable right nephroureteral stent. Mildly dilated small bowel loops in right abdomen appears slightly increased. Moderate gaseous distention of the colon appears similar. No evidence of pneumatosis or pneumoperitoneum. No radiopaque nephrolithiasis. IMPRESSION: 1. Enteric tube terminates in the descending duodenum. 2. Mildly dilated small bowel loops in right abdomen appear slightly increased. Stable moderate gaseous distention of the colon. Findings are compatible with persistent adynamic ileus. Electronically Signed   By: Ilona Sorrel M.D.   On: 05/01/2020 10:05   DG Abd 1 View  Result Date: 04/29/2020 CLINICAL DATA:  Follow-up ileus EXAM: ABDOMEN - 1 VIEW COMPARISON:  April 27, 2020 FINDINGS: A right ureteral stent is stable. In NG tube is identified. I suspect the distal tip may be in the proximal duodenum. Air-filled prominent colon remains. No small bowel dilatation. No other acute abnormalities.  IMPRESSION: 1. Support apparatus as above. 2. Continued air-filled prominent colon may represent ileus. No other abnormalities. Electronically Signed   By: Dorise Bullion III M.D   On: 04/29/2020 09:40   DG Abd 1 View  Result Date: 04/27/2020 CLINICAL DATA:  Obstruction EXAM: ABDOMEN - 1 VIEW COMPARISON:  Portable exam 1047 hours compared 04/22/2020 FINDINGS: RIGHT ureteral stent unchanged. Gaseous distention of colon from tip of cecum through splenic flexure. Absent gas within descending and rectosigmoid colon. Small bowel gas pattern normal. No bowel wall thickening or pathologic calcification. IMPRESSION: Mild gaseous distention of the proximal half the colon with absent gas distally, could reflect ileus but unable to exclude distal colonic obstruction with this appearance. Electronically Signed   By: Lavonia Dana M.D.   On: 04/27/2020 11:01   CT CHEST W CONTRAST  Result Date: 04/15/2020 CLINICAL DATA:  Cancer of unknown primary, staging. EXAM: CT CHEST WITH CONTRAST TECHNIQUE: Multidetector CT imaging of the chest was performed during intravenous contrast administration. CONTRAST:  58mL OMNIPAQUE IOHEXOL 300 MG/ML  SOLN COMPARISON:  None. FINDINGS: Cardiovascular: There is mild calcification of the aortic arch. Normal heart size. No pericardial effusion. A coronary artery stent is seen. Mediastinum/Nodes: No enlarged mediastinal, hilar, or axillary lymph nodes. The thyroid gland and trachea demonstrate no significant findings. There is a small hiatal hernia. Lungs/Pleura: Mild bilateral lower lobe atelectasis is seen, right slightly greater than left. Very mild slightly nodular appearing scarring and/or atelectasis is seen along the posterior aspect of the inferior left upper lobe. There is a small left pleural effusion. A moderate sized right pleural effusion is also noted. No pneumothorax is identified. Upper Abdomen: A moderate amount of abdominal free fluid is seen. There is moderate to marked  severity right-sided hydronephrosis with delayed renal cortical enhancement involving the right kidney. Musculoskeletal: No  chest wall abnormality. No acute or significant osseous findings. Degenerative changes seen throughout the thoracic spine. IMPRESSION: 1. Small left pleural effusion with a moderate sized right pleural effusion. 2. Mild bilateral lower lobe atelectasis, right slightly greater than left. 3. Very mild slightly nodular appearing scarring and/or atelectasis along the posterior aspect of the inferior left upper lobe. 4. Moderate amount of abdominal free fluid. 5. Moderate to marked severity right-sided hydronephrosis with delayed renal cortical enhancement involving the right kidney. This is suggestive of partial renal obstruction. 6. Small hiatal hernia. 7. Aortic atherosclerosis. Aortic Atherosclerosis (ICD10-I70.0). Electronically Signed   By: Virgina Norfolk M.D.   On: 04/15/2020 15:38   US Paracentesis  Result Date: 05/02/2020 INDICATION: Patient with history of stage IV cervical cancer/adenocarcinoma, carcinomatosis, ascites. Request made for diagnostic and therapeutic paracentesis. EXAM: ULTRASOUND GUIDED DIAGNOSTIC AND THERAPEUTIC PARACENTESIS MEDICATIONS: 1% lidocaine to skin and subcutaneous tissue COMPLICATIONS: None immediate. PROCEDURE: Informed written consent was obtained from the patient after a discussion of the risks, benefits and alternatives to treatment. A timeout was performed prior to the initiation of the procedure. Initial ultrasound scanning demonstrates a large amount of ascites within the right lower abdominal quadrant. The right lower abdomen was prepped and draped in the usual sterile fashion. 1% lidocaine was used for local anesthesia. Following this, a 19 gauge, 10-cm, Yueh catheter was introduced. An ultrasound image was saved for documentation purposes. The paracentesis was performed. The catheter was removed and a dressing was applied. The patient tolerated  the procedure well without immediate post procedural complication. FINDINGS: A total of approximately 3.7 liters of slightly hazy, amber fluid was removed. Samples were sent to the laboratory as requested by the clinical team. IMPRESSION: Successful ultrasound-guided diagnostic and therapeutic paracentesis yielding 3.7 liters of peritoneal fluid. Read by: Rowe Robert, PA-C Electronically Signed   By: Jerilynn Mages.  Shick M.D.   On: 05/02/2020 11:15   DG Chest Port 1 View  Result Date: 04/28/2020 CLINICAL DATA:  64 year old female with persistent cough. Query fluid overload. EXAM: PORTABLE CHEST 1 VIEW COMPARISON:  Portable chest 04/20/2020 and earlier. FINDINGS: Portable AP semi upright view at 1214 hours. Stable right PICC line. Enteric tube has been placed and courses to the stomach, tip not included. Improved bilateral ventilation, but continued basilar predominant increased interstitial opacity, and dense retrocardiac opacity. Veiling opacity on the right is compatible with a superimposed pleural effusion. No pneumothorax. Upper lung pulmonary vascularity appears more normal now. Mediastinal contours are stable and within normal limits. Dense retrocardiac opacity without air bronchograms. Paucity of bowel gas. No acute osseous abnormality identified. IMPRESSION: 1. Suspect regression of pulmonary edema since 04/20/2020, but with residual vascular congestion, small right pleural effusion, and superimposed left lower lobe collapse or consolidation. 2. Enteric tube placed into the stomach, tip not included. Electronically Signed   By: Genevie Ann M.D.   On: 04/28/2020 12:35   DG Abd 2 Views  Result Date: 05/02/2020 CLINICAL DATA:  Weakness, diarrhea EXAM: ABDOMEN - 2 VIEW COMPARISON:  05/01/2020 FINDINGS: Nasogastric tube likely extends to the second portion of the duodenum with its proximal side hole in the region of the pylorus. This is unchanged from prior examination. Dilated loops of small bowel are again seen  within the mid abdomen and gas is seen within multiple nondilated loops of large bowel in keeping with changes of an underlying of partial small bowel obstruction or ileus. No gross free intraperitoneal gas. Moderate right pleural effusion again noted. Right double-J ureteral stent overlies its  expected position. Pelvis excluded from view. IMPRESSION: 1. Findings consistent with an underlying partial small bowel obstruction or ileus, similar to prior examination. 2. Nasogastric tube tip in the second portion of the duodenum. Electronically Signed   By: Fidela Salisbury MD   On: 05/02/2020 19:27   DG Abd Portable 1V  Result Date: 04/27/2020 CLINICAL DATA:  Nasogastric tube placement EXAM: PORTABLE ABDOMEN - 1 VIEW COMPARISON:  04/27/2020 FINDINGS: Interval placement of esophagogastric tube, tip and side port below the diaphragm, looped in the gastric fundus. Diffusely distended colon similar to prior examination. Partially imaged right double-J ureteral stent. IMPRESSION: 1. Interval placement of esophagogastric tube, tip and side port below the diaphragm, looped in the gastric fundus. 2. Diffusely distended colon similar to prior examination. Electronically Signed   By: Eddie Candle M.D.   On: 04/27/2020 13:33   DG Abd Portable 2V  Result Date: 04/22/2020 CLINICAL DATA:  Decreased bowel sounds EXAM: PORTABLE ABDOMEN - 2 VIEW COMPARISON:  04/21/2020 FINDINGS: Supine frontal views of the abdomen and pelvis are obtained. Right ureteral stent is stable. There is increasing gaseous distention of the colon, most compatible with ileus. No evidence of small-bowel obstruction. No masses or abnormal calcifications. IMPRESSION: 1. Progressive gaseous distention of the colon most consistent with ileus. 2. Stable right ureteral stent. Electronically Signed   By: Randa Ngo M.D.   On: 04/22/2020 15:25   DG Abd Portable 2V  Result Date: 04/21/2020 CLINICAL DATA:  64 year old female with suspected GU cancer, omental  caking on recent CT. Hypoactive bowel sounds. EXAM: PORTABLE ABDOMEN - 2 VIEW COMPARISON:  KUB 04/20/2020.  CT Abdomen and Pelvis 04/14/2020. FINDINGS: Upright and supine views of the abdomen and pelvis. Stable right double-J ureteral stent. Mildly to moderately gas distended large bowel again noted, with gas present to the mid sigmoid. As before, no dilated small bowel loops. But gas is increased since 04/14/2020. No pneumoperitoneum. Stable lung bases, right pleural effusion again evident. Stable visualized osseous structures. IMPRESSION: 1. Continued gas distended colon to the sigmoid, although no dilated small bowel to strongly suggest mechanical obstruction. Continue to favor ileus. 2. No free air. Stable right ureteral stent. Persistent right pleural effusion. Electronically Signed   By: Genevie Ann M.D.   On: 04/21/2020 13:54   DG Abd Portable 2V  Result Date: 04/20/2020 CLINICAL DATA:  Hypoactive bowel sounds. EXAM: PORTABLE ABDOMEN - 2 VIEW COMPARISON:  No prior. FINDINGS: Double-J right ureteral stent in good anatomic position. Colon is slightly distended. Cecum measures 8.9 cm in diameter. Although these findings most likely represent colonic ileus follow-up exam suggested to exclude colonic obstruction. No small bowel distention. No free air. Bibasilar atelectasis/infiltrates. Small right pleural effusion. IMPRESSION: 1.  Double-J right ureteral stent in good anatomic position. 2. Colon is slightly distended. Cecum measures 8.9 cm. Although these findings most likely represent colonic ileus, follow-up exam suggested to exclude colonic obstruction. No small bowel distention. No free air. 3.  Bibasilar atelectasis/infiltrates. Electronically Signed   By: Marcello Moores  Register   On: 04/20/2020 13:00   DG C-Arm 1-60 Min-No Report  Result Date: 04/15/2020 Fluoroscopy was utilized by the requesting physician.  No radiographic interpretation.   CT RENAL STONE STUDY  Result Date: 04/14/2020 CLINICAL DATA:   64 year old female with hematuria. EXAM: CT ABDOMEN AND PELVIS WITHOUT CONTRAST TECHNIQUE: Multidetector CT imaging of the abdomen and pelvis was performed following the standard protocol without IV contrast. COMPARISON:  None. FINDINGS: Evaluation of this exam is limited in the absence  of intravenous contrast. Lower chest: Partially visualized moderate right and small left pleural effusions. There is associated partial compressive atelectasis of the lower lobes. Pneumonia is not excluded. Clinical correlation is recommended. There is coronary vascular calcification. Partially visualized trace pericardial effusion. There is no intra-abdominal free air. Small ascites. Hepatobiliary: No focal liver abnormality is seen. No gallstones, gallbladder wall thickening, or biliary dilatation. Pancreas: Unremarkable. No pancreatic ductal dilatation or surrounding inflammatory changes. Spleen: Normal in size without focal abnormality. Adrenals/Urinary Tract: The adrenal glands unremarkable. The left kidney is unremarkable. There is a moderate right hydronephrosis with mild right hydroureter. No stone identified. There is a transition point in the distal right ureter or right UVJ. The urinary bladder is minimally distended. There is possible mild nodular thickening of the posterior bladder wall adjacent to the right UVJ (77/2). There is loss of fat plane between the anterior lower uterus/cervix and posterior bladder wall. The lower uterus/cervical region is somewhat enlarged. Findings concerning for a neoplastic process either arising from the bladder urothelium or from the cervix and invading into the posterior bladder wall with obstruction of the right UVJ or distal right ureter. Stomach/Bowel: There is a small hiatal hernia. There is no bowel obstruction. There is sigmoid diverticulosis. The appendix is not visualized with certainty. No inflammatory changes identified in the right lower quadrant. Vascular/Lymphatic: Moderate  aortoiliac atherosclerotic disease. The IVC is unremarkable. No portal venous gas. There is no adenopathy. Reproductive: The uterus is anteverted. Enlargement of the lower uterus/cervical region as described above. Other: There is diffuse omental nodularity and caking consistent with metastatic disease. Musculoskeletal: Osteopenia. No acute osseous pathology. IMPRESSION: 1. Findings concerning for a neoplastic process either arising from the bladder urothelium or from the cervix and invading into the posterior bladder wall with obstruction of the right UVJ or distal right ureter. There is associated moderate right hydronephrosis. Sampling of the ascitic fluid may provide further diagnostic information. 2. Diffuse omental nodularity and caking consistent with metastatic disease. 3. Moderate right and small left pleural effusions with associated partial compressive atelectasis of the lower lobes. Pneumonia is not excluded. Clinical correlation is recommended. 4. Sigmoid diverticulosis. No bowel obstruction. 5. Aortic Atherosclerosis (ICD10-I70.0). Electronically Signed   By: Anner Crete M.D.   On: 04/14/2020 17:44   [place] EKG SITE RITE  Result Date: 04/18/2020 If Site Rite image not attached, placement could not be confirmed due to current cardiac rhythm.

## 2020-05-03 NOTE — Progress Notes (Signed)
PHARMACY - TOTAL PARENTERAL NUTRITION CONSULT NOTE   Indication: Prolonged ileus  Patient Measurements: Height: 5\' 4"  (162.6 cm) Weight: 105 kg (231 lb 7.7 oz) IBW/kg (Calculated) : 54.7 TPN AdjBW (KG): 65.5 Body mass index is 39.73 kg/m. Usual Weight: 98 kg  Assessment: 64 y/o F found to have metastatic gynecologic cancer with carcinomatosis and ureteral obstruction s/p stent placement with minimal oral intake for over a week, possible ileus. Pharmacy consulted to manage TPN.   Glucose / Insulin: CBGs 122-130.  Goal CBGs 100-150. 3 units SSI/24 hours.  Electrolytes: Phos improved to 4.6 after removing Phos in TPN yesterday. K+ slightly below goal at 3.8. All others, including Corrected Ca, WNL.  -Given concern for possible ileus, target K+ > 4, Mag > 2.  Renal: SCr elevated/increased to 1.92. BUN elevated/increased to 100. (Nephrology consulted)  LFTs / TGs: WNL/ TG 194 (8/24), 141 (8/30) Prealbumin / albumin: 8.8 (8/30)/ 1.7 (9/1) Intake / Output; MIVF: I/O 1761/1850 (including urine and NG output) MIVF: none, NS stopped by MD on 8/29 GI Imaging: 8/21 Abd x-ray: ileus vs obstruction 8/26 Abd x-ray: possible ileus, unable to exclude distal colonic obstruction 8/28 Abd x-ray: Ureteral stent is stable.  Continued air-filled prominent colon may represent ileus 8/30 Abd x-ray: Mildly dilated small bowel loops in right abdomen appear slightly increased. Stable moderate gaseous distention of the colon. Findings are compatible with persistent adynamic ileus.  8/30 CT abd: Moderate volume intra-abdominal ascites. Suspected ileus, incompletely evaluated. Moderate sized bilateral pleural effusions with associated bibasilar atelectasis, right greater than left.  8/31 Abd x-ray: Findings consistent with an underlying partial small bowel obstruction or ileus, similar to prior examination.  Surgeries / Procedures:  8/14 ureteral stent placement  8/31 paracentesis  Significant Events: -8/26: N/V,  NGT placed. CLD > NPO  Central access: PICC 8/18 TPN start date: 8/23  Nutritional Goals (per RD recommendation on 8/26): kCal: 1700-1900, Protein: 80-90g, Fluid: 1.8 L/d Goal TPN rate is 75 mL/hr (provides 90 g of protein and 1821 kcals per day); meeting >100% of patient needs  Note:  Intralipid product ordered in TPN from 8/23-8/28.  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 Pharmacist was informed on 8/28 that SMOFlipids had been used to prepare the TPN from 8/23-8/27 despite the Intralipid orders and that Intralipid was not available for the TPN on 8/28. The order was changed from Intralipid to SMOFlipids. Will continue with SMOFlipids at this time, as it appears she has been tolerating for several days.   Current Nutrition:  TPN, NPO  Plan:   Continue TPN at goal rate of 75 mL/hr  Electrolytes in TPN:   Na 62mEq/L,increase K+ to 34mEq/L, Ca 7mEq/L, Mag 5 mEq/L, continue no Phos  Cl:Ac ratio max Acetate   Add standard MVI and trace elements to TPN  CBG check + sensitive SSI q8h  MIVF per MD (none at this time)   Monitor TPN labs on Monday/Thursday  Monitor for ability to advance diet   Lindell Spar, PharmD, [place] [phone] 05/03/2020 7:56 AM

## 2020-05-03 NOTE — Progress Notes (Signed)
PROGRESS NOTE  Eileen Burgess  YWV:[id number] DOB: Jun 19, 1956 DOA: 04/13/2020 PCP: Patient, No Pcp Per   Brief Narrative: Ms. Honeyman is a 64 y.o.female with PMH of CAD s/p LAD angioplasty, HTN, HLD, remote tobacco use, GERD who presented to [facility] ED 8/12 for 3 weeks of episodic diarrhea, decreased PO intake, dysuria, malaise and fatigue. CT done 8/13 showed a pelvic mass that was concerning for malignancy with evidence of possible metastatic disease. Ureteral stent was placed for R sided obstruction. GYN-ONC consulted 8/16 and felt that based on patient exam that patient likely had advanced cervical cancer. Biopsies were taken of bladder and cervix and both came back as adenocarcinoma. CA 125 also found to be elevated. Medical oncology has been following and patient has received one cycle carboplatin and paclitaxel 8/19. She developed protracted ileus and had NGT placed for this. TPN has been started for malnutrition. Patient also developed significant ascites for which paracentesis has been performed 8/31. Thoracentesis was performed 9/1. IR consulted for palliative venting gastrostomy.   Assessment & Plan: Principal Problem:   Cervical cancer ([place]) Active Problems:   Essential hypertension   UTI (urinary tract infection)   AKI (acute kidney injury) ([facility])   Hyponatremia   Orthostatic hypotension   Hyponatremia syndrome  Metastatic cervical cancer with peritoneal carcinomatosis: New diagnosis.  - Oncology following, s/p cycle 1 of carboplatin and paclitaxel on 04/20/2020  SBO, carcinomatous ileus:  - Continue NGT, antiemetics, analgesics - IR consulted for palliative venting gastrostomy - TPN started through PICC  Malignant ascites: s/p paracentesis 3.7L on 8/31 - Cytology positive for adenocarcinoma.  Pleural effusion: s/p right thoracentesis 1.1L on 9/1  Severe protein calorie malnutrition: With multifactorial third-spacing of fluids worsened by hypoalbuminemia.  - TPN as above.    Pancytopenia: Due to chemotherapy.  - Per onc, transfer for hgb <8g/dl or platelets <10k. Repeat CBC this PM showing hgb 8.8g/dl. - Hold heparin with thrombocytopenia and need for procedures  AKI on stage IIIa CKD: Due to prerenal azotemia, possibly ATN. Developing after ureteral stent was placed.  - Hold ACE and HCTZ - Nephrology consulted. Continue intermittent albumin for hemodynamic support with severity of third-spacing.   CAD: s/p PTCA. No anginal complaints.  - Holding metoprolol with soft BP, holding ASA with thrombocytopenia  Hyperkalemia: Resolved  GERD - Continue PPI  Goals of care: Goals of Tx are palliative in nature, the patient is aware of this.  - Remains full code.   - Received chemotherapy for malignancy.  - Broached possibility of palliative care consult which the patient would like to defer at this time.  Obesity: Estimated body mass index is 39.73 kg/m as calculated from the following:   Height as of this encounter: 5\' 4"  (1.626 m).   Weight as of this encounter: 105 kg.  DVT prophylaxis: SCDs Code Status: Full Family Communication: Son by phone today Disposition Plan:  Status is: Inpatient  Remains inpatient appropriate because:Persistent severe electrolyte disturbances and Inpatient level of care appropriate due to severity of illness  Dispo: The patient is from: Home              Anticipated d/c is to: Home              Anticipated d/c date is: > 3 days              Patient currently is not medically stable to d/c.  Consultants:   Oncology  GynOnc  Nephrology  General surgery  GI  Interventional  radiology  Procedures:  04/15/20 Dr. Alinda Money CYSTOSCOPY WITH RETROGRADE PYELOGRAM/URETERAL STENT PLACEMENT    Cervical biopsy  Subjective: Feels very tired, pain controlled, still nauseated, no current vomiting. Completely oriented.  Objective: Vitals:   05/03/20 0858 05/03/20 1302 05/03/20 1318 05/03/20 1356  BP:  (!) 86/48 (!)  78/40 (!) 83/55  Pulse:  96  95  Resp:    19  Temp:    97.8 F (36.6 C)  TempSrc:    Oral  SpO2: 93% 95% (!) 88% 97%  Weight:      Height:        Intake/Output Summary (Last 24 hours) at 05/03/2020 1728 Last data filed at 05/03/2020 1130 Gross per 24 hour  Intake 1761.05 ml  Output 1725 ml  Net 36.05 ml   Filed Weights   04/13/20 1234 04/24/20 0830 05/01/20 0838  Weight: 98 kg 98.2 kg 105 kg    Gen: Frail female in no distress Pulm: Non-labored breathing. Diminished R > L bilaterally.  CV: Regular rate and rhythm. No murmur, rub, or gallop. No definite JVD, + pedal edema. GI: Abdomen soft, non-tender, non-distended, with normoactive bowel sounds. No organomegaly or masses felt. Ext: Warm, no deformities Skin: No rashes, lesions or ulcers Neuro: Alert and oriented. No focal neurological deficits. Psych: Judgement and insight appear normal. Mood & affect appropriate.   Data Reviewed: I have personally reviewed following labs and imaging studies  CBC: Recent Labs  Lab 04/27/20 0603 04/27/20 0603 04/28/20 0604 04/29/20 0829 05/01/20 0408 05/02/20 1500 05/03/20 0405  WBC 1.2*  --  0.8* 1.3* 5.0 6.5  --   NEUTROABS 0.7*  --  0.2* 0.3* 3.0 4.5  --   HGB 11.4*   < > 9.9* 10.0* 7.7* 7.2* 8.8*  HCT 37.3   < > 31.1* 31.9* 24.2* 22.2* 26.8*  MCV 89.2  --  86.4 86.7 87.1 85.7  --   PLT 89*  --  83* 87* 77* 79*  --    < > = values in this interval not displayed.   Basic Metabolic Panel: Recent Labs  Lab 04/29/20 0803 04/30/20 0952 05/01/20 0408 05/02/20 0337 05/03/20 0405  NA 137 132* 137 137 139  K 4.3 5.6* 4.1 3.9 3.8  CL 105 102 109 106 108  CO2 22 18* 20* 20* 22  GLUCOSE 142* 952* 134* 129* 135*  BUN 47* 53* 77* 85* 100*  CREATININE 1.07* 1.38* 1.62* 1.89* 1.92*  CALCIUM 8.4* 8.5* 8.2* 8.3* 8.1*  MG 2.0 2.4 2.1 2.2 2.3  PHOS 3.6 7.2* 4.5 4.9* 4.6   GFR: Estimated Creatinine Clearance: 34 mL/min (A) (by C-G formula based on SCr of 1.92 mg/dL (H)). Liver  Function Tests: Recent Labs  Lab 04/27/20 0603 04/28/20 0604 05/01/20 0408 05/02/20 0337 05/03/20 0405  AST 24 23 39 31 25  ALT 12 13 25 28 25   ALKPHOS 42 47 70 70 65  BILITOT 0.5 0.5 0.4 0.5 0.7  PROT 5.3* 5.3* 5.0* 5.0* 4.9*  ALBUMIN 2.5* 2.3* 2.0* 1.9* 1.7*   No results for input(s): LIPASE, AMYLASE in the last 168 hours. No results for input(s): AMMONIA in the last 168 hours. Coagulation Profile: No results for input(s): INR, PROTIME in the last 168 hours. Cardiac Enzymes: No results for input(s): CKTOTAL, CKMB, CKMBINDEX, TROPONINI in the last 168 hours. BNP (last 3 results) No results for input(s): PROBNP in the last 8760 hours. HbA1C: No results for input(s): HGBA1C in the last 72 hours. CBG: Recent Labs  Lab 05/02/20  8657 05/02/20 1632 05/03/20 0002 05/03/20 0800 05/03/20 1525  GLUCAP 130* 122* 124* 129* 125*   Lipid Profile: Recent Labs    05/01/20 0408  TRIG 141   Thyroid Function Tests: No results for input(s): TSH, T4TOTAL, FREET4, T3FREE, THYROIDAB in the last 72 hours. Anemia Panel: No results for input(s): VITAMINB12, FOLATE, FERRITIN, TIBC, IRON, RETICCTPCT in the last 72 hours. Urine analysis:    Component Value Date/Time   COLORURINE AMBER (A) 05/02/2020 1830   APPEARANCEUR CLOUDY (A) 05/02/2020 1830   LABSPEC 1.015 05/02/2020 1830   PHURINE 5.0 05/02/2020 1830   GLUCOSEU NEGATIVE 05/02/2020 1830   HGBUR LARGE (A) 05/02/2020 1830   BILIRUBINUR NEGATIVE 05/02/2020 [place] 05/02/2020 1830   PROTEINUR 30 (A) 05/02/2020 1830   NITRITE NEGATIVE 05/02/2020 1830   LEUKOCYTESUR SMALL (A) 05/02/2020 1830   No results found for this or any previous visit (from the past 240 hour(s)).    Radiology Studies: CT ABDOMEN WO CONTRAST  Result Date: 05/02/2020 CLINICAL DATA:  Evaluate anatomy prior to potential percutaneous gastrostomy tube placement. EXAM: CT ABDOMEN WITHOUT CONTRAST TECHNIQUE: Multidetector CT imaging of the abdomen  was performed following the standard protocol without IV contrast. COMPARISON:  CT abdomen pelvis - 04/14/2020; abdominal radiograph-05/01/2020; 04/29/2020; 04/27/2020 FINDINGS: Lack of intravenous contrast limits the ability to evaluate solid abdominal organs. Lower chest: Limited visualization of the lower thorax demonstrates small to moderate-sized bilateral pleural effusions with associated bibasilar consolidative opacities, right greater than left. Borderline cardiomegaly.  Small pericardial effusion. Hepatobiliary: There is mild nodularity of the hepatic contour suggestive of cirrhotic change. Normal noncontrast appearance of the gallbladder given degree distention. No radiopaque gallstones. Moderate volume intra-abdominal ascites. Pancreas: Normal noncontrast appearance of the pancreas. Spleen: Normal noncontrast appearance of the spleen. Adrenals/Urinary Tract: Post right-sided ureteral stent placement. The right kidney is slightly atrophic in comparison to the left. No renal stones. There is a minimal amount likely age and body habitus related bilateral perinephric stranding. No urine obstruction. Normal noncontrast appearance the bilateral adrenal glands. The urinary bladder was not imaged. Stomach/Bowel: The anterior wall of the gastric antrum is well apposed against the ventral wall of the upper abdomen without interposition of the hepatic parenchyma or transverse colon. Enteric tube tip terminates within the descending portion of the duodenum. There is moderate distension majority of the visualized loops of large and small bowel, similar to abdominal radiographs, incompletely evaluated though potentially the sequela of ileus. No pneumoperitoneum, pneumatosis or portal venous gas. Vascular/Lymphatic: Moderate amount of atherosclerotic plaque within a normal caliber abdominal aorta. No bulky retroperitoneal or mesenteric adenopathy on this noncontrast examination. Other: Moderate to large amount of  diffuse body wall anasarca, most conspicuous about the midline of the low back and bilateral flanks. Musculoskeletal: No acute or aggressive osseous abnormalities. Stigmata of dish within the lower thoracic spine. IMPRESSION: 1. Despite amenable gastric anatomy, the presence moderate volume intra-abdominal ascites renders the patient a poor candidate for gastrostomy tube placement. 2. Suspected ileus, incompletely evaluated. 3. Moderate sized bilateral pleural effusions with associated bibasilar atelectasis, right greater than left. 4. Small pericardial effusion. Further evaluation cardiac echo could be performed as indicated. 5.  Aortic Atherosclerosis (ICD10-I70.0). 6. Post right-sided ureteral stent placement with mild asymmetric atrophy of the right kidney comparison to left. No evidence of urinary obstruction. Electronically Signed   By: Sandi Mariscal M.D.   On: 05/02/2020 08:14   DG Abd 1 View  Result Date: 05/03/2020 CLINICAL DATA:  Nasogastric tube placement EXAM:  ABDOMEN - 1 VIEW COMPARISON:  Portable exam 1214 hours compared 05/02/2020 FINDINGS: Tip of nasogastric tube projects over mid stomach. Upper normal heart size with pulmonary vascular congestion. Mediastinal contours normal. RIGHT arm PICC line tip projects over SVC. LEFT lower lobe atelectasis versus consolidation. Moderate RIGHT pleural effusion and basilar atelectasis. Gas in colon. RIGHT ureteral stent visualized. IMPRESSION: Tip of nasogastric tube projects over mid stomach. Persistent LEFT lower lobe atelectasis versus consolidation. Moderate RIGHT pleural effusion. Electronically Signed   By: Lavonia Dana M.D.   On: 05/03/2020 12:48   US Paracentesis  Result Date: 05/02/2020 INDICATION: Patient with history of stage IV cervical cancer/adenocarcinoma, carcinomatosis, ascites. Request made for diagnostic and therapeutic paracentesis. EXAM: ULTRASOUND GUIDED DIAGNOSTIC AND THERAPEUTIC PARACENTESIS MEDICATIONS: 1% lidocaine to skin and  subcutaneous tissue COMPLICATIONS: None immediate. PROCEDURE: Informed written consent was obtained from the patient after a discussion of the risks, benefits and alternatives to treatment. A timeout was performed prior to the initiation of the procedure. Initial ultrasound scanning demonstrates a large amount of ascites within the right lower abdominal quadrant. The right lower abdomen was prepped and draped in the usual sterile fashion. 1% lidocaine was used for local anesthesia. Following this, a 19 gauge, 10-cm, Yueh catheter was introduced. An ultrasound image was saved for documentation purposes. The paracentesis was performed. The catheter was removed and a dressing was applied. The patient tolerated the procedure well without immediate post procedural complication. FINDINGS: A total of approximately 3.7 liters of slightly hazy, amber fluid was removed. Samples were sent to the laboratory as requested by the clinical team. IMPRESSION: Successful ultrasound-guided diagnostic and therapeutic paracentesis yielding 3.7 liters of peritoneal fluid. Read by: Rowe Robert, PA-C Electronically Signed   By: Jerilynn Mages.  Shick M.D.   On: 05/02/2020 11:15   DG Chest Port 1 View  Result Date: 05/03/2020 CLINICAL DATA:  Status post right thoracentesis. EXAM: PORTABLE CHEST 1 VIEW COMPARISON:  04/30/2020 FINDINGS: Decreased right pleural effusion, no longer apparent on the right. Similar small left pleural effusion. No discernible pneumothorax on this semi erect study. Overlying left basilar opacity. Low lung volumes. Gastric tube courses below the diaphragm with the tip likely in the stomach. Similar cardiac silhouette. Right PICC with the tip projecting at the distal SVC. IMPRESSION: 1. Decreased right pleural effusion, no longer apparent. No discernible pneumothorax. 2. Similar small left pleural effusion. Overlying left basilar opacity, which may represent atelectasis, aspiration, or pneumonia. Electronically Signed   By:  Margaretha Sheffield MD   On: 05/03/2020 13:50   DG Abd 2 Views  Result Date: 05/02/2020 CLINICAL DATA:  Weakness, diarrhea EXAM: ABDOMEN - 2 VIEW COMPARISON:  05/01/2020 FINDINGS: Nasogastric tube likely extends to the second portion of the duodenum with its proximal side hole in the region of the pylorus. This is unchanged from prior examination. Dilated loops of small bowel are again seen within the mid abdomen and gas is seen within multiple nondilated loops of large bowel in keeping with changes of an underlying of partial small bowel obstruction or ileus. No gross free intraperitoneal gas. Moderate right pleural effusion again noted. Right double-J ureteral stent overlies its expected position. Pelvis excluded from view. IMPRESSION: 1. Findings consistent with an underlying partial small bowel obstruction or ileus, similar to prior examination. 2. Nasogastric tube tip in the second portion of the duodenum. Electronically Signed   By: Fidela Salisbury MD   On: 05/02/2020 19:27   US THORACENTESIS ASP PLEURAL SPACE W/IMG GUIDE  Result Date: 05/03/2020 INDICATION:  Patient with metastatic gynecologic cancer with carcinomatosis, ascites, and now right pleural effusion. Request made for diagnostic and therapeutic right thoracentesis. EXAM: ULTRASOUND GUIDED DIAGNOSTIC AND THERAPEUTIC RIGHT THORACENTESIS MEDICATIONS: 10 mL 1% lidocaine COMPLICATIONS: None immediate. PROCEDURE: An ultrasound guided thoracentesis was thoroughly discussed with the patient and questions answered. The benefits, risks, alternatives and complications were also discussed. The patient understands and wishes to proceed with the procedure. Written consent was obtained. Ultrasound was performed to localize and mark an adequate pocket of fluid in the right chest. The area was then prepped and draped in the normal sterile fashion. 1% Lidocaine was used for local anesthesia. Under ultrasound guidance a 6 Fr Safe-T-Centesis catheter was introduced.  Thoracentesis was performed. The catheter was removed and a dressing applied. FINDINGS: A total of approximately 1.1 liters of clear, yellow fluid was removed. Samples were sent to the laboratory as requested by the clinical team. IMPRESSION: Successful ultrasound guided diagnostic and therapeutic right thoracentesis yielding 1.1 liters of pleural fluid. Read by: Brynda Greathouse PA-C Electronically Signed   By: Sandi Mariscal M.D.   On: 05/03/2020 14:49    Scheduled Meds: . bisacodyl  10 mg Rectal BID  . Chlorhexidine Gluconate Cloth  6 each Topical Daily  . feeding supplement  1 Container Oral TID BM  . insulin aspart  0-9 Units Subcutaneous Q8H  . ipratropium-albuterol  3 mL Nebulization BID  . lidocaine      . pantoprazole (PROTONIX) IV  40 mg Intravenous Q24H  . sodium chloride flush  10-40 mL Intracatheter Q12H   Continuous Infusions: . albumin human 25 g (05/03/20 1718)  . TPN ADULT (ION) 75 mL/hr at 05/03/20 0521  . TPN ADULT (ION)       LOS: 19 days   Time spent: 35 minutes.  Patrecia Pour, MD [facility] [email or web address] 05/03/2020, 5:28 PM

## 2020-05-03 NOTE — H&P (Signed)
Chief Complaint: Patient was seen in consultation today for malignant bowel obstruction  Referring Physician(s): Gorsuch,Ni  Supervising Physician: Sandi Mariscal  Patient Status: [facility] - In-pt  History of Present Illness: Eileen Burgess is a 64 y.o. female admitted via [facility] ED after evaluation for intermittent diarrhea, decreased PO intake, and malaise at home.  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  She has now endured a prolonged hospitalization with extensive work-up and difficulty managing symptoms.  She has an NGT in place, however with any advancement/malposition of NGT, she has bilious vomiting around the tube.  IR was consulted for possible percutaneous gastrostomy tube placement.  Initial review of case noted several concerns with gastrostomy tube placement including recent ascites, omental disease/carcinomatosis, increased risk of bleeding with percutaneous placement and therefore surgical placement was recommended.  After discussion today with Gyn Onc, reconsideration being made for IR placement under moderate sedation.   Patient assessed at bedside during thoracentesis this afternoon as well as at bedside after procedure.  She is s/p paracentesis 8/31. She is weak, ill-appearing.  She is alert, mentating well but reports fatigue.  She is agreeable to placement of gastrostomy tube for venting purposes.  Currently she is getting TPN for nutrition.   Past Medical History:  Diagnosis Date  . CAD S/P percutaneous coronary angioplasty 2005   PCI to pLAD 99%; 3.0 mm x 38 mm Cypher DES; EF ~45% - distal, septal apical hypokinesis    . Dyslipidemia, goal LDL below 70   . Essential hypertension   . GERD (gastroesophageal reflux disease)   . 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 Insomnia     Past Surgical History:  Procedure Laterality Date  . APPENDECTOMY  12/1968     at same time as her ovarian cyst surgery  . CESAREAN SECTION  05/18/1983  . CORONARY ANGIOPLASTY WITH STENT PLACEMENT  2005   prox LAD 3.0 mm x 28 mm Cypher DES; circumflex is nondominant with 2 large EOMs. Dominant RCA is a large PDA and posterolateral branch.  . CYSTOSCOPY W/ URETERAL STENT PLACEMENT Right 04/15/2020   Procedure: CYSTOSCOPY WITH RETROGRADE PYELOGRAM/URETERAL STENT PLACEMENT;  Surgeon: Raynelle Bring, MD;  Location: [facility] ORS;  Service: Urology;  Laterality: Right;  . NM MYOVIEW LTD  10/2012   8:50 min; 9 METS.  No ischemia or Infarction; EF ~68%  . OVARIAN CYST REMOVAL  12/1968  . TRANSURETHRAL RESECTION OF BLADDER TUMOR N/A 04/15/2020   Procedure: bladder biopsy with fulguration of bleeders;  Surgeon: Raynelle Bring, MD;  Location: [facility] ORS;  Service: Urology;  Laterality: N/A;    Allergies: Shrimp [shellfish allergy], Crestor [rosuvastatin], Pravastatin, Vytorin [ezetimibe-simvastatin], and Wasp venom  Medications: Prior to Admission medications   Medication Sig Start Date End Date Taking? Authorizing Provider  acetaminophen (TYLENOL) 500 MG tablet Take 500-1,000 mg by mouth every 6 (six) hours as needed for mild pain or moderate pain.   Yes [provider]  hydrochlorothiazide (MICROZIDE) 12.5 MG capsule TAKE 1 CAPSULE BY MOUTH DAILY Patient taking differently: Take 12.5 mg by mouth daily.  03/07/20  Yes [name], MD  Ibuprofen-Acetaminophen (ADVIL DUAL ACTION) 125-250 MG TABS Take 2 tablets by mouth every 8 (eight) hours as needed (pain).   Yes [provider]  lisinopril (ZESTRIL) 20 MG tablet TAKE 1 TABLET BY MOUTH DAILY Patient taking differently: Take 20 mg by mouth daily.  03/07/20  Yes Ellyn Hack,  Leonie Green, MD  metoprolol succinate (TOPROL-XL) 25 MG 24 hr tablet Take 1 tablet (25 mg total) by mouth daily. 09/08/19  Yes [name], MD  nitroGLYCERIN (NITROSTAT) 0.4 MG SL tablet TAKE 1 TABLET UNDER THE TONGUE AS NEEDEDFOR CHEST PAIN UP TO 3 TIMES AN  EPISODE Patient taking differently: Place 0.4 mg under the tongue every 5 (five) minutes x 3 doses as needed for chest pain.  06/16/18  Yes [name], MD  Alirocumab (PRALUENT) 150 MG/ML SOAJ Inject 150 mg into the skin every 14 (fourteen) days. Patient not taking: Reported on 04/13/2020 06/02/19   [name], MD     Family History  Problem Relation Age of Onset  . Stroke Mother   . Hypertension Mother   . Hyperlipidemia Father   . Cancer Maternal Grandmother        unsure type, thinks may be gyn  . Hyperlipidemia Son        not known, pt assumes because of poor diet  . Breast cancer Neg Hx   . Colon cancer Neg Hx     Social History   Socioeconomic History  . Marital status: Widowed    Spouse name: Not on file  . Number of children: Not on file  . Years of education: Not on file  . Highest education level: Not on file  Occupational History  . Occupation: retired  Tobacco Use  . Smoking status: Former Smoker    Quit date: 07/03/2004    Years since quitting: 15.8  . Smokeless tobacco: Never Used  Substance and Sexual Activity  . Alcohol use: No  . Drug use: No  . Sexual activity: Not Currently  Other Topics Concern  . Not on file  Social History Narrative   Widowed.   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   Has been quite active, but no routine exercise.   Quit Smoking in 2005 before her MI.  Does not drink alcohol.   Social Determinants of Health   Financial Resource Strain:   . Difficulty of Paying Living Expenses: Not on file  Food Insecurity:   . Worried About Charity fundraiser in the Last Year: Not on file  . Ran Out of Food in the Last Year: Not on file  Transportation Needs:   . Lack of Transportation (Medical): Not on file  . Lack of Transportation (Non-Medical): Not on file  Physical Activity:   . Days of Exercise per Week: Not on file  . Minutes of Exercise per Session: Not  on file  Stress:   . Feeling of Stress : Not on file  Social Connections:   . Frequency of Communication with Friends and Family: Not on file  . Frequency of Social Gatherings with Friends and Family: Not on file  . Attends Religious Services: Not on file  . Active Member of Clubs or Organizations: Not on file  . Attends Archivist Meetings: Not on file  . Marital Status: Not on file     Review of Systems: A 12 point ROS discussed and pertinent positives are indicated in the HPI above.  All other systems are negative.  Review of Systems  Constitutional: Positive for fatigue. Negative for fever.  Respiratory: Negative for cough and shortness of breath.   Cardiovascular: Negative for chest pain.  Gastrointestinal: Positive for abdominal pain, nausea and vomiting.  Genitourinary: Negative for dysuria.  Musculoskeletal:  Negative for back pain.  Psychiatric/Behavioral: Negative for behavioral problems and confusion.    Vital Signs: BP (!) 83/55 (BP Location: Left Arm)   Pulse 95   Temp 97.8 F (36.6 C) (Oral)   Resp 19   Ht 5\' 4"  (1.626 m)   Wt 231 lb 7.7 oz (105 kg)   LMP  (LMP Unknown)   SpO2 97%   BMI 39.73 kg/m   Physical Exam Vitals and nursing note reviewed.  Constitutional:      General: She is not in acute distress.    Appearance: Normal appearance. She is ill-appearing.  HENT:     Mouth/Throat:     Mouth: Mucous membranes are moist.     Pharynx: Oropharynx is clear.  Cardiovascular:     Rate and Rhythm: Normal rate and regular rhythm.  Pulmonary:     Effort: Pulmonary effort is normal.     Breath sounds: Normal breath sounds.  Abdominal:     General: Abdomen is flat. There is no distension.     Palpations: Abdomen is soft.  Skin:    General: Skin is warm and dry.  Neurological:     General: No focal deficit present.     Mental Status: She is alert and oriented to person, place, and time. Mental status is at baseline.  Psychiatric:        Mood  and Affect: Mood normal.        Behavior: Behavior normal.        Thought Content: Thought content normal.        Judgment: Judgment normal.      MD Evaluation Airway: WNL Heart: WNL Abdomen: WNL Chest/ Lungs: WNL ASA  Classification: 3 Mallampati/Airway Score: One   Imaging: CT ABDOMEN WO CONTRAST  Result Date: 05/02/2020 CLINICAL DATA:  Evaluate anatomy prior to potential percutaneous gastrostomy tube placement. EXAM: CT ABDOMEN WITHOUT CONTRAST TECHNIQUE: Multidetector CT imaging of the abdomen was performed following the standard protocol without IV contrast. COMPARISON:  CT abdomen pelvis - 04/14/2020; abdominal radiograph-05/01/2020; 04/29/2020; 04/27/2020 FINDINGS: Lack of intravenous contrast limits the ability to evaluate solid abdominal organs. Lower chest: Limited visualization of the lower thorax demonstrates small to moderate-sized bilateral pleural effusions with associated bibasilar consolidative opacities, right greater than left. Borderline cardiomegaly.  Small pericardial effusion. Hepatobiliary: There is mild nodularity of the hepatic contour suggestive of cirrhotic change. Normal noncontrast appearance of the gallbladder given degree distention. No radiopaque gallstones. Moderate volume intra-abdominal ascites. Pancreas: Normal noncontrast appearance of the pancreas. Spleen: Normal noncontrast appearance of the spleen. Adrenals/Urinary Tract: Post right-sided ureteral stent placement. The right kidney is slightly atrophic in comparison to the left. No renal stones. There is a minimal amount likely age and body habitus related bilateral perinephric stranding. No urine obstruction. Normal noncontrast appearance the bilateral adrenal glands. The urinary bladder was not imaged. Stomach/Bowel: The anterior wall of the gastric antrum is well apposed against the ventral wall of the upper abdomen without interposition of the hepatic parenchyma or transverse colon. Enteric tube tip  terminates within the descending portion of the duodenum. There is moderate distension majority of the visualized loops of large and small bowel, similar to abdominal radiographs, incompletely evaluated though potentially the sequela of ileus. No pneumoperitoneum, pneumatosis or portal venous gas. Vascular/Lymphatic: Moderate amount of atherosclerotic plaque within a normal caliber abdominal aorta. No bulky retroperitoneal or mesenteric adenopathy on this noncontrast examination. Other: Moderate to large amount of diffuse body wall anasarca, most conspicuous about the midline of the  low back and bilateral flanks. Musculoskeletal: No acute or aggressive osseous abnormalities. Stigmata of dish within the lower thoracic spine. IMPRESSION: 1. Despite amenable gastric anatomy, the presence moderate volume intra-abdominal ascites renders the patient a poor candidate for gastrostomy tube placement. 2. Suspected ileus, incompletely evaluated. 3. Moderate sized bilateral pleural effusions with associated bibasilar atelectasis, right greater than left. 4. Small pericardial effusion. Further evaluation cardiac echo could be performed as indicated. 5.  Aortic Atherosclerosis (ICD10-I70.0). 6. Post right-sided ureteral stent placement with mild asymmetric atrophy of the right kidney comparison to left. No evidence of urinary obstruction. Electronically Signed   By: Sandi Mariscal M.D.   On: 05/02/2020 08:14   DG Chest 1 View  Result Date: 04/30/2020 CLINICAL DATA:  Shortness of breath and cough. EXAM: CHEST  1 VIEW COMPARISON:  Chest radiograph dated 04/28/2020. FINDINGS: The heart size and mediastinal contours are within normal limits. Opacity of the right hemithorax likely reflects a layering pleural effusion with associated atelectasis. A small left pleural effusion with associated atelectasis is noted. Mild-to-moderate bibasilar airspace opacities likely contribute. Mild diffuse bilateral interstitial opacities are noted.  There is no pneumothorax. A right upper extremity peripherally inserted central venous catheter tip overlies the superior vena cava. An enteric tube enters the stomach and terminates below the field of view. IMPRESSION: Bilateral pleural effusions with associated atelectasis and/or airspace opacities. Mild diffuse bilateral interstitial opacities may represent pulmonary edema or infection. Electronically Signed   By: Zerita Boers M.D.   On: 04/30/2020 15:04   DG Chest 1 View  Result Date: 04/20/2020 CLINICAL DATA:  Increasing short of breath EXAM: CHEST  1 VIEW COMPARISON:  04/13/2020, CT 04/15/2020 FINDINGS: Right upper extremity central venous catheter tip over the SVC. Cardiomegaly with vascular congestion and diffuse interstitial opacity consistent with edema. Moderate right-sided pleural effusion, likely layering and resulting in hazy asymmetric appearance of the right thorax. At least small left pleural effusion. Bibasilar consolidations. No pneumothorax. IMPRESSION: 1. Cardiomegaly with vascular congestion and diffuse interstitial pulmonary edema. 2. Bilateral pleural effusions, at least moderate on the right and small on the left. 3. Bibasilar consolidations Electronically Signed   By: Donavan Foil M.D.   On: 04/20/2020 22:20   DG Chest 2 View  Result Date: 04/13/2020 CLINICAL DATA:  Weakness diarrhea EXAM: CHEST - 2 VIEW COMPARISON:  07/28/2004 FINDINGS: Heart size upper normal.  Vascularity normal.  Coronary stent noted. Small bilateral pleural effusions. Mild bibasilar atelectasis/infiltrate. IMPRESSION: Interval development of mild bibasilar airspace disease and small pleural effusions. Negative for heart failure. Electronically Signed   By: Franchot Gallo M.D.   On: 04/13/2020 13:57   DG Abd 1 View  Result Date: 05/03/2020 CLINICAL DATA:  Nasogastric tube placement EXAM: ABDOMEN - 1 VIEW COMPARISON:  Portable exam 1214 hours compared 05/02/2020 FINDINGS: Tip of nasogastric tube projects  over mid stomach. Upper normal heart size with pulmonary vascular congestion. Mediastinal contours normal. RIGHT arm PICC line tip projects over SVC. LEFT lower lobe atelectasis versus consolidation. Moderate RIGHT pleural effusion and basilar atelectasis. Gas in colon. RIGHT ureteral stent visualized. IMPRESSION: Tip of nasogastric tube projects over mid stomach. Persistent LEFT lower lobe atelectasis versus consolidation. Moderate RIGHT pleural effusion. Electronically Signed   By: Lavonia Dana M.D.   On: 05/03/2020 12:48   DG Abd 1 View  Result Date: 05/01/2020 CLINICAL DATA:  Follow-up ileus EXAM: ABDOMEN - 1 VIEW COMPARISON:  04/29/2020 abdominal radiograph FINDINGS: Enteric tube terminates in the descending duodenum. Stable right nephroureteral stent. Mildly  dilated small bowel loops in right abdomen appears slightly increased. Moderate gaseous distention of the colon appears similar. No evidence of pneumatosis or pneumoperitoneum. No radiopaque nephrolithiasis. IMPRESSION: 1. Enteric tube terminates in the descending duodenum. 2. Mildly dilated small bowel loops in right abdomen appear slightly increased. Stable moderate gaseous distention of the colon. Findings are compatible with persistent adynamic ileus. Electronically Signed   By: Ilona Sorrel M.D.   On: 05/01/2020 10:05   DG Abd 1 View  Result Date: 04/29/2020 CLINICAL DATA:  Follow-up ileus EXAM: ABDOMEN - 1 VIEW COMPARISON:  April 27, 2020 FINDINGS: A right ureteral stent is stable. In NG tube is identified. I suspect the distal tip may be in the proximal duodenum. Air-filled prominent colon remains. No small bowel dilatation. No other acute abnormalities. IMPRESSION: 1. Support apparatus as above. 2. Continued air-filled prominent colon may represent ileus. No other abnormalities. Electronically Signed   By: Dorise Bullion III M.D   On: 04/29/2020 09:40   DG Abd 1 View  Result Date: 04/27/2020 CLINICAL DATA:  Obstruction EXAM: ABDOMEN -  1 VIEW COMPARISON:  Portable exam 1047 hours compared 04/22/2020 FINDINGS: RIGHT ureteral stent unchanged. Gaseous distention of colon from tip of cecum through splenic flexure. Absent gas within descending and rectosigmoid colon. Small bowel gas pattern normal. No bowel wall thickening or pathologic calcification. IMPRESSION: Mild gaseous distention of the proximal half the colon with absent gas distally, could reflect ileus but unable to exclude distal colonic obstruction with this appearance. Electronically Signed   By: Lavonia Dana M.D.   On: 04/27/2020 11:01   CT CHEST W CONTRAST  Result Date: 04/15/2020 CLINICAL DATA:  Cancer of unknown primary, staging. EXAM: CT CHEST WITH CONTRAST TECHNIQUE: Multidetector CT imaging of the chest was performed during intravenous contrast administration. CONTRAST:  32mL OMNIPAQUE IOHEXOL 300 MG/ML  SOLN COMPARISON:  None. FINDINGS: Cardiovascular: There is mild calcification of the aortic arch. Normal heart size. No pericardial effusion. A coronary artery stent is seen. Mediastinum/Nodes: No enlarged mediastinal, hilar, or axillary lymph nodes. The thyroid gland and trachea demonstrate no significant findings. There is a small hiatal hernia. Lungs/Pleura: Mild bilateral lower lobe atelectasis is seen, right slightly greater than left. Very mild slightly nodular appearing scarring and/or atelectasis is seen along the posterior aspect of the inferior left upper lobe. There is a small left pleural effusion. A moderate sized right pleural effusion is also noted. No pneumothorax is identified. Upper Abdomen: A moderate amount of abdominal free fluid is seen. There is moderate to marked severity right-sided hydronephrosis with delayed renal cortical enhancement involving the right kidney. Musculoskeletal: No chest wall abnormality. No acute or significant osseous findings. Degenerative changes seen throughout the thoracic spine. IMPRESSION: 1. Small left pleural effusion with a  moderate sized right pleural effusion. 2. Mild bilateral lower lobe atelectasis, right slightly greater than left. 3. Very mild slightly nodular appearing scarring and/or atelectasis along the posterior aspect of the inferior left upper lobe. 4. Moderate amount of abdominal free fluid. 5. Moderate to marked severity right-sided hydronephrosis with delayed renal cortical enhancement involving the right kidney. This is suggestive of partial renal obstruction. 6. Small hiatal hernia. 7. Aortic atherosclerosis. Aortic Atherosclerosis (ICD10-I70.0). Electronically Signed   By: Virgina Norfolk M.D.   On: 04/15/2020 15:38   US Paracentesis  Result Date: 05/02/2020 INDICATION: Patient with history of stage IV cervical cancer/adenocarcinoma, carcinomatosis, ascites. Request made for diagnostic and therapeutic paracentesis. EXAM: ULTRASOUND GUIDED DIAGNOSTIC AND THERAPEUTIC PARACENTESIS MEDICATIONS: 1% lidocaine to  skin and subcutaneous tissue COMPLICATIONS: None immediate. PROCEDURE: Informed written consent was obtained from the patient after a discussion of the risks, benefits and alternatives to treatment. A timeout was performed prior to the initiation of the procedure. Initial ultrasound scanning demonstrates a large amount of ascites within the right lower abdominal quadrant. The right lower abdomen was prepped and draped in the usual sterile fashion. 1% lidocaine was used for local anesthesia. Following this, a 19 gauge, 10-cm, Yueh catheter was introduced. An ultrasound image was saved for documentation purposes. The paracentesis was performed. The catheter was removed and a dressing was applied. The patient tolerated the procedure well without immediate post procedural complication. FINDINGS: A total of approximately 3.7 liters of slightly hazy, amber fluid was removed. Samples were sent to the laboratory as requested by the clinical team. IMPRESSION: Successful ultrasound-guided diagnostic and therapeutic  paracentesis yielding 3.7 liters of peritoneal fluid. Read by: Rowe Robert, PA-C Electronically Signed   By: Jerilynn Mages.  Shick M.D.   On: 05/02/2020 11:15   DG Chest Port 1 View  Result Date: 05/03/2020 CLINICAL DATA:  Status post right thoracentesis. EXAM: PORTABLE CHEST 1 VIEW COMPARISON:  04/30/2020 FINDINGS: Decreased right pleural effusion, no longer apparent on the right. Similar small left pleural effusion. No discernible pneumothorax on this semi erect study. Overlying left basilar opacity. Low lung volumes. Gastric tube courses below the diaphragm with the tip likely in the stomach. Similar cardiac silhouette. Right PICC with the tip projecting at the distal SVC. IMPRESSION: 1. Decreased right pleural effusion, no longer apparent. No discernible pneumothorax. 2. Similar small left pleural effusion. Overlying left basilar opacity, which may represent atelectasis, aspiration, or pneumonia. Electronically Signed   By: Margaretha Sheffield MD   On: 05/03/2020 13:50   DG Chest Port 1 View  Result Date: 04/28/2020 CLINICAL DATA:  64 year old female with persistent cough. Query fluid overload. EXAM: PORTABLE CHEST 1 VIEW COMPARISON:  Portable chest 04/20/2020 and earlier. FINDINGS: Portable AP semi upright view at 1214 hours. Stable right PICC line. Enteric tube has been placed and courses to the stomach, tip not included. Improved bilateral ventilation, but continued basilar predominant increased interstitial opacity, and dense retrocardiac opacity. Veiling opacity on the right is compatible with a superimposed pleural effusion. No pneumothorax. Upper lung pulmonary vascularity appears more normal now. Mediastinal contours are stable and within normal limits. Dense retrocardiac opacity without air bronchograms. Paucity of bowel gas. No acute osseous abnormality identified. IMPRESSION: 1. Suspect regression of pulmonary edema since 04/20/2020, but with residual vascular congestion, small right pleural effusion, and  superimposed left lower lobe collapse or consolidation. 2. Enteric tube placed into the stomach, tip not included. Electronically Signed   By: Genevie Ann M.D.   On: 04/28/2020 12:35   DG Abd 2 Views  Result Date: 05/02/2020 CLINICAL DATA:  Weakness, diarrhea EXAM: ABDOMEN - 2 VIEW COMPARISON:  05/01/2020 FINDINGS: Nasogastric tube likely extends to the second portion of the duodenum with its proximal side hole in the region of the pylorus. This is unchanged from prior examination. Dilated loops of small bowel are again seen within the mid abdomen and gas is seen within multiple nondilated loops of large bowel in keeping with changes of an underlying of partial small bowel obstruction or ileus. No gross free intraperitoneal gas. Moderate right pleural effusion again noted. Right double-J ureteral stent overlies its expected position. Pelvis excluded from view. IMPRESSION: 1. Findings consistent with an underlying partial small bowel obstruction or ileus, similar to prior examination. 2.  Nasogastric tube tip in the second portion of the duodenum. Electronically Signed   By: Fidela Salisbury MD   On: 05/02/2020 19:27   DG Abd Portable 1V  Result Date: 04/27/2020 CLINICAL DATA:  Nasogastric tube placement EXAM: PORTABLE ABDOMEN - 1 VIEW COMPARISON:  04/27/2020 FINDINGS: Interval placement of esophagogastric tube, tip and side port below the diaphragm, looped in the gastric fundus. Diffusely distended colon similar to prior examination. Partially imaged right double-J ureteral stent. IMPRESSION: 1. Interval placement of esophagogastric tube, tip and side port below the diaphragm, looped in the gastric fundus. 2. Diffusely distended colon similar to prior examination. Electronically Signed   By: Eddie Candle M.D.   On: 04/27/2020 13:33   DG Abd Portable 2V  Result Date: 04/22/2020 CLINICAL DATA:  Decreased bowel sounds EXAM: PORTABLE ABDOMEN - 2 VIEW COMPARISON:  04/21/2020 FINDINGS: Supine frontal views of the  abdomen and pelvis are obtained. Right ureteral stent is stable. There is increasing gaseous distention of the colon, most compatible with ileus. No evidence of small-bowel obstruction. No masses or abnormal calcifications. IMPRESSION: 1. Progressive gaseous distention of the colon most consistent with ileus. 2. Stable right ureteral stent. Electronically Signed   By: Randa Ngo M.D.   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 Hypoactive bowel sounds. EXAM: PORTABLE ABDOMEN - 2 VIEW COMPARISON:  KUB 04/20/2020.  CT Abdomen and Pelvis 04/14/2020. FINDINGS: Upright and supine views of the abdomen and pelvis. Stable right double-J ureteral stent. Mildly to moderately gas distended large bowel again noted, with gas present to the mid sigmoid. As before, no dilated small bowel loops. But gas is increased since 04/14/2020. No pneumoperitoneum. Stable lung bases, right pleural effusion again evident. Stable visualized osseous structures. IMPRESSION: 1. Continued gas distended colon to the sigmoid, although no dilated small bowel to strongly suggest mechanical obstruction. Continue to favor ileus. 2. No free air. Stable right ureteral stent. Persistent right pleural effusion. Electronically Signed   By: Genevie Ann M.D.   On: 04/21/2020 13:54   DG Abd Portable 2V  Result Date: 04/20/2020 CLINICAL DATA:  Hypoactive bowel sounds. EXAM: PORTABLE ABDOMEN - 2 VIEW COMPARISON:  No prior. FINDINGS: Double-J right ureteral stent in good anatomic position. Colon is slightly distended. Cecum measures 8.9 cm in diameter. Although these findings most likely represent colonic ileus follow-up exam suggested to exclude colonic obstruction. No small bowel distention. No free air. Bibasilar atelectasis/infiltrates. Small right pleural effusion. IMPRESSION: 1.  Double-J right ureteral stent in good anatomic position. 2.  Colon is slightly distended. Cecum measures 8.9 cm. Although these findings most likely represent colonic ileus, follow-up exam suggested to exclude colonic obstruction. No small bowel distention. No free air. 3.  Bibasilar atelectasis/infiltrates. Electronically Signed   By: Marcello Moores  Register   On: 04/20/2020 13:00   DG C-Arm 1-60 Min-No Report  Result Date: 04/15/2020 Fluoroscopy was utilized by the requesting physician.  No radiographic interpretation.   CT RENAL STONE STUDY  Result Date: 04/14/2020 CLINICAL DATA:  64 year old female with hematuria. EXAM: CT ABDOMEN AND PELVIS WITHOUT CONTRAST TECHNIQUE: Multidetector CT imaging of the abdomen and pelvis was performed following the standard protocol without IV contrast. COMPARISON:  None. FINDINGS: Evaluation of this exam is limited in the absence of intravenous contrast. Lower chest: Partially visualized moderate right and small left pleural effusions. There is associated partial compressive atelectasis of the lower lobes.  Pneumonia is not excluded. Clinical correlation is recommended. There is coronary vascular calcification. Partially visualized trace pericardial effusion. There is no intra-abdominal free air. Small ascites. Hepatobiliary: No focal liver abnormality is seen. No gallstones, gallbladder wall thickening, or biliary dilatation. Pancreas: Unremarkable. No pancreatic ductal dilatation or surrounding inflammatory changes. Spleen: Normal in size without focal abnormality. Adrenals/Urinary Tract: The adrenal glands unremarkable. The left kidney is unremarkable. There is a moderate right hydronephrosis with mild right hydroureter. No stone identified. There is a transition point in the distal right ureter or right UVJ. The urinary bladder is minimally distended. There is possible mild nodular thickening of the posterior bladder wall adjacent to the right UVJ (77/2). There is loss of fat plane between the anterior lower uterus/cervix and posterior  bladder wall. The lower uterus/cervical region is somewhat enlarged. Findings concerning for a neoplastic process either arising from the bladder urothelium or from the cervix and invading into the posterior bladder wall with obstruction of the right UVJ or distal right ureter. Stomach/Bowel: There is a small hiatal hernia. There is no bowel obstruction. There is sigmoid diverticulosis. The appendix is not visualized with certainty. No inflammatory changes identified in the right lower quadrant. Vascular/Lymphatic: Moderate aortoiliac atherosclerotic disease. The IVC is unremarkable. No portal venous gas. There is no adenopathy. Reproductive: The uterus is anteverted. Enlargement of the lower uterus/cervical region as described above. Other: There is diffuse omental nodularity and caking consistent with metastatic disease. Musculoskeletal: Osteopenia. No acute osseous pathology. IMPRESSION: 1. Findings concerning for a neoplastic process either arising from the bladder urothelium or from the cervix and invading into the posterior bladder wall with obstruction of the right UVJ or distal right ureter. There is associated moderate right hydronephrosis. Sampling of the ascitic fluid may provide further diagnostic information. 2. Diffuse omental nodularity and caking consistent with metastatic disease. 3. Moderate right and small left pleural effusions with associated partial compressive atelectasis of the lower lobes. Pneumonia is not excluded. Clinical correlation is recommended. 4. Sigmoid diverticulosis. No bowel obstruction. 5. Aortic Atherosclerosis (ICD10-I70.0). Electronically Signed   By: Anner Crete M.D.   On: 04/14/2020 17:44   [place] EKG SITE RITE  Result Date: 04/18/2020 If Site Rite image not attached, placement could not be confirmed due to current cardiac rhythm.  US THORACENTESIS ASP PLEURAL SPACE W/IMG GUIDE  Result Date: 05/03/2020 INDICATION: Patient with metastatic gynecologic cancer with  carcinomatosis, ascites, and now right pleural effusion. Request made for diagnostic and therapeutic right thoracentesis. EXAM: ULTRASOUND GUIDED DIAGNOSTIC AND THERAPEUTIC RIGHT THORACENTESIS MEDICATIONS: 10 mL 1% lidocaine COMPLICATIONS: None immediate. PROCEDURE: An ultrasound guided thoracentesis was thoroughly discussed with the patient and questions answered. The benefits, risks, alternatives and complications were also discussed. The patient understands and wishes to proceed with the procedure. Written consent was obtained. Ultrasound was performed to localize and mark an adequate pocket of fluid in the right chest. The area was then prepped and draped in the normal sterile fashion. 1% Lidocaine was used for local anesthesia. Under ultrasound guidance a 6 Fr Safe-T-Centesis catheter was introduced. Thoracentesis was performed. The catheter was removed and a dressing applied. FINDINGS: A total of approximately 1.1 liters of clear, yellow fluid was removed. Samples were sent to the laboratory as requested by the clinical team. IMPRESSION: Successful ultrasound guided diagnostic and therapeutic right thoracentesis yielding 1.1 liters of pleural fluid. Read by: Brynda Greathouse PA-C Electronically Signed   By: Sandi Mariscal M.D.   On: 05/03/2020 14:49    Labs:  CBC:  Recent Labs    04/28/20 0604 04/28/20 0604 04/29/20 0829 05/01/20 0408 05/02/20 1500 05/03/20 0405  WBC 0.8*  --  1.3* 5.0 6.5  --   HGB 9.9*   < > 10.0* 7.7* 7.2* 8.8*  HCT 31.1*   < > 31.9* 24.2* 22.2* 26.8*  PLT 83*  --  87* 77* 79*  --    < > = values in this interval not displayed.    COAGS: Recent Labs    04/16/20 0554  INR 1.1    BMP: Recent Labs    04/30/20 0952 05/01/20 0408 05/02/20 0337 05/03/20 0405  NA 132* 137 137 139  K 5.6* 4.1 3.9 3.8  CL 102 109 106 108  CO2 18* 20* 20* 22  GLUCOSE 952* 134* 129* 135*  BUN 53* 77* 85* 100*  CALCIUM 8.5* 8.2* 8.3* 8.1*  CREATININE 1.38* 1.62* 1.89* 1.92*    GFRNONAA 40* 33* 27* 27*  GFRAA 46* 38* 31* 31*    LIVER FUNCTION TESTS: Recent Labs    04/28/20 0604 05/01/20 0408 05/02/20 0337 05/03/20 0405  BILITOT 0.5 0.4 0.5 0.7  AST 23 39 31 25  ALT 13 25 28 25   ALKPHOS 47 70 70 65  PROT 5.3* 5.0* 5.0* 4.9*  ALBUMIN 2.3* 2.0* 1.9* 1.7*    TUMOR MARKERS: No results for input(s): AFPTM, CEA, CA199, CHROMGRNA in the last 8760 hours.  Assessment and Plan: Stage IV cervical cancer, now with malignant obstruction  NGT in place, requiring recent adjustments with patient vomiting around the tube when not in good position.  In need of venting gastrostomy for relief of her symptoms.  Discussed with Dr. Berline Lopes, Dr. Pascal Lux, and Dr. Kathlene Cote today. Concerns noted including, patient at risk for complication with procedure including leakage of ascites, increased risk of bleeding, tumor seeding, soft vital signs with intermittent hypotension, risk of sedation vs. Anesthesia.  Plan made to reassess tomorrow morning with consideration for possible gastrostomy tube placement in IR.   Discussed with patient this afternoon.  She was previously hesitant with surgery team yesterday, but is agreeable today.  NPO with TPN infusing at present. NGT to suction.  Not currently on blood thinners.   During thoracentesis this AM, she was noted to have soft BPs, 86/52, 78/62 but was asymptomatic and mentating well.  Monitor vital signs tomorrow.  Obtain INR.  Currently getting infusion of albumin with BP 102/55 this afternoon.   Thank you for this interesting consult.  I greatly enjoyed meeting TEOFILA BOWERY and look forward to participating in their care.  A copy of this report was sent to the requesting provider on this date.  Electronically Signed: Docia Barrier, PA 05/03/2020, 4:56 PM   I spent a total of 40 Minutes    in face to face in clinical consultation, greater than 50% of which was counseling/coordinating care for malignant bowel  obstruction.

## 2020-05-03 NOTE — Progress Notes (Signed)
PT Cancellation Note  Patient Details Name: Eileen Burgess MRN: [id number] DOB: [birth year]   Cancelled Treatment:    Reason Eval/Treat Not Completed: Fatigue/lethargy limiting ability to participate. Pt declined participation on today 2* fatigue-pt wanted to rest.    Doreatha Massed, PT Acute Rehabilitation  Office: [phone] Pager: [phone]

## 2020-05-03 NOTE — Progress Notes (Addendum)
Patient ID: Eileen Burgess, female   DOB: 02-11-1956, 64 y.o.   MRN: [id number]    Progress Note   Subjective   day # 19 CC; new diagnosis stage IV cervical cancer with carcinomatosis, diffuse ileus  On TPN  Paracentesis yesterday-0.7 L removed hazy Thoracentesis ordered for today Abdominal films yesterday-no significant change dilated loops of small bowel in the midabdomen and gas within multiple nondilated loops of large bowel, underlying partial small bowel obstruction or ileus, NG with with tip in the duodenum.  Rectal tube placed yesterday for venting-about 200 cc liquid stool in bag NG tube-1000 cc out w/i past 24 hours   BUN 100/creatinine 1.9 Hemoglobin 8.8  Patient says abdominal pain not quite as bad today after paracentesis.  She is unaware of the results of enemas yesterday.  Relates rectal tube is mildly uncomfortable and continues to have discomfort from NG tube   Objective   Vital signs in last 24 hours: Temp:  [97.6 F (36.4 C)-99.8 F (37.7 C)] 97.6 F (36.4 C) (09/01 0542) Pulse Rate:  [86-104] 93 (09/01 0542) Resp:  [18-22] 20 (09/01 0542) BP: (90-108)/(46-66) 99/64 (09/01 0542) SpO2:  [88 %-98 %] 92 % (09/01 0542) Last BM Date: 05/03/20 (has rectal tube) General: Older   white female in NAD, ill-appearing  heart:  Regular rate and rhythm; no murmurs Lungs: Respirations even and unlabored, lungs CTA bilaterally Abdomen: Remains fairly distended and tight, less tympanic, rather diffusely tender, bowel sounds decreased Extremities:  Without edema. Neurologic:  Alert and oriented,  grossly normal neurologically. Psych:  Cooperative. Normal mood and affect.  Intake/Output from previous day: 08/31 0701 - 09/01 0700 In: 1761.1 [I.V.:1131.1; Blood:630] Out: 4782 [Urine:850; Emesis/NG output:925; Stool:75] Intake/Output this shift: No intake/output data recorded.  Lab Results: Recent Labs    05/01/20 0408 05/02/20 1500 05/03/20 0405  WBC 5.0 6.5  --    HGB 7.7* 7.2* 8.8*  HCT 24.2* 22.2* 26.8*  PLT 77* 79*  --    BMET Recent Labs    05/01/20 0408 05/02/20 0337 05/03/20 0405  NA 137 137 139  K 4.1 3.9 3.8  CL 109 106 108  CO2 20* 20* 22  GLUCOSE 134* 129* 135*  BUN 77* 85* 100*  CREATININE 1.62* 1.89* 1.92*  CALCIUM 8.2* 8.3* 8.1*   LFT Recent Labs    05/03/20 0405  PROT 4.9*  ALBUMIN 1.7*  AST 25  ALT 25  ALKPHOS 65  BILITOT 0.7   PT/INR No results for input(s): LABPROT, INR in the last 72 hours.  Studies/Results: CT ABDOMEN WO CONTRAST  Result Date: 05/02/2020 CLINICAL DATA:  Evaluate anatomy prior to potential percutaneous gastrostomy tube placement. EXAM: CT ABDOMEN WITHOUT CONTRAST TECHNIQUE: Multidetector CT imaging of the abdomen was performed following the standard protocol without IV contrast. COMPARISON:  CT abdomen pelvis - 04/14/2020; abdominal radiograph-05/01/2020; 04/29/2020; 04/27/2020 FINDINGS: Lack of intravenous contrast limits the ability to evaluate solid abdominal organs. Lower chest: Limited visualization of the lower thorax demonstrates small to moderate-sized bilateral pleural effusions with associated bibasilar consolidative opacities, right greater than left. Borderline cardiomegaly.  Small pericardial effusion. Hepatobiliary: There is mild nodularity of the hepatic contour suggestive of cirrhotic change. Normal noncontrast appearance of the gallbladder given degree distention. No radiopaque gallstones. Moderate volume intra-abdominal ascites. Pancreas: Normal noncontrast appearance of the pancreas. Spleen: Normal noncontrast appearance of the spleen. Adrenals/Urinary Tract: Post right-sided ureteral stent placement. The right kidney is slightly atrophic in comparison to the left. No renal stones. There is a  minimal amount likely age and body habitus related bilateral perinephric stranding. No urine obstruction. Normal noncontrast appearance the bilateral adrenal glands. The urinary bladder was not  imaged. Stomach/Bowel: The anterior wall of the gastric antrum is well apposed against the ventral wall of the upper abdomen without interposition of the hepatic parenchyma or transverse colon. Enteric tube tip terminates within the descending portion of the duodenum. There is moderate distension majority of the visualized loops of large and small bowel, similar to abdominal radiographs, incompletely evaluated though potentially the sequela of ileus. No pneumoperitoneum, pneumatosis or portal venous gas. Vascular/Lymphatic: Moderate amount of atherosclerotic plaque within a normal caliber abdominal aorta. No bulky retroperitoneal or mesenteric adenopathy on this noncontrast examination. Other: Moderate to large amount of diffuse body wall anasarca, most conspicuous about the midline of the low back and bilateral flanks. Musculoskeletal: No acute or aggressive osseous abnormalities. Stigmata of dish within the lower thoracic spine. IMPRESSION: 1. Despite amenable gastric anatomy, the presence moderate volume intra-abdominal ascites renders the patient a poor candidate for gastrostomy tube placement. 2. Suspected ileus, incompletely evaluated. 3. Moderate sized bilateral pleural effusions with associated bibasilar atelectasis, right greater than left. 4. Small pericardial effusion. Further evaluation cardiac echo could be performed as indicated. 5.  Aortic Atherosclerosis (ICD10-I70.0). 6. Post right-sided ureteral stent placement with mild asymmetric atrophy of the right kidney comparison to left. No evidence of urinary obstruction. Electronically Signed   By: Sandi Mariscal M.D.   On: 05/02/2020 08:14   DG Abd 1 View  Result Date: 05/01/2020 CLINICAL DATA:  Follow-up ileus EXAM: ABDOMEN - 1 VIEW COMPARISON:  04/29/2020 abdominal radiograph FINDINGS: Enteric tube terminates in the descending duodenum. Stable right nephroureteral stent. Mildly dilated small bowel loops in right abdomen appears slightly increased.  Moderate gaseous distention of the colon appears similar. No evidence of pneumatosis or pneumoperitoneum. No radiopaque nephrolithiasis. IMPRESSION: 1. Enteric tube terminates in the descending duodenum. 2. Mildly dilated small bowel loops in right abdomen appear slightly increased. Stable moderate gaseous distention of the colon. Findings are compatible with persistent adynamic ileus. Electronically Signed   By: Ilona Sorrel M.D.   On: 05/01/2020 10:05   US Paracentesis  Result Date: 05/02/2020 INDICATION: Patient with history of stage IV cervical cancer/adenocarcinoma, carcinomatosis, ascites. Request made for diagnostic and therapeutic paracentesis. EXAM: ULTRASOUND GUIDED DIAGNOSTIC AND THERAPEUTIC PARACENTESIS MEDICATIONS: 1% lidocaine to skin and subcutaneous tissue COMPLICATIONS: None immediate. PROCEDURE: Informed written consent was obtained from the patient after a discussion of the risks, benefits and alternatives to treatment. A timeout was performed prior to the initiation of the procedure. Initial ultrasound scanning demonstrates a large amount of ascites within the right lower abdominal quadrant. The right lower abdomen was prepped and draped in the usual sterile fashion. 1% lidocaine was used for local anesthesia. Following this, a 19 gauge, 10-cm, Yueh catheter was introduced. An ultrasound image was saved for documentation purposes. The paracentesis was performed. The catheter was removed and a dressing was applied. The patient tolerated the procedure well without immediate post procedural complication. FINDINGS: A total of approximately 3.7 liters of slightly hazy, amber fluid was removed. Samples were sent to the laboratory as requested by the clinical team. IMPRESSION: Successful ultrasound-guided diagnostic and therapeutic paracentesis yielding 3.7 liters of peritoneal fluid. Read by: Rowe Robert, PA-C Electronically Signed   By: Jerilynn Mages.  Shick M.D.   On: 05/02/2020 11:15   DG Abd 2  Views  Result Date: 05/02/2020 CLINICAL DATA:  Weakness, diarrhea EXAM: ABDOMEN - 2 VIEW  COMPARISON:  05/01/2020 FINDINGS: Nasogastric tube likely extends to the second portion of the duodenum with its proximal side hole in the region of the pylorus. This is unchanged from prior examination. Dilated loops of small bowel are again seen within the mid abdomen and gas is seen within multiple nondilated loops of large bowel in keeping with changes of an underlying of partial small bowel obstruction or ileus. No gross free intraperitoneal gas. Moderate right pleural effusion again noted. Right double-J ureteral stent overlies its expected position. Pelvis excluded from view. IMPRESSION: 1. Findings consistent with an underlying partial small bowel obstruction or ileus, similar to prior examination. 2. Nasogastric tube tip in the second portion of the duodenum. 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  Development of partial small bowel obstruction versus ileus since. This is very likely all secondary to carcinomatosis.  Unfortunately she is continuing to require NG decompression. Abdominal films yesterday showed nondilated colon.-NG in the duodenum, will pull back into the stomach. Rectal tube had been placed yesterday along with enemas for decompression.  She has had some liquid stool out so we will leave tube in today.   Patient status post paracentesis yesterday with some improvement in abdominal discomfort  Surgery has consulted and patient will need eventual venting surgically placed G-tube.   #2 protein calorie malnutrition-on TPN #3 pleural effusions-for thoracentesis today ?   Principal Problem:   Cervical cancer ([place]) Active Problems:   Essential hypertension   UTI (urinary tract infection)   AKI (acute kidney  injury) ([place])   Hyponatremia   Orthostatic hypotension   Hyponatremia syndrome     LOS: 19 days   Amy Esterwood PA-C 05/03/2020, 8:30 AM   I have discussed the case with the PA, and that is the plan I formulated. I personally interviewed and examined the patient.  I also personally examined the most recent CT scan and the KUB image from today.  This ongoing high-grade bowel obstruction resulting from carcinomatosis of her pelvic malignancy has only been temporarily improved with NG tube.  It will recur if NG tube is removed.  I certainly understand and agree with the concerns about post gastrostomy leak or wound breakdown related to hypoalbuminemia and ascites, but there does not appear to be another option for her.  Reconsultation of interventional radiology for palliative venting gastrostomy is warranted, as oncology seems to be indicating in their note of today.  Total time 25 minutes.  Nelida Meuse III Office: [phone]

## 2020-05-03 NOTE — Procedures (Signed)
PROCEDURE SUMMARY:  Successful US guided diagnostic and therapeutic right thoracentesis. Yielded 1.1 liters of clear, yellow fluid. Pt tolerated procedure well. No immediate complications.  Specimen was sent for labs. CXR ordered.  EBL < 5 mL  Docia Barrier PA-C 05/03/2020 1:29 PM

## 2020-05-03 NOTE — Progress Notes (Signed)
Admit: 04/13/2020 LOS: 57  58F recent dx metastatic GYN Ca, prob cervical, AKI, FTT, malignant ileus/SBO  Subjective:  . Out of room at time of my eval, rec thoracentesis, son in room . Stable SCr, K 3.8, HCO3 22 . 0.85L UOP yesterday, 0.9L NG output . Has NGT and rectal tube, only 80mL stool . On TPN . U Na 24 yesterday  08/31 0701 - 09/01 0700 In: 1761.1 [I.V.:1131.1; Blood:630] Out: 2585 [Urine:850; Emesis/NG output:925; Stool:75]  Filed Weights   04/13/20 1234 04/24/20 0830 05/01/20 0838  Weight: 98 kg 98.2 kg 105 kg    Scheduled Meds: . bisacodyl  10 mg Rectal BID  . Chlorhexidine Gluconate Cloth  6 each Topical Daily  . feeding supplement  1 Container Oral TID BM  . insulin aspart  0-9 Units Subcutaneous Q8H  . ipratropium-albuterol  3 mL Nebulization TID  . lidocaine      . pantoprazole (PROTONIX) IV  40 mg Intravenous Q24H  . sodium chloride flush  10-40 mL Intracatheter Q12H   Continuous Infusions: . albumin human    . TPN ADULT (ION) 75 mL/hr at 05/03/20 0521  . TPN ADULT (ION)     PRN Meds:.acetaminophen **OR** acetaminophen, alteplase, alum & mag hydroxide-simeth, guaiFENesin-dextromethorphan, heparin lock flush, heparin lock flush, ipratropium-albuterol, nitroGLYCERIN, ondansetron **OR** ondansetron (ZOFRAN) IV, phenol, prochlorperazine, simethicone, sodium chloride flush, sodium chloride flush, sodium chloride flush  Current Labs: reviewed    Physical Exam:  Blood pressure (!) 78/40, pulse 96, temperature 97.6 F (36.4 C), temperature source Oral, resp. rate 20, height 5\' 4"  (1.626 m), weight 105 kg, SpO2 (!) 88 %. Pt not examined today  A 1. AKI, nl BL, likely multifactorial inpaired renal perfusion, maybe now with ATN; doubt abd compartment syndrome.  Nonoliguric,   2. 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 Pleural efusion for thoracentesis today 4. Ascites, as  above, s/p LVP 8/31 3.7L 5. Anemia / pancytopenia, from CTX, Onc following; improved 6. Acidosis, metabolic, stable 7. Ileus, severe, potentially for G tube  P . Cont supportive care . No extra IVFs, cont TPN . Consider albumin boluses for hypotension . Consider palliative involvement  Pearson Grippe MD 05/03/2020, 1:48 PM  Recent Labs  Lab 05/01/20 0408 05/02/20 0337 05/03/20 0405  NA 137 137 139  K 4.1 3.9 3.8  CL 109 106 108  CO2 20* 20* 22  GLUCOSE 134* 129* 135*  BUN 77* 85* 100*  CREATININE 1.62* 1.89* 1.92*  CALCIUM 8.2* 8.3* 8.1*  PHOS 4.5 4.9* 4.6   Recent Labs  Lab 04/29/20 0829 04/29/20 0829 05/01/20 0408 05/02/20 1500 05/03/20 0405  WBC 1.3*  --  5.0 6.5  --   NEUTROABS 0.3*  --  3.0 4.5  --   HGB 10.0*   < > 7.7* 7.2* 8.8*  HCT 31.9*   < > 24.2* 22.2* 26.8*  MCV 86.7  --  87.1 85.7  --   PLT 87*  --  77* 79*  --    < > = values in this interval not displayed.

## 2020-05-04 ENCOUNTER — Inpatient Hospital Stay (HOSPITAL_COMMUNITY): Payer: Medicare Other

## 2020-05-04 DIAGNOSIS — K56609 Unspecified intestinal obstruction, unspecified as to partial versus complete obstruction: Secondary | ICD-10-CM

## 2020-05-04 HISTORY — PX: IR GASTROSTOMY TUBE MOD SED: IMG625

## 2020-05-04 LAB — CYTOLOGY - NON PAP

## 2020-05-04 LAB — CBC WITH DIFFERENTIAL/PLATELET
Abs Immature Granulocytes: 0.21 10*3/uL — ABNORMAL HIGH (ref 0.00–0.07)
Basophils Absolute: 0 10*3/uL (ref 0.0–0.1)
Basophils Relative: 0 %
Eosinophils Absolute: 0.1 10*3/uL (ref 0.0–0.5)
Eosinophils Relative: 1 %
HCT: 24 % — ABNORMAL LOW (ref 36.0–46.0)
Hemoglobin: 7.9 g/dL — ABNORMAL LOW (ref 12.0–15.0)
Immature Granulocytes: 3 %
Lymphocytes Relative: 12 %
Lymphs Abs: 0.9 10*3/uL (ref 0.7–4.0)
MCH: 27.6 pg (ref 26.0–34.0)
MCHC: 32.9 g/dL (ref 30.0–36.0)
MCV: 83.9 fL (ref 80.0–100.0)
Monocytes Absolute: 0.5 10*3/uL (ref 0.1–1.0)
Monocytes Relative: 6 %
Neutro Abs: 6 10*3/uL (ref 1.7–7.7)
Neutrophils Relative %: 78 %
Platelets: 102 10*3/uL — ABNORMAL LOW (ref 150–400)
RBC: 2.86 MIL/uL — ABNORMAL LOW (ref 3.87–5.11)
RDW: 15.5 % (ref 11.5–15.5)
WBC: 7.7 10*3/uL (ref 4.0–10.5)
nRBC: 0 % (ref 0.0–0.2)

## 2020-05-04 LAB — COMPREHENSIVE METABOLIC PANEL
ALT: 17 U/L (ref 0–44)
AST: 21 U/L (ref 15–41)
Albumin: 2.3 g/dL — ABNORMAL LOW (ref 3.5–5.0)
Alkaline Phosphatase: 53 U/L (ref 38–126)
Anion gap: 10 (ref 5–15)
BUN: 96 mg/dL — ABNORMAL HIGH (ref 8–23)
CO2: 26 mmol/L (ref 22–32)
Calcium: 8.4 mg/dL — ABNORMAL LOW (ref 8.9–10.3)
Chloride: 106 mmol/L (ref 98–111)
Creatinine, Ser: 1.78 mg/dL — ABNORMAL HIGH (ref 0.44–1.00)
GFR calc Af Amer: 34 mL/min — ABNORMAL LOW (ref >60)
GFR calc non Af Amer: 29 mL/min — ABNORMAL LOW (ref >60)
Glucose, Bld: 125 mg/dL — ABNORMAL HIGH (ref 70–99)
Potassium: 3.6 mmol/L (ref 3.5–5.1)
Sodium: 142 mmol/L (ref 135–145)
Total Bilirubin: 0.7 mg/dL (ref 0.3–1.2)
Total Protein: 5.1 g/dL — ABNORMAL LOW (ref 6.5–8.1)

## 2020-05-04 LAB — GLUCOSE, CAPILLARY
Glucose-Capillary: 103 mg/dL — ABNORMAL HIGH (ref 70–99)
Glucose-Capillary: 119 mg/dL — ABNORMAL HIGH (ref 70–99)

## 2020-05-04 LAB — PROTIME-INR
INR: 1.2 (ref 0.8–1.2)
Prothrombin Time: 14.6 seconds (ref 11.4–15.2)

## 2020-05-04 LAB — PHOSPHORUS: Phosphorus: 3.3 mg/dL (ref 2.5–4.6)

## 2020-05-04 LAB — MAGNESIUM: Magnesium: 2.4 mg/dL (ref 1.7–2.4)

## 2020-05-04 MED ORDER — MIDAZOLAM HCL 2 MG/2ML IJ SOLN
INTRAMUSCULAR | Status: AC
  Filled 2020-05-04: qty 2

## 2020-05-04 MED ORDER — FENTANYL CITRATE (PF) 100 MCG/2ML IJ SOLN
INTRAMUSCULAR | Status: AC
  Filled 2020-05-04: qty 2

## 2020-05-04 MED ORDER — CEFAZOLIN SODIUM-DEXTROSE 2-4 GM/100ML-% IV SOLN
INTRAVENOUS | Status: AC
  Filled 2020-05-04: qty 100

## 2020-05-04 MED ORDER — LIDOCAINE HCL 1 % IJ SOLN
INTRAMUSCULAR | Status: AC
  Filled 2020-05-04: qty 20

## 2020-05-04 MED ORDER — TRAVASOL 10 % IV SOLN
INTRAVENOUS | Status: AC
  Filled 2020-05-04: qty 900

## 2020-05-04 MED ORDER — MIDAZOLAM HCL 2 MG/2ML IJ SOLN
INTRAMUSCULAR | Status: AC | PRN
  Administered 2020-05-04: 0.5 mg via INTRAVENOUS

## 2020-05-04 MED ORDER — LIDOCAINE VISCOUS HCL 2 % MT SOLN
OROMUCOSAL | Status: AC
  Filled 2020-05-04: qty 15

## 2020-05-04 MED ORDER — LIDOCAINE HCL (PF) 1 % IJ SOLN
INTRAMUSCULAR | Status: AC | PRN
  Administered 2020-05-04: 10 mL via INTRADERMAL

## 2020-05-04 MED ORDER — IOHEXOL 300 MG/ML  SOLN
50.0000 mL | Freq: Once | INTRAMUSCULAR | Status: AC | PRN
  Administered 2020-05-04: 25 mL

## 2020-05-04 MED ORDER — CEFAZOLIN SODIUM-DEXTROSE 2-4 GM/100ML-% IV SOLN
2.0000 g | Freq: Once | INTRAVENOUS | Status: AC
  Administered 2020-05-04: 2 g via INTRAVENOUS
  Filled 2020-05-04: qty 100

## 2020-05-04 NOTE — Progress Notes (Signed)
PHARMACY - TOTAL PARENTERAL NUTRITION CONSULT NOTE   Indication: Prolonged ileus  Patient Measurements: Height: 5\' 4"  (162.6 cm) Weight: 105 kg (231 lb 7.7 oz) IBW/kg (Calculated) : 54.7 TPN AdjBW (KG): 65.5 Body mass index is 39.73 kg/m. Usual Weight: 98 kg  Assessment: 64 y/o F found to have metastatic gynecologic cancer with carcinomatosis and ureteral obstruction s/p stent placement with minimal oral intake for over a week, possible ileus. Pharmacy consulted to manage TPN.   Glucose / Insulin: No history of DM. CBGs 103-135.  Goal CBGs 100-150. 2 units SSI/24 hours.  Electrolytes: K (3.6) is on lower end of normal, but K concentration increased in TPN. All other lytes WNL including CorrCa 9.8. Renal: SCr (1.78) remains elevated, slightly improved. BUN (96) elevated. Nephrology following AKI. LFTs / TGs: LFTs WNL; TG 141 (8/30) Prealbumin / albumin: 8.8 (8/30)/ 2.3 (9/2) Intake / Output; MIVF: Unmeasured UOP x2, 150 mL output rectal tube; 1675 NG output.  MIVF: none, NS stopped by MD on 8/29 GI Imaging:  -8/21 Abd x-ray: ileus vs obstruction -8/26 Abd x-ray: possible ileus, unable to exclude distal colonic obstruction -8/28 Abd x-ray: Ureteral stent is stable.  Continued air-filled prominent colon may represent ileus -8/30 Abd x-ray: Mildly dilated small bowel loops in right abdomen appear slightly increased. Stable moderate gaseous distention of the colon. Findings are compatible with persistent adynamic ileus.  -8/30 CT abd: Moderate volume intra-abdominal ascites. Suspected ileus, incompletely evaluated. Moderate sized bilateral pleural effusions with associated bibasilar atelectasis, right greater than left.  -8/31 Abd x-ray: Findings consistent with an underlying partial small bowel obstruction or ileus, similar to prior examination.  Surgeries / Procedures:  -8/14 ureteral stent placement  -8/31 paracentesis  -9/1 thoracentesis Significant Events: -8/26: N/V, NGT placed.  CLD > NPO  Central access: PICC 8/18 TPN start date: 8/23  Nutritional Goals (per RD recommendation on 9/2):  kCal: 1700-1900, Protein: 80-90g, Fluid: 1.8 L/d  Goal TPN rate is 75 mL/hr (provides 90 g of protein and 1821 kcals per day); meeting >100% of patient needs  Note:  Intralipid product ordered in TPN from 8/23-8/28.  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 Pharmacist was informed on 8/28 that SMOFlipids had been used to prepare the TPN from 8/23-8/27 despite the Intralipid orders and that Intralipid was not available for the TPN on 8/28. The order was changed from Intralipid to SMOFlipids. Will continue with SMOFlipids at this time, as it appears she has been tolerating for several days.   Current Nutrition:  TPN, NPO  Possible palliative venting G-tube placement  Plan:   At 1800:  Continue TPN at goal rate of 75 mL/hr  Electrolytes in TPN: Decrease K concentration in TPN given AKI, add back some Phos  Na 73mEq/L, K 64mEq/L, Ca 9mEq/L, Mag 5 mEq/L, Phos 14mmol/L  Cl:Ac ratio max Acetate   Add standard MVI and trace elements to TPN  CBG check + sensitive SSI q8h  MIVF per MD (none at this time)   Monitor TPN labs on Monday/Thursday  Recheck electrolytes with AM labs tomorrow, supplement as needed  Lenis Noon, PharmD 05/04/20 2:14 PM

## 2020-05-04 NOTE — Progress Notes (Addendum)
Nutrition Follow-up  DOCUMENTATION CODES:   Obesity unspecified  INTERVENTION:  Continue TPN management per Pharmacy  D/c Boost Breeze; pt is NPO  NUTRITION DIAGNOSIS:   Increased nutrient needs related to cancer and cancer related treatments as evidenced by estimated needs.  Ongoing  GOAL:   Patient will meet greater than or equal to 90% of their needs  Met with TPN  MONITOR:   PO intake, Supplement acceptance, Weight trends, I & O's, Skin, Labs  REASON FOR ASSESSMENT:   Consult Assessment of nutrition requirement/status, New TPN/TNA  ASSESSMENT:   64 year old female currently admitted for metastatic cervical cancer s/p stent placement and cycle 1 of carboplatin and taxol on 04/20/2020 with multiple issues including bowel obstruction due to carcinomatosis with TPN and NG tube placement, symptomatic pleural effusions, increasing creatinine. PMH includes  CAD status post angioplasty to the LAD, HTN, GERD.  8/12: admitted 8/14: NPO-> Regular 8/15: Regular -> CLD 8/16: NPO -> soft diet 8/17: CLD 8/18: PICC placed 8/19: received first dose of chemo 8/21: CT showing progressive gaseous distention of the colon most consistent with ileus 8/26 NGT placed to LIS due to N/V; abdominal xray showed persistent ileus; pt made NPO 8/31 s/p paracentesis, yield 3.7L  9/1 s/p paracentesis, yield 1.1L  Pt continues to be NPO with TPN infusing and NGT to suction. Pt is going to be reassessed today for consideration of venting G-tube placement due to malignant obstruction/ileus.   Nephrology was consulted due to worsening renal function. Pt with AKI due to hydronephrosis and possible dehydration/3rd spacing. Renal function is slowly improving per MD.   Of note, pt has deferred discussions with PMT at this time.   UOP: 627m x24 hours NGT: 16751moutput x 24 hours Rectal tube: 15076mutput x 24 hours I/O: +8,591.8ml34mnce admit  Labs: CBGs 103-125 Medications: Dulcolax, Boost  Breeze TID, Novolog, Protonix  Diet Order:   Diet Order            Diet NPO time specified Except for: Ice Chips, Sips with Meds  Diet effective now                 EDUCATION NEEDS:   Not appropriate for education at this time  Skin:  Skin Assessment: Reviewed RN Assessment  Last BM:  9/2 via rectal tube  Height:   Ht Readings from Last 1 Encounters:  04/13/20 5' 4" (1.626 m)    Weight:   Wt Readings from Last 1 Encounters:  05/01/20 105 kg    BMI:  Body mass index is 39.73 kg/m.  Estimated Nutritional Needs:   Kcal:  1700-1900  Protein:  80-90g  Fluid:  1.8L/day    AmanLarkin Ina, RD, LDN RD pager number and weekend/on-call pager number located in [facility]

## 2020-05-04 NOTE — Progress Notes (Signed)
PROGRESS NOTE  TANAISHA PITTMAN  ZOX:[id number] DOB: 10-26-1955 DOA: 04/13/2020 PCP: Patient, No Pcp Per   Brief Narrative: Ms. Kilbride is a 64 y.o.female with PMH of CAD s/p LAD angioplasty, HTN, HLD, remote tobacco use, GERD who presented to [facility] ED 8/12 for 3 weeks of episodic diarrhea, decreased PO intake, dysuria, malaise and fatigue. CT done 8/13 showed a pelvic mass that was concerning for malignancy with evidence of possible metastatic disease. Ureteral stent was placed for R sided obstruction. GYN-ONC consulted 8/16 and felt that based on patient exam that patient likely had advanced cervical cancer. Biopsies were taken of bladder and cervix and both came back as adenocarcinoma. CA 125 also found to be elevated. Medical oncology has been following and patient has received one cycle carboplatin and paclitaxel 8/19. She developed protracted ileus and had NGT placed for this. TPN has been started for malnutrition. Patient also developed significant ascites for which paracentesis has been performed 8/31. Thoracentesis was performed 9/1. IR consulted for palliative venting gastrostomy.   Assessment & Plan: Principal Problem:   Cervical cancer ([place]) Active Problems:   Essential hypertension   UTI (urinary tract infection)   AKI (acute kidney injury) (HCC)   Hyponatremia   Orthostatic hypotension   Hyponatremia syndrome  Metastatic cervical cancer with peritoneal carcinomatosis: New diagnosis.  - Oncology following, s/p cycle 1 of carboplatin and paclitaxel on 04/20/2020  SBO, carcinomatous ileus:  - Continue NGT, antiemetics, analgesics - IR consulted for palliative venting gastrostomy planned 9/2. - TPN started through PICC, will continue  Malignant ascites: s/p paracentesis 3.7L on 8/31 - Cytology positive for adenocarcinoma.  Pleural effusion: s/p right thoracentesis 1.1L on 9/1  Severe protein calorie malnutrition: With multifactorial third-spacing of fluids worsened by  hypoalbuminemia.  - TPN as above.   Pancytopenia: Due to chemotherapy.  - Per onc, transfuse for hgb <8g/dl or platelets <10k. Repeat CBC in AM.  - Hold heparin with thrombocytopenia and need for procedures  AKI on stage IIIa CKD: Due to prerenal azotemia, possibly ATN. Developing after ureteral stent was placed.  - Hold ACE and HCTZ - Nephrology consulted. Continue intermittent albumin for hemodynamic support with severity of third-spacing.   CAD: s/p PTCA. No anginal complaints.  - Holding metoprolol with soft BP, holding ASA with thrombocytopenia  Hyperkalemia: Resolved  GERD - Continue PPI  Goals of care: Goals of Tx are palliative in nature, the patient is aware of this.  - Remains full code.   - Received chemotherapy for malignancy, wishes to continue this if offered.  - Broached possibility of palliative care consult which the patient would like to defer at this time.  Obesity: Estimated body mass index is 39.73 kg/m as calculated from the following:   Height as of this encounter: 5\' 4"  (1.626 m).   Weight as of this encounter: 105 kg.  DVT prophylaxis: SCDs Code Status: Full Family Communication: Son by phone today Disposition Plan:  Status is: Inpatient  Remains inpatient appropriate because:Persistent severe electrolyte disturbances and Inpatient level of care appropriate due to severity of illness  Dispo: The patient is from: Home              Anticipated d/c is to: Home              Anticipated d/c date is: > 3 days              Patient currently is not medically stable to d/c.  Consultants:   Oncology  GynOnc  Nephrology  General surgery  GI  Interventional radiology  Procedures:  04/15/20 Dr. Alinda Money CYSTOSCOPY WITH RETROGRADE PYELOGRAM/URETERAL STENT PLACEMENT    Cervical biopsy  Subjective: Much more interactive and upbeat today. Ready for G tube. Had good UOP. Having minimal, constant nausea without vomiting. Does not wish to discuss  palliative care consultation today.  Objective: Vitals:   05/04/20 0826 05/04/20 1036 05/04/20 1126 05/04/20 1356  BP:  (!) 108/56 115/65 (!) 120/57  Pulse:  73 (!) 101 98  Resp:   16 20  Temp:   98 F (36.7 C) 98.3 F (36.8 C)  TempSrc:   Oral Oral  SpO2: 97%  97% 96%  Weight:      Height:        Intake/Output Summary (Last 24 hours) at 05/04/2020 1526 Last data filed at 05/04/2020 1400 Gross per 24 hour  Intake 2979.17 ml  Output 2600 ml  Net 379.17 ml   Filed Weights   04/13/20 1234 04/24/20 0830 05/01/20 0838  Weight: 98 kg 98.2 kg 105 kg   Gen: Frail, pleasant female in no distress Pulm: Nonlabored breathing 2L O2. Clear, diminished. CV: Regular rate and rhythm. No murmur, rub, or gallop. No JVD, diffuse edema. GI: Abdomen soft, mildly distended, with very hypoactive bowel sounds.  Ext: Warm, no deformities Skin: No new rashes, lesions or ulcers on visualized skin. Para/thora sites c/d/i. Neuro: Alert and oriented. No focal neurological deficits. Psych: Judgement and insight appear fair. Mood euthymic & affect congruent. Behavior is appropriate.    Data Reviewed: I have personally reviewed following labs and imaging studies  CBC: Recent Labs  Lab 04/28/20 0604 04/28/20 0604 04/29/20 0829 05/01/20 0408 05/02/20 1500 05/03/20 0405 05/04/20 0950  WBC 0.8*  --  1.3* 5.0 6.5  --  7.7  NEUTROABS 0.2*  --  0.3* 3.0 4.5  --  6.0  HGB 9.9*   < > 10.0* 7.7* 7.2* 8.8* 7.9*  HCT 31.1*   < > 31.9* 24.2* 22.2* 26.8* 24.0*  MCV 86.4  --  86.7 87.1 85.7  --  83.9  PLT 83*  --  87* 77* 79*  --  102*   < > = values in this interval not displayed.   Basic Metabolic Panel: Recent Labs  Lab 04/30/20 0952 05/01/20 0408 05/02/20 0337 05/03/20 0405 05/04/20 0348  NA 132* 137 137 139 142  K 5.6* 4.1 3.9 3.8 3.6  CL 102 109 106 108 106  CO2 18* 20* 20* 22 26  GLUCOSE 952* 134* 129* 135* 125*  BUN 53* 77* 85* 100* 96*  CREATININE 1.38* 1.62* 1.89* 1.92* 1.78*  CALCIUM  8.5* 8.2* 8.3* 8.1* 8.4*  MG 2.4 2.1 2.2 2.3 2.4  PHOS 7.2* 4.5 4.9* 4.6 3.3   GFR: Estimated Creatinine Clearance: 36.7 mL/min (A) (by C-G formula based on SCr of 1.78 mg/dL (H)). Liver Function Tests: Recent Labs  Lab 04/28/20 0604 05/01/20 0408 05/02/20 0337 05/03/20 0405 05/04/20 0348  AST 23 39 31 25 21   ALT 13 25 28 25 17   ALKPHOS 47 70 70 65 53  BILITOT 0.5 0.4 0.5 0.7 0.7  PROT 5.3* 5.0* 5.0* 4.9* 5.1*  ALBUMIN 2.3* 2.0* 1.9* 1.7* 2.3*   Coagulation Profile: Recent Labs  Lab 05/04/20 0348  INR 1.2   CBG: Recent Labs  Lab 05/03/20 0002 05/03/20 0800 05/03/20 1525 05/03/20 2226 05/04/20 0734  GLUCAP 124* 129* 125* 120* 103*   Urine analysis:    Component Value Date/Time   COLORURINE AMBER (  A) 05/02/2020 1830   APPEARANCEUR CLOUDY (A) 05/02/2020 1830   LABSPEC 1.015 05/02/2020 1830   PHURINE 5.0 05/02/2020 1830   GLUCOSEU NEGATIVE 05/02/2020 1830   HGBUR LARGE (A) 05/02/2020 1830   [place] 05/02/2020 [place] 05/02/2020 1830   PROTEINUR 30 (A) 05/02/2020 1830   NITRITE NEGATIVE 05/02/2020 1830   LEUKOCYTESUR SMALL (A) 05/02/2020 1830   Recent Results (from the past 240 hour(s))  Body fluid culture     Status: None (Preliminary result)   Collection Time: 05/03/20  1:30 PM   Specimen: PATH Cytology Pleural fluid  Result Value Ref Range Status   Specimen Description   Final    PLEURAL RIGHT Performed at [facility], [place] [address], [place], [place] [address]    Special Requests   Final    NONE Performed at [facility], [place] [address] [place], [place] [address]    Gram Stain   Final    RARE WBC PRESENT, PREDOMINANTLY MONONUCLEAR NO ORGANISMS SEEN    Culture   Final    NO GROWTH < 24 HOURS Performed at [facility], [place] [address] [place], [place] [address]    Report Status PENDING  Incomplete      Radiology Studies: DG Abd 1 View  Result Date:  05/03/2020 CLINICAL DATA:  Nasogastric tube placement EXAM: ABDOMEN - 1 VIEW COMPARISON:  Portable exam 1214 hours compared 05/02/2020 FINDINGS: Tip of nasogastric tube projects over mid stomach. Upper normal heart size with pulmonary vascular congestion. Mediastinal contours normal. RIGHT arm PICC line tip projects over SVC. LEFT lower lobe atelectasis versus consolidation. Moderate RIGHT pleural effusion and basilar atelectasis. Gas in colon. RIGHT ureteral stent visualized. IMPRESSION: Tip of nasogastric tube projects over mid stomach. Persistent LEFT lower lobe atelectasis versus consolidation. Moderate RIGHT pleural effusion. Electronically Signed   By: Lavonia Dana M.D.   On: 05/03/2020 12:48   DG Chest Port 1 View  Result Date: 05/03/2020 CLINICAL DATA:  Status post right thoracentesis. EXAM: PORTABLE CHEST 1 VIEW COMPARISON:  04/30/2020 FINDINGS: Decreased right pleural effusion, no longer apparent on the right. Similar small left pleural effusion. No discernible pneumothorax on this semi erect study. Overlying left basilar opacity. Low lung volumes. Gastric tube courses below the diaphragm with the tip likely in the stomach. Similar cardiac silhouette. Right PICC with the tip projecting at the distal SVC. IMPRESSION: 1. Decreased right pleural effusion, no longer apparent. No discernible pneumothorax. 2. Similar small left pleural effusion. Overlying left basilar opacity, which may represent atelectasis, aspiration, or pneumonia. Electronically Signed   By: Margaretha Sheffield MD   On: 05/03/2020 13:50   DG Abd 2 Views  Result Date: 05/02/2020 CLINICAL DATA:  Weakness, diarrhea EXAM: ABDOMEN - 2 VIEW COMPARISON:  05/01/2020 FINDINGS: Nasogastric tube likely extends to the second portion of the duodenum with its proximal side hole in the region of the pylorus. This is unchanged from prior examination. Dilated loops of small bowel are again seen within the mid abdomen and gas is seen within multiple  nondilated loops of large bowel in keeping with changes of an underlying of partial small bowel obstruction or ileus. No gross free intraperitoneal gas. Moderate right pleural effusion again noted. Right double-J ureteral stent overlies its expected position. Pelvis excluded from view. IMPRESSION: 1. Findings consistent with an underlying partial small bowel obstruction or ileus, similar to prior examination. 2. Nasogastric tube tip in the second portion of the duodenum. Electronically Signed  By: Fidela Salisbury MD   On: 05/02/2020 19:27   US THORACENTESIS ASP PLEURAL SPACE W/IMG GUIDE  Result Date: 05/03/2020 INDICATION: Patient with metastatic gynecologic cancer with carcinomatosis, ascites, and now right pleural effusion. Request made for diagnostic and therapeutic right thoracentesis. EXAM: ULTRASOUND GUIDED DIAGNOSTIC AND THERAPEUTIC RIGHT THORACENTESIS MEDICATIONS: 10 mL 1% lidocaine COMPLICATIONS: None immediate. PROCEDURE: An ultrasound guided thoracentesis was thoroughly discussed with the patient and questions answered. The benefits, risks, alternatives and complications were also discussed. The patient understands and wishes to proceed with the procedure. Written consent was obtained. Ultrasound was performed to localize and mark an adequate pocket of fluid in the right chest. The area was then prepped and draped in the normal sterile fashion. 1% Lidocaine was used for local anesthesia. Under ultrasound guidance a 6 Fr Safe-T-Centesis catheter was introduced. Thoracentesis was performed. The catheter was removed and a dressing applied. FINDINGS: A total of approximately 1.1 liters of clear, yellow fluid was removed. Samples were sent to the laboratory as requested by the clinical team. IMPRESSION: Successful ultrasound guided diagnostic and therapeutic right thoracentesis yielding 1.1 liters of pleural fluid. Read by: Brynda Greathouse PA-C Electronically Signed   By: Sandi Mariscal M.D.   On: 05/03/2020  14:49    Scheduled Meds: . bisacodyl  10 mg Rectal BID  . Chlorhexidine Gluconate Cloth  6 each Topical Daily  . insulin aspart  0-9 Units Subcutaneous Q8H  . pantoprazole (PROTONIX) IV  40 mg Intravenous Q24H  . sodium chloride flush  10-40 mL Intracatheter Q12H   Continuous Infusions: . albumin human 25 g (05/04/20 1129)  . TPN ADULT (ION) 75 mL/hr at 05/04/20 0514  . TPN ADULT (ION)       LOS: 20 days   Time spent: 35 minutes.  Patrecia Pour, MD [facility] [email or web address] 05/04/2020, 3:26 PM

## 2020-05-04 NOTE — Progress Notes (Signed)
PT Cancellation Note  Patient Details Name: Eileen Burgess MRN: [id number] DOB: 07/10/1956   Cancelled Treatment:    Reason Eval/Treat Not Completed: Other (comment) Pt reports, "they're coming to take me for a procedure."  venting G-tube is planned by IR today.  Will check back as schedule permits.   Marianne Golightly,KATHrine E 05/04/2020, 10:42 AM Arlyce Dice, DPT Acute Rehabilitation Services Pager: [phone] Office: [phone]

## 2020-05-04 NOTE — Progress Notes (Signed)
GYN Oncology Follow Up  Patient reports feeling better this am after having a thoracentesis yesterday. No nausea or emesis reported. Has not been out of bed. No BM or flatus reported but rectal tube is in place and patient states she is unable to tell when she is going. States her abdominal tightness feels better as well. No dyspnea reported and improvement noted in the cough. No concerns voiced.  Patient is alert, oriented, in no acute distress. More alert and talkative today. NG to intermittent suction. Lungs clear and slightly diminished in the bases but much improved from previous assessments. Heart regular rate and rhythm. Abdomen with hypoactive bowel sounds, remains tympanic. Bilateral lower extrem edema noted. Rectal tube to straight drain with no drainage in the bag (pt stating it came out yesterday and had to be replaced).  IR to re-evaluate today for G tube placement. Continue plan of care. Being followed by nephrology and GI.

## 2020-05-04 NOTE — Procedures (Signed)
Interventional Radiology Procedure Note  Procedure: Percutaneous gastrostomy tube placement  Complications: None  Estimated Blood Loss: < 10 mL  Findings: 20 Fr pull-through gastrostomy placed with tip in body of stomach. OK to attach to wall suction whenever needed. Did not remove NGT yet after procedure.  Venetia Night. Kathlene Cote, M.D Pager:  [phone]

## 2020-05-04 NOTE — Progress Notes (Signed)
Admit: 04/13/2020 LOS: 64  Eileen Burgess recent dx metastatic GYN Ca, prob cervical, AKI, FTT, malignant ileus/SBO  Subjective:  . No co this AM, feels better, looks improved . Thoracentesis yesterday:  . Still high vol NGT drainage, for Gtube today with IR . > 0.6L UOP yesterdya, SCr stable . Remains on TPN . SCr 1.8, K 3.6, HCO3 26, AG 10  09/01 0701 - 09/02 0700 In: 2470.5 [P.O.:480; I.V.:1785; IV Piggyback:205.5] Out: 2450 [Urine:625; Emesis/NG output:1675; Stool:150]  Filed Weights   04/13/20 1234 04/24/20 0830 05/01/20 0838  Weight: 98 kg 98.2 kg 105 kg    Scheduled Meds: . bisacodyl  10 mg Rectal BID  . Chlorhexidine Gluconate Cloth  6 each Topical Daily  . insulin aspart  0-9 Units Subcutaneous Q8H  . pantoprazole (PROTONIX) IV  40 mg Intravenous Q24H  . sodium chloride flush  10-40 mL Intracatheter Q12H   Continuous Infusions: . albumin human 25 g (05/04/20 1129)  . TPN ADULT (ION) 75 mL/hr at 05/04/20 0514  . TPN ADULT (ION)     PRN Meds:.acetaminophen **OR** acetaminophen, alteplase, alum & mag hydroxide-simeth, guaiFENesin-dextromethorphan, heparin lock flush, heparin lock flush, ipratropium-albuterol, nitroGLYCERIN, ondansetron **OR** ondansetron (ZOFRAN) IV, phenol, prochlorperazine, simethicone, sodium chloride flush, sodium chloride flush, sodium chloride flush  Current Labs: reviewed    Physical Exam:  Blood pressure 115/65, pulse (!) 101, temperature 98 F (36.7 C), temperature source Oral, resp. rate 16, height 5\' 4"  (1.626 m), weight 105 kg, SpO2 97 %. NAD NGT in place Regular no rub Coarse bs b/l Distended mildly, TTP mild Trace LEE  A 1. AKI, nl BL, likely multifactorial inpaired renal perfusion, maybe now with ATN; doubt abd compartment syndrome.  Nonoliguric,  Stable / slight improved 2. 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 Pleural efusion s/p thoracentesis  9/1 4. Ascites, as above, s/p LVP 8/31 3.7L 5. Anemia / pancytopenia, from CTX, Onc following; improved 6. Acidosis, metabolic, stable 7. Ileus, severe, potentially for G tube today  P . Cont supportive care . No extra IVFs, cont TPN . Consider albumin boluses for hypotension . Consider palliative involvement . Daily weights, Daily Renal Panel, Strict I/Os, Avoid nephrotoxins (NSAIDs, judicious IV Contrast)   Pearson Grippe MD 05/04/2020, 11:55 AM  Recent Labs  Lab 05/02/20 0337 05/03/20 0405 05/04/20 0348  NA 137 139 142  K 3.9 3.8 3.6  CL 106 108 106  CO2 20* 22 26  GLUCOSE 129* 135* 125*  BUN 85* 100* 96*  CREATININE 1.89* 1.92* 1.78*  CALCIUM 8.3* 8.1* 8.4*  PHOS 4.9* 4.6 3.3   Recent Labs  Lab 05/01/20 0408 05/01/20 0408 05/02/20 1500 05/03/20 0405 05/04/20 0950  WBC 5.0  --  6.5  --  7.7  NEUTROABS 3.0  --  4.5  --  6.0  HGB 7.7*   < > 7.2* 8.8* 7.9*  HCT 24.2*   < > 22.2* 26.8* 24.0*  MCV 87.1  --  85.7  --  83.9  PLT 77*  --  79*  --  102*   < > = values in this interval not displayed.

## 2020-05-04 NOTE — Progress Notes (Signed)
Progress Note   Subjective  Pt feeling much better post paracentesis, thoracentesis and with ongoing NG suction    Objective  Vital signs in last 24 hours: Temp:  [97.6 F (36.4 C)-98.7 F (37.1 C)] 98.7 F (37.1 C) (09/02 0512) Pulse Rate:  [95-106] 106 (09/02 0512) Resp:  [18-20] 20 (09/02 0512) BP: (78-113)/(40-59) 113/59 (09/02 0512) SpO2:  [88 %-97 %] 97 % (09/02 0826) Last BM Date: 05/04/20  General: Alert, well-developed, in NAD Heart:  Regular rate and rhythm; no murmurs Chest: Clear to ascultation bilaterally Abdomen:  Nontender and distended but less so than 2 days ago. Decreased bowel sounds, without guarding, and without rebound.   Extremities:  Without edema. Neurologic:  Alert and  oriented x4; grossly normal neurologically. Psych:  Alert and cooperative. Normal mood and affect.  Intake/Output from previous day: 09/01 0701 - 09/02 0700 In: 2470.5 [P.O.:480; I.V.:1785; IV Piggyback:205.5] Out: 2450 [Urine:625; Emesis/NG output:1675; Stool:150] Intake/Output this shift: No intake/output data recorded.  Lab Results: Recent Labs    05/02/20 1500 05/03/20 0405  WBC 6.5  --   HGB 7.2* 8.8*  HCT 22.2* 26.8*  PLT 79*  --    BMET Recent Labs    05/02/20 0337 05/03/20 0405 05/04/20 0348  NA 137 139 142  K 3.9 3.8 3.6  CL 106 108 106  CO2 20* 22 26  GLUCOSE 129* 135* 125*  BUN 85* 100* 96*  CREATININE 1.89* 1.92* 1.78*  CALCIUM 8.3* 8.1* 8.4*   LFT Recent Labs    05/04/20 0348  PROT 5.1*  ALBUMIN 2.3*  AST 21  ALT 17  ALKPHOS 53  BILITOT 0.7   PT/INR Recent Labs    05/04/20 0348  LABPROT 14.6  INR 1.2    Studies/Results: DG Abd 1 View  Result Date: 05/03/2020 CLINICAL DATA:  Nasogastric tube placement EXAM: ABDOMEN - 1 VIEW COMPARISON:  Portable exam 1214 hours compared 05/02/2020 FINDINGS: Tip of nasogastric tube projects over mid stomach. Upper normal heart size with pulmonary vascular congestion. Mediastinal contours normal.  RIGHT arm PICC line tip projects over SVC. LEFT lower lobe atelectasis versus consolidation. Moderate RIGHT pleural effusion and basilar atelectasis. Gas in colon. RIGHT ureteral stent visualized. IMPRESSION: Tip of nasogastric tube projects over mid stomach. Persistent LEFT lower lobe atelectasis versus consolidation. Moderate RIGHT pleural effusion. Electronically Signed   By: Lavonia Dana M.D.   On: 05/03/2020 12:48   US Paracentesis  Result Date: 05/02/2020 INDICATION: Patient with history of stage IV cervical cancer/adenocarcinoma, carcinomatosis, ascites. Request made for diagnostic and therapeutic paracentesis. EXAM: ULTRASOUND GUIDED DIAGNOSTIC AND THERAPEUTIC PARACENTESIS MEDICATIONS: 1% lidocaine to skin and subcutaneous tissue COMPLICATIONS: None immediate. PROCEDURE: Informed written consent was obtained from the patient after a discussion of the risks, benefits and alternatives to treatment. A timeout was performed prior to the initiation of the procedure. Initial ultrasound scanning demonstrates a large amount of ascites within the right lower abdominal quadrant. The right lower abdomen was prepped and draped in the usual sterile fashion. 1% lidocaine was used for local anesthesia. Following this, a 19 gauge, 10-cm, Yueh catheter was introduced. An ultrasound image was saved for documentation purposes. The paracentesis was performed. The catheter was removed and a dressing was applied. The patient tolerated the procedure well without immediate post procedural complication. FINDINGS: A total of approximately 3.7 liters of slightly hazy, amber fluid was removed. Samples were sent to the laboratory as requested by the clinical team. IMPRESSION: Successful ultrasound-guided diagnostic and therapeutic paracentesis yielding  3.7 liters of peritoneal fluid. Read by: Rowe Robert, PA-C Electronically Signed   By: Jerilynn Mages.  Shick M.D.   On: 05/02/2020 11:15   DG Chest Port 1 View  Result Date:  05/03/2020 CLINICAL DATA:  Status post right thoracentesis. EXAM: PORTABLE CHEST 1 VIEW COMPARISON:  04/30/2020 FINDINGS: Decreased right pleural effusion, no longer apparent on the right. Similar small left pleural effusion. No discernible pneumothorax on this semi erect study. Overlying left basilar opacity. Low lung volumes. Gastric tube courses below the diaphragm with the tip likely in the stomach. Similar cardiac silhouette. Right PICC with the tip projecting at the distal SVC. IMPRESSION: 1. Decreased right pleural effusion, no longer apparent. No discernible pneumothorax. 2. Similar small left pleural effusion. Overlying left basilar opacity, which may represent atelectasis, aspiration, or pneumonia. Electronically Signed   By: Margaretha Sheffield MD   On: 05/03/2020 13:50   DG Abd 2 Views  Result Date: 05/02/2020 CLINICAL DATA:  Weakness, diarrhea EXAM: ABDOMEN - 2 VIEW COMPARISON:  05/01/2020 FINDINGS: Nasogastric tube likely extends to the second portion of the duodenum with its proximal side hole in the region of the pylorus. This is unchanged from prior examination. Dilated loops of small bowel are again seen within the mid abdomen and gas is seen within multiple nondilated loops of large bowel in keeping with changes of an underlying of partial small bowel obstruction or ileus. No gross free intraperitoneal gas. Moderate right pleural effusion again noted. Right double-J ureteral stent overlies its expected position. Pelvis excluded from view. IMPRESSION: 1. Findings consistent with an underlying partial small bowel obstruction or ileus, similar to prior examination. 2. Nasogastric tube tip in the second portion of the duodenum. Electronically Signed   By: Fidela Salisbury MD   On: 05/02/2020 19:27   US THORACENTESIS ASP PLEURAL SPACE W/IMG GUIDE  Result Date: 05/03/2020 INDICATION: Patient with metastatic gynecologic cancer with carcinomatosis, ascites, and now right pleural effusion. Request made  for diagnostic and therapeutic right thoracentesis. EXAM: ULTRASOUND GUIDED DIAGNOSTIC AND THERAPEUTIC RIGHT THORACENTESIS MEDICATIONS: 10 mL 1% lidocaine COMPLICATIONS: None immediate. PROCEDURE: An ultrasound guided thoracentesis was thoroughly discussed with the patient and questions answered. The benefits, risks, alternatives and complications were also discussed. The patient understands and wishes to proceed with the procedure. Written consent was obtained. Ultrasound was performed to localize and mark an adequate pocket of fluid in the right chest. The area was then prepped and draped in the normal sterile fashion. 1% Lidocaine was used for local anesthesia. Under ultrasound guidance a 6 Fr Safe-T-Centesis catheter was introduced. Thoracentesis was performed. The catheter was removed and a dressing applied. FINDINGS: A total of approximately 1.1 liters of clear, yellow fluid was removed. Samples were sent to the laboratory as requested by the clinical team. IMPRESSION: Successful ultrasound guided diagnostic and therapeutic right thoracentesis yielding 1.1 liters of pleural fluid. Read by: Brynda Greathouse PA-C Electronically Signed   By: Sandi Mariscal M.D.   On: 05/03/2020 14:49      Assessment & Recommendations   1. Carcinomatosis with ascites, partial SBO. Colonic distention has resolved. Symptoms improved post paracentesis,with NGT to LIS and with rectal tube. A venting G-tube is planned by IR. Can repeat paracentesis to reduce abdominal symptoms if a significant amount of ascites reaccumulates. Can DC rectal tube when diarrhea improves or when she is more mobile.   2. Right pleural effusion post thoracentesis yesterday.  GI signing off for now and available if needed.    LOS: 20 days  Eileen Burgess. Fuller Plan MD  05/04/2020, 9:16 AM

## 2020-05-04 NOTE — Progress Notes (Addendum)
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, biopsy showed adenocarcinoma Received cycle 1 of carboplatin and paclitaxel on 04/20/2020 and tolerated well We will continue to monitor for side effects of treatment and monitor lab work closely She developed pancytopenia secondary to chemotherapy-WBC recovering but has had persistent anemia and thrombocytopenia-repeat CBC from today shows mild anemia again Continue supportive care for now Repeat tomorrow, possible blood transfusion if needed Next dose of chemotherapy would be around September 9, however, given her significant decline in performance status, it is not clear to me that she is a candidate for further treatment   Pancytopenia secondary to chemotherapy Recommend transfusion if hemoglobin is less than 8 and platelet count less than 10 she received 1 unit of blood transfusion on August 31 with improvement.  Observe closely Consider blood transfusion again tomorrow if hemoglobin declines further   AKI due to hydronephrosis and possible dehydration/3rd spacing Renal function slowly improving nephrology consulted On gentle hydration  Protein calorie malnutrition evidence of 3rd spacing in addition to poor oral intake Dietitian is following Continue TPN   subacute bowel obstruction secondary to carcinomatosis NG tube is in for almost a week we have consulted interventional radiologist to replace with a G-tube this is strictly palliative in nature IR to reevaluate the patient today and possibly proceed with G-tube placement  Ascites Moderate ascites noted on CT scan she had repeat paracentesis on August 31  Recent shortness of breath/respiratory distress/vascular congestion with pulmonary edema,  resolved Remains off oxygen but still having some shortness of breath She has received Lasix on several occasions this admission Status post thoracentesis on 05/03/2020 with improvement of her breathing   Goals of care discussion She is aware she have stage IV disease Treatment goal is palliative The goal for treatment in this hospital stay is to resolve her bowel obstruction prior to discharge Overall, she has significant decline in performance status I have been trying to get the patient to participate with physical therapy, sitting on the chair and move around as much as possible Over the past week, she remain bedbound  Discharge planning She will likely be here for the next 3 to 5 days I have informed the patient she will likely not be a candidate for further treatment if she continues to decline She understand I offered to call her son to discuss this but at this point in time, she does not feel it is going to be helpful I will reassess tomorrow We should consider palliative care consult tomorrow if she continues to be like this without improvement  All questions were answered. The patient knows to call the clinic with any problems, questions or concerns.   Mikey Bussing, NP 05/04/2020 9:54 AM [name], MD  Subjective:  The patient feels a little bit better this morning She is more awake and alert NG tube is working well today She has no nausea or vomiting Rectal tube remains in place and she had about 150 cc of stool out in the past 24 hours Abdomen less distended Reports breathing has improved following thoracentesis  Objective:  Vitals:   05/04/20 0512 05/04/20 0826  BP: (!) 113/59   Pulse: (!) 106   Resp: 20   Temp: 98.7 F (37.1 C)   SpO2: 94% 97%  Intake/Output Summary (Last 24 hours) at 05/04/2020 0954 Last data filed at 05/04/2020 0514 Gross per 24 hour  Intake 2470.47 ml  Output 2450 ml  Net 20.47 ml    GENERAL: Awake and alert, no distress HEART:  regular rate & rhythm and no murmurs and no lower extremity edema ABDOMEN:abdomen distended but softer compared to prior exam, her bowel sounds are less active compared to yesterday's exam Musculoskeletal:no cyanosis of digits and no clubbing  NEURO: alert & oriented x 3 with fluent speech, but somewhat sleepy   Labs:  Recent Labs    05/02/20 0337 05/03/20 0405 05/04/20 0348  NA 137 139 142  K 3.9 3.8 3.6  CL 106 108 106  CO2 20* 22 26  GLUCOSE 129* 135* 125*  BUN 85* 100* 96*  CREATININE 1.89* 1.92* 1.78*  CALCIUM 8.3* 8.1* 8.4*  GFRNONAA 27* 27* 29*  GFRAA 31* 31* 34*  PROT 5.0* 4.9* 5.1*  ALBUMIN 1.9* 1.7* 2.3*  AST 31 25 21   ALT 28 25 17   ALKPHOS 70 65 53  BILITOT 0.5 0.7 0.7    Studies:  CT ABDOMEN WO CONTRAST  Result Date: 05/02/2020 CLINICAL DATA:  Evaluate anatomy prior to potential percutaneous gastrostomy tube placement. EXAM: CT ABDOMEN WITHOUT CONTRAST TECHNIQUE: Multidetector CT imaging of the abdomen was performed following the standard protocol without IV contrast. COMPARISON:  CT abdomen pelvis - 04/14/2020; abdominal radiograph-05/01/2020; 04/29/2020; 04/27/2020 FINDINGS: Lack of intravenous contrast limits the ability to evaluate solid abdominal organs. Lower chest: Limited visualization of the lower thorax demonstrates small to moderate-sized bilateral pleural effusions with associated bibasilar consolidative opacities, right greater than left. Borderline cardiomegaly.  Small pericardial effusion. Hepatobiliary: There is mild nodularity of the hepatic contour suggestive of cirrhotic change. Normal noncontrast appearance of the gallbladder given degree distention. No radiopaque gallstones. Moderate volume intra-abdominal ascites. Pancreas: Normal noncontrast appearance of the pancreas. Spleen: Normal noncontrast appearance of the spleen. Adrenals/Urinary Tract: Post right-sided ureteral stent placement. The right kidney is slightly atrophic in comparison to the left.  No renal stones. There is a minimal amount likely age and body habitus related bilateral perinephric stranding. No urine obstruction. Normal noncontrast appearance the bilateral adrenal glands. The urinary bladder was not imaged. Stomach/Bowel: The anterior wall of the gastric antrum is well apposed against the ventral wall of the upper abdomen without interposition of the hepatic parenchyma or transverse colon. Enteric tube tip terminates within the descending portion of the duodenum. There is moderate distension majority of the visualized loops of large and small bowel, similar to abdominal radiographs, incompletely evaluated though potentially the sequela of ileus. No pneumoperitoneum, pneumatosis or portal venous gas. Vascular/Lymphatic: Moderate amount of atherosclerotic plaque within a normal caliber abdominal aorta. No bulky retroperitoneal or mesenteric adenopathy on this noncontrast examination. Other: Moderate to large amount of diffuse body wall anasarca, most conspicuous about the midline of the low back and bilateral flanks. Musculoskeletal: No acute or aggressive osseous abnormalities. Stigmata of dish within the lower thoracic spine. IMPRESSION: 1. Despite amenable gastric anatomy, the presence moderate volume intra-abdominal ascites renders the patient a poor candidate for gastrostomy tube placement. 2. Suspected ileus, incompletely evaluated. 3. Moderate sized bilateral pleural effusions with associated bibasilar atelectasis, right greater than left. 4. Small pericardial effusion. Further evaluation cardiac echo could be performed as indicated. 5.  Aortic Atherosclerosis (ICD10-I70.0). 6. Post right-sided ureteral stent placement with mild asymmetric atrophy of the right kidney comparison to left. No evidence of urinary obstruction. Electronically Signed   By: Jenny Reichmann  Watts M.D.   On: 05/02/2020 08:14   DG Chest 1 View  Result Date: 04/30/2020 CLINICAL DATA:  Shortness of breath and cough. EXAM:  CHEST  1 VIEW COMPARISON:  Chest radiograph dated 04/28/2020. FINDINGS: The heart size and mediastinal contours are within normal limits. Opacity of the right hemithorax likely reflects a layering pleural effusion with associated atelectasis. A small left pleural effusion with associated atelectasis is noted. Mild-to-moderate bibasilar airspace opacities likely contribute. Mild diffuse bilateral interstitial opacities are noted. There is no pneumothorax. A right upper extremity peripherally inserted central venous catheter tip overlies the superior vena cava. An enteric tube enters the stomach and terminates below the field of view. IMPRESSION: Bilateral pleural effusions with associated atelectasis and/or airspace opacities. Mild diffuse bilateral interstitial opacities may represent pulmonary edema or infection. Electronically Signed   By: Zerita Boers M.D.   On: 04/30/2020 15:04   DG Chest 1 View  Result Date: 04/20/2020 CLINICAL DATA:  Increasing short of breath EXAM: CHEST  1 VIEW COMPARISON:  04/13/2020, CT 04/15/2020 FINDINGS: Right upper extremity central venous catheter tip over the SVC. Cardiomegaly with vascular congestion and diffuse interstitial opacity consistent with edema. Moderate right-sided pleural effusion, likely layering and resulting in hazy asymmetric appearance of the right thorax. At least small left pleural effusion. Bibasilar consolidations. No pneumothorax. IMPRESSION: 1. Cardiomegaly with vascular congestion and diffuse interstitial pulmonary edema. 2. Bilateral pleural effusions, at least moderate on the right and small on the left. 3. Bibasilar consolidations Electronically Signed   By: Donavan Foil M.D.   On: 04/20/2020 22:20   DG Chest 2 View  Result Date: 04/13/2020 CLINICAL DATA:  Weakness diarrhea EXAM: CHEST - 2 VIEW COMPARISON:  07/28/2004 FINDINGS: Heart size upper normal.  Vascularity normal.  Coronary stent noted. Small bilateral pleural effusions. Mild bibasilar  atelectasis/infiltrate. IMPRESSION: Interval development of mild bibasilar airspace disease and small pleural effusions. Negative for heart failure. Electronically Signed   By: Franchot Gallo M.D.   On: 04/13/2020 13:57   DG Abd 1 View  Result Date: 05/03/2020 CLINICAL DATA:  Nasogastric tube placement EXAM: ABDOMEN - 1 VIEW COMPARISON:  Portable exam 1214 hours compared 05/02/2020 FINDINGS: Tip of nasogastric tube projects over mid stomach. Upper normal heart size with pulmonary vascular congestion. Mediastinal contours normal. RIGHT arm PICC line tip projects over SVC. LEFT lower lobe atelectasis versus consolidation. Moderate RIGHT pleural effusion and basilar atelectasis. Gas in colon. RIGHT ureteral stent visualized. IMPRESSION: Tip of nasogastric tube projects over mid stomach. Persistent LEFT lower lobe atelectasis versus consolidation. Moderate RIGHT pleural effusion. Electronically Signed   By: Lavonia Dana M.D.   On: 05/03/2020 12:48   DG Abd 1 View  Result Date: 05/01/2020 CLINICAL DATA:  Follow-up ileus EXAM: ABDOMEN - 1 VIEW COMPARISON:  04/29/2020 abdominal radiograph FINDINGS: Enteric tube terminates in the descending duodenum. Stable right nephroureteral stent. Mildly dilated small bowel loops in right abdomen appears slightly increased. Moderate gaseous distention of the colon appears similar. No evidence of pneumatosis or pneumoperitoneum. No radiopaque nephrolithiasis. IMPRESSION: 1. Enteric tube terminates in the descending duodenum. 2. Mildly dilated small bowel loops in right abdomen appear slightly increased. Stable moderate gaseous distention of the colon. Findings are compatible with persistent adynamic ileus. Electronically Signed   By: Ilona Sorrel M.D.   On: 05/01/2020 10:05   DG Abd 1 View  Result Date: 04/29/2020 CLINICAL DATA:  Follow-up ileus EXAM: ABDOMEN - 1 VIEW COMPARISON:  April 27, 2020 FINDINGS: A right ureteral stent is stable.  In NG tube is identified. I suspect  the distal tip may be in the proximal duodenum. Air-filled prominent colon remains. No small bowel dilatation. No other acute abnormalities. IMPRESSION: 1. Support apparatus as above. 2. Continued air-filled prominent colon may represent ileus. No other abnormalities. Electronically Signed   By: Dorise Bullion III M.D   On: 04/29/2020 09:40   DG Abd 1 View  Result Date: 04/27/2020 CLINICAL DATA:  Obstruction EXAM: ABDOMEN - 1 VIEW COMPARISON:  Portable exam 1047 hours compared 04/22/2020 FINDINGS: RIGHT ureteral stent unchanged. Gaseous distention of colon from tip of cecum through splenic flexure. Absent gas within descending and rectosigmoid colon. Small bowel gas pattern normal. No bowel wall thickening or pathologic calcification. IMPRESSION: Mild gaseous distention of the proximal half the colon with absent gas distally, could reflect ileus but unable to exclude distal colonic obstruction with this appearance. Electronically Signed   By: Lavonia Dana M.D.   On: 04/27/2020 11:01   CT CHEST W CONTRAST  Result Date: 04/15/2020 CLINICAL DATA:  Cancer of unknown primary, staging. EXAM: CT CHEST WITH CONTRAST TECHNIQUE: Multidetector CT imaging of the chest was performed during intravenous contrast administration. CONTRAST:  23mL OMNIPAQUE IOHEXOL 300 MG/ML  SOLN COMPARISON:  None. FINDINGS: Cardiovascular: There is mild calcification of the aortic arch. Normal heart size. No pericardial effusion. A coronary artery stent is seen. Mediastinum/Nodes: No enlarged mediastinal, hilar, or axillary lymph nodes. The thyroid gland and trachea demonstrate no significant findings. There is a small hiatal hernia. Lungs/Pleura: Mild bilateral lower lobe atelectasis is seen, right slightly greater than left. Very mild slightly nodular appearing scarring and/or atelectasis is seen along the posterior aspect of the inferior left upper lobe. There is a small left pleural effusion. A moderate sized right pleural effusion is  also noted. No pneumothorax is identified. Upper Abdomen: A moderate amount of abdominal free fluid is seen. There is moderate to marked severity right-sided hydronephrosis with delayed renal cortical enhancement involving the right kidney. Musculoskeletal: No chest wall abnormality. No acute or significant osseous findings. Degenerative changes seen throughout the thoracic spine. IMPRESSION: 1. Small left pleural effusion with a moderate sized right pleural effusion. 2. Mild bilateral lower lobe atelectasis, right slightly greater than left. 3. Very mild slightly nodular appearing scarring and/or atelectasis along the posterior aspect of the inferior left upper lobe. 4. Moderate amount of abdominal free fluid. 5. Moderate to marked severity right-sided hydronephrosis with delayed renal cortical enhancement involving the right kidney. This is suggestive of partial renal obstruction. 6. Small hiatal hernia. 7. Aortic atherosclerosis. Aortic Atherosclerosis (ICD10-I70.0). Electronically Signed   By: Virgina Norfolk M.D.   On: 04/15/2020 15:38   US Paracentesis  Result Date: 05/02/2020 INDICATION: Patient with history of stage IV cervical cancer/adenocarcinoma, carcinomatosis, ascites. Request made for diagnostic and therapeutic paracentesis. EXAM: ULTRASOUND GUIDED DIAGNOSTIC AND THERAPEUTIC PARACENTESIS MEDICATIONS: 1% lidocaine to skin and subcutaneous tissue COMPLICATIONS: None immediate. PROCEDURE: Informed written consent was obtained from the patient after a discussion of the risks, benefits and alternatives to treatment. A timeout was performed prior to the initiation of the procedure. Initial ultrasound scanning demonstrates a large amount of ascites within the right lower abdominal quadrant. The right lower abdomen was prepped and draped in the usual sterile fashion. 1% lidocaine was used for local anesthesia. Following this, a 19 gauge, 10-cm, Yueh catheter was introduced. An ultrasound image was saved  for documentation purposes. The paracentesis was performed. The catheter was removed and a dressing was applied. The patient tolerated  the procedure well without immediate post procedural complication. FINDINGS: A total of approximately 3.7 liters of slightly hazy, amber fluid was removed. Samples were sent to the laboratory as requested by the clinical team. IMPRESSION: Successful ultrasound-guided diagnostic and therapeutic paracentesis yielding 3.7 liters of peritoneal fluid. Read by: Rowe Robert, PA-C Electronically Signed   By: Jerilynn Mages.  Shick M.D.   On: 05/02/2020 11:15   DG Chest Port 1 View  Result Date: 05/03/2020 CLINICAL DATA:  Status post right thoracentesis. EXAM: PORTABLE CHEST 1 VIEW COMPARISON:  04/30/2020 FINDINGS: Decreased right pleural effusion, no longer apparent on the right. Similar small left pleural effusion. No discernible pneumothorax on this semi erect study. Overlying left basilar opacity. Low lung volumes. Gastric tube courses below the diaphragm with the tip likely in the stomach. Similar cardiac silhouette. Right PICC with the tip projecting at the distal SVC. IMPRESSION: 1. Decreased right pleural effusion, no longer apparent. No discernible pneumothorax. 2. Similar small left pleural effusion. Overlying left basilar opacity, which may represent atelectasis, aspiration, or pneumonia. Electronically Signed   By: Margaretha Sheffield MD   On: 05/03/2020 13:50   DG Chest Port 1 View  Result Date: 04/28/2020 CLINICAL DATA:  64 year old female with persistent cough. Query fluid overload. EXAM: PORTABLE CHEST 1 VIEW COMPARISON:  Portable chest 04/20/2020 and earlier. FINDINGS: Portable AP semi upright view at 1214 hours. Stable right PICC line. Enteric tube has been placed and courses to the stomach, tip not included. Improved bilateral ventilation, but continued basilar predominant increased interstitial opacity, and dense retrocardiac opacity. Veiling opacity on the right is compatible  with a superimposed pleural effusion. No pneumothorax. Upper lung pulmonary vascularity appears more normal now. Mediastinal contours are stable and within normal limits. Dense retrocardiac opacity without air bronchograms. Paucity of bowel gas. No acute osseous abnormality identified. IMPRESSION: 1. Suspect regression of pulmonary edema since 04/20/2020, but with residual vascular congestion, small right pleural effusion, and superimposed left lower lobe collapse or consolidation. 2. Enteric tube placed into the stomach, tip not included. Electronically Signed   By: Genevie Ann M.D.   On: 04/28/2020 12:35   DG Abd 2 Views  Result Date: 05/02/2020 CLINICAL DATA:  Weakness, diarrhea EXAM: ABDOMEN - 2 VIEW COMPARISON:  05/01/2020 FINDINGS: Nasogastric tube likely extends to the second portion of the duodenum with its proximal side hole in the region of the pylorus. This is unchanged from prior examination. Dilated loops of small bowel are again seen within the mid abdomen and gas is seen within multiple nondilated loops of large bowel in keeping with changes of an underlying of partial small bowel obstruction or ileus. No gross free intraperitoneal gas. Moderate right pleural effusion again noted. Right double-J ureteral stent overlies its expected position. Pelvis excluded from view. IMPRESSION: 1. Findings consistent with an underlying partial small bowel obstruction or ileus, similar to prior examination. 2. Nasogastric tube tip in the second portion of the duodenum. Electronically Signed   By: Fidela Salisbury MD   On: 05/02/2020 19:27   DG Abd Portable 1V  Result Date: 04/27/2020 CLINICAL DATA:  Nasogastric tube placement EXAM: PORTABLE ABDOMEN - 1 VIEW COMPARISON:  04/27/2020 FINDINGS: Interval placement of esophagogastric tube, tip and side port below the diaphragm, looped in the gastric fundus. Diffusely distended colon similar to prior examination. Partially imaged right double-J ureteral stent. IMPRESSION:  1. Interval placement of esophagogastric tube, tip and side port below the diaphragm, looped in the gastric fundus. 2. Diffusely distended colon similar to prior examination.  Electronically Signed   By: Eddie Candle M.D.   On: 04/27/2020 13:33   DG Abd Portable 2V  Result Date: 04/22/2020 CLINICAL DATA:  Decreased bowel sounds EXAM: PORTABLE ABDOMEN - 2 VIEW COMPARISON:  04/21/2020 FINDINGS: Supine frontal views of the abdomen and pelvis are obtained. Right ureteral stent is stable. There is increasing gaseous distention of the colon, most compatible with ileus. No evidence of small-bowel obstruction. No masses or abnormal calcifications. IMPRESSION: 1. Progressive gaseous distention of the colon most consistent with ileus. 2. Stable right ureteral stent. Electronically Signed   By: Randa Ngo M.D.   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 Hypoactive bowel sounds. EXAM: PORTABLE ABDOMEN - 2 VIEW COMPARISON:  KUB 04/20/2020.  CT Abdomen and Pelvis 04/14/2020. FINDINGS: Upright and supine views of the abdomen and pelvis. Stable right double-J ureteral stent. Mildly to moderately gas distended large bowel again noted, with gas present to the mid sigmoid. As before, no dilated small bowel loops. But gas is increased since 04/14/2020. No pneumoperitoneum. Stable lung bases, right pleural effusion again evident. Stable visualized osseous structures. IMPRESSION: 1. Continued gas distended colon to the sigmoid, although no dilated small bowel to strongly suggest mechanical obstruction. Continue to favor ileus. 2. No free air. Stable right ureteral stent. Persistent right pleural effusion. Electronically Signed   By: Genevie Ann M.D.   On: 04/21/2020 13:54   DG Abd Portable 2V  Result Date: 04/20/2020 CLINICAL DATA:  Hypoactive bowel sounds. EXAM: PORTABLE ABDOMEN - 2 VIEW COMPARISON:  No prior.  FINDINGS: Double-J right ureteral stent in good anatomic position. Colon is slightly distended. Cecum measures 8.9 cm in diameter. Although these findings most likely represent colonic ileus follow-up exam suggested to exclude colonic obstruction. No small bowel distention. No free air. Bibasilar atelectasis/infiltrates. Small right pleural effusion. IMPRESSION: 1.  Double-J right ureteral stent in good anatomic position. 2. Colon is slightly distended. Cecum measures 8.9 cm. Although these findings most likely represent colonic ileus, follow-up exam suggested to exclude colonic obstruction. No small bowel distention. No free air. 3.  Bibasilar atelectasis/infiltrates. Electronically Signed   By: Marcello Moores  Register   On: 04/20/2020 13:00   DG C-Arm 1-60 Min-No Report  Result Date: 04/15/2020 Fluoroscopy was utilized by the requesting physician.  No radiographic interpretation.   CT RENAL STONE STUDY  Result Date: 04/14/2020 CLINICAL DATA:  64 year old female with hematuria. EXAM: CT ABDOMEN AND PELVIS WITHOUT CONTRAST TECHNIQUE: Multidetector CT imaging of the abdomen and pelvis was performed following the standard protocol without IV contrast. COMPARISON:  None. FINDINGS: Evaluation of this exam is limited in the absence of intravenous contrast. Lower chest: Partially visualized moderate right and small left pleural effusions. There is associated partial compressive atelectasis of the lower lobes. Pneumonia is not excluded. Clinical correlation is recommended. There is coronary vascular calcification. Partially visualized trace pericardial effusion. There is no intra-abdominal free air. Small ascites. Hepatobiliary: No focal liver abnormality is seen. No gallstones, gallbladder wall thickening, or biliary dilatation. Pancreas: Unremarkable. No pancreatic ductal dilatation or surrounding inflammatory changes. Spleen: Normal in size without focal abnormality. Adrenals/Urinary Tract: The adrenal glands  unremarkable. The left kidney is unremarkable. There is a moderate right hydronephrosis with mild right hydroureter. No stone identified. There is a transition point in the distal right ureter or right UVJ. The urinary bladder is minimally distended. There is possible mild nodular thickening  of the posterior bladder wall adjacent to the right UVJ (77/2). There is loss of fat plane between the anterior lower uterus/cervix and posterior bladder wall. The lower uterus/cervical region is somewhat enlarged. Findings concerning for a neoplastic process either arising from the bladder urothelium or from the cervix and invading into the posterior bladder wall with obstruction of the right UVJ or distal right ureter. Stomach/Bowel: There is a small hiatal hernia. There is no bowel obstruction. There is sigmoid diverticulosis. The appendix is not visualized with certainty. No inflammatory changes identified in the right lower quadrant. Vascular/Lymphatic: Moderate aortoiliac atherosclerotic disease. The IVC is unremarkable. No portal venous gas. There is no adenopathy. Reproductive: The uterus is anteverted. Enlargement of the lower uterus/cervical region as described above. Other: There is diffuse omental nodularity and caking consistent with metastatic disease. Musculoskeletal: Osteopenia. No acute osseous pathology. IMPRESSION: 1. Findings concerning for a neoplastic process either arising from the bladder urothelium or from the cervix and invading into the posterior bladder wall with obstruction of the right UVJ or distal right ureter. There is associated moderate right hydronephrosis. Sampling of the ascitic fluid may provide further diagnostic information. 2. Diffuse omental nodularity and caking consistent with metastatic disease. 3. Moderate right and small left pleural effusions with associated partial compressive atelectasis of the lower lobes. Pneumonia is not excluded. Clinical correlation is recommended. 4.  Sigmoid diverticulosis. No bowel obstruction. 5. Aortic Atherosclerosis (ICD10-I70.0). Electronically Signed   By: Anner Crete M.D.   On: 04/14/2020 17:44   [place] EKG SITE RITE  Result Date: 04/18/2020 If Site Rite image not attached, placement could not be confirmed due to current cardiac rhythm.  US THORACENTESIS ASP PLEURAL SPACE W/IMG GUIDE  Result Date: 05/03/2020 INDICATION: Patient with metastatic gynecologic cancer with carcinomatosis, ascites, and now right pleural effusion. Request made for diagnostic and therapeutic right thoracentesis. EXAM: ULTRASOUND GUIDED DIAGNOSTIC AND THERAPEUTIC RIGHT THORACENTESIS MEDICATIONS: 10 mL 1% lidocaine COMPLICATIONS: None immediate. PROCEDURE: An ultrasound guided thoracentesis was thoroughly discussed with the patient and questions answered. The benefits, risks, alternatives and complications were also discussed. The patient understands and wishes to proceed with the procedure. Written consent was obtained. Ultrasound was performed to localize and mark an adequate pocket of fluid in the right chest. The area was then prepped and draped in the normal sterile fashion. 1% Lidocaine was used for local anesthesia. Under ultrasound guidance a 6 Fr Safe-T-Centesis catheter was introduced. Thoracentesis was performed. The catheter was removed and a dressing applied. FINDINGS: A total of approximately 1.1 liters of clear, yellow fluid was removed. Samples were sent to the laboratory as requested by the clinical team. IMPRESSION: Successful ultrasound guided diagnostic and therapeutic right thoracentesis yielding 1.1 liters of pleural fluid. Read by: Brynda Greathouse PA-C Electronically Signed   By: Sandi Mariscal M.D.   On: 05/03/2020 14:49

## 2020-05-04 NOTE — Plan of Care (Signed)

## 2020-05-04 NOTE — Progress Notes (Signed)
Pt weaned from 2L O2 to RA. SpO2 96% on RA.

## 2020-05-05 LAB — CBC WITH DIFFERENTIAL/PLATELET
Abs Immature Granulocytes: 0.13 10*3/uL — ABNORMAL HIGH (ref 0.00–0.07)
Basophils Absolute: 0 10*3/uL (ref 0.0–0.1)
Basophils Relative: 0 %
Eosinophils Absolute: 0 10*3/uL (ref 0.0–0.5)
Eosinophils Relative: 0 %
HCT: 21.7 % — ABNORMAL LOW (ref 36.0–46.0)
Hemoglobin: 7.1 g/dL — ABNORMAL LOW (ref 12.0–15.0)
Immature Granulocytes: 2 %
Lymphocytes Relative: 12 %
Lymphs Abs: 0.8 10*3/uL (ref 0.7–4.0)
MCH: 28 pg (ref 26.0–34.0)
MCHC: 32.7 g/dL (ref 30.0–36.0)
MCV: 85.4 fL (ref 80.0–100.0)
Monocytes Absolute: 0.3 10*3/uL (ref 0.1–1.0)
Monocytes Relative: 4 %
Neutro Abs: 6 10*3/uL (ref 1.7–7.7)
Neutrophils Relative %: 82 %
Platelets: 115 10*3/uL — ABNORMAL LOW (ref 150–400)
RBC: 2.54 MIL/uL — ABNORMAL LOW (ref 3.87–5.11)
RDW: 15.5 % (ref 11.5–15.5)
WBC: 7.3 10*3/uL (ref 4.0–10.5)
nRBC: 0 % (ref 0.0–0.2)

## 2020-05-05 LAB — BASIC METABOLIC PANEL
Anion gap: 11 (ref 5–15)
BUN: 85 mg/dL — ABNORMAL HIGH (ref 8–23)
CO2: 29 mmol/L (ref 22–32)
Calcium: 8.7 mg/dL — ABNORMAL LOW (ref 8.9–10.3)
Chloride: 106 mmol/L (ref 98–111)
Creatinine, Ser: 1.41 mg/dL — ABNORMAL HIGH (ref 0.44–1.00)
GFR calc Af Amer: 45 mL/min — ABNORMAL LOW (ref >60)
GFR calc non Af Amer: 39 mL/min — ABNORMAL LOW (ref >60)
Glucose, Bld: 132 mg/dL — ABNORMAL HIGH (ref 70–99)
Potassium: 3.7 mmol/L (ref 3.5–5.1)
Sodium: 146 mmol/L — ABNORMAL HIGH (ref 135–145)

## 2020-05-05 LAB — GLUCOSE, CAPILLARY
Glucose-Capillary: 116 mg/dL — ABNORMAL HIGH (ref 70–99)
Glucose-Capillary: 118 mg/dL — ABNORMAL HIGH (ref 70–99)
Glucose-Capillary: 131 mg/dL — ABNORMAL HIGH (ref 70–99)
Glucose-Capillary: 133 mg/dL — ABNORMAL HIGH (ref 70–99)

## 2020-05-05 LAB — TYPE AND SCREEN
ABO/RH(D): O POS
Antibody Screen: NEGATIVE
Unit division: 0

## 2020-05-05 LAB — BPAM RBC
Blood Product Expiration Date: 202109292359
ISSUE DATE / TIME: 202108311746
Unit Type and Rh: 5100

## 2020-05-05 LAB — MAGNESIUM: Magnesium: 2.4 mg/dL (ref 1.7–2.4)

## 2020-05-05 LAB — PREPARE RBC (CROSSMATCH)

## 2020-05-05 LAB — PHOSPHORUS: Phosphorus: 2.7 mg/dL (ref 2.5–4.6)

## 2020-05-05 MED ORDER — TRAVASOL 10 % IV SOLN
INTRAVENOUS | Status: AC
  Filled 2020-05-05: qty 900

## 2020-05-05 MED ORDER — METOPROLOL TARTRATE 5 MG/5ML IV SOLN
5.0000 mg | Freq: Three times a day (TID) | INTRAVENOUS | Status: DC
  Administered 2020-05-05 – 2020-05-09 (×13): 5 mg via INTRAVENOUS
  Filled 2020-05-05 (×15): qty 5

## 2020-05-05 MED ORDER — SODIUM CHLORIDE 0.9% IV SOLUTION: Freq: Once | INTRAVENOUS | Status: AC

## 2020-05-05 NOTE — TOC Progression Note (Signed)
Transition of Care [facility]) - Progression Note    Patient Details  Name: FLORITA NITSCH MRN: [id number] Date of Birth: 09-19-1955  Transition of Care [facility]) CM/SW Contact  Ross Ludwig, [place] Phone Number: 05/05/2020, 5:06 PM  Clinical Narrative:     Patient current receiving TPN.  CSW to continue to follow patient's progress throughout discharge planning.   Expected Discharge Plan: Home/Self Care Barriers to Discharge: Continued Medical Work up  Expected Discharge Plan and Services Expected Discharge Plan: Home/Self Care                                               Social Determinants of Health (SDOH) Interventions    Readmission Risk Interventions Readmission Risk Prevention Plan 04/20/2020  Transportation Screening Complete  PCP or Specialist Appt within 5-7 Days Complete  Medication Review (RN CM) Complete  Some recent data might be hidden

## 2020-05-05 NOTE — Progress Notes (Signed)
PROGRESS NOTE  Eileen Burgess  QIW:[id number] DOB: [birth year] DOA: 04/13/2020 PCP: Patient, No Pcp Per   Brief Narrative: [name] is a 64 y.o.female with PMH of CAD s/p LAD angioplasty, HTN, HLD, remote tobacco use, GERD who presented to [facility] ED 8/12 for 3 weeks of episodic diarrhea, decreased PO intake, dysuria, malaise and fatigue. CT done 8/13 showed a pelvic mass that was concerning for malignancy with evidence of possible metastatic disease. Ureteral stent was placed for R sided obstruction. GYN-ONC consulted 8/16 and felt that based on patient exam that patient likely had advanced cervical cancer. Biopsies were taken of bladder and cervix and both came back as adenocarcinoma. CA 125 also found to be elevated. Medical oncology has been following and patient has received one cycle carboplatin and paclitaxel 8/19. She developed protracted ileus and had NGT placed for this. TPN has been started for malnutrition. Patient also developed significant ascites for which paracentesis has been performed 8/31. Thoracentesis was performed 9/1. IR consulted for palliative venting gastrostomy.   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  - Oncology following, s/p cycle 1 of carboplatin and paclitaxel on 04/20/2020  SBO, carcinomatous ileus:  - Continue NGT, antiemetics, analgesics -Status post venting gastrostomy on 9/2 by IR. - TPN started through PICC, will continue.    Malignant ascites: s/p paracentesis 3.7L on 8/31 - Cytology positive for adenocarcinoma.  Pleural effusion: s/p right thoracentesis 1.1L on 9/1  Severe protein calorie malnutrition: With multifactorial third-spacing of fluids worsened by hypoalbuminemia.  -  TPN as above.   Pancytopenia: Due to chemotherapy.  - Per onc, transfuse for hgb <8g/dl or platelets <10k.  CBC today shows hemoglobin 7.1.  1 unit PRBC has been ordered by oncology. -Continue to hold heparin with thrombocytopenia and anemia.  AKI on stage IIIa CKD: Due to prerenal azotemia, possibly ATN. Developing after ureteral stent was placed.  GFR improving.  Nephrology signed off. -Continue to hold ACE and HCTZ - Continue intermittent albumin for hemodynamic support with severity of third-spacing.   CAD: s/p PTCA. No anginal complaints.  - holding ASA with thrombocytopenia and anemia.  Now that her blood pressure has improved and she remains tachycardic, still unable to take p.o. so I will resume her beta-blocker in the IV form with Lopressor 5 mg every 8 hours.  Hyperkalemia: Resolved  GERD - Continue PPI  Goals of care: Goals of Tx are palliative in nature, the patient is aware of this.  - Remains full code.   - Received chemotherapy for malignancy, wishes to continue this if offered.  - Broached possibility of palliative care consult by previous hospitalist which the patient would like to defer at this time.  Obesity: Estimated body mass index is 39.73 kg/m as calculated from the following:   Height as of this encounter: _0  (1.626 m).   Weight as of this encounter: 105 kg.  DVT prophylaxis: SCDs Code Status: Full Family Communication: Son by phone today Disposition Plan:  Status is: Inpatient  Remains inpatient appropriate because:Persistent severe electrolyte disturbances and Inpatient level of care appropriate due to severity of illness  Dispo: The patient is from: Home              Anticipated d/c is to: Home  Anticipated d/c date is: > 3 days              Patient currently is not medically stable to d/c.  Consultants:   Oncology  GynOnc  Nephrology  General surgery  GI  Interventional radiology  Procedures:  04/15/20 Dr. Alinda Money  CYSTOSCOPY WITH RETROGRADE PYELOGRAM/URETERAL STENT PLACEMENT    Cervical biopsy  Subjective: Patient seen and examined.  She has no complaints.  Objective: Vitals:   05/04/20 2142 05/05/20 0627 05/05/20 0910 05/05/20 1249  BP: 124/60 121/60 119/66 131/66  Pulse: (!) 108 (!) 102 (!) 104 (!) 108  Resp: _0 Temp: 98.4 F (36.9 C) 98.4 F (36.9 C) 98.9 F (37.2 C)   TempSrc: Oral Oral Oral   SpO2: 91% (!) 89%  91%  Weight:      Height:        Intake/Output Summary (Last 24 hours) at 05/05/2020 1253 Last data filed at 05/05/2020 0600 Gross per 24 hour  Intake 1300.33 ml  Output 1975 ml  Net -674.67 ml   Filed Weights   04/13/20 1234 04/24/20 0830 05/01/20 0838  Weight: 98 kg 98.2 kg 105 kg   General exam: Appears calm and comfortable but frail Respiratory system: Clear to auscultation. Respiratory effort normal. Cardiovascular system: S1 & S2 heard, RRR. No JVD, murmurs, rubs, gallops or clicks. No pedal edema. Gastrointestinal system: Abdomen is nondistended, soft and mild tenderness at PEG tube insertion site. No organomegaly or masses felt. Normal bowel sounds heard. Central nervous system: Alert and oriented. No focal neurological deficits. Extremities: Symmetric 5 x 5 power. Skin: No rashes, lesions or ulcers.  Psychiatry: Judgement and insight appear normal. Mood & affect appropriate.    Data Reviewed: I have personally reviewed following labs and imaging studies  CBC: Recent Labs  Lab 04/29/20 0829 04/29/20 0829 05/01/20 0408 05/02/20 1500 05/03/20 0405 05/04/20 0950 05/05/20 0331  WBC 1.3*  --  5.0 6.5  --  7.7 7.3  NEUTROABS 0.3*  --  3.0 4.5  --  6.0 6.0  HGB 10.0*   < > 7.7* 7.2* 8.8* 7.9* 7.1*  HCT 31.9*   < > 24.2* 22.2* 26.8* 24.0* 21.7*  MCV 86.7  --  87.1 85.7  --  83.9 85.4  PLT 87*  --  77* 79*  --  102* 115*   < > = values in this interval not displayed.   Basic Metabolic Panel: Recent Labs  Lab 05/01/20 0408 05/02/20 0337  05/03/20 0405 05/04/20 0348 05/05/20 0331  NA 137 137 139 142 146*  K 4.1 3.9 3.8 3.6 3.7  CL 109 106 108 106 106  CO2 20* 20* _1 GLUCOSE 134* 129* 135* 125* 132*  BUN 77* 85* 100* 96* 85*  CREATININE 1.62* 1.89* 1.92* 1.78* 1.41*  CALCIUM 8.2* 8.3* 8.1* 8.4* 8.7*  MG 2.1 2.2 2.3 2.4 2.4  PHOS 4.5 4.9* 4.6 3.3 2.7   GFR: Estimated Creatinine Clearance: 46.3 mL/min (A) (by C-G formula based on SCr of 1.41 mg/dL (H)). Liver Function Tests: Recent Labs  Lab 05/01/20 0408 05/02/20 0337 05/03/20 0405 05/04/20 0348  AST 39 _2 ALT _3 ALKPHOS 70 70 65 53  BILITOT 0.4 0.5 0.7 0.7  PROT 5.0* 5.0* 4.9* 5.1*  ALBUMIN 2.0* 1.9* 1.7* 2.3*   Coagulation Profile: Recent Labs  Lab 05/04/20 0348  INR 1.2   CBG: Recent Labs  Lab 05/03/20 2226 05/04/20 0734 05/04/20 1735  05/04/20 2358 05/05/20 0740  GLUCAP 120* 103* 119* 118* 131*   Urine analysis:    Component Value Date/Time   COLORURINE AMBER (A) 05/02/2020 1830   APPEARANCEUR CLOUDY (A) 05/02/2020 1830   LABSPEC 1.015 05/02/2020 1830   PHURINE 5.0 05/02/2020 1830   GLUCOSEU NEGATIVE 05/02/2020 1830   HGBUR LARGE (A) 05/02/2020 1830   [place] 05/02/2020 1830   [place] 05/02/2020 1830   PROTEINUR 30 (A) 05/02/2020 1830   NITRITE NEGATIVE 05/02/2020 1830   LEUKOCYTESUR SMALL (A) 05/02/2020 1830   Recent Results (from the past 240 hour(s))  Body fluid culture     Status: None (Preliminary result)   Collection Time: 05/03/20  1:30 PM   Specimen: PATH Cytology Pleural fluid  Result Value Ref Range Status   Specimen Description   Final    PLEURAL RIGHT Performed at [facility], [place] [address], [place], [place] [address]    Special Requests   Final    NONE Performed at [facility], [place] [address], [place], [place] [address]    Gram Stain   Final    RARE WBC PRESENT, PREDOMINANTLY MONONUCLEAR NO ORGANISMS SEEN    Culture    Final    NO GROWTH 2 DAYS Performed at [facility], [place] [address] [place], [place] [address]    Report Status PENDING  Incomplete      Radiology Studies: IR GASTROSTOMY TUBE MOD SED  Result Date: 05/05/2020 CLINICAL DATA:  Metastatic cervical carcinoma with intractable small-bowel obstruction requiring continuous nasogastric decompression. Bowel obstruction has improved with nasogastric decompression and request has been made to place a venting percutaneous gastrostomy tube so that the patient can be discharged from the hospital without a nasogastric tube. EXAM: PERCUTANEOUS GASTROSTOMY TUBE PLACEMENT ANESTHESIA/SEDATION: Formal moderate conscious sedation was not administered and the patient received 0.5 mg IV Versed for the procedure. CONTRAST:  88m OMNIPAQUE IOHEXOL 300 MG/ML  SOLN MEDICATIONS: No additional medications. FLUOROSCOPY TIME:  6 minutes and 54 seconds.  89.0 mGy. PROCEDURE: The procedure, risks, benefits, and alternatives were explained to the patient. Questions regarding the procedure were encouraged and answered. The patient understands and consents to the procedure. A time-out was performed prior to initiating the procedure. A 5-French catheter was then advanced through the patient's mouth under fluoroscopy into the esophagus and to the level of the stomach. This catheter was used to insufflate the stomach with air under fluoroscopy. The abdominal wall was prepped with Betadine in a sterile fashion, and a sterile drape was applied covering the operative field. A sterile gown and sterile gloves were used for the procedure. Local anesthesia was provided with 1% Lidocaine. A skin incision was made in the upper abdominal wall. Under fluoroscopy, an 18 gauge trocar needle was advanced into the stomach. Contrast injection was performed to confirm intraluminal position of the needle tip. A single T tack was then deployed in the lumen of the stomach. This was brought up to tension at  the skin surface. Over a guidewire, a 9-French sheath was advanced into the lumen of the stomach. The wire was left in place as a safety wire. A loop snare device from a percutaneous gastrostomy kit was then advanced into the stomach. A floppy guide wire was advanced through the orogastric catheter under fluoroscopy in the stomach. The loop snare advanced through the percutaneous gastric access was used to snare the guide wire. This allowed withdrawal of the loop snare out of the patient's mouth  by retraction of the orogastric catheter and wire. A 20-French bumper retention gastrostomy tube was looped around the snare device. It was then pulled back through the patient's mouth. The retention bumper was brought up to the anterior gastric wall. The T tack suture was cut at the skin. The exiting gastrostomy tube was cut to appropriate length and a feeding adapter applied. The catheter was injected with contrast material to confirm position and a fluoroscopic spot image saved. The tube was then flushed with saline. A dressing was applied over the gastrostomy exit site. COMPLICATIONS: None. FINDINGS: The stomach distended well with air allowing safe placement of the gastrostomy tube. After placement, the tip of the gastrostomy tube lies in the body of the stomach. IMPRESSION: Percutaneous gastrostomy with placement of a 20-French bumper retention tube in the body of the stomach. This tube can be immediately connected to wall suction as needed. Electronically Signed   By: Aletta Edouard M.D.   On: 05/05/2020 09:57   DG Chest Port 1 View  Result Date: 05/03/2020 CLINICAL DATA:  Status post right thoracentesis. EXAM: PORTABLE CHEST 1 VIEW COMPARISON:  04/30/2020 FINDINGS: Decreased right pleural effusion, no longer apparent on the right. Similar small left pleural effusion. No discernible pneumothorax on this semi erect study. Overlying left basilar opacity. Low lung volumes. Gastric tube courses below the diaphragm  with the tip likely in the stomach. Similar cardiac silhouette. Right PICC with the tip projecting at the distal SVC. IMPRESSION: 1. Decreased right pleural effusion, no longer apparent. No discernible pneumothorax. 2. Similar small left pleural effusion. Overlying left basilar opacity, which may represent atelectasis, aspiration, or pneumonia. Electronically Signed   By: Margaretha Sheffield MD   On: 05/03/2020 13:50   US THORACENTESIS ASP PLEURAL SPACE W/IMG GUIDE  Result Date: 05/03/2020 INDICATION: Patient with metastatic gynecologic cancer with carcinomatosis, ascites, and now right pleural effusion. Request made for diagnostic and therapeutic right thoracentesis. EXAM: ULTRASOUND GUIDED DIAGNOSTIC AND THERAPEUTIC RIGHT THORACENTESIS MEDICATIONS: 10 mL 1% lidocaine COMPLICATIONS: None immediate. PROCEDURE: An ultrasound guided thoracentesis was thoroughly discussed with the patient and questions answered. The benefits, risks, alternatives and complications were also discussed. The patient understands and wishes to proceed with the procedure. Written consent was obtained. Ultrasound was performed to localize and mark an adequate pocket of fluid in the right chest. The area was then prepped and draped in the normal sterile fashion. 1% Lidocaine was used for local anesthesia. Under ultrasound guidance a 6 Fr Safe-T-Centesis catheter was introduced. Thoracentesis was performed. The catheter was removed and a dressing applied. FINDINGS: A total of approximately 1.1 liters of clear, yellow fluid was removed. Samples were sent to the laboratory as requested by the clinical team. IMPRESSION: Successful ultrasound guided diagnostic and therapeutic right thoracentesis yielding 1.1 liters of pleural fluid. Read by: Brynda Greathouse PA-C Electronically Signed   By: Sandi Mariscal M.D.   On: 05/03/2020 14:49    Scheduled Meds: . sodium chloride   Intravenous Once  . Chlorhexidine Gluconate Cloth  6 each Topical Daily  .  insulin aspart  0-9 Units Subcutaneous Q8H  . pantoprazole (PROTONIX) IV  40 mg Intravenous Q24H  . sodium chloride flush  10-40 mL Intracatheter Q12H   Continuous Infusions: . TPN ADULT (ION) 75 mL/hr at 05/05/20 0143  . TPN ADULT (ION)       LOS: 21 days   Time spent: 36 minutes.  Darliss Cheney, MD [facility] [email or web address] 05/05/2020, 12:53 PM

## 2020-05-05 NOTE — Progress Notes (Signed)
PHARMACY - TOTAL PARENTERAL NUTRITION CONSULT NOTE   Indication: Prolonged ileus  Patient Measurements: Height: 5\' 4"  (162.6 cm) Weight: 105 kg (231 lb 7.7 oz) IBW/kg (Calculated) : 54.7 TPN AdjBW (KG): 65.5 Body mass index is 39.73 kg/m. Usual Weight: 98 kg  Assessment: 64 y/o F found to have metastatic gynecologic cancer with carcinomatosis and ureteral obstruction s/p stent placement with minimal oral intake for over a week, possible ileus. Pharmacy consulted to manage TPN.   Glucose / Insulin: No history of DM. - cbgs <150  - has not required any insulin from SSI in 24 hours.  Electrolytes: Na slightly elevated at 146; K 3.7, Phos wnl but trending down;  All other lytes WNL including CorrCa 10. Renal: SCr trending down with 1.41 (crcl~46), BUN 85 -  Nephrology following AKI. LFTs / TGs: LFTs WNL; TG 141 (8/30) Prealbumin / albumin: 8.8 (8/30)/ 2.3 (9/2) Intake / Output; MIVF: I/O -946; drain 850 - MIVF: none, NS stopped by MD on 8/29 GI Imaging:  -8/21 Abd x-ray: ileus vs obstruction -8/26 Abd x-ray: possible ileus, unable to exclude distal colonic obstruction -8/28 Abd x-ray: Ureteral stent is stable.  Continued air-filled prominent colon may represent ileus -8/30 Abd x-ray: Mildly dilated small bowel loops in right abdomen appear slightly increased. Stable moderate gaseous distention of the colon. Findings are compatible with persistent adynamic ileus.  -8/30 CT abd: Moderate volume intra-abdominal ascites. Suspected ileus, incompletely evaluated. Moderate sized bilateral pleural effusions with associated bibasilar atelectasis, right greater than left.  -8/31 Abd x-ray: Findings consistent with an underlying partial small bowel obstruction or ileus, similar to prior examination.  Surgeries / Procedures:  -8/14 ureteral stent placement  -8/31 paracentesis  -9/1 thoracentesis -9/2: G-tube placement Significant Events: -8/26: N/V, NGT placed. CLD > NPO  Central access: PICC  8/18 TPN start date: 8/23  Nutritional Goals (per RD recommendation on 9/2):  kCal: 1700-1900, Protein: 80-90g, Fluid: 1.8 L/d  Goal TPN rate is 75 mL/hr (provides 90 g of protein and 1821 kcals per day); meeting >100% of patient needs  Note:  Intralipid product ordered in TPN from 8/23-8/28.  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 Pharmacist was informed on 8/28 that SMOFlipids had been used to prepare the TPN from 8/23-8/27 despite the Intralipid orders and that Intralipid was not available for the TPN on 8/28. The order was changed from Intralipid to SMOFlipids. Will continue with SMOFlipids at this time, as it appears she has been tolerating for several days.   Current Nutrition:  TPN, NPO   Plan:   At 1800:  Continue TPN at goal rate of 75 mL/hr  Electrolytes in TPN:   Na 75mEq/L, K 87mEq/L, Ca 69mEq/L, Mag 5 mEq/L, Phos 10 mmol/L  Cl:Ac ratio: 1:1  Add standard MVI and trace elements to TPN  CBG check + sensitive SSI q8h  MIVF per MD (none at this time)   Monitor TPN labs on Monday/Thursday  BMET, phos, mag on 9/4  Lynelle Doctor, PharmD 05/05/20 7:34 AM

## 2020-05-05 NOTE — Progress Notes (Signed)
PT Cancellation Note  Patient Details Name: Eileen Burgess MRN: [id number] DOB: [birth year]   Cancelled Treatment:    Reason Eval/Treat Not Completed: Other (comment) Son present and reports pt just received "bad news."  Pt declines to mobilize at this time stating she needs to process information.  Will attempt to check back early next week if pt is willing to participate.   Obediah Welles,KATHrine E 05/05/2020, 3:12 PM Arlyce Dice, DPT Acute Rehabilitation Services Pager: [phone] Office: [phone]

## 2020-05-05 NOTE — Progress Notes (Signed)
Admit: 04/13/2020 LOS: 21  26F recent dx metastatic GYN Ca, prob cervical, AKI, FTT, malignant ileus/SBO  Subjective:  Marland Kitchen Gtube placed yesterday . SCrdown to 1.4 . UOP > 1L . [place] meeting today with oncology . Having sg GI output . Remains on TPN  09/02 0701 - 09/03 0700 In: 1654.2 [I.V.:1453.3; IV Piggyback:200.9] Out: 2600 [Urine:1150; Emesis/NG output:600; Drains:850]  Filed Weights   04/13/20 1234 04/24/20 0830 05/01/20 0838  Weight: 98 kg 98.2 kg 105 kg    Scheduled Meds: . sodium chloride   Intravenous Once  . Chlorhexidine Gluconate Cloth  6 each Topical Daily  . insulin aspart  0-9 Units Subcutaneous Q8H  . pantoprazole (PROTONIX) IV  40 mg Intravenous Q24H  . sodium chloride flush  10-40 mL Intracatheter Q12H   Continuous Infusions: . TPN ADULT (ION) 75 mL/hr at 05/05/20 0143  . TPN ADULT (ION)     PRN Meds:.acetaminophen **OR** acetaminophen, alteplase, alum & mag hydroxide-simeth, guaiFENesin-dextromethorphan, heparin lock flush, heparin lock flush, ipratropium-albuterol, nitroGLYCERIN, ondansetron **OR** ondansetron (ZOFRAN) IV, phenol, prochlorperazine, simethicone, sodium chloride flush, sodium chloride flush, sodium chloride flush  Current Labs: reviewed    Physical Exam:  Blood pressure 119/66, pulse (!) 104, temperature 98.9 F (37.2 C), temperature source Oral, resp. rate 20, height 5\' 4"  (1.626 m), weight 105 kg, SpO2 (!) 89 %. NAD NGT in place Regular no rub Coarse bs b/l Distended mildly, TTP mild Trace LEE  A 1. AKI, nl BL, likely multifactorial inpaired renal perfusion, maybe now with ATN; doubt abd compartment syndrome.  Nonoliguric,  recovering 2. 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 Pleural efusion s/p thoracentesis 9/1 4. Ascites, as above, s/p LVP 8/31 3.7L 5. Anemia / pancytopenia, from CTX, Onc following; improved 6. Acidosis, metabolic,  stable 7. Ileus, severe, s/t G tube 05/04/20  P . Cont supportive care . No extra IVFs, cont TPN . Consider albumin boluses for hypotension . Consider palliative involvement . No furthe rsuggestions, GFR recovering. No immediate need for formal outpt nephrology f/u, but can see if Onc/PMD see need. Will s/o at this time.  . Daily weights, Daily Renal Panel, Strict I/Os, Avoid nephrotoxins (NSAIDs, judicious IV Contrast)   Pearson Grippe MD 05/05/2020, 11:54 AM  Recent Labs  Lab 05/03/20 0405 05/04/20 0348 05/05/20 0331  NA 139 142 146*  K 3.8 3.6 3.7  CL 108 106 106  CO2 22 26 29   GLUCOSE 135* 125* 132*  BUN 100* 96* 85*  CREATININE 1.92* 1.78* 1.41*  CALCIUM 8.1* 8.4* 8.7*  PHOS 4.6 3.3 2.7   Recent Labs  Lab 05/02/20 1500 05/02/20 1500 05/03/20 0405 05/04/20 0950 05/05/20 0331  WBC 6.5  --   --  7.7 7.3  NEUTROABS 4.5  --   --  6.0 6.0  HGB 7.2*   < > 8.8* 7.9* 7.1*  HCT 22.2*   < > 26.8* 24.0* 21.7*  MCV 85.7  --   --  83.9 85.4  PLT 79*  --   --  102* 115*   < > = values in this interval not displayed.

## 2020-05-05 NOTE — Progress Notes (Signed)
GYN Oncology Follow Up  Patient states she is doing better. No nausea or emesis reported.  Mild discomfort reported around G tube but discomfort is tolerable.  Unsure whether she has passed flatus (rectal tube in). Reports breathing feels improved. She has not been out of bed or sat in the chair. Her son is coming in today for a goals discussion.  Performed by Dr. Darene Lamer: Patient alert, oriented, in acute distress. Clear breath sounds, no wheezing. Abdomen remains distended. Hypoactive bowel sounds. Abdomen tympanic. Mild lower extrem pedal edema. NG tube capped. G tube attached to suction canister but red suction tubing was disconnected from the canister.  Reconnected and immediately 200 cc came out through G tube. Rectal tube with no drainage in the bag.   CBC    Component Value Date/Time   WBC 7.3 05/05/2020 0331   RBC 2.54 (L) 05/05/2020 0331   HGB 7.1 (L) 05/05/2020 0331   HCT 21.7 (L) 05/05/2020 0331   PLT 115 (L) 05/05/2020 0331   MCV 85.4 05/05/2020 0331   MCH 28.0 05/05/2020 0331   MCHC 32.7 05/05/2020 0331   RDW 15.5 05/05/2020 0331   LYMPHSABS 0.8 05/05/2020 0331   MONOABS 0.3 05/05/2020 0331   EOSABS 0.0 05/05/2020 0331   BASOSABS 0.0 05/05/2020 0331   BMET    Component Value Date/Time   NA 146 (H) 05/05/2020 0331   NA 136 06/06/2017 1044   K 3.7 05/05/2020 0331   CL 106 05/05/2020 0331   CO2 29 05/05/2020 0331   GLUCOSE 132 (H) 05/05/2020 0331   BUN 85 (H) 05/05/2020 0331   BUN 14 06/06/2017 1044   CREATININE 1.41 (H) 05/05/2020 0331   CREATININE 1.03 (H) 06/14/2016 1012   CALCIUM 8.7 (L) 05/05/2020 0331   GFRNONAA 39 (L) 05/05/2020 0331   GFRAA 45 (L) 05/05/2020 0331     Plan for NG removal this afternoon as long as G tube is still draining. Dr. Alvy Bimler to speak with son and patient this afternoon. Discussed with patient about increasing mobility. Continue plan of care per Hospitalist, Dr. Alvy Bimler, and Dr. Berline Lopes. GI and neph following as well.

## 2020-05-05 NOTE — Progress Notes (Addendum)
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, biopsy showed adenocarcinoma Received cycle 1 of carboplatin and paclitaxel on 04/20/2020 and tolerated well We will continue to monitor for side effects of treatment and monitor lab work closely She developed pancytopenia secondary to chemotherapy-WBC recovering but has had persistent anemia and thrombocytopenia-repeat CBC from today shows mild anemia again Continue supportive care for now Next dose of chemotherapy would be around September 9, however, given her significant decline in performance status, it is not clear to me that she is a candidate for further treatment I have a long discussion with the patient and her son If her performance status is unchanged by next week, along with worsening renal function, I would recommend her to go to hospice They are aware of potential bad news next week   Pancytopenia secondary to chemotherapy Recommend transfusion if hemoglobin is less than 8 and platelet count less than 10 she received 1 unit of blood transfusion on August 31  Hemoglobin down to 7.1 this morning and will give 1 unit PRBCs today Recommend transfusion as needed to keep hemoglobin   AKI due to hydronephrosis and possible dehydration/3rd spacing Renal function slowly improving nephrology consulted, will defer to nephrologist for management  Protein calorie malnutrition evidence of 3rd spacing in addition to poor oral intake Dietitian is following Continue TPN   subacute bowel obstruction secondary to carcinomatosis NG tube is in for almost a week G-tube was placed by IR on 05/04/2020 and is hooked to suction G-tube is strictly palliative in nature Recommend DC of NG tube later today if g-tube draining  well  Ascites Moderate ascites noted on CT scan she had repeat paracentesis on August 31  Recent shortness of breath/respiratory distress/vascular congestion with pulmonary edema, resolved Remains off oxygen and breathing has improved She has received Lasix on several occasions this admission Status post thoracentesis on 05/03/2020 with improvement of her breathing   Goals of care discussion She is aware she have stage IV disease Treatment goal is palliative The goal for treatment in this hospital stay is to resolve her bowel obstruction prior to discharge Overall, she has significant decline in performance status I have been trying to get the patient to participate with physical therapy, sitting on the chair and move around as much as possible Over the past week, she remains bedbound Spoke with son, Ewelina Naves, today with extensive discussions of goals of care I recommend introduction of palliative care consult while she is here in case she is discharged next week on hospice  Discharge planning She will likely be here for the next 3 to 5 days I have informed the patient she will likely not be a candidate for further treatment if she continues to decline She understands  All questions were answered. The patient knows to call the clinic with any problems, questions or concerns.   Mikey Bussing, NP 05/05/2020 8:06 AM  [name], MD  Subjective:  The patient feels a little bit better this morning She is more awake and alert g-tube hooked to suction and draining well She has no nausea or vomiting Rectal tube remains in place without any recorded output Abdomen less distended Reports breathing is stable  Objective:  Vitals:   05/04/20 2142 05/05/20 0627  BP: 124/60 121/60  Pulse: (!) 108 (!) 102  Resp: 20 20  Temp: 98.4 F (36.9 C) 98.4 F (36.9 C)  SpO2: 91% (!) 89%     Intake/Output Summary (Last 24 hours) at 05/05/2020 0806 Last data filed at 05/05/2020  0600 Gross per 24 hour  Intake 1654.18 ml  Output 2600 ml  Net -945.82 ml    GENERAL: Awake and alert, no distress HEART: regular rate & rhythm and no murmurs and no lower extremity edema ABDOMEN:abdomen distended but softer compared to prior exam, her bowel sounds are less active compared to yesterday's exam Musculoskeletal:no cyanosis of digits and no clubbing  NEURO: alert & oriented x 3 with fluent speech, but somewhat sleepy   Labs:  Recent Labs    05/02/20 0337 05/02/20 0337 05/03/20 0405 05/04/20 0348 05/05/20 0331  NA 137   < > 139 142 146*  K 3.9   < > 3.8 3.6 3.7  CL 106   < > 108 106 106  CO2 20*   < > 22 26 29   GLUCOSE 129*   < > 135* 125* 132*  BUN 85*   < > 100* 96* 85*  CREATININE 1.89*   < > 1.92* 1.78* 1.41*  CALCIUM 8.3*   < > 8.1* 8.4* 8.7*  GFRNONAA 27*   < > 27* 29* 39*  GFRAA 31*   < > 31* 34* 45*  PROT 5.0*  --  4.9* 5.1*  --   ALBUMIN 1.9*  --  1.7* 2.3*  --   AST 31  --  25 21  --   ALT 28  --  25 17  --   ALKPHOS 70  --  65 53  --   BILITOT 0.5  --  0.7 0.7  --    < > = values in this interval not displayed.    Studies:  CT ABDOMEN WO CONTRAST  Result Date: 05/02/2020 CLINICAL DATA:  Evaluate anatomy prior to potential percutaneous gastrostomy tube placement. EXAM: CT ABDOMEN WITHOUT CONTRAST TECHNIQUE: Multidetector CT imaging of the abdomen was performed following the standard protocol without IV contrast. COMPARISON:  CT abdomen pelvis - 04/14/2020; abdominal radiograph-05/01/2020; 04/29/2020; 04/27/2020 FINDINGS: Lack of intravenous contrast limits the ability to evaluate solid abdominal organs. Lower chest: Limited visualization of the lower thorax demonstrates small to moderate-sized bilateral pleural effusions with associated bibasilar consolidative opacities, right greater than left. Borderline cardiomegaly.  Small pericardial effusion. Hepatobiliary: There is mild nodularity of the hepatic contour suggestive of cirrhotic change. Normal  noncontrast appearance of the gallbladder given degree distention. No radiopaque gallstones. Moderate volume intra-abdominal ascites. Pancreas: Normal noncontrast appearance of the pancreas. Spleen: Normal noncontrast appearance of the spleen. Adrenals/Urinary Tract: Post right-sided ureteral stent placement. The right kidney is slightly atrophic in comparison to the left. No renal stones. There is a minimal amount likely age and body habitus related bilateral perinephric stranding. No urine obstruction. Normal noncontrast appearance the bilateral adrenal glands. The urinary bladder was not imaged. Stomach/Bowel: The anterior wall of the gastric antrum is well apposed against the ventral wall of the upper abdomen without interposition of the hepatic parenchyma or transverse colon. Enteric tube tip terminates within the descending portion of the duodenum. There is moderate distension majority of the visualized loops of large and small bowel, similar to abdominal radiographs, incompletely evaluated though potentially the sequela of ileus. No pneumoperitoneum, pneumatosis or portal venous gas. Vascular/Lymphatic: Moderate amount of atherosclerotic plaque within a normal caliber abdominal aorta. No bulky retroperitoneal or mesenteric adenopathy  on this noncontrast examination. Other: Moderate to large amount of diffuse body wall anasarca, most conspicuous about the midline of the low back and bilateral flanks. Musculoskeletal: No acute or aggressive osseous abnormalities. Stigmata of dish within the lower thoracic spine. IMPRESSION: 1. Despite amenable gastric anatomy, the presence moderate volume intra-abdominal ascites renders the patient a poor candidate for gastrostomy tube placement. 2. Suspected ileus, incompletely evaluated. 3. Moderate sized bilateral pleural effusions with associated bibasilar atelectasis, right greater than left. 4. Small pericardial effusion. Further evaluation cardiac echo could be performed  as indicated. 5.  Aortic Atherosclerosis (ICD10-I70.0). 6. Post right-sided ureteral stent placement with mild asymmetric atrophy of the right kidney comparison to left. No evidence of urinary obstruction. Electronically Signed   By: Sandi Mariscal M.D.   On: 05/02/2020 08:14   DG Chest 1 View  Result Date: 04/30/2020 CLINICAL DATA:  Shortness of breath and cough. EXAM: CHEST  1 VIEW COMPARISON:  Chest radiograph dated 04/28/2020. FINDINGS: The heart size and mediastinal contours are within normal limits. Opacity of the right hemithorax likely reflects a layering pleural effusion with associated atelectasis. A small left pleural effusion with associated atelectasis is noted. Mild-to-moderate bibasilar airspace opacities likely contribute. Mild diffuse bilateral interstitial opacities are noted. There is no pneumothorax. A right upper extremity peripherally inserted central venous catheter tip overlies the superior vena cava. An enteric tube enters the stomach and terminates below the field of view. IMPRESSION: Bilateral pleural effusions with associated atelectasis and/or airspace opacities. Mild diffuse bilateral interstitial opacities may represent pulmonary edema or infection. Electronically Signed   By: Zerita Boers M.D.   On: 04/30/2020 15:04   DG Chest 1 View  Result Date: 04/20/2020 CLINICAL DATA:  Increasing short of breath EXAM: CHEST  1 VIEW COMPARISON:  04/13/2020, CT 04/15/2020 FINDINGS: Right upper extremity central venous catheter tip over the SVC. Cardiomegaly with vascular congestion and diffuse interstitial opacity consistent with edema. Moderate right-sided pleural effusion, likely layering and resulting in hazy asymmetric appearance of the right thorax. At least small left pleural effusion. Bibasilar consolidations. No pneumothorax. IMPRESSION: 1. Cardiomegaly with vascular congestion and diffuse interstitial pulmonary edema. 2. Bilateral pleural effusions, at least moderate on the right and  small on the left. 3. Bibasilar consolidations Electronically Signed   By: Donavan Foil M.D.   On: 04/20/2020 22:20   DG Chest 2 View  Result Date: 04/13/2020 CLINICAL DATA:  Weakness diarrhea EXAM: CHEST - 2 VIEW COMPARISON:  07/28/2004 FINDINGS: Heart size upper normal.  Vascularity normal.  Coronary stent noted. Small bilateral pleural effusions. Mild bibasilar atelectasis/infiltrate. IMPRESSION: Interval development of mild bibasilar airspace disease and small pleural effusions. Negative for heart failure. Electronically Signed   By: Franchot Gallo M.D.   On: 04/13/2020 13:57   DG Abd 1 View  Result Date: 05/03/2020 CLINICAL DATA:  Nasogastric tube placement EXAM: ABDOMEN - 1 VIEW COMPARISON:  Portable exam 1214 hours compared 05/02/2020 FINDINGS: Tip of nasogastric tube projects over mid stomach. Upper normal heart size with pulmonary vascular congestion. Mediastinal contours normal. RIGHT arm PICC line tip projects over SVC. LEFT lower lobe atelectasis versus consolidation. Moderate RIGHT pleural effusion and basilar atelectasis. Gas in colon. RIGHT ureteral stent visualized. IMPRESSION: Tip of nasogastric tube projects over mid stomach. Persistent LEFT lower lobe atelectasis versus consolidation. Moderate RIGHT pleural effusion. Electronically Signed   By: Lavonia Dana M.D.   On: 05/03/2020 12:48   DG Abd 1 View  Result Date: 05/01/2020 CLINICAL DATA:  Follow-up ileus EXAM: ABDOMEN -  1 VIEW COMPARISON:  04/29/2020 abdominal radiograph FINDINGS: Enteric tube terminates in the descending duodenum. Stable right nephroureteral stent. Mildly dilated small bowel loops in right abdomen appears slightly increased. Moderate gaseous distention of the colon appears similar. No evidence of pneumatosis or pneumoperitoneum. No radiopaque nephrolithiasis. IMPRESSION: 1. Enteric tube terminates in the descending duodenum. 2. Mildly dilated small bowel loops in right abdomen appear slightly increased. Stable  moderate gaseous distention of the colon. Findings are compatible with persistent adynamic ileus. Electronically Signed   By: Ilona Sorrel M.D.   On: 05/01/2020 10:05   DG Abd 1 View  Result Date: 04/29/2020 CLINICAL DATA:  Follow-up ileus EXAM: ABDOMEN - 1 VIEW COMPARISON:  April 27, 2020 FINDINGS: A right ureteral stent is stable. In NG tube is identified. I suspect the distal tip may be in the proximal duodenum. Air-filled prominent colon remains. No small bowel dilatation. No other acute abnormalities. IMPRESSION: 1. Support apparatus as above. 2. Continued air-filled prominent colon may represent ileus. No other abnormalities. Electronically Signed   By: Dorise Bullion III M.D   On: 04/29/2020 09:40   DG Abd 1 View  Result Date: 04/27/2020 CLINICAL DATA:  Obstruction EXAM: ABDOMEN - 1 VIEW COMPARISON:  Portable exam 1047 hours compared 04/22/2020 FINDINGS: RIGHT ureteral stent unchanged. Gaseous distention of colon from tip of cecum through splenic flexure. Absent gas within descending and rectosigmoid colon. Small bowel gas pattern normal. No bowel wall thickening or pathologic calcification. IMPRESSION: Mild gaseous distention of the proximal half the colon with absent gas distally, could reflect ileus but unable to exclude distal colonic obstruction with this appearance. Electronically Signed   By: Lavonia Dana M.D.   On: 04/27/2020 11:01   CT CHEST W CONTRAST  Result Date: 04/15/2020 CLINICAL DATA:  Cancer of unknown primary, staging. EXAM: CT CHEST WITH CONTRAST TECHNIQUE: Multidetector CT imaging of the chest was performed during intravenous contrast administration. CONTRAST:  47mL OMNIPAQUE IOHEXOL 300 MG/ML  SOLN COMPARISON:  None. FINDINGS: Cardiovascular: There is mild calcification of the aortic arch. Normal heart size. No pericardial effusion. A coronary artery stent is seen. Mediastinum/Nodes: No enlarged mediastinal, hilar, or axillary lymph nodes. The thyroid gland and trachea  demonstrate no significant findings. There is a small hiatal hernia. Lungs/Pleura: Mild bilateral lower lobe atelectasis is seen, right slightly greater than left. Very mild slightly nodular appearing scarring and/or atelectasis is seen along the posterior aspect of the inferior left upper lobe. There is a small left pleural effusion. A moderate sized right pleural effusion is also noted. No pneumothorax is identified. Upper Abdomen: A moderate amount of abdominal free fluid is seen. There is moderate to marked severity right-sided hydronephrosis with delayed renal cortical enhancement involving the right kidney. Musculoskeletal: No chest wall abnormality. No acute or significant osseous findings. Degenerative changes seen throughout the thoracic spine. IMPRESSION: 1. Small left pleural effusion with a moderate sized right pleural effusion. 2. Mild bilateral lower lobe atelectasis, right slightly greater than left. 3. Very mild slightly nodular appearing scarring and/or atelectasis along the posterior aspect of the inferior left upper lobe. 4. Moderate amount of abdominal free fluid. 5. Moderate to marked severity right-sided hydronephrosis with delayed renal cortical enhancement involving the right kidney. This is suggestive of partial renal obstruction. 6. Small hiatal hernia. 7. Aortic atherosclerosis. Aortic Atherosclerosis (ICD10-I70.0). Electronically Signed   By: Virgina Norfolk M.D.   On: 04/15/2020 15:38   US Paracentesis  Result Date: 05/02/2020 INDICATION: Patient with history of stage IV cervical cancer/adenocarcinoma,  carcinomatosis, ascites. Request made for diagnostic and therapeutic paracentesis. EXAM: ULTRASOUND GUIDED DIAGNOSTIC AND THERAPEUTIC PARACENTESIS MEDICATIONS: 1% lidocaine to skin and subcutaneous tissue COMPLICATIONS: None immediate. PROCEDURE: Informed written consent was obtained from the patient after a discussion of the risks, benefits and alternatives to treatment. A timeout  was performed prior to the initiation of the procedure. Initial ultrasound scanning demonstrates a large amount of ascites within the right lower abdominal quadrant. The right lower abdomen was prepped and draped in the usual sterile fashion. 1% lidocaine was used for local anesthesia. Following this, a 19 gauge, 10-cm, Yueh catheter was introduced. An ultrasound image was saved for documentation purposes. The paracentesis was performed. The catheter was removed and a dressing was applied. The patient tolerated the procedure well without immediate post procedural complication. FINDINGS: A total of approximately 3.7 liters of slightly hazy, amber fluid was removed. Samples were sent to the laboratory as requested by the clinical team. IMPRESSION: Successful ultrasound-guided diagnostic and therapeutic paracentesis yielding 3.7 liters of peritoneal fluid. Read by: Rowe Robert, PA-C Electronically Signed   By: Jerilynn Mages.  Shick M.D.   On: 05/02/2020 11:15   DG Chest Port 1 View  Result Date: 05/03/2020 CLINICAL DATA:  Status post right thoracentesis. EXAM: PORTABLE CHEST 1 VIEW COMPARISON:  04/30/2020 FINDINGS: Decreased right pleural effusion, no longer apparent on the right. Similar small left pleural effusion. No discernible pneumothorax on this semi erect study. Overlying left basilar opacity. Low lung volumes. Gastric tube courses below the diaphragm with the tip likely in the stomach. Similar cardiac silhouette. Right PICC with the tip projecting at the distal SVC. IMPRESSION: 1. Decreased right pleural effusion, no longer apparent. No discernible pneumothorax. 2. Similar small left pleural effusion. Overlying left basilar opacity, which may represent atelectasis, aspiration, or pneumonia. Electronically Signed   By: Margaretha Sheffield MD   On: 05/03/2020 13:50   DG Chest Port 1 View  Result Date: 04/28/2020 CLINICAL DATA:  64 year old female with persistent cough. Query fluid overload. EXAM: PORTABLE CHEST 1 VIEW  COMPARISON:  Portable chest 04/20/2020 and earlier. FINDINGS: Portable AP semi upright view at 1214 hours. Stable right PICC line. Enteric tube has been placed and courses to the stomach, tip not included. Improved bilateral ventilation, but continued basilar predominant increased interstitial opacity, and dense retrocardiac opacity. Veiling opacity on the right is compatible with a superimposed pleural effusion. No pneumothorax. Upper lung pulmonary vascularity appears more normal now. Mediastinal contours are stable and within normal limits. Dense retrocardiac opacity without air bronchograms. Paucity of bowel gas. No acute osseous abnormality identified. IMPRESSION: 1. Suspect regression of pulmonary edema since 04/20/2020, but with residual vascular congestion, small right pleural effusion, and superimposed left lower lobe collapse or consolidation. 2. Enteric tube placed into the stomach, tip not included. Electronically Signed   By: Genevie Ann M.D.   On: 04/28/2020 12:35   DG Abd 2 Views  Result Date: 05/02/2020 CLINICAL DATA:  Weakness, diarrhea EXAM: ABDOMEN - 2 VIEW COMPARISON:  05/01/2020 FINDINGS: Nasogastric tube likely extends to the second portion of the duodenum with its proximal side hole in the region of the pylorus. This is unchanged from prior examination. Dilated loops of small bowel are again seen within the mid abdomen and gas is seen within multiple nondilated loops of large bowel in keeping with changes of an underlying of partial small bowel obstruction or ileus. No gross free intraperitoneal gas. Moderate right pleural effusion again noted. Right double-J ureteral stent overlies its expected position. Pelvis excluded  from view. IMPRESSION: 1. Findings consistent with an underlying partial small bowel obstruction or ileus, similar to prior examination. 2. Nasogastric tube tip in the second portion of the duodenum. Electronically Signed   By: Fidela Salisbury MD   On: 05/02/2020 19:27   DG  Abd Portable 1V  Result Date: 04/27/2020 CLINICAL DATA:  Nasogastric tube placement EXAM: PORTABLE ABDOMEN - 1 VIEW COMPARISON:  04/27/2020 FINDINGS: Interval placement of esophagogastric tube, tip and side port below the diaphragm, looped in the gastric fundus. Diffusely distended colon similar to prior examination. Partially imaged right double-J ureteral stent. IMPRESSION: 1. Interval placement of esophagogastric tube, tip and side port below the diaphragm, looped in the gastric fundus. 2. Diffusely distended colon similar to prior examination. Electronically Signed   By: Eddie Candle M.D.   On: 04/27/2020 13:33   DG Abd Portable 2V  Result Date: 04/22/2020 CLINICAL DATA:  Decreased bowel sounds EXAM: PORTABLE ABDOMEN - 2 VIEW COMPARISON:  04/21/2020 FINDINGS: Supine frontal views of the abdomen and pelvis are obtained. Right ureteral stent is stable. There is increasing gaseous distention of the colon, most compatible with ileus. No evidence of small-bowel obstruction. No masses or abnormal calcifications. IMPRESSION: 1. Progressive gaseous distention of the colon most consistent with ileus. 2. Stable right ureteral stent. Electronically Signed   By: Randa Ngo M.D.   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 Hypoactive bowel sounds. EXAM: PORTABLE ABDOMEN - 2 VIEW COMPARISON:  KUB 04/20/2020.  CT Abdomen and Pelvis 04/14/2020. FINDINGS: Upright and supine views of the abdomen and pelvis. Stable right double-J ureteral stent. Mildly to moderately gas distended large bowel again noted, with gas present to the mid sigmoid. As before, no dilated small bowel loops. But gas is increased since 04/14/2020. No pneumoperitoneum. Stable lung bases, right pleural effusion again evident. Stable visualized osseous structures. IMPRESSION: 1. Continued gas distended colon to the sigmoid, although no  dilated small bowel to strongly suggest mechanical obstruction. Continue to favor ileus. 2. No free air. Stable right ureteral stent. Persistent right pleural effusion. Electronically Signed   By: Genevie Ann M.D.   On: 04/21/2020 13:54   DG Abd Portable 2V  Result Date: 04/20/2020 CLINICAL DATA:  Hypoactive bowel sounds. EXAM: PORTABLE ABDOMEN - 2 VIEW COMPARISON:  No prior. FINDINGS: Double-J right ureteral stent in good anatomic position. Colon is slightly distended. Cecum measures 8.9 cm in diameter. Although these findings most likely represent colonic ileus follow-up exam suggested to exclude colonic obstruction. No small bowel distention. No free air. Bibasilar atelectasis/infiltrates. Small right pleural effusion. IMPRESSION: 1.  Double-J right ureteral stent in good anatomic position. 2. Colon is slightly distended. Cecum measures 8.9 cm. Although these findings most likely represent colonic ileus, follow-up exam suggested to exclude colonic obstruction. No small bowel distention. No free air. 3.  Bibasilar atelectasis/infiltrates. Electronically Signed   By: Marcello Moores  Register   On: 04/20/2020 13:00   DG C-Arm 1-60 Min-No Report  Result Date: 04/15/2020 Fluoroscopy was utilized by the requesting physician.  No radiographic interpretation.   CT RENAL STONE STUDY  Result Date: 04/14/2020 CLINICAL DATA:  64 year old female with hematuria. EXAM: CT ABDOMEN AND PELVIS WITHOUT CONTRAST TECHNIQUE: Multidetector CT imaging of the abdomen and pelvis was performed following the standard protocol without IV contrast. COMPARISON:  None. FINDINGS: Evaluation of this exam is limited in the absence of intravenous contrast. Lower  chest: Partially visualized moderate right and small left pleural effusions. There is associated partial compressive atelectasis of the lower lobes. Pneumonia is not excluded. Clinical correlation is recommended. There is coronary vascular calcification. Partially visualized trace  pericardial effusion. There is no intra-abdominal free air. Small ascites. Hepatobiliary: No focal liver abnormality is seen. No gallstones, gallbladder wall thickening, or biliary dilatation. Pancreas: Unremarkable. No pancreatic ductal dilatation or surrounding inflammatory changes. Spleen: Normal in size without focal abnormality. Adrenals/Urinary Tract: The adrenal glands unremarkable. The left kidney is unremarkable. There is a moderate right hydronephrosis with mild right hydroureter. No stone identified. There is a transition point in the distal right ureter or right UVJ. The urinary bladder is minimally distended. There is possible mild nodular thickening of the posterior bladder wall adjacent to the right UVJ (77/2). There is loss of fat plane between the anterior lower uterus/cervix and posterior bladder wall. The lower uterus/cervical region is somewhat enlarged. Findings concerning for a neoplastic process either arising from the bladder urothelium or from the cervix and invading into the posterior bladder wall with obstruction of the right UVJ or distal right ureter. Stomach/Bowel: There is a small hiatal hernia. There is no bowel obstruction. There is sigmoid diverticulosis. The appendix is not visualized with certainty. No inflammatory changes identified in the right lower quadrant. Vascular/Lymphatic: Moderate aortoiliac atherosclerotic disease. The IVC is unremarkable. No portal venous gas. There is no adenopathy. Reproductive: The uterus is anteverted. Enlargement of the lower uterus/cervical region as described above. Other: There is diffuse omental nodularity and caking consistent with metastatic disease. Musculoskeletal: Osteopenia. No acute osseous pathology. IMPRESSION: 1. Findings concerning for a neoplastic process either arising from the bladder urothelium or from the cervix and invading into the posterior bladder wall with obstruction of the right UVJ or distal right ureter. There is  associated moderate right hydronephrosis. Sampling of the ascitic fluid may provide further diagnostic information. 2. Diffuse omental nodularity and caking consistent with metastatic disease. 3. Moderate right and small left pleural effusions with associated partial compressive atelectasis of the lower lobes. Pneumonia is not excluded. Clinical correlation is recommended. 4. Sigmoid diverticulosis. No bowel obstruction. 5. Aortic Atherosclerosis (ICD10-I70.0). Electronically Signed   By: Anner Crete M.D.   On: 04/14/2020 17:44   [place] EKG SITE RITE  Result Date: 04/18/2020 If Site Rite image not attached, placement could not be confirmed due to current cardiac rhythm.  US THORACENTESIS ASP PLEURAL SPACE W/IMG GUIDE  Result Date: 05/03/2020 INDICATION: Patient with metastatic gynecologic cancer with carcinomatosis, ascites, and now right pleural effusion. Request made for diagnostic and therapeutic right thoracentesis. EXAM: ULTRASOUND GUIDED DIAGNOSTIC AND THERAPEUTIC RIGHT THORACENTESIS MEDICATIONS: 10 mL 1% lidocaine COMPLICATIONS: None immediate. PROCEDURE: An ultrasound guided thoracentesis was thoroughly discussed with the patient and questions answered. The benefits, risks, alternatives and complications were also discussed. The patient understands and wishes to proceed with the procedure. Written consent was obtained. Ultrasound was performed to localize and mark an adequate pocket of fluid in the right chest. The area was then prepped and draped in the normal sterile fashion. 1% Lidocaine was used for local anesthesia. Under ultrasound guidance a 6 Fr Safe-T-Centesis catheter was introduced. Thoracentesis was performed. The catheter was removed and a dressing applied. FINDINGS: A total of approximately 1.1 liters of clear, yellow fluid was removed. Samples were sent to the laboratory as requested by the clinical team. IMPRESSION: Successful ultrasound guided diagnostic and therapeutic right  thoracentesis yielding 1.1 liters of pleural fluid. Read by: Brynda Greathouse  PA-C Electronically Signed   By: Sandi Mariscal M.D.   On: 05/03/2020 14:49

## 2020-05-05 NOTE — Progress Notes (Signed)
Referring Physician(s): [name]  Supervising Physician: Daryll Brod  Patient Status:  [facility] - In-pt  Chief Complaint:  Metastatic cervical cancer, bowel obstruction, recent nausea/vomiting  Subjective: Pt doing ok this am; sore at G tube site as expected; NG remains in place, draining bilious fluid; denies N/V at present; no worsening resp symptoms   Allergies: Shrimp [shellfish allergy], Crestor [rosuvastatin], Pravastatin, Vytorin [ezetimibe-simvastatin], and Wasp venom  Medications: Prior to Admission medications   Medication Sig Start Date End Date Taking? Authorizing Provider  acetaminophen (TYLENOL) 500 MG tablet Take 500-1,000 mg by mouth every 6 (six) hours as needed for mild pain or moderate pain.   Yes [provider]  hydrochlorothiazide (MICROZIDE) 12.5 MG capsule TAKE 1 CAPSULE BY MOUTH DAILY Patient taking differently: Take 12.5 mg by mouth daily.  03/07/20  Yes [name], MD  Ibuprofen-Acetaminophen (ADVIL DUAL ACTION) 125-250 MG TABS Take 2 tablets by mouth every 8 (eight) hours as needed (pain).   Yes [provider]  lisinopril (ZESTRIL) 20 MG tablet TAKE 1 TABLET BY MOUTH DAILY Patient taking differently: Take 20 mg by mouth daily.  03/07/20  Yes [name], MD  metoprolol succinate (TOPROL-XL) 25 MG 24 hr tablet Take 1 tablet (25 mg total) by mouth daily. 09/08/19  Yes [name], MD  nitroGLYCERIN (NITROSTAT) 0.4 MG SL tablet TAKE 1 TABLET UNDER THE TONGUE AS NEEDEDFOR CHEST PAIN UP TO 3 TIMES AN EPISODE Patient taking differently: Place 0.4 mg under the tongue every 5 (five) minutes x 3 doses as needed for chest pain.  06/16/18  Yes [name], MD  Alirocumab (PRALUENT) 150 MG/ML SOAJ Inject 150 mg into the skin every 14 (fourteen) days. Patient not taking: Reported on 04/13/2020 06/02/19   [name], MD     Vital Signs: BP 119/66 (BP Location: Left Arm)   Pulse (!) 104   Temp 98.9 F (37.2 C) (Oral)    Resp 20   Ht _0  (1.626 m)   Wt 231 lb 7.7 oz (105 kg)   LMP  (LMP Unknown)   SpO2 (!) 89%   BMI 39.73 kg/m   Physical Exam awake/alert; G tube intact, insertion site ok, abd soft, mildly tender, currently to suction  Imaging: CT ABDOMEN WO CONTRAST  Result Date: 05/02/2020 CLINICAL DATA:  Evaluate anatomy prior to potential percutaneous gastrostomy tube placement. EXAM: CT ABDOMEN WITHOUT CONTRAST TECHNIQUE: Multidetector CT imaging of the abdomen was performed following the standard protocol without IV contrast. COMPARISON:  CT abdomen pelvis - 04/14/2020; abdominal radiograph-05/01/2020; 04/29/2020; 04/27/2020 FINDINGS: Lack of intravenous contrast limits the ability to evaluate solid abdominal organs. Lower chest: Limited visualization of the lower thorax demonstrates small to moderate-sized bilateral pleural effusions with associated bibasilar consolidative opacities, right greater than left. Borderline cardiomegaly.  Small pericardial effusion. Hepatobiliary: There is mild nodularity of the hepatic contour suggestive of cirrhotic change. Normal noncontrast appearance of the gallbladder given degree distention. No radiopaque gallstones. Moderate volume intra-abdominal ascites. Pancreas: Normal noncontrast appearance of the pancreas. Spleen: Normal noncontrast appearance of the spleen. Adrenals/Urinary Tract: Post right-sided ureteral stent placement. The right kidney is slightly atrophic in comparison to the left. No renal stones. There is a minimal amount likely age and body habitus related bilateral perinephric stranding. No urine obstruction. Normal noncontrast appearance the bilateral adrenal glands. The urinary bladder was not imaged. Stomach/Bowel: The anterior wall of the gastric antrum is well apposed against the ventral wall of the upper abdomen without interposition of  the hepatic parenchyma or transverse colon. Enteric tube tip terminates within the descending portion of the duodenum.  There is moderate distension majority of the visualized loops of large and small bowel, similar to abdominal radiographs, incompletely evaluated though potentially the sequela of ileus. No pneumoperitoneum, pneumatosis or portal venous gas. Vascular/Lymphatic: Moderate amount of atherosclerotic plaque within a normal caliber abdominal aorta. No bulky retroperitoneal or mesenteric adenopathy on this noncontrast examination. Other: Moderate to large amount of diffuse body wall anasarca, most conspicuous about the midline of the low back and bilateral flanks. Musculoskeletal: No acute or aggressive osseous abnormalities. Stigmata of dish within the lower thoracic spine. IMPRESSION: 1. Despite amenable gastric anatomy, the presence moderate volume intra-abdominal ascites renders the patient a poor candidate for gastrostomy tube placement. 2. Suspected ileus, incompletely evaluated. 3. Moderate sized bilateral pleural effusions with associated bibasilar atelectasis, right greater than left. 4. Small pericardial effusion. Further evaluation cardiac echo could be performed as indicated. 5.  Aortic Atherosclerosis (ICD10-I70.0). 6. Post right-sided ureteral stent placement with mild asymmetric atrophy of the right kidney comparison to left. No evidence of urinary obstruction. Electronically Signed   By: Sandi Mariscal M.D.   On: 05/02/2020 08:14   DG Abd 1 View  Result Date: 05/03/2020 CLINICAL DATA:  Nasogastric tube placement EXAM: ABDOMEN - 1 VIEW COMPARISON:  Portable exam 1214 hours compared 05/02/2020 FINDINGS: Tip of nasogastric tube projects over mid stomach. Upper normal heart size with pulmonary vascular congestion. Mediastinal contours normal. RIGHT arm PICC line tip projects over SVC. LEFT lower lobe atelectasis versus consolidation. Moderate RIGHT pleural effusion and basilar atelectasis. Gas in colon. RIGHT ureteral stent visualized. IMPRESSION: Tip of nasogastric tube projects over mid stomach. Persistent  LEFT lower lobe atelectasis versus consolidation. Moderate RIGHT pleural effusion. Electronically Signed   By: Lavonia Dana M.D.   On: 05/03/2020 12:48   IR GASTROSTOMY TUBE MOD SED  Result Date: 05/05/2020 CLINICAL DATA:  Metastatic cervical carcinoma with intractable small-bowel obstruction requiring continuous nasogastric decompression. Bowel obstruction has improved with nasogastric decompression and request has been made to place a venting percutaneous gastrostomy tube so that the patient can be discharged from the hospital without a nasogastric tube. EXAM: PERCUTANEOUS GASTROSTOMY TUBE PLACEMENT ANESTHESIA/SEDATION: Formal moderate conscious sedation was not administered and the patient received 0.5 mg IV Versed for the procedure. CONTRAST:  47m OMNIPAQUE IOHEXOL 300 MG/ML  SOLN MEDICATIONS: No additional medications. FLUOROSCOPY TIME:  6 minutes and 54 seconds.  89.0 mGy. PROCEDURE: The procedure, risks, benefits, and alternatives were explained to the patient. Questions regarding the procedure were encouraged and answered. The patient understands and consents to the procedure. A time-out was performed prior to initiating the procedure. A 5-French catheter was then advanced through the patient's mouth under fluoroscopy into the esophagus and to the level of the stomach. This catheter was used to insufflate the stomach with air under fluoroscopy. The abdominal wall was prepped with Betadine in a sterile fashion, and a sterile drape was applied covering the operative field. A sterile gown and sterile gloves were used for the procedure. Local anesthesia was provided with 1% Lidocaine. A skin incision was made in the upper abdominal wall. Under fluoroscopy, an 18 gauge trocar needle was advanced into the stomach. Contrast injection was performed to confirm intraluminal position of the needle tip. A single T tack was then deployed in the lumen of the stomach. This was brought up to tension at the skin surface.  Over a guidewire, a 9-French sheath was advanced into  the lumen of the stomach. The wire was left in place as a safety wire. A loop snare device from a percutaneous gastrostomy kit was then advanced into the stomach. A floppy guide wire was advanced through the orogastric catheter under fluoroscopy in the stomach. The loop snare advanced through the percutaneous gastric access was used to snare the guide wire. This allowed withdrawal of the loop snare out of the patient's mouth by retraction of the orogastric catheter and wire. A 20-French bumper retention gastrostomy tube was looped around the snare device. It was then pulled back through the patient's mouth. The retention bumper was brought up to the anterior gastric wall. The T tack suture was cut at the skin. The exiting gastrostomy tube was cut to appropriate length and a feeding adapter applied. The catheter was injected with contrast material to confirm position and a fluoroscopic spot image saved. The tube was then flushed with saline. A dressing was applied over the gastrostomy exit site. COMPLICATIONS: None. FINDINGS: The stomach distended well with air allowing safe placement of the gastrostomy tube. After placement, the tip of the gastrostomy tube lies in the body of the stomach. IMPRESSION: Percutaneous gastrostomy with placement of a 20-French bumper retention tube in the body of the stomach. This tube can be immediately connected to wall suction as needed. Electronically Signed   By: Aletta Edouard M.D.   On: 05/05/2020 09:57   US Paracentesis  Result Date: 05/02/2020 INDICATION: Patient with history of stage IV cervical cancer/adenocarcinoma, carcinomatosis, ascites. Request made for diagnostic and therapeutic paracentesis. EXAM: ULTRASOUND GUIDED DIAGNOSTIC AND THERAPEUTIC PARACENTESIS MEDICATIONS: 1% lidocaine to skin and subcutaneous tissue COMPLICATIONS: None immediate. PROCEDURE: Informed written consent was obtained from the patient after  a discussion of the risks, benefits and alternatives to treatment. A timeout was performed prior to the initiation of the procedure. Initial ultrasound scanning demonstrates a large amount of ascites within the right lower abdominal quadrant. The right lower abdomen was prepped and draped in the usual sterile fashion. 1% lidocaine was used for local anesthesia. Following this, a 19 gauge, 10-cm, Yueh catheter was introduced. An ultrasound image was saved for documentation purposes. The paracentesis was performed. The catheter was removed and a dressing was applied. The patient tolerated the procedure well without immediate post procedural complication. FINDINGS: A total of approximately 3.7 liters of slightly hazy, amber fluid was removed. Samples were sent to the laboratory as requested by the clinical team. IMPRESSION: Successful ultrasound-guided diagnostic and therapeutic paracentesis yielding 3.7 liters of peritoneal fluid. Read by: Rowe Robert, PA-C Electronically Signed   By: Jerilynn Mages.  Shick M.D.   On: 05/02/2020 11:15   DG Chest Port 1 View  Result Date: 05/03/2020 CLINICAL DATA:  Status post right thoracentesis. EXAM: PORTABLE CHEST 1 VIEW COMPARISON:  04/30/2020 FINDINGS: Decreased right pleural effusion, no longer apparent on the right. Similar small left pleural effusion. No discernible pneumothorax on this semi erect study. Overlying left basilar opacity. Low lung volumes. Gastric tube courses below the diaphragm with the tip likely in the stomach. Similar cardiac silhouette. Right PICC with the tip projecting at the distal SVC. IMPRESSION: 1. Decreased right pleural effusion, no longer apparent. No discernible pneumothorax. 2. Similar small left pleural effusion. Overlying left basilar opacity, which may represent atelectasis, aspiration, or pneumonia. Electronically Signed   By: Margaretha Sheffield MD   On: 05/03/2020 13:50   DG Abd 2 Views  Result Date: 05/02/2020 CLINICAL DATA:  Weakness, diarrhea  EXAM: ABDOMEN - 2 VIEW COMPARISON:  05/01/2020 FINDINGS: Nasogastric tube likely extends to the second portion of the duodenum with its proximal side hole in the region of the pylorus. This is unchanged from prior examination. Dilated loops of small bowel are again seen within the mid abdomen and gas is seen within multiple nondilated loops of large bowel in keeping with changes of an underlying of partial small bowel obstruction or ileus. No gross free intraperitoneal gas. Moderate right pleural effusion again noted. Right double-J ureteral stent overlies its expected position. Pelvis excluded from view. IMPRESSION: 1. Findings consistent with an underlying partial small bowel obstruction or ileus, similar to prior examination. 2. Nasogastric tube tip in the second portion of the duodenum. Electronically Signed   By: Fidela Salisbury MD   On: 05/02/2020 19:27   US THORACENTESIS ASP PLEURAL SPACE W/IMG GUIDE  Result Date: 05/03/2020 INDICATION: Patient with metastatic gynecologic cancer with carcinomatosis, ascites, and now right pleural effusion. Request made for diagnostic and therapeutic right thoracentesis. EXAM: ULTRASOUND GUIDED DIAGNOSTIC AND THERAPEUTIC RIGHT THORACENTESIS MEDICATIONS: 10 mL 1% lidocaine COMPLICATIONS: None immediate. PROCEDURE: An ultrasound guided thoracentesis was thoroughly discussed with the patient and questions answered. The benefits, risks, alternatives and complications were also discussed. The patient understands and wishes to proceed with the procedure. Written consent was obtained. Ultrasound was performed to localize and mark an adequate pocket of fluid in the right chest. The area was then prepped and draped in the normal sterile fashion. 1% Lidocaine was used for local anesthesia. Under ultrasound guidance a 6 Fr Safe-T-Centesis catheter was introduced. Thoracentesis was performed. The catheter was removed and a dressing applied. FINDINGS: A total of approximately 1.1 liters  of clear, yellow fluid was removed. Samples were sent to the laboratory as requested by the clinical team. IMPRESSION: Successful ultrasound guided diagnostic and therapeutic right thoracentesis yielding 1.1 liters of pleural fluid. Read by: Brynda Greathouse PA-C Electronically Signed   By: Sandi Mariscal M.D.   On: 05/03/2020 14:49    Labs:  CBC: Recent Labs    05/01/20 0408 05/01/20 0408 05/02/20 1500 05/03/20 0405 05/04/20 0950 05/05/20 0331  WBC 5.0  --  6.5  --  7.7 7.3  HGB 7.7*   < > 7.2* 8.8* 7.9* 7.1*  HCT 24.2*   < > 22.2* 26.8* 24.0* 21.7*  PLT 77*  --  79*  --  102* 115*   < > = values in this interval not displayed.    COAGS: Recent Labs    04/16/20 0554 05/04/20 0348  INR 1.1 1.2    BMP: Recent Labs    05/02/20 0337 05/03/20 0405 05/04/20 0348 05/05/20 0331  NA 137 139 142 146*  K 3.9 3.8 3.6 3.7  CL 106 108 106 106  CO2 20* _0 GLUCOSE 129* 135* 125* 132*  BUN 85* 100* 96* 85*  CALCIUM 8.3* 8.1* 8.4* 8.7*  CREATININE 1.89* 1.92* 1.78* 1.41*  GFRNONAA 27* 27* 29* 39*  GFRAA 31* 31* 34* 45*    LIVER FUNCTION TESTS: Recent Labs    05/01/20 0408 05/02/20 0337 05/03/20 0405 05/04/20 0348  BILITOT 0.4 0.5 0.7 0.7  AST 39 _1 ALT _2 ALKPHOS 70 70 65 53  PROT 5.0* 5.0* 4.9* 5.1*  ALBUMIN 2.0* 1.9* 1.7* 2.3*    Assessment and Plan: Pt with hx met cervical cancer with intractable SBO requiring continuous NG decompression; s/p placement of venting G tube on 9/2; afebrile; WBC nl; hgb 7.1(7.9), creat 1.41; G  tube site clean and dry- to suction; hopeful for d/c of NG today.   Electronically Signed: D. Rowe Robert, PA-C 05/05/2020, 10:07 AM   I spent a total of 15 minutes at the the patient's bedside AND on the patient's hospital floor or unit, greater than 50% of which was counseling/coordinating care for gastrostomy tube     Patient ID: Eileen Burgess, female   DOB: 09/24/1955, 64 y.o.   MRN: [id number]

## 2020-05-06 DIAGNOSIS — R531 Weakness: Secondary | ICD-10-CM

## 2020-05-06 LAB — BPAM RBC
Blood Product Expiration Date: 202110012359
ISSUE DATE / TIME: 202109031300
Unit Type and Rh: 5100

## 2020-05-06 LAB — CBC WITH DIFFERENTIAL/PLATELET
Abs Immature Granulocytes: 0.14 10*3/uL — ABNORMAL HIGH (ref 0.00–0.07)
Basophils Absolute: 0 10*3/uL (ref 0.0–0.1)
Basophils Relative: 0 %
Eosinophils Absolute: 0.1 10*3/uL (ref 0.0–0.5)
Eosinophils Relative: 1 %
HCT: 28.8 % — ABNORMAL LOW (ref 36.0–46.0)
Hemoglobin: 9.2 g/dL — ABNORMAL LOW (ref 12.0–15.0)
Immature Granulocytes: 1 %
Lymphocytes Relative: 9 %
Lymphs Abs: 1 10*3/uL (ref 0.7–4.0)
MCH: 27.8 pg (ref 26.0–34.0)
MCHC: 31.9 g/dL (ref 30.0–36.0)
MCV: 87 fL (ref 80.0–100.0)
Monocytes Absolute: 0.5 10*3/uL (ref 0.1–1.0)
Monocytes Relative: 4 %
Neutro Abs: 9.4 10*3/uL — ABNORMAL HIGH (ref 1.7–7.7)
Neutrophils Relative %: 85 %
Platelets: 143 10*3/uL — ABNORMAL LOW (ref 150–400)
RBC: 3.31 MIL/uL — ABNORMAL LOW (ref 3.87–5.11)
RDW: 15.7 % — ABNORMAL HIGH (ref 11.5–15.5)
WBC: 11.1 10*3/uL — ABNORMAL HIGH (ref 4.0–10.5)
nRBC: 0 % (ref 0.0–0.2)

## 2020-05-06 LAB — GLUCOSE, CAPILLARY
Glucose-Capillary: 105 mg/dL — ABNORMAL HIGH (ref 70–99)
Glucose-Capillary: 116 mg/dL — ABNORMAL HIGH (ref 70–99)
Glucose-Capillary: 125 mg/dL — ABNORMAL HIGH (ref 70–99)
Glucose-Capillary: 133 mg/dL — ABNORMAL HIGH (ref 70–99)

## 2020-05-06 LAB — BASIC METABOLIC PANEL
Anion gap: 12 (ref 5–15)
BUN: 62 mg/dL — ABNORMAL HIGH (ref 8–23)
CO2: 29 mmol/L (ref 22–32)
Calcium: 8.8 mg/dL — ABNORMAL LOW (ref 8.9–10.3)
Chloride: 106 mmol/L (ref 98–111)
Creatinine, Ser: 1.34 mg/dL — ABNORMAL HIGH (ref 0.44–1.00)
GFR calc Af Amer: 48 mL/min — ABNORMAL LOW (ref >60)
GFR calc non Af Amer: 41 mL/min — ABNORMAL LOW (ref >60)
Glucose, Bld: 134 mg/dL — ABNORMAL HIGH (ref 70–99)
Potassium: 3.8 mmol/L (ref 3.5–5.1)
Sodium: 147 mmol/L — ABNORMAL HIGH (ref 135–145)

## 2020-05-06 LAB — BODY FLUID CULTURE: Culture: NO GROWTH

## 2020-05-06 LAB — TYPE AND SCREEN
ABO/RH(D): O POS
Antibody Screen: NEGATIVE
Unit division: 0

## 2020-05-06 LAB — PHOSPHORUS: Phosphorus: 3.7 mg/dL (ref 2.5–4.6)

## 2020-05-06 LAB — MAGNESIUM: Magnesium: 2.3 mg/dL (ref 1.7–2.4)

## 2020-05-06 MED ORDER — TRAVASOL 10 % IV SOLN
INTRAVENOUS | Status: AC
  Filled 2020-05-06: qty 900

## 2020-05-06 NOTE — Progress Notes (Signed)
PROGRESS NOTE  Eileen Burgess  IRJ:[id number] DOB: 1956-01-14 DOA: 04/13/2020 PCP: Patient, No Pcp Per   Brief Narrative: Ms. Rising is a 64 y.o.female with PMH of CAD s/p LAD angioplasty, HTN, HLD, remote tobacco use, GERD who presented to [facility] ED 8/12 for 3 weeks of episodic diarrhea, decreased PO intake, dysuria, malaise and fatigue. CT done 8/13 showed a pelvic mass that was concerning for malignancy with evidence of possible metastatic disease. Ureteral stent was placed for R sided obstruction. GYN-ONC consulted 8/16 and felt that based on patient exam that patient likely had advanced cervical cancer. Biopsies were taken of bladder and cervix and both came back as adenocarcinoma. CA 125 also found to be elevated. Medical oncology has been following and patient has received one cycle carboplatin and paclitaxel 8/19. She developed protracted ileus and had NGT placed for this. TPN has been started for malnutrition. Patient also developed significant ascites for which paracentesis has been performed 8/31. Thoracentesis was performed 9/1. IR consulted for palliative venting gastrostomy.   Assessment & Plan: Principal Problem:   Cervical cancer ([place]) Active Problems:   Essential hypertension   UTI (urinary tract infection)   AKI (acute kidney injury) (HCC)   Hyponatremia   Orthostatic hypotension   Hyponatremia syndrome  Metastatic cervical cancer with peritoneal carcinomatosis: New diagnosis.  - Oncology following, s/p cycle 1 of carboplatin and paclitaxel on 04/20/2020. Patient had a meeting with Dr. Alvy Bimler in presence of her son where they have been thoroughly informed about expected course of her metastatic cancer if she were to decline.  SBO, carcinomatous ileus:  -NG tube is still draining significant amount of fluid and she was nauseous this morning.  NG tube discontinued by palliative team today.  Continue antiemetics, analgesics -Status post venting gastrostomy on 9/2 by IR. - TPN  started through PICC, will continue.    Malignant ascites: s/p paracentesis 3.7L on 8/31 - Cytology positive for adenocarcinoma.  Pleural effusion: s/p right thoracentesis 1.1L on 9/1  Severe protein calorie malnutrition: With multifactorial third-spacing of fluids worsened by hypoalbuminemia.  - TPN as above.   Pancytopenia: Due to chemotherapy.  - Per onc, transfuse for hgb <8g/dl or platelets <10k.  CBC showsed hemoglobin 7.1.  On 05/05/2020 and she received 1 unit PRBC.  Posttransfusion hemoglobin 9.2 today.  Her white cells as well as platelets are improving. -Continue to hold heparin with thrombocytopenia and anemia.  AKI on stage IIIa CKD: Due to prerenal azotemia, possibly ATN. Developing after ureteral stent was placed.  GFR improving.  Nephrology signed off. -Continue to hold ACE and HCTZ - Continue intermittent albumin for hemodynamic support with severity of third-spacing.  Renal function is improving very slowly and is very close to her baseline.  CAD: s/p PTCA. No anginal complaints.  - holding ASA with thrombocytopenia and anemia.  Now that her blood pressure has improved and she remains tachycardic, still unable to take p.o. so I will resume her beta-blocker in the IV form with Lopressor 5 mg every 8 hours.  Hyperkalemia: Resolved  GERD - Continue PPI  Goals of care: Goals of Tx are palliative in nature, the patient is aware of this.  - Remains full code.   - Received chemotherapy for malignancy, wishes to continue this if offered.  -Now thoroughly informed about possible course of her metastatic cancer.  Palliative care also on board and very helpful.  Appreciate their help.  Obesity: Estimated body mass index is 39.73 kg/m as calculated from the following:  Height as of this encounter: _0  (1.626 m).   Weight as of this encounter: 105 kg.  DVT prophylaxis: SCDs Code Status: Full Family Communication: None present at bedside. Disposition Plan:  Status is:  Inpatient  Remains inpatient appropriate because:Persistent severe electrolyte disturbances and Inpatient level of care appropriate due to severity of illness  Dispo: The patient is from: Home              Anticipated d/c is to: Home              Anticipated d/c date is: > 3 days              Patient currently is not medically stable to d/c.  Consultants:   Oncology  GynOnc  Nephrology  General surgery  GI  Interventional radiology  Procedures:  04/15/20 Dr. Alinda Money CYSTOSCOPY WITH RETROGRADE PYELOGRAM/URETERAL STENT PLACEMENT    Cervical biopsy  Subjective: Patient seen and examined.  She complained of having some nausea this morning.  She was annoyed by still having NG tube in place.  I explained to her that the reason for continuation of NG tube is that she is still draining significant amount of drainage through NG tube and on top of that, she had some nausea this morning.  She had no other complaint.  Objective: Vitals:   05/05/20 2042 05/05/20 2043 05/05/20 2210 05/06/20 0509  BP:   122/63 112/67  Pulse: 100 (!) 101 93 95  Resp:   14 16  Temp:   97.7 F (36.5 C) 98.2 F (36.8 C)  TempSrc:   Oral Oral  SpO2: 90% 94% 96% 94%  Weight:      Height:        Intake/Output Summary (Last 24 hours) at 05/06/2020 1316 Last data filed at 05/06/2020 0500 Gross per 24 hour  Intake 1415.13 ml  Output 3450 ml  Net -2034.87 ml   Filed Weights   04/13/20 1234 04/24/20 0830 05/01/20 0838  Weight: 98 kg 98.2 kg 105 kg    General exam: Appears calm and comfortable  Respiratory system: Clear to auscultation. Respiratory effort normal. Cardiovascular system: S1 & S2 heard, RRR. No JVD, murmurs, rubs, gallops or clicks. No pedal edema. Gastrointestinal system: Abdomen is nondistended, soft and nontender. No organomegaly or masses felt. Normal bowel sounds heard. Central nervous system: Alert and oriented. No focal neurological deficits. Extremities: Symmetric 5 x 5  power. Skin: No rashes, lesions or ulcers.  Psychiatry: Judgement and insight appear poor. Mood & affect appropriate.     Data Reviewed: I have personally reviewed following labs and imaging studies  CBC: Recent Labs  Lab 05/01/20 0408 05/01/20 0408 05/02/20 1500 05/03/20 0405 05/04/20 0950 05/05/20 0331 05/06/20 0850  WBC 5.0  --  6.5  --  7.7 7.3 11.1*  NEUTROABS 3.0  --  4.5  --  6.0 6.0 9.4*  HGB 7.7*   < > 7.2* 8.8* 7.9* 7.1* 9.2*  HCT 24.2*   < > 22.2* 26.8* 24.0* 21.7* 28.8*  MCV 87.1  --  85.7  --  83.9 85.4 87.0  PLT 77*  --  79*  --  102* 115* 143*   < > = values in this interval not displayed.   Basic Metabolic Panel: Recent Labs  Lab 05/02/20 0337 05/03/20 0405 05/04/20 0348 05/05/20 0331 05/06/20 0345  NA 137 139 142 146* 147*  K 3.9 3.8 3.6 3.7 3.8  CL 106 108 106 106 106  CO2 20* 22 26  29 29  GLUCOSE 129* 135* 125* 132* 134*  BUN 85* 100* 96* 85* 62*  CREATININE 1.89* 1.92* 1.78* 1.41* 1.34*  CALCIUM 8.3* 8.1* 8.4* 8.7* 8.8*  MG 2.2 2.3 2.4 2.4 2.3  PHOS 4.9* 4.6 3.3 2.7 3.7   GFR: Estimated Creatinine Clearance: 48.8 mL/min (A) (by C-G formula based on SCr of 1.34 mg/dL (H)). Liver Function Tests: Recent Labs  Lab 05/01/20 0408 05/02/20 0337 05/03/20 0405 05/04/20 0348  AST 39 _0 ALT _1 ALKPHOS 70 70 65 53  BILITOT 0.4 0.5 0.7 0.7  PROT 5.0* 5.0* 4.9* 5.1*  ALBUMIN 2.0* 1.9* 1.7* 2.3*   Coagulation Profile: Recent Labs  Lab 05/04/20 0348  INR 1.2   CBG: Recent Labs  Lab 05/05/20 0740 05/05/20 1624 05/05/20 2316 05/06/20 0809 05/06/20 1217  GLUCAP 131* 116* 133* 133* 116*   Urine analysis:    Component Value Date/Time   COLORURINE AMBER (A) 05/02/2020 1830   APPEARANCEUR CLOUDY (A) 05/02/2020 1830   LABSPEC 1.015 05/02/2020 1830   PHURINE 5.0 05/02/2020 1830   GLUCOSEU NEGATIVE 05/02/2020 1830   HGBUR LARGE (A) 05/02/2020 1830   [place] 05/02/2020 1830   [place] 05/02/2020  1830   PROTEINUR 30 (A) 05/02/2020 1830   NITRITE NEGATIVE 05/02/2020 1830   LEUKOCYTESUR SMALL (A) 05/02/2020 1830   Recent Results (from the past 240 hour(s))  Body fluid culture     Status: None   Collection Time: 05/03/20  1:30 PM   Specimen: PATH Cytology Pleural fluid  Result Value Ref Range Status   Specimen Description   Final    PLEURAL RIGHT Performed at [facility], [place] [address], [place], [place] [address]    Special Requests   Final    NONE Performed at [facility], [place] [address] [place], [place] [address]    Gram Stain   Final    RARE WBC PRESENT, PREDOMINANTLY MONONUCLEAR NO ORGANISMS SEEN    Culture   Final    NO GROWTH 3 DAYS Performed at [facility], [place] [address], [place], [place] [address]    Report Status 05/06/2020 FINAL  Final      Radiology Studies: IR GASTROSTOMY TUBE MOD SED  Result Date: 05/05/2020 CLINICAL DATA:  Metastatic cervical carcinoma with intractable small-bowel obstruction requiring continuous nasogastric decompression. Bowel obstruction has improved with nasogastric decompression and request has been made to place a venting percutaneous gastrostomy tube so that the patient can be discharged from the hospital without a nasogastric tube. EXAM: PERCUTANEOUS GASTROSTOMY TUBE PLACEMENT ANESTHESIA/SEDATION: Formal moderate conscious sedation was not administered and the patient received 0.5 mg IV Versed for the procedure. CONTRAST:  49m OMNIPAQUE IOHEXOL 300 MG/ML  SOLN MEDICATIONS: No additional medications. FLUOROSCOPY TIME:  6 minutes and 54 seconds.  89.0 mGy. PROCEDURE: The procedure, risks, benefits, and alternatives were explained to the patient. Questions regarding the procedure were encouraged and answered. The patient understands and consents to the procedure. A time-out was performed prior to initiating the procedure. A 5-French catheter was then advanced through the patient's mouth under  fluoroscopy into the esophagus and to the level of the stomach. This catheter was used to insufflate the stomach with air under fluoroscopy. The abdominal wall was prepped with Betadine in a sterile fashion, and a sterile drape was applied covering the operative field. A sterile gown and sterile gloves were used for the procedure. Local anesthesia was provided with 1% Lidocaine.  A skin incision was made in the upper abdominal wall. Under fluoroscopy, an 18 gauge trocar needle was advanced into the stomach. Contrast injection was performed to confirm intraluminal position of the needle tip. A single T tack was then deployed in the lumen of the stomach. This was brought up to tension at the skin surface. Over a guidewire, a 9-French sheath was advanced into the lumen of the stomach. The wire was left in place as a safety wire. A loop snare device from a percutaneous gastrostomy kit was then advanced into the stomach. A floppy guide wire was advanced through the orogastric catheter under fluoroscopy in the stomach. The loop snare advanced through the percutaneous gastric access was used to snare the guide wire. This allowed withdrawal of the loop snare out of the patient's mouth by retraction of the orogastric catheter and wire. A 20-French bumper retention gastrostomy tube was looped around the snare device. It was then pulled back through the patient's mouth. The retention bumper was brought up to the anterior gastric wall. The T tack suture was cut at the skin. The exiting gastrostomy tube was cut to appropriate length and a feeding adapter applied. The catheter was injected with contrast material to confirm position and a fluoroscopic spot image saved. The tube was then flushed with saline. A dressing was applied over the gastrostomy exit site. COMPLICATIONS: None. FINDINGS: The stomach distended well with air allowing safe placement of the gastrostomy tube. After placement, the tip of the gastrostomy tube lies in  the body of the stomach. IMPRESSION: Percutaneous gastrostomy with placement of a 20-French bumper retention tube in the body of the stomach. This tube can be immediately connected to wall suction as needed. Electronically Signed   By: Aletta Edouard M.D.   On: 05/05/2020 09:57    Scheduled Meds: . Chlorhexidine Gluconate Cloth  6 each Topical Daily  . insulin aspart  0-9 Units Subcutaneous Q8H  . metoprolol tartrate  5 mg Intravenous Q8H  . pantoprazole (PROTONIX) IV  40 mg Intravenous Q24H  . sodium chloride flush  10-40 mL Intracatheter Q12H   Continuous Infusions: . TPN ADULT (ION) 75 mL/hr at 05/05/20 1905  . TPN ADULT (ION)       LOS: 22 days   Time spent: 30 minutes.  Darliss Cheney, MD [facility] [email or web address] 05/06/2020, 1:16 PM

## 2020-05-06 NOTE — Consult Note (Signed)
Consultation Note Date: 05/06/2020   Patient Name: Eileen Burgess  DOB: 1956/04/12  MRN: [id number]  Age / Sex: 64 y.o., female  PCP: Patient, No Pcp Per Referring Physician: Darliss Cheney, MD  Reason for Consultation: Establishing goals of care  HPI/Patient Profile: 64 y.o. female  with past medical history of metastatic gynecologic cancer with carcinomatosis and ureteral obstruction status post stent placement admitted on 04/13/2020 with small bowel obstruction, carcinomatosis ileus, pleural effusion and malignant ascites, severe protein calorie malnutrition patient placed on TPN, acute kidney injury, underlying stage III CKD.  Clinical Assessment and Goals of Care: A palliative consultation has been requested as an extra layer of support, to continue goals of care discussions that are ongoing, have been initiated by medical oncology, symptom management and assistance with appropriate disposition options.  Eileen Burgess is awake alert resting in bed.  I introduced myself and palliative care as follows: Palliative medicine is specialized medical care for people living with serious illness. It focuses on providing relief from the symptoms and stress of a serious illness. The goal is to improve quality of life for both the patient and the family.  Goals of care: Broad aims of medical therapy in relation to the patient's values and preferences. Our aim is to provide medical care aimed at enabling patients to achieve the goals that matter most to them, given the circumstances of their particular medical situation and their constraints.   Patient has several flowers in her room that have been given to her by her son and her siblings.  She lives at home with her son and daughter-in-law in [place], [place].  We recapped some of her cancer history.  She states that she was just diagnosed with cancer.  She has  received 1 cycle of chemotherapy.  Patient is aware of the serious and incurable nature of her condition.  We spent some time talking about "palliative intent" treatment measures and the difference between palliative and curative intent.  Patient states that they have had discussions with Dr. Simeon Craft such.  Patient's son has also had discussions that both of them really appreciate.  Patient is aware of her ongoing decline in performance status and the advanced nature of her malignancy.  Goals wishes and values important to the patient attempted to be explored.  Overall, this morning, patient's primary concern is her NG tube.  She states that last she understood, the NG tube was to be discontinued.  Chart reviewed.  Patient has a G-tube that has been placed for venting/palliative purposes for subacute bowel obstruction secondary to carcinomatosis.  Patient had some nausea earlier today but declines any nausea currently.  Denies pain currently.  This morning, patient's immediate concern is to get her NG tube discontinued.  I will go ahead and write the order for her NG tube to be discontinued.  This was an initial palliative visit attempted to discuss and reflect on patient's concerns and to continue efforts at trust building.  Suspect that the patient will likely need addition of  hospice as an extra layer of support in the near future.  Palliative team will follow along for supportive care and goals of care discussions.  NEXT OF KIN Lives at home with son and daughter-in-law.  Has siblings.  SUMMARY OF RECOMMENDATIONS   Full code for now. DC NG tube. We will request chaplain consult for additional support Continue current mode of care Palliative team will follow along with medical oncology as an extra layer of support and continue to work towards goal concordant care. Thank you for the consult. Medication history reviewed.  Continue current pain and nonpain symptom management.  We will continue to  monitor.  Code Status/Advance Care Planning:  Full code    Symptom Management:   As above  Palliative Prophylaxis:   Delirium Protocol  Additional Recommendations (Limitations, Scope, Preferences):  D/C NGT  Psycho-social/Spiritual:   Desire for further Chaplaincy support:yes  Additional Recommendations: Caregiving  Support/Resources  Prognosis:   Unable to determine  Discharge Planning: To Be Determined      Primary Diagnoses: Present on Admission: . UTI (urinary tract infection) . Essential hypertension . Hyponatremia syndrome . Cervical cancer ([place])   I have reviewed the medical record, interviewed the patient and family, and examined the patient. The following aspects are pertinent.  Past Medical History:  Diagnosis Date  . CAD S/P percutaneous coronary angioplasty 2005   PCI to pLAD 99%; 3.0 mm x 38 mm Cypher DES; EF ~45% - distal, septal apical hypokinesis    . Dyslipidemia, goal LDL below 70   . Essential hypertension   . GERD (gastroesophageal reflux disease)   . 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 Insomnia    Social History   Socioeconomic History  . Marital status: Widowed    Spouse name: Not on file  . Number of children: Not on file  . Years of education: Not on file  . Highest education level: Not on file  Occupational History  . Occupation: retired  Tobacco Use  . Smoking status: Former Smoker    Quit date: 07/03/2004    Years since quitting: 15.8  . Smokeless tobacco: Never Used  Substance and Sexual Activity  . Alcohol use: No  . Drug use: No  . Sexual activity: Not Currently  Other Topics Concern  . Not on file  Social History Narrative   Widowed.   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   Has been quite active, but no routine exercise.   Quit Smoking in 2005 before her MI.  Does not drink alcohol.   Social Determinants of Health   Financial  Resource Strain:   . Difficulty of Paying Living Expenses: Not on file  Food Insecurity:   . Worried About Charity fundraiser in the Last Year: Not on file  . Ran Out of Food in the Last Year: Not on file  Transportation Needs:   . Lack of Transportation (Medical): Not on file  . Lack of Transportation (Non-Medical): Not on file  Physical Activity:   . Days of Exercise per Week: Not on file  . Minutes of Exercise per Session: Not on file  Stress:   . Feeling of Stress : Not on file  Social Connections:   . Frequency of Communication with Friends and Family: Not on file  . Frequency of Social Gatherings with Friends and Family: Not on file  . Attends Religious Services: Not on file  .  Active Member of Clubs or Organizations: Not on file  . Attends Archivist Meetings: Not on file  . Marital Status: Not on file   Family History  Problem Relation Age of Onset  . Stroke Mother   . Hypertension Mother   . Hyperlipidemia Father   . Cancer Maternal Grandmother        unsure type, thinks may be gyn  . Hyperlipidemia Son        not known, pt assumes because of poor diet  . Breast cancer Neg Hx   . Colon cancer Neg Hx    Scheduled Meds: . Chlorhexidine Gluconate Cloth  6 each Topical Daily  . insulin aspart  0-9 Units Subcutaneous Q8H  . metoprolol tartrate  5 mg Intravenous Q8H  . pantoprazole (PROTONIX) IV  40 mg Intravenous Q24H  . sodium chloride flush  10-40 mL Intracatheter Q12H   Continuous Infusions: . TPN ADULT (ION) 75 mL/hr at 05/05/20 1905  . TPN ADULT (ION)     PRN Meds:.acetaminophen **OR** acetaminophen, alteplase, alum & mag hydroxide-simeth, guaiFENesin-dextromethorphan, heparin lock flush, heparin lock flush, ipratropium-albuterol, nitroGLYCERIN, ondansetron **OR** ondansetron (ZOFRAN) IV, phenol, prochlorperazine, simethicone, sodium chloride flush, sodium chloride flush, sodium chloride flush Medications Prior to Admission:  Prior to Admission  medications   Medication Sig Start Date End Date Taking? Authorizing Provider  acetaminophen (TYLENOL) 500 MG tablet Take 500-1,000 mg by mouth every 6 (six) hours as needed for mild pain or moderate pain.   Yes [provider]  hydrochlorothiazide (MICROZIDE) 12.5 MG capsule TAKE 1 CAPSULE BY MOUTH DAILY Patient taking differently: Take 12.5 mg by mouth daily.  03/07/20  Yes [name], MD  Ibuprofen-Acetaminophen (ADVIL DUAL ACTION) 125-250 MG TABS Take 2 tablets by mouth every 8 (eight) hours as needed (pain).   Yes [provider]  lisinopril (ZESTRIL) 20 MG tablet TAKE 1 TABLET BY MOUTH DAILY Patient taking differently: Take 20 mg by mouth daily.  03/07/20  Yes [name], MD  metoprolol succinate (TOPROL-XL) 25 MG 24 hr tablet Take 1 tablet (25 mg total) by mouth daily. 09/08/19  Yes [name], MD  nitroGLYCERIN (NITROSTAT) 0.4 MG SL tablet TAKE 1 TABLET UNDER THE TONGUE AS NEEDEDFOR CHEST PAIN UP TO 3 TIMES AN EPISODE Patient taking differently: Place 0.4 mg under the tongue every 5 (five) minutes x 3 doses as needed for chest pain.  06/16/18  Yes [name], MD  Alirocumab (PRALUENT) 150 MG/ML SOAJ Inject 150 mg into the skin every 14 (fourteen) days. Patient not taking: Reported on 04/13/2020 06/02/19   [name], MD   Allergies  Allergen Reactions  . Shrimp [Shellfish Allergy] Anaphylaxis  . Crestor [Rosuvastatin] Other (See Comments)    LEGS HURTING  . Pravastatin Other (See Comments)    myalagia  . Vytorin [Ezetimibe-Simvastatin] Other (See Comments)    myalgia  . Wasp Venom Hives   Review of Systems Denies pain and nausea currently Physical Exam Awake alert resting in bed Has NG tube, has G-tube hooked to suction, draining well Denies nausea vomiting currently Abdomen is mildly distended Has regular work of breathing Has mild generalized abdominal tenderness Awake alert nonfocal  Vital Signs: BP 112/67 (BP Location: Left Arm)    Pulse 95   Temp 98.2 F (36.8 C) (Oral)   Resp 16   Ht 5\' 4"  (1.626 m)   Wt 105 kg   LMP  (LMP Unknown)   SpO2 94%   BMI 39.73  kg/m  Pain Scale: 0-10 POSS *See Group Information*: 1-Acceptable,Awake and alert Pain Score: 0-No pain   SpO2: SpO2: 94 % O2 Device:SpO2: 94 % O2 Flow Rate: .O2 Flow Rate (L/min): 2 L/min  IO: Intake/output summary:   Intake/Output Summary (Last 24 hours) at 05/06/2020 1059 Last data filed at 05/06/2020 0500 Gross per 24 hour  Intake 1665.13 ml  Output 3450 ml  Net -1784.87 ml    LBM: Last BM Date: 05/05/20 (small amount of mucousy stool in flexiseal tubing) Baseline Weight: Weight: 98 kg Most recent weight: Weight: 105 kg     Palliative Assessment/Data:   PPS 30%  Time In:  9 Time Out:  10 Time Total:  60 Greater than 50%  of this time was spent counseling and coordinating care related to the above assessment and plan.  Signed by: Loistine Chance, MD   Please contact Palliative Medicine Team phone at [phone] for questions and concerns.  For individual provider: See Shea Evans

## 2020-05-06 NOTE — Progress Notes (Signed)
PHARMACY - TOTAL PARENTERAL NUTRITION CONSULT NOTE   Indication: Prolonged ileus  Patient Measurements: Height: 5\' 4"  (162.6 cm) Weight: 105 kg (231 lb 7.7 oz) IBW/kg (Calculated) : 54.7 TPN AdjBW (KG): 65.5 Body mass index is 39.73 kg/m. Usual Weight: 98 kg  Assessment: 64 y/o F found to have metastatic gynecologic cancer with carcinomatosis and ureteral obstruction s/p stent placement with minimal oral intake for over a week, possible ileus. Pharmacy consulted to manage TPN.   Glucose / Insulin: No history of DM. - cbgs <150  - 2 units of insulin from SSI in 24 hours.  Electrolytes: Na elevated at 147; K 3.8, Phos up from 2.7 to 3.7;  All other lytes WNL including CorrCa 10 (trending up). Renal: SCr trending down 1.34 (crcl~46), BUN down 62 -  Nephrology following AKI. LFTs / TGs: LFTs WNL; TG 141 (8/30) Prealbumin / albumin: 8.8 (8/30)/ 2.3 (9/2) Intake / Output; MIVF: I/O -946; drain 850 - MIVF: none, NS stopped by MD on 8/29 GI Imaging:  -8/21 Abd x-ray: ileus vs obstruction -8/26 Abd x-ray: possible ileus, unable to exclude distal colonic obstruction -8/28 Abd x-ray: Ureteral stent is stable.  Continued air-filled prominent colon may represent ileus -8/30 Abd x-ray: Mildly dilated small bowel loops in right abdomen appear slightly increased. Stable moderate gaseous distention of the colon. Findings are compatible with persistent adynamic ileus.  -8/30 CT abd: Moderate volume intra-abdominal ascites. Suspected ileus, incompletely evaluated. Moderate sized bilateral pleural effusions with associated bibasilar atelectasis, right greater than left.  -8/31 Abd x-ray: Findings consistent with an underlying partial small bowel obstruction or ileus, similar to prior examination.  Surgeries / Procedures:  -8/14 ureteral stent placement  -8/31 paracentesis  -9/1 thoracentesis -9/2: G-tube placement Significant Events: -8/26: N/V, NGT placed. CLD > NPO  Central access: PICC  8/18 TPN start date: 8/23  Nutritional Goals (per RD recommendation on 9/2):  kCal: 1700-1900, Protein: 80-90g, Fluid: 1.8 L/d  Goal TPN rate is 75 mL/hr (provides 90 g of protein and 1821 kcals per day); meeting >100% of patient needs  Note:  Intralipid product ordered in TPN from 8/23-8/28.  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 Pharmacist was informed on 8/28 that SMOFlipids had been used to prepare the TPN from 8/23-8/27 despite the Intralipid orders and that Intralipid was not available for the TPN on 8/28. The order was changed from Intralipid to SMOFlipids. Will continue with SMOFlipids at this time, as it appears she has been tolerating for several days.   Current Nutrition:  TPN, NPO   Plan:   At 1800:  Continue TPN at goal rate of 75 mL/hr  Electrolytes in TPN:   Na 4mEq/L, K 35 mEq/L, reduce Ca to 2.5 mEq/L, Mag 5 mEq/L, reduce Phos to 5 mmol/L  Cl:Ac ratio: 1:1  Add standard MVI and trace elements to TPN  CBG check + sensitive SSI q8h  MIVF per MD (none at this time)   Monitor TPN labs on Monday/Thursday  BMET, phos, mag on 9/5  Lynelle Doctor, PharmD 05/06/20 8:46 AM

## 2020-05-07 LAB — BASIC METABOLIC PANEL
Anion gap: 10 (ref 5–15)
BUN: 64 mg/dL — ABNORMAL HIGH (ref 8–23)
CO2: 28 mmol/L (ref 22–32)
Calcium: 8.5 mg/dL — ABNORMAL LOW (ref 8.9–10.3)
Chloride: 110 mmol/L (ref 98–111)
Creatinine, Ser: 1.42 mg/dL — ABNORMAL HIGH (ref 0.44–1.00)
GFR calc Af Amer: 44 mL/min — ABNORMAL LOW (ref >60)
GFR calc non Af Amer: 38 mL/min — ABNORMAL LOW (ref >60)
Glucose, Bld: 124 mg/dL — ABNORMAL HIGH (ref 70–99)
Potassium: 3.6 mmol/L (ref 3.5–5.1)
Sodium: 148 mmol/L — ABNORMAL HIGH (ref 135–145)

## 2020-05-07 LAB — CBC WITH DIFFERENTIAL/PLATELET
Abs Immature Granulocytes: 0.1 10*3/uL — ABNORMAL HIGH (ref 0.00–0.07)
Basophils Absolute: 0 10*3/uL (ref 0.0–0.1)
Basophils Relative: 0 %
Eosinophils Absolute: 0.2 10*3/uL (ref 0.0–0.5)
Eosinophils Relative: 2 %
HCT: 27.8 % — ABNORMAL LOW (ref 36.0–46.0)
Hemoglobin: 8.8 g/dL — ABNORMAL LOW (ref 12.0–15.0)
Immature Granulocytes: 1 %
Lymphocytes Relative: 13 %
Lymphs Abs: 1.1 10*3/uL (ref 0.7–4.0)
MCH: 27.9 pg (ref 26.0–34.0)
MCHC: 31.7 g/dL (ref 30.0–36.0)
MCV: 88.3 fL (ref 80.0–100.0)
Monocytes Absolute: 0.3 10*3/uL (ref 0.1–1.0)
Monocytes Relative: 4 %
Neutro Abs: 6.6 10*3/uL (ref 1.7–7.7)
Neutrophils Relative %: 80 %
Platelets: 139 10*3/uL — ABNORMAL LOW (ref 150–400)
RBC: 3.15 MIL/uL — ABNORMAL LOW (ref 3.87–5.11)
RDW: 15.9 % — ABNORMAL HIGH (ref 11.5–15.5)
WBC: 8.3 10*3/uL (ref 4.0–10.5)
nRBC: 0 % (ref 0.0–0.2)

## 2020-05-07 LAB — PHOSPHORUS: Phosphorus: 4.3 mg/dL (ref 2.5–4.6)

## 2020-05-07 LAB — MAGNESIUM: Magnesium: 2.4 mg/dL (ref 1.7–2.4)

## 2020-05-07 LAB — GLUCOSE, CAPILLARY
Glucose-Capillary: 125 mg/dL — ABNORMAL HIGH (ref 70–99)
Glucose-Capillary: 129 mg/dL — ABNORMAL HIGH (ref 70–99)

## 2020-05-07 MED ORDER — TRAVASOL 10 % IV SOLN
INTRAVENOUS | Status: AC
  Filled 2020-05-07: qty 900

## 2020-05-07 NOTE — Progress Notes (Signed)
Daily Progress Note   Patient Name: Eileen Burgess       Date: 05/07/2020 DOB: [birth year]  Age: 64 y.o. MRN#: [id number] Attending Physician: Darliss Cheney, MD Primary Care Physician: Patient, No Pcp Per Admit Date: 04/13/2020  Reason for Consultation/Follow-up: Establishing goals of care  Subjective:  awake alert resting in bed, "I don't know how I feel."   Length of Stay: 23  Current Medications: Scheduled Meds:   Chlorhexidine Gluconate Cloth  6 each Topical Daily   insulin aspart  0-9 Units Subcutaneous Q8H   metoprolol tartrate  5 mg Intravenous Q8H   pantoprazole (PROTONIX) IV  40 mg Intravenous Q24H   sodium chloride flush  10-40 mL Intracatheter Q12H    Continuous Infusions:  TPN ADULT (ION) 75 mL/hr at 05/06/20 1707   TPN ADULT (ION)      PRN Meds: acetaminophen **OR** acetaminophen, alteplase, alum & mag hydroxide-simeth, guaiFENesin-dextromethorphan, heparin lock flush, heparin lock flush, ipratropium-albuterol, nitroGLYCERIN, ondansetron **OR** ondansetron (ZOFRAN) IV, phenol, prochlorperazine, simethicone, sodium chloride flush, sodium chloride flush, sodium chloride flush  Physical Exam         Awake alert Anxious today Regular work of breathing Has PEG Abdomen more distended today Has edema  Vital Signs: BP 116/64 (BP Location: Right Arm)    Pulse 92    Temp 98.5 F (36.9 C) (Oral)    Resp 18    Ht 5\' 4"  (1.626 m)    Wt 105 kg    LMP  (LMP Unknown)    SpO2 (!) 88%    BMI 39.73 kg/m  SpO2: SpO2: (!) 88 % O2 Device: O2 Device: Room Air O2 Flow Rate: O2 Flow Rate (L/min): 2 L/min  Intake/output summary:   Intake/Output Summary (Last 24 hours) at 05/07/2020 1327 Last data filed at 05/06/2020 1916 Gross per 24 hour  Intake --  Output 950 ml  Net -950  ml   LBM: Last BM Date: 05/05/20 Baseline Weight: Weight: 98 kg Most recent weight: Weight: 105 kg       Palliative Assessment/Data:      Patient Active Problem List   Diagnosis Date Noted   Goals of care, counseling/discussion 04/18/2020   Cervical cancer ([place]) 04/17/2020   Hyponatremia syndrome 04/14/2020   UTI (urinary tract infection) 04/13/2020   AKI (acute kidney injury) ([place]) 04/13/2020  Hyponatremia 04/13/2020   Orthostatic hypotension 04/13/2020   Statin intolerance 02/19/2014   Anxiety - very rare "panick attacks" 02/19/2014   Essential hypertension 02/19/2014   Obesity (BMI 30-39.9) 04/03/2013   CAD S/P percutaneous coronary angioplasty    Dyslipidemia, goal LDL below 70    History of acute anterior wall MI 08/14/2004    Palliative Care Assessment & Plan   Patient Profile:  64 y.o. female  with past medical history of metastatic gynecologic cancer with carcinomatosis and ureteral obstruction status post stent placement admitted on 04/13/2020 with small bowel obstruction, carcinomatosis ileus, pleural effusion and malignant ascites, severe protein calorie malnutrition patient placed on TPN, acute kidney injury, underlying stage III CKD.  Assessment: Metastatic cervical cancer with peritoneal carcinomatosis. SBO carcinomatous ileus. Malignant ascites s/p paracentesis. Functional decline. Pleural effusion s/p thoracentesis. Severe protein calorie malnutrition.   PPS 30%.  Recommendations/Plan: Goals of care discussions with the patient today: We discussed about her current malignancy and broad goals of care. Patient wants to be home towards the end of this hospitalization, she states that the only 2 things she is craving for are ice cream and sprite. She states that her son is "taking all this very hard" and "we've been down this road before". When asked to elaborate, she states that her husband died of cancer in 2000/12/13. We then talked about full  code versus DNR DNI. I offered her my medical recommendation to consider DNR DNI, she will discuss further with her son, we also talked about what home with hospice care looks like. Patient becomes tearful and anxious. Offered active listening and supportive care, empathic presence and compassionate conversation.    Goals of Care and Additional Recommendations:  Limitations on Scope of Treatment: Full Scope Treatment  Code Status:    Code Status Orders  (From admission, onward)         Start     Ordered   04/13/20 1747  Full code  Continuous        04/13/20 1746        Code Status History    This patient has a current code status but no historical code status.   Advance Care Planning Activity      Prognosis:   Unable to determine  Discharge Planning:  To Be Determined  Care plan was discussed with  Patient today.   Thank you for allowing the Palliative Medicine Team to assist in the care of this patient.   Time In: 1300 Time Out: 12/13/33 Total Time 35  Prolonged Time Billed No.       Greater than 50%  of this time was spent counseling and coordinating care related to the above assessment and plan.  Loistine Chance, MD  Please contact Palliative Medicine Team phone at [phone] for questions and concerns.

## 2020-05-07 NOTE — Progress Notes (Signed)
RT called to administer PRN tx. Patient tachypneic- 24, but no markedly increased WOB. [place] in place on patient, but not connected to flowmeter. Patient O2 sats 87-88%. BBS diminished. Treatment administered and O2 turned to 2 L. Bedside RN aware.

## 2020-05-07 NOTE — Progress Notes (Signed)
PHARMACY - TOTAL PARENTERAL NUTRITION CONSULT NOTE   Indication: Prolonged ileus  Patient Measurements: Height: 5\' 4"  (162.6 cm) Weight: 105 kg (231 lb 7.7 oz) IBW/kg (Calculated) : 54.7 TPN AdjBW (KG): 65.5 Body mass index is 39.73 kg/m. Usual Weight: 98 kg  Assessment: 64 y/o F found to have metastatic gynecologic cancer with carcinomatosis and ureteral obstruction s/p stent placement with minimal oral intake for over a week, possible ileus. Pharmacy consulted to manage TPN.   Glucose / Insulin: No history of DM. - cbgs  (goal <150): wnl - 2 units of insulin from SSI in 24 hours.  Electrolytes: Na elevated at 148; K 3.6, Phos trending up with 4.3;  All other lytes WNL including CorrCa Renal: SCr up 1.42 (crcl~46), BUN 62 -  Nephrology following AKI. LFTs / TGs: LFTs WNL; TG 141 (8/30) Prealbumin / albumin: 8.8 (8/30)/ 2.3 (9/2) Intake / Output; MIVF: I/O +163; drain 700 - MIVF: none, NS stopped by MD on 8/29 GI Imaging:  -8/21 Abd x-ray: ileus vs obstruction -8/26 Abd x-ray: possible ileus, unable to exclude distal colonic obstruction -8/28 Abd x-ray: Ureteral stent is stable.  Continued air-filled prominent colon may represent ileus -8/30 Abd x-ray: Mildly dilated small bowel loops in right abdomen appear slightly increased. Stable moderate gaseous distention of the colon. Findings are compatible with persistent adynamic ileus.  -8/30 CT abd: Moderate volume intra-abdominal ascites. Suspected ileus, incompletely evaluated. Moderate sized bilateral pleural effusions with associated bibasilar atelectasis, right greater than left.  -8/31 Abd x-ray: Findings consistent with an underlying partial small bowel obstruction or ileus, similar to prior examination.  Surgeries / Procedures:  -8/14 ureteral stent placement  -8/31 paracentesis  -9/1 thoracentesis -9/2: G-tube placement Significant Events: -8/26: N/V, NGT placed. CLD > NPO  Central access: PICC 8/18 TPN start date:  8/23  Nutritional Goals (per RD recommendation on 9/2):  kCal: 1700-1900, Protein: 80-90g, Fluid: 1.8 L/d  Goal TPN rate is 75 mL/hr (provides 90 g of protein and 1821 kcals per day); meeting >100% of patient needs  Note:  Intralipid product ordered in TPN from 8/23-8/28.  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 Pharmacist was informed on 8/28 that SMOFlipids had been used to prepare the TPN from 8/23-8/27 despite the Intralipid orders and that Intralipid was not available for the TPN on 8/28. The order was changed from Intralipid to SMOFlipids. Will continue with SMOFlipids at this time, as it appears she has been tolerating for several days.   Current Nutrition:  TPN, NPO   Plan:   At 1800:  Continue TPN at goal rate of 75 mL/hr  Electrolytes in TPN:   Remove Na in bag, K 50 mEq/L, Ca to 2.5 mEq/L, Mag 5 mEq/L, remove Phos in bag  Cl:Ac ratio: 1:1  Add standard MVI and trace elements to TPN  CBG check + sensitive SSI q8h  MIVF per MD (none at this time)   Monitor TPN labs on Monday/Thursday  Lynelle Doctor, PharmD 05/07/20 8:20 AM

## 2020-05-07 NOTE — Progress Notes (Signed)
PROGRESS NOTE  Eileen Burgess  KNL:[id number] DOB: 02-21-1956 DOA: 04/13/2020 PCP: Patient, No Pcp Per   Brief Narrative: Ms. Elsayed is a 64 y.o.female with PMH of CAD s/p LAD angioplasty, HTN, HLD, remote tobacco use, GERD who presented to [facility] ED 8/12 for 3 weeks of episodic diarrhea, decreased PO intake, dysuria, malaise and fatigue. CT done 8/13 showed a pelvic mass that was concerning for malignancy with evidence of possible metastatic disease. Ureteral stent was placed for R sided obstruction. GYN-ONC consulted 8/16 and felt that based on patient exam that patient likely had advanced cervical cancer. Biopsies were taken of bladder and cervix and both came back as adenocarcinoma. CA 125 also found to be elevated. Medical oncology has been following and patient has received one cycle carboplatin and paclitaxel 8/19. She developed protracted ileus and had NGT placed for this. TPN has been started for malnutrition. Patient also developed significant ascites for which paracentesis has been performed 8/31. Thoracentesis was performed 9/1. IR consulted for palliative venting gastrostomy which was performed on 05/04/2020.  NG tube was taken out on 05/06/2020.  Assessment & Plan: Principal Problem:   Cervical cancer ([place]) Active Problems:   Essential hypertension   UTI (urinary tract infection)   AKI (acute kidney injury) ([facility])   Hyponatremia   Orthostatic hypotension   Hyponatremia syndrome  Metastatic cervical cancer with peritoneal carcinomatosis: New diagnosis.  - Oncology following, s/p cycle 1 of carboplatin and paclitaxel on 04/20/2020. Patient had a meeting with Dr. Alvy Bimler in presence of her son where they have been thoroughly informed about expected course of her metastatic cancer if she were to decline.  SBO, carcinomatous ileus:  -NG tube discontinued by palliative team on 05/06/2020.  Patient feels better.  No nausea or vomiting.  Continue as needed antiemetics, analgesics -Status post  venting gastrostomy on 9/2 by IR. - TPN started through PICC, will continue.    Malignant ascites: s/p paracentesis 3.7L on 8/31 - Cytology positive for adenocarcinoma.  Pleural effusion: s/p right thoracentesis 1.1L on 9/1  Severe protein calorie malnutrition: With multifactorial third-spacing of fluids worsened by hypoalbuminemia.  - TPN as above.   Pancytopenia: Due to chemotherapy.  - Per onc, transfuse for hgb <8g/dl or platelets <10k.  CBC showsed hemoglobin 7.1.  On 05/05/2020 and she received 1 unit PRBC.  Posttransfusion hemoglobin 9.2.  Her white cells as well as platelets are improving. -Continue to hold heparin with thrombocytopenia and anemia.  AKI on stage IIIa CKD: Due to prerenal azotemia, possibly ATN. Developing after ureteral stent was placed.  GFR improving.  Nephrology signed off. -Continue to hold ACE and HCTZ - Continue intermittent albumin for hemodynamic support with severity of third-spacing.  Renal function is improving very slowly and is very close to her baseline.  CAD: s/p PTCA. No anginal complaints.  - holding ASA with thrombocytopenia and anemia.  Now that her blood pressure has improved and she remains tachycardic, still unable to take p.o. so I will resume her beta-blocker in the IV form with Lopressor 5 mg every 8 hours.  Hyperkalemia: Resolved  GERD - Continue PPI  Goals of care: Goals of Tx are palliative in nature, the patient is aware of this.  - Remains full code.   - Received chemotherapy for malignancy, wishes to continue this if offered.  -Now thoroughly informed about possible course of her metastatic cancer.  Palliative care also on board and very helpful.  Patient to decide on CODE STATUS.  Appreciate their help.  Obesity: Estimated body mass index is 39.73 kg/m as calculated from the following:   Height as of this encounter: 5\' 4"  (1.626 m).   Weight as of this encounter: 105 kg.  DVT prophylaxis: SCDs Code Status: Full Family  Communication: None present at bedside. Disposition Plan:  Status is: Inpatient  Remains inpatient appropriate because:Persistent severe electrolyte disturbances and Inpatient level of care appropriate due to severity of illness  Dispo: The patient is from: Home              Anticipated d/c is to: Home              Anticipated d/c date is: > 3 days              Patient currently is not medically stable to d/c.  Consultants:   Oncology  GynOnc  Nephrology  General surgery  GI  Interventional radiology  Procedures:  04/15/20 Dr. Alinda Money CYSTOSCOPY WITH RETROGRADE PYELOGRAM/URETERAL STENT PLACEMENT    Cervical biopsy  Subjective: Seen and examined.  No complaints today.  Still not passing gas.  Objective: Vitals:   05/06/20 2108 05/07/20 0455 05/07/20 1236 05/07/20 1336  BP: 111/64 116/64  115/68  Pulse: 96 92  (!) 107  Resp:  18  17  Temp:  98.5 F (36.9 C)  98.2 F (36.8 C)  TempSrc:  Oral    SpO2:  90% (!) 88% 97%  Weight:      Height:        Intake/Output Summary (Last 24 hours) at 05/07/2020 1555 Last data filed at 05/06/2020 1916 Gross per 24 hour  Intake --  Output 950 ml  Net -950 ml   Filed Weights   04/13/20 1234 04/24/20 0830 05/01/20 0838  Weight: 98 kg 98.2 kg 105 kg    General exam: Appears calm and comfortable  Respiratory system: Clear to auscultation. Respiratory effort normal. Cardiovascular system: S1 & S2 heard, RRR. No JVD, murmurs, rubs, gallops or clicks. No pedal edema. Gastrointestinal system: Abdomen is nondistended, soft and nontender. No organomegaly or masses felt.  Diminished bowel sounds Central nervous system: Alert and oriented. No focal neurological deficits. Extremities: Symmetric 5 x 5 power. Skin: No rashes, lesions or ulcers.  Psychiatry: Judgement and insight appear poor. Mood & affect appropriate.   Data Reviewed: I have personally reviewed following labs and imaging studies  CBC: Recent Labs  Lab 05/02/20 1500  05/02/20 1500 05/03/20 0405 05/04/20 0950 05/05/20 0331 05/06/20 0850 05/07/20 0335  WBC 6.5  --   --  7.7 7.3 11.1* 8.3  NEUTROABS 4.5  --   --  6.0 6.0 9.4* 6.6  HGB 7.2*   < > 8.8* 7.9* 7.1* 9.2* 8.8*  HCT 22.2*   < > 26.8* 24.0* 21.7* 28.8* 27.8*  MCV 85.7  --   --  83.9 85.4 87.0 88.3  PLT 79*  --   --  102* 115* 143* 139*   < > = values in this interval not displayed.   Basic Metabolic Panel: Recent Labs  Lab 05/03/20 0405 05/04/20 0348 05/05/20 0331 05/06/20 0345 05/07/20 0335  NA 139 142 146* 147* 148*  K 3.8 3.6 3.7 3.8 3.6  CL 108 106 106 106 110  CO2 22 26 29 29 28   GLUCOSE 135* 125* 132* 134* 124*  BUN 100* 96* 85* 62* 64*  CREATININE 1.92* 1.78* 1.41* 1.34* 1.42*  CALCIUM 8.1* 8.4* 8.7* 8.8* 8.5*  MG 2.3 2.4 2.4 2.3 2.4  PHOS 4.6 3.3 2.7  3.7 4.3   GFR: Estimated Creatinine Clearance: 46 mL/min (A) (by C-G formula based on SCr of 1.42 mg/dL (H)). Liver Function Tests: Recent Labs  Lab 05/01/20 0408 05/02/20 0337 05/03/20 0405 05/04/20 0348  AST 39 31 25 21   ALT 25 28 25 17   ALKPHOS 70 70 65 53  BILITOT 0.4 0.5 0.7 0.7  PROT 5.0* 5.0* 4.9* 5.1*  ALBUMIN 2.0* 1.9* 1.7* 2.3*   Coagulation Profile: Recent Labs  Lab 05/04/20 0348  INR 1.2   CBG: Recent Labs  Lab 05/06/20 0809 05/06/20 1217 05/06/20 1639 05/06/20 2354 05/07/20 0730  GLUCAP 133* 116* 105* 125* 125*   Urine analysis:    Component Value Date/Time   COLORURINE AMBER (A) 05/02/2020 1830   APPEARANCEUR CLOUDY (A) 05/02/2020 1830   LABSPEC 1.015 05/02/2020 1830   PHURINE 5.0 05/02/2020 1830   GLUCOSEU NEGATIVE 05/02/2020 1830   HGBUR LARGE (A) 05/02/2020 1830   [place] 05/02/2020 1830   [place] 05/02/2020 1830   PROTEINUR 30 (A) 05/02/2020 1830   NITRITE NEGATIVE 05/02/2020 1830   LEUKOCYTESUR SMALL (A) 05/02/2020 1830   Recent Results (from the past 240 hour(s))  Body fluid culture     Status: None   Collection Time: 05/03/20  1:30 PM    Specimen: PATH Cytology Pleural fluid  Result Value Ref Range Status   Specimen Description   Final    PLEURAL RIGHT Performed at [facility], [place] [address] [place], [place] [address]    Special Requests   Final    NONE Performed at [facility], [place] [address] [place], [place] [address]    Gram Stain   Final    RARE WBC PRESENT, PREDOMINANTLY MONONUCLEAR NO ORGANISMS SEEN    Culture   Final    NO GROWTH 3 DAYS Performed at [facility], [place] [address] [place], [place] [address]    Report Status 05/06/2020 FINAL  Final      Radiology Studies: No results found.  Scheduled Meds: . Chlorhexidine Gluconate Cloth  6 each Topical Daily  . insulin aspart  0-9 Units Subcutaneous Q8H  . metoprolol tartrate  5 mg Intravenous Q8H  . pantoprazole (PROTONIX) IV  40 mg Intravenous Q24H  . sodium chloride flush  10-40 mL Intracatheter Q12H   Continuous Infusions: . TPN ADULT (ION) 75 mL/hr at 05/06/20 1707  . TPN ADULT (ION)       LOS: 23 days   Time spent: 28 minutes.  Darliss Cheney, MD [facility] [email or web address] 05/07/2020, 3:55 PM

## 2020-05-08 LAB — COMPREHENSIVE METABOLIC PANEL
ALT: 27 U/L (ref 0–44)
AST: 29 U/L (ref 15–41)
Albumin: 2.1 g/dL — ABNORMAL LOW (ref 3.5–5.0)
Alkaline Phosphatase: 80 U/L (ref 38–126)
Anion gap: 10 (ref 5–15)
BUN: 64 mg/dL — ABNORMAL HIGH (ref 8–23)
CO2: 25 mmol/L (ref 22–32)
Calcium: 8.1 mg/dL — ABNORMAL LOW (ref 8.9–10.3)
Chloride: 111 mmol/L (ref 98–111)
Creatinine, Ser: 1.31 mg/dL — ABNORMAL HIGH (ref 0.44–1.00)
GFR calc Af Amer: 49 mL/min — ABNORMAL LOW (ref >60)
GFR calc non Af Amer: 42 mL/min — ABNORMAL LOW (ref >60)
Glucose, Bld: 129 mg/dL — ABNORMAL HIGH (ref 70–99)
Potassium: 4.3 mmol/L (ref 3.5–5.1)
Sodium: 146 mmol/L — ABNORMAL HIGH (ref 135–145)
Total Bilirubin: 0.6 mg/dL (ref 0.3–1.2)
Total Protein: 5.6 g/dL — ABNORMAL LOW (ref 6.5–8.1)

## 2020-05-08 LAB — CBC WITH DIFFERENTIAL/PLATELET
Abs Immature Granulocytes: 0.08 10*3/uL — ABNORMAL HIGH (ref 0.00–0.07)
Basophils Absolute: 0 10*3/uL (ref 0.0–0.1)
Basophils Relative: 0 %
Eosinophils Absolute: 0.2 10*3/uL (ref 0.0–0.5)
Eosinophils Relative: 3 %
HCT: 29.4 % — ABNORMAL LOW (ref 36.0–46.0)
Hemoglobin: 9.1 g/dL — ABNORMAL LOW (ref 12.0–15.0)
Immature Granulocytes: 1 %
Lymphocytes Relative: 12 %
Lymphs Abs: 1 10*3/uL (ref 0.7–4.0)
MCH: 27.9 pg (ref 26.0–34.0)
MCHC: 31 g/dL (ref 30.0–36.0)
MCV: 90.2 fL (ref 80.0–100.0)
Monocytes Absolute: 0.4 10*3/uL (ref 0.1–1.0)
Monocytes Relative: 5 %
Neutro Abs: 6.1 10*3/uL (ref 1.7–7.7)
Neutrophils Relative %: 79 %
Platelets: 170 10*3/uL (ref 150–400)
RBC: 3.26 MIL/uL — ABNORMAL LOW (ref 3.87–5.11)
RDW: 16.2 % — ABNORMAL HIGH (ref 11.5–15.5)
WBC: 7.8 10*3/uL (ref 4.0–10.5)
nRBC: 0 % (ref 0.0–0.2)

## 2020-05-08 LAB — GLUCOSE, CAPILLARY
Glucose-Capillary: 117 mg/dL — ABNORMAL HIGH (ref 70–99)
Glucose-Capillary: 130 mg/dL — ABNORMAL HIGH (ref 70–99)
Glucose-Capillary: 135 mg/dL — ABNORMAL HIGH (ref 70–99)
Glucose-Capillary: 99 mg/dL (ref 70–99)

## 2020-05-08 LAB — PHOSPHORUS: Phosphorus: 3.4 mg/dL (ref 2.5–4.6)

## 2020-05-08 LAB — PREALBUMIN: Prealbumin: 9.4 mg/dL — ABNORMAL LOW (ref 18–38)

## 2020-05-08 LAB — MAGNESIUM: Magnesium: 2.4 mg/dL (ref 1.7–2.4)

## 2020-05-08 LAB — TRIGLYCERIDES: Triglycerides: 142 mg/dL (ref <150)

## 2020-05-08 MED ORDER — LORAZEPAM 2 MG/ML IJ SOLN
1.0000 mg | Freq: Four times a day (QID) | INTRAMUSCULAR | Status: DC | PRN
  Administered 2020-05-08 – 2020-05-11 (×3): 1 mg via INTRAVENOUS
  Filled 2020-05-08 (×3): qty 1

## 2020-05-08 MED ORDER — TRAVASOL 10 % IV SOLN
INTRAVENOUS | Status: AC
  Filled 2020-05-08: qty 900

## 2020-05-08 NOTE — Progress Notes (Signed)
PHARMACY - TOTAL PARENTERAL NUTRITION CONSULT NOTE   Indication: Prolonged ileus  Patient Measurements: Height: 5\' 4"  (162.6 cm) Weight: 105 kg (231 lb 7.7 oz) IBW/kg (Calculated) : 54.7 TPN AdjBW (KG): 65.5 Body mass index is 39.73 kg/m. Usual Weight: 98 kg  Assessment: 64 y/o F found to have metastatic gynecologic cancer with carcinomatosis and ureteral obstruction s/p stent placement with minimal oral intake for over a week, possible ileus. Pharmacy consulted to manage TPN.   Glucose / Insulin: No history of DM. - cbgs  (goal <150): wnl - 4 units of insulin from SSI in 24 hours.  Electrolytes: Na elevated at 146; K incr to 4.3, Phos improved 3.4;  All other lytes WNL including CorrCa Renal: SCr stable 1.31 (crcl~50), BUN 64 -  Nephrology following AKI. LFTs / TGs: LFTs WNL; TG 141 (8/30), 142 (9/6) Prealbumin / albumin: 8.8 (8/30)/ 2.3 (9/2); 9.4 (9/6)/ 2.1 (9/6) Intake / Output; MIVF: I/O -450 ml; drain output not charted - MIVF: none, NS stopped by MD on 8/29 GI Imaging:  -8/21 Abd x-ray: ileus vs obstruction -8/26 Abd x-ray: possible ileus, unable to exclude distal colonic obstruction -8/28 Abd x-ray: Ureteral stent is stable.  Continued air-filled prominent colon may represent ileus -8/30 Abd x-ray: Mildly dilated small bowel loops in right abdomen appear slightly increased. Stable moderate gaseous distention of the colon. Findings are compatible with persistent adynamic ileus.  -8/30 CT abd: Moderate volume intra-abdominal ascites. Suspected ileus, incompletely evaluated. Moderate sized bilateral pleural effusions with associated bibasilar atelectasis, right greater than left.  -8/31 Abd x-ray: Findings consistent with an underlying partial small bowel obstruction or ileus, similar to prior examination.  Surgeries / Procedures:  -8/14 ureteral stent placement  -8/31 paracentesis  -9/1 thoracentesis -9/2: G-tube placement Significant Events: -8/26: N/V, NGT placed. CLD >  NPO  Central access: PICC 8/18 TPN start date: 8/23  Nutritional Goals (per RD recommendation on 9/2):  kCal: 1700-1900, Protein: 80-90g, Fluid: 1.8 L/d  Goal TPN rate is 75 mL/hr (provides 90 g of protein and 1821 kcals per day); meeting >100% of patient needs  Note:  Intralipid product ordered in TPN from 8/23-8/28.  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 Pharmacist was informed on 8/28 that SMOFlipids had been used to prepare the TPN from 8/23-8/27 despite the Intralipid orders and that Intralipid was not available for the TPN on 8/28. The order was changed from Intralipid to SMOFlipids. Will continue with SMOFlipids at this time, as it appears she has been tolerating for several days.   Current Nutrition:  TPN, NPO  Plan:   At 1800:  Continue TPN at goal rate of 75 mL/hr  Electrolytes in TPN:   Remove Na in bag, reduce K to 40 mEq/L, Ca to 2.5 mEq/L, Mag 5 mEq/L, add Phos back at 5 mMol/L  Cl:Ac ratio: 1:1  Add standard MVI and trace elements to TPN  CBG check + sensitive SSI q8h  MIVF per MD (none at this time)   Monitor TPN labs on Monday/Thursday  BMET, Phos level in am  Minda Ditto, PharmD 05/08/20 7:33 AM

## 2020-05-08 NOTE — Progress Notes (Deleted)
   05/08/20 1500  Clinical Encounter Type  Visited With Patient and family together  Visit Type Initial;Psychological support;Spiritual support  Referral From Nurse  Consult/Referral To Chaplain  Spiritual Encounters  Spiritual Needs Emotional;Other (Comment);Brochure Programmer, systems )  Stress Factors  Patient Stress Factors Health changes;Major life changes  Family Stress Factors Health changes;Major life changes  Advance Directives (For Healthcare)  Does Patient Have a Medical Advance Directive? No  Would patient like information on creating a medical advance directive? Yes (Inpatient - patient requests chaplain consult to create a medical advance directive) (Patient Given paperwork and education )  [place]  Does Patient Have a Mental Health Advance Directive? No  Would patient like information on creating a mental health advance directive? No - Patient declined   I visited with Eileen Burgess per spiritual care consult to discuss an Advance Directive. I provided the paperwork and education for an Advance Directive. Eileen Burgess stated that she wants her fiance to be her healthcare agent in the event that she is unable to make her own healthcare decisions. She wants to go over the document and knows to have the nurse page Spiritual Care when she is ready to complete.   Please, contact Spiritual Care for further assistance.   Chaplain Shanon Ace M.Div., [facility]

## 2020-05-08 NOTE — Progress Notes (Signed)
°   05/08/20 1500  Clinical Encounter Type  Visited With Patient  Visit Type Initial;Psychological support;Spiritual support  Referral From Nurse  Consult/Referral To Chaplain  Spiritual Encounters  Spiritual Needs Emotional;Other (Comment) (Spiritual Care Conversation/Support)  Stress Factors  Patient Stress Factors Health changes;Major life changes  Advance Directives (For Healthcare)  Does Patient Have a Medical Advance Directive? No  Would patient like information on creating a medical advance directive? No - Patient declined (Patient Given paperwork and education )  Mental Health Advance Directives  Does Patient Have a Mental Health Advance Directive? No  Would patient like information on creating a mental health advance directive? No - Patient declined   I visited with Olin Hauser. She did not state any needs at this time.   Please, contact Spiritual Care for further assistance.   Chaplain Shanon Ace M.Div., [facility]

## 2020-05-08 NOTE — Progress Notes (Signed)
PT Cancellation Note  Patient Details Name: Eileen Burgess MRN: [id number] DOB: [birth year]   Cancelled Treatment:    Reason Eval/Treat Not Completed:  Attempted PT tx session. Pt declined participation with PT. Spoke with pt a little more about her wishes regarding PT-she does not want to work with PT in house. She stated she is planning to d/c with hospice. Will sign off at pt's request.    Doreatha Massed, PT Acute Rehabilitation  Office: [phone] Pager: [phone]       ,

## 2020-05-08 NOTE — Progress Notes (Signed)
Daily Progress Note   Patient Name: Eileen Burgess       Date: 05/08/2020 DOB: 1956-06-24  Age: 64 y.o. MRN#: [id number] Attending Physician: Darliss Cheney, MD Primary Care Physician: Patient, No Pcp Per Admit Date: 04/13/2020  Reason for Consultation/Follow-up: Establishing goals of care  Subjective:  awake alert resting in bed  Patient states she is thinking seriously about hospice. We talked about code status, artificial nutrition with TPN that is ongoing, and described in detail about what home with hospice is all about.   Length of Stay: 24  Current Medications: Scheduled Meds:  . Chlorhexidine Gluconate Cloth  6 each Topical Daily  . insulin aspart  0-9 Units Subcutaneous Q8H  . metoprolol tartrate  5 mg Intravenous Q8H  . pantoprazole (PROTONIX) IV  40 mg Intravenous Q24H  . sodium chloride flush  10-40 mL Intracatheter Q12H    Continuous Infusions: . TPN ADULT (ION) 75 mL/hr at 05/07/20 1712  . TPN ADULT (ION)      PRN Meds: acetaminophen **OR** acetaminophen, alteplase, alum & mag hydroxide-simeth, guaiFENesin-dextromethorphan, heparin lock flush, heparin lock flush, ipratropium-albuterol, nitroGLYCERIN, ondansetron **OR** ondansetron (ZOFRAN) IV, phenol, prochlorperazine, simethicone, sodium chloride flush, sodium chloride flush, sodium chloride flush  Physical Exam         Awake alert Anxious today Regular work of breathing Has PEG Abdomen   distended   Has edema  Vital Signs: BP 103/63 (BP Location: Left Arm)   Pulse 92   Temp (!) 97.5 F (36.4 C) (Oral)   Resp 18   Ht 5\' 4"  (1.626 m)   Wt 105 kg   LMP  (LMP Unknown)   SpO2 99%   BMI 39.73 kg/m  SpO2: SpO2: 99 % O2 Device: O2 Device: Nasal Cannula O2 Flow Rate: O2 Flow Rate (L/min): 2  L/min  Intake/output summary:   Intake/Output Summary (Last 24 hours) at 05/08/2020 1118 Last data filed at 05/08/2020 0511 Gross per 24 hour  Intake 925.57 ml  Output 2250 ml  Net -1324.43 ml   LBM: Last BM Date: 05/05/20 Baseline Weight: Weight: 98 kg Most recent weight: Weight: 105 kg       Palliative Assessment/Data:      Patient Active Problem List   Diagnosis Date Noted  . Goals of care, counseling/discussion 04/18/2020  . Cervical cancer ([place])  04/17/2020  . Hyponatremia syndrome 04/14/2020  . UTI (urinary tract infection) 04/13/2020  . AKI (acute kidney injury) ([place]) 04/13/2020  . Hyponatremia 04/13/2020  . Orthostatic hypotension 04/13/2020  . Statin intolerance 02/19/2014  . Anxiety - very rare "panick attacks" 02/19/2014  . Essential hypertension 02/19/2014  . Obesity (BMI 30-39.9) 04/03/2013  . CAD S/P percutaneous coronary angioplasty   . Dyslipidemia, goal LDL below 70   . History of acute anterior wall MI 08/14/2004    Palliative Care Assessment & Plan   Patient Profile:  64 y.o. female  with past medical history of metastatic gynecologic cancer with carcinomatosis and ureteral obstruction status post stent placement admitted on 04/13/2020 with small bowel obstruction, carcinomatosis ileus, pleural effusion and malignant ascites, severe protein calorie malnutrition patient placed on TPN, acute kidney injury, underlying stage III CKD.  Assessment: Metastatic cervical cancer with peritoneal carcinomatosis. SBO carcinomatous ileus. Malignant ascites s/p paracentesis. Functional decline. Pleural effusion s/p thoracentesis. Severe protein calorie malnutrition.   PPS 30%.  Recommendations/Plan: Goals of care discussions with the patient today: 1. Patient states she does not want full code anymore, at end of life she no longer wants artificial resuscitative efforts, does not want CPR, does not want intubation, she is accepting of a natural death and makes her  wishes known for DNR DNI.  2. She states that her son is "taking this very hard" and that she wants to handle all of her own decisions on her own for as long as she can.  3. We talked about comfort feeds versus artificial nutrition such as TPN, she wants to discuss with her son, think about d/c TPN some more, continue TPN for now.  3. We discussed again about home with hospice, she will continue to think, PMT will follow.    Goals of Care and Additional Recommendations:  Limitations on Scope of Treatment: Full Scope Treatment  Code Status:    Code Status Orders  (From admission, onward)         Start     Ordered   04/13/20 1747  Full code  Continuous        04/13/20 1746        Code Status History    This patient has a current code status but no historical code status.   Advance Care Planning Activity    Now DNR  Prognosis:   guarded, ? 3-4 weeks.  Discharge Planning:  To Be Determined Ongoing discussions about home with hospice.   Care plan was discussed with  Patient today.  Also discussed with [facility] colleague   Thank you for allowing the Palliative Medicine Team to assist in the care of this patient.   Time In: 10 Time Out: 10.35 Total Time 35  Prolonged Time Billed No.       Greater than 50%  of this time was spent counseling and coordinating care related to the above assessment and plan.  Loistine Chance, MD  Please contact Palliative Medicine Team phone at [phone] for questions and concerns.

## 2020-05-08 NOTE — Progress Notes (Signed)
PROGRESS NOTE  Eileen Burgess  MOQ:[id number] DOB: Jun 06, 1956 DOA: 04/13/2020 PCP: Patient, No Pcp Per   Brief Narrative: Eileen Burgess is a 64 y.o.female with PMH of CAD s/p LAD angioplasty, HTN, HLD, remote tobacco use, GERD who presented to [facility] ED 8/12 for 3 weeks of episodic diarrhea, decreased PO intake, dysuria, malaise and fatigue. CT done 8/13 showed a pelvic mass that was concerning for malignancy with evidence of possible metastatic disease. Ureteral stent was placed for R sided obstruction. GYN-ONC consulted 8/16 and felt that based on patient exam that patient likely had advanced cervical cancer. Biopsies were taken of bladder and cervix and both came back as adenocarcinoma. CA 125 also found to be elevated. Medical oncology has been following and patient has received one cycle carboplatin and paclitaxel 8/19. She developed protracted ileus and had NGT placed for this. TPN has been started for malnutrition. Patient also developed significant ascites for which paracentesis has been performed 8/31. Thoracentesis was performed 9/1. IR consulted for palliative venting gastrostomy which was performed on 05/04/2020.  NG tube was taken out on 05/06/2020.  Assessment & Plan: Principal Problem:   Cervical cancer ([place]) Active Problems:   Essential hypertension   UTI (urinary tract infection)   AKI (acute kidney injury) ([facility])   Hyponatremia   Orthostatic hypotension   Hyponatremia syndrome  Metastatic cervical cancer with peritoneal carcinomatosis: New diagnosis.  - Oncology following, s/p cycle 1 of carboplatin and paclitaxel on 04/20/2020. Patient had a meeting with Dr. Alvy Bimler in presence of her son where they have been thoroughly informed about expected course of her metastatic cancer if she were to decline.  SBO, carcinomatous ileus:  -NG tube discontinued by palliative team on 05/06/2020.  Patient feels better.  No nausea or vomiting but at the same time, she is not passing gas and has not had  any bowel movement.  Continue as needed antiemetics, analgesics -Status post venting gastrostomy on 9/2 by IR. - TPN started through PICC, will continue.    Malignant ascites: s/p paracentesis 3.7L on 8/31 - Cytology positive for adenocarcinoma.  Pleural effusion: s/p right thoracentesis 1.1L on 9/1  Severe protein calorie malnutrition: With multifactorial third-spacing of fluids worsened by hypoalbuminemia.  - TPN as above.   Pancytopenia: Due to chemotherapy.  - Per onc, transfuse for hgb <8g/dl or platelets <10k.  CBC showsed hemoglobin 7.1.  On 05/05/2020 and she received 1 unit PRBC.  Posttransfusion hemoglobin 9.2.  Her white cells as well as platelets are improving. -Continue to hold heparin with thrombocytopenia and anemia.  AKI on stage IIIa CKD: Due to prerenal azotemia, possibly ATN. Developing after ureteral stent was placed.  GFR improving.  Nephrology signed off. -Continue to hold ACE and HCTZ Renal function is improving very slowly and is very close to her baseline.  CAD: s/p PTCA. No anginal complaints.  - holding ASA with thrombocytopenia and anemia.  Now that her blood pressure has improved and she remains tachycardic, still unable to take p.o. so I will resume her beta-blocker in the IV form with Lopressor 5 mg every 8 hours.  Hyperkalemia: Resolved  GERD - Continue PPI  Goals of care: Goals of Tx are palliative in nature, the patient is aware of this.  - Received chemotherapy for malignancy.  -Now thoroughly informed about possible course of her metastatic cancer.  Palliative care also on board and very helpful.  Patient has now accepted the reality of poor outcome of her cancer and has elected to be DNR.  CODE STATUS changed.  She has also decided to go home with hospice.  I have informed palliative care.  Obesity: Estimated body mass index is 39.73 kg/m as calculated from the following:   Height as of this encounter: 5\' 4"  (1.626 m).   Weight as of this  encounter: 105 kg.  DVT prophylaxis: SCDs Code Status: Full Family Communication: None present at bedside. Disposition Plan:  Status is: Inpatient  Remains inpatient appropriate because:Persistent severe electrolyte disturbances and Inpatient level of care appropriate due to severity of illness  Dispo: The patient is from: Home              Anticipated d/c is to: Home              Anticipated d/c date is: 1 to 2 days              Patient currently is not medically stable to d/c.  Consultants:   Oncology  GynOnc  Nephrology  General surgery  GI  Interventional radiology  Procedures:  04/15/20 Dr. Alinda Money CYSTOSCOPY WITH RETROGRADE PYELOGRAM/URETERAL STENT PLACEMENT    Cervical biopsy  Subjective: Patient seen and examined.  She has no complaints.  Still not passing gas.  No BM.  Objective: Vitals:   05/07/20 1236 05/07/20 1336 05/07/20 1949 05/08/20 0443  BP:  115/68 112/75 103/63  Pulse:  (!) 107 100 92  Resp:  17 18 18   Temp:  98.2 F (36.8 C) 98.2 F (36.8 C) (!) 97.5 F (36.4 C)  TempSrc:   Oral Oral  SpO2: (!) 88% 97% 98% 99%  Weight:      Height:        Intake/Output Summary (Last 24 hours) at 05/08/2020 1150 Last data filed at 05/08/2020 0511 Gross per 24 hour  Intake 925.57 ml  Output 2250 ml  Net -1324.43 ml   Filed Weights   04/13/20 1234 04/24/20 0830 05/01/20 0838  Weight: 98 kg 98.2 kg 105 kg    General exam: Appears calm and comfortable  Respiratory system: Clear to auscultation. Respiratory effort normal. Cardiovascular system: S1 & S2 heard, RRR. No JVD, murmurs, rubs, gallops or clicks. No pedal edema. Gastrointestinal system: Abdomen is slightly distended, soft and nontender. No organomegaly or masses felt.  Absent bowel sounds Central nervous system: Alert and oriented. No focal neurological deficits. Extremities: Symmetric 5 x 5 power. Skin: No rashes, lesions or ulcers.  Psychiatry: Judgement and insight appear normal. Mood &  affect appropriate.   Data Reviewed: I have personally reviewed following labs and imaging studies  CBC: Recent Labs  Lab 05/04/20 0950 05/05/20 0331 05/06/20 0850 05/07/20 0335 05/08/20 0415  WBC 7.7 7.3 11.1* 8.3 7.8  NEUTROABS 6.0 6.0 9.4* 6.6 6.1  HGB 7.9* 7.1* 9.2* 8.8* 9.1*  HCT 24.0* 21.7* 28.8* 27.8* 29.4*  MCV 83.9 85.4 87.0 88.3 90.2  PLT 102* 115* 143* 139* 115   Basic Metabolic Panel: Recent Labs  Lab 05/04/20 0348 05/05/20 0331 05/06/20 0345 05/07/20 0335 05/08/20 0415  NA 142 146* 147* 148* 146*  K 3.6 3.7 3.8 3.6 4.3  CL 106 106 106 110 111  CO2 26 29 29 28 25   GLUCOSE 125* 132* 134* 124* 129*  BUN 96* 85* 62* 64* 64*  CREATININE 1.78* 1.41* 1.34* 1.42* 1.31*  CALCIUM 8.4* 8.7* 8.8* 8.5* 8.1*  MG 2.4 2.4 2.3 2.4 2.4  PHOS 3.3 2.7 3.7 4.3 3.4   GFR: Estimated Creatinine Clearance: 49.9 mL/min (A) (by C-G formula based on SCr  of 1.31 mg/dL (H)). Liver Function Tests: Recent Labs  Lab 05/02/20 0337 05/03/20 0405 05/04/20 0348 05/08/20 0415  AST 31 25 21 29   ALT 28 25 17 27   ALKPHOS 70 65 53 80  BILITOT 0.5 0.7 0.7 0.6  PROT 5.0* 4.9* 5.1* 5.6*  ALBUMIN 1.9* 1.7* 2.3* 2.1*   Coagulation Profile: Recent Labs  Lab 05/04/20 0348  INR 1.2   CBG: Recent Labs  Lab 05/06/20 2354 05/07/20 0730 05/07/20 1630 05/08/20 0016 05/08/20 0721  GLUCAP 125* 125* 129* 130* 135*   Urine analysis:    Component Value Date/Time   COLORURINE AMBER (A) 05/02/2020 1830   APPEARANCEUR CLOUDY (A) 05/02/2020 1830   LABSPEC 1.015 05/02/2020 1830   PHURINE 5.0 05/02/2020 1830   GLUCOSEU NEGATIVE 05/02/2020 1830   HGBUR LARGE (A) 05/02/2020 1830   [place] 05/02/2020 1830   [place] 05/02/2020 1830   PROTEINUR 30 (A) 05/02/2020 1830   NITRITE NEGATIVE 05/02/2020 1830   LEUKOCYTESUR SMALL (A) 05/02/2020 1830   Recent Results (from the past 240 hour(s))  Body fluid culture     Status: None   Collection Time: 05/03/20  1:30 PM    Specimen: PATH Cytology Pleural fluid  Result Value Ref Range Status   Specimen Description   Final    PLEURAL RIGHT Performed at [facility], [place] [address] [place], [place] [address]    Special Requests   Final    NONE Performed at [facility], [place] [address] [place], [place] [address]    Gram Stain   Final    RARE WBC PRESENT, PREDOMINANTLY MONONUCLEAR NO ORGANISMS SEEN    Culture   Final    NO GROWTH 3 DAYS Performed at [facility], [place] [address], [place], [place] [address]    Report Status 05/06/2020 FINAL  Final      Radiology Studies: No results found.  Scheduled Meds: . Chlorhexidine Gluconate Cloth  6 each Topical Daily  . insulin aspart  0-9 Units Subcutaneous Q8H  . metoprolol tartrate  5 mg Intravenous Q8H  . pantoprazole (PROTONIX) IV  40 mg Intravenous Q24H  . sodium chloride flush  10-40 mL Intracatheter Q12H   Continuous Infusions: . TPN ADULT (ION) 75 mL/hr at 05/07/20 1712  . TPN ADULT (ION)       LOS: 24 days   Time spent: 27 minutes.  Darliss Cheney, MD [facility] [email or web address] 05/08/2020, 11:50 AM

## 2020-05-09 DIAGNOSIS — Z515 Encounter for palliative care: Secondary | ICD-10-CM

## 2020-05-09 LAB — GLUCOSE, CAPILLARY
Glucose-Capillary: 108 mg/dL — ABNORMAL HIGH (ref 70–99)
Glucose-Capillary: 116 mg/dL — ABNORMAL HIGH (ref 70–99)
Glucose-Capillary: 131 mg/dL — ABNORMAL HIGH (ref 70–99)

## 2020-05-09 LAB — BASIC METABOLIC PANEL
Anion gap: 9 (ref 5–15)
BUN: 74 mg/dL — ABNORMAL HIGH (ref 8–23)
CO2: 23 mmol/L (ref 22–32)
Calcium: 8.3 mg/dL — ABNORMAL LOW (ref 8.9–10.3)
Chloride: 111 mmol/L (ref 98–111)
Creatinine, Ser: 1.4 mg/dL — ABNORMAL HIGH (ref 0.44–1.00)
GFR calc Af Amer: 45 mL/min — ABNORMAL LOW (ref >60)
GFR calc non Af Amer: 39 mL/min — ABNORMAL LOW (ref >60)
Glucose, Bld: 134 mg/dL — ABNORMAL HIGH (ref 70–99)
Potassium: 4.7 mmol/L (ref 3.5–5.1)
Sodium: 143 mmol/L (ref 135–145)

## 2020-05-09 LAB — CBC WITH DIFFERENTIAL/PLATELET
Abs Immature Granulocytes: 0.08 10*3/uL — ABNORMAL HIGH (ref 0.00–0.07)
Basophils Absolute: 0 10*3/uL (ref 0.0–0.1)
Basophils Relative: 0 %
Eosinophils Absolute: 0.2 10*3/uL (ref 0.0–0.5)
Eosinophils Relative: 2 %
HCT: 31.1 % — ABNORMAL LOW (ref 36.0–46.0)
Hemoglobin: 9.6 g/dL — ABNORMAL LOW (ref 12.0–15.0)
Immature Granulocytes: 1 %
Lymphocytes Relative: 14 %
Lymphs Abs: 1.2 10*3/uL (ref 0.7–4.0)
MCH: 27.6 pg (ref 26.0–34.0)
MCHC: 30.9 g/dL (ref 30.0–36.0)
MCV: 89.4 fL (ref 80.0–100.0)
Monocytes Absolute: 0.6 10*3/uL (ref 0.1–1.0)
Monocytes Relative: 7 %
Neutro Abs: 6.4 10*3/uL (ref 1.7–7.7)
Neutrophils Relative %: 76 %
Platelets: 184 10*3/uL (ref 150–400)
RBC: 3.48 MIL/uL — ABNORMAL LOW (ref 3.87–5.11)
RDW: 16.1 % — ABNORMAL HIGH (ref 11.5–15.5)
WBC: 8.5 10*3/uL (ref 4.0–10.5)
nRBC: 0 % (ref 0.0–0.2)

## 2020-05-09 LAB — PHOSPHORUS: Phosphorus: 3.2 mg/dL (ref 2.5–4.6)

## 2020-05-09 MED ORDER — TRAVASOL 10 % IV SOLN
INTRAVENOUS | Status: AC
  Filled 2020-05-09: qty 900

## 2020-05-09 MED ORDER — ALTEPLASE 2 MG IJ SOLR
2.0000 mg | Freq: Once | INTRAMUSCULAR | Status: AC
  Administered 2020-05-09: 2 mg
  Filled 2020-05-09: qty 2

## 2020-05-09 MED ORDER — ALTEPLASE 2 MG IJ SOLR
2.0000 mg | Freq: Once | INTRAMUSCULAR | Status: DC
  Filled 2020-05-09: qty 2

## 2020-05-09 NOTE — Progress Notes (Signed)
PROGRESS NOTE  ARBADELLA KIMBLER  ZOX:[id number] DOB: 1956/08/01 DOA: 04/13/2020 PCP: Patient, No Pcp Per   Brief Narrative: Ms. Posa is a 64 y.o.female with PMH of CAD s/p LAD angioplasty, HTN, HLD, remote tobacco use, GERD who presented to [facility] ED 8/12 for 3 weeks of episodic diarrhea, decreased PO intake, dysuria, malaise and fatigue. CT done 8/13 showed a pelvic mass that was concerning for malignancy with evidence of possible metastatic disease. Ureteral stent was placed for R sided obstruction. GYN-ONC consulted 8/16 and felt that based on patient exam that patient likely had advanced cervical cancer. Biopsies were taken of bladder and cervix and both came back as adenocarcinoma. CA 125 also found to be elevated. Medical oncology has been following and patient has received one cycle carboplatin and paclitaxel 8/19. She developed protracted ileus and had NGT placed for this. TPN has been started for malnutrition. Patient also developed significant ascites for which paracentesis has been performed 8/31. Thoracentesis was performed 9/1. IR consulted for palliative venting gastrostomy which was performed on 05/04/2020.  NG tube was taken out on 05/06/2020.  Assessment & Plan: Principal Problem:   Cervical cancer ([place]) Active Problems:   Essential hypertension   UTI (urinary tract infection)   AKI (acute kidney injury) (HCC)   Hyponatremia   Orthostatic hypotension   Hyponatremia syndrome  Metastatic cervical cancer with peritoneal carcinomatosis: New diagnosis.  - Oncology following, s/p cycle 1 of carboplatin and paclitaxel on 04/20/2020. Patient had a meeting with Dr. Alvy Bimler in presence of her son where they have been thoroughly informed about expected course of her metastatic cancer if she were to decline.  SBO, carcinomatous ileus:  -NG tube discontinued by palliative team on 05/06/2020.  Patient once again has a started having some nausea last night which persisted through this morning.   Still not passing gas or any bowel movement.  Continue as needed antiemetics, analgesics -Status post venting gastrostomy on 9/2 by IR. - TPN started through PICC, will continue.    Malignant ascites: s/p paracentesis 3.7L on 8/31 - Cytology positive for adenocarcinoma.  Pleural effusion: s/p right thoracentesis 1.1L on 9/1  Severe protein calorie malnutrition: With multifactorial third-spacing of fluids worsened by hypoalbuminemia.  - TPN as above.   Pancytopenia: Due to chemotherapy.  - Per onc, transfuse for hgb <8g/dl or platelets <10k.  CBC showsed hemoglobin 7.1.  On 05/05/2020 and she received 1 unit PRBC.  Posttransfusion hemoglobin 9.2.  Her white cells as well as platelets are improving. -Continue to hold heparin with thrombocytopenia and anemia.  AKI on stage IIIa CKD: Due to prerenal azotemia, possibly ATN. Developing after ureteral stent was placed.  GFR improving.  Nephrology signed off. -Continue to hold ACE and HCTZ Renal function is improving very slowly and is very close to her baseline.  CAD: s/p PTCA. No anginal complaints.  - holding ASA with thrombocytopenia and anemia.  Now that her blood pressure has improved and she remains tachycardic, still unable to take p.o. so I will resume her beta-blocker in the IV form with Lopressor 5 mg every 8 hours.  Hyperkalemia: Resolved  GERD - Continue PPI  Goals of care: Goals of Tx are palliative in nature, the patient is aware of this.  - Received chemotherapy for malignancy.  -Now thoroughly informed about possible course of her metastatic cancer.  Palliative care also on board and very helpful.  Patient has now accepted the reality of poor outcome of her cancer and has elected to be DNR.  CODE STATUS changed.  She is leaning towards going home with hospice however she has not made a final decision and plans to meet with her son today.  Obesity: Estimated body mass index is 39.73 kg/m as calculated from the following:    Height as of this encounter: 5\' 4"  (1.626 m).   Weight as of this encounter: 105 kg.  DVT prophylaxis: SCDs Code Status: Full Family Communication: None present at bedside. Disposition Plan:  Status is: Inpatient  Remains inpatient appropriate because:Persistent severe electrolyte disturbances and Inpatient level of care appropriate due to severity of illness  Dispo: The patient is from: Home              Anticipated d/c is to: Home              Anticipated d/c date is: 1 to 2 days              Patient currently is not medically stable to d/c.  Consultants:   Oncology  GynOnc  Nephrology  General surgery  GI  Interventional radiology  Procedures:  04/15/20 Dr. Alinda Money CYSTOSCOPY WITH RETROGRADE PYELOGRAM/URETERAL STENT PLACEMENT    Cervical biopsy  Subjective: Patient seen and examined.  She complains of having nausea through the night and this morning.  No vomiting.  Still not passing gas or any bowel movement.  No abdominal pain.  No other complaint.  Objective: Vitals:   05/09/20 0000 05/09/20 0453 05/09/20 0500 05/09/20 0634  BP:  101/61    Pulse:  95    Resp: 20 18 17 14   Temp:  (!) 97.5 F (36.4 C)    TempSrc:  Axillary    SpO2:  100%    Weight:      Height:        Intake/Output Summary (Last 24 hours) at 05/09/2020 1123 Last data filed at 05/09/2020 0300 Gross per 24 hour  Intake 600.05 ml  Output 800 ml  Net -199.95 ml   Filed Weights   04/13/20 1234 04/24/20 0830 05/01/20 0838  Weight: 98 kg 98.2 kg 105 kg    General exam: Appears calm and comfortable  Respiratory system: Clear to auscultation. Respiratory effort normal. Cardiovascular system: S1 & S2 heard, RRR. No JVD, murmurs, rubs, gallops or clicks. No pedal edema. Gastrointestinal system: Abdomen is distended, soft and nontender. No organomegaly or masses felt.  Absent bowel sounds Central nervous system: Alert and oriented. No focal neurological deficits. Extremities: Symmetric 5 x 5  power. Skin: No rashes, lesions or ulcers.  Psychiatry: Judgement and insight appear normal. Mood & affect appropriate.    Data Reviewed: I have personally reviewed following labs and imaging studies  CBC: Recent Labs  Lab 05/05/20 0331 05/06/20 0850 05/07/20 0335 05/08/20 0415 05/09/20 0355  WBC 7.3 11.1* 8.3 7.8 8.5  NEUTROABS 6.0 9.4* 6.6 6.1 6.4  HGB 7.1* 9.2* 8.8* 9.1* 9.6*  HCT 21.7* 28.8* 27.8* 29.4* 31.1*  MCV 85.4 87.0 88.3 90.2 89.4  PLT 115* 143* 139* 170 098   Basic Metabolic Panel: Recent Labs  Lab 05/04/20 0348 05/04/20 0348 05/05/20 0331 05/06/20 0345 05/07/20 0335 05/08/20 0415 05/09/20 0355  NA 142   < > 146* 147* 148* 146* 143  K 3.6   < > 3.7 3.8 3.6 4.3 4.7  CL 106   < > 106 106 110 111 111  CO2 26   < > 29 29 28 25 23   GLUCOSE 125*   < > 132* 134* 124* 129* 134*  BUN 96*   < > 85* 62* 64* 64* 74*  CREATININE 1.78*   < > 1.41* 1.34* 1.42* 1.31* 1.40*  CALCIUM 8.4*   < > 8.7* 8.8* 8.5* 8.1* 8.3*  MG 2.4  --  2.4 2.3 2.4 2.4  --   PHOS 3.3   < > 2.7 3.7 4.3 3.4 3.2   < > = values in this interval not displayed.   GFR: Estimated Creatinine Clearance: 46.7 mL/min (A) (by C-G formula based on SCr of 1.4 mg/dL (H)). Liver Function Tests: Recent Labs  Lab 05/03/20 0405 05/04/20 0348 05/08/20 0415  AST 25 21 29   ALT 25 17 27   ALKPHOS 65 53 80  BILITOT 0.7 0.7 0.6  PROT 4.9* 5.1* 5.6*  ALBUMIN 1.7* 2.3* 2.1*   Coagulation Profile: Recent Labs  Lab 05/04/20 0348  INR 1.2   CBG: Recent Labs  Lab 05/08/20 0721 05/08/20 1549 05/08/20 2112 05/09/20 0017 05/09/20 0745  GLUCAP 135* 99 117* 131* 116*   Urine analysis:    Component Value Date/Time   COLORURINE AMBER (A) 05/02/2020 1830   APPEARANCEUR CLOUDY (A) 05/02/2020 1830   LABSPEC 1.015 05/02/2020 1830   PHURINE 5.0 05/02/2020 1830   GLUCOSEU NEGATIVE 05/02/2020 1830   HGBUR LARGE (A) 05/02/2020 1830   [place] 05/02/2020 1830   [place] 05/02/2020 1830    PROTEINUR 30 (A) 05/02/2020 1830   NITRITE NEGATIVE 05/02/2020 1830   LEUKOCYTESUR SMALL (A) 05/02/2020 1830   Recent Results (from the past 240 hour(s))  Body fluid culture     Status: None   Collection Time: 05/03/20  1:30 PM   Specimen: PATH Cytology Pleural fluid  Result Value Ref Range Status   Specimen Description   Final    PLEURAL RIGHT Performed at [facility], [place] [address], [place], [place] [address]    Special Requests   Final    NONE Performed at [facility], [place] [address], [place], [place] [address]    Gram Stain   Final    RARE WBC PRESENT, PREDOMINANTLY MONONUCLEAR NO ORGANISMS SEEN    Culture   Final    NO GROWTH 3 DAYS Performed at [facility], [place] [address], [place], [place] [address]    Report Status 05/06/2020 FINAL  Final      Radiology Studies: No results found.  Scheduled Meds: . Chlorhexidine Gluconate Cloth  6 each Topical Daily  . metoprolol tartrate  5 mg Intravenous Q8H  . pantoprazole (PROTONIX) IV  40 mg Intravenous Q24H  . sodium chloride flush  10-40 mL Intracatheter Q12H   Continuous Infusions: . TPN ADULT (ION) 75 mL/hr at 05/08/20 1714  . TPN ADULT (ION)       LOS: 25 days   Time spent: 26 minutes.  Darliss Cheney, MD [facility] [email or web address] 05/09/2020, 11:23 AM

## 2020-05-09 NOTE — Progress Notes (Signed)
Attempted to remove TPA from purple lumen after several hours; unsuccessful and no blood return. Lumen flushes easily. Contacted RN via [employer] and requested she contact physician for order to obtain chest x-ray for PICC tip placement and 2nd dose of TPA if appropriate. Summer, RN verbalized understanding.

## 2020-05-09 NOTE — Progress Notes (Addendum)
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, biopsy showed adenocarcinoma Received cycle 1 of carboplatin and paclitaxel on 04/20/2020 and tolerated well We will continue to monitor for side effects of treatment and monitor lab work closely She developed pancytopenia secondary to chemotherapy-WBC recovering but has had persistent anemia and thrombocytopenia-repeat CBC from today shows mild anemia again Continue supportive care for now Next dose of chemotherapy is due around 9/9, however, given her significant decline in performance status, it is not clear to me that she is a candidate for further treatment We discussed that she is likely too weak and deconditioned to get chemotherapy this week and she states understanding of this Ultimately, I recommend palliative care/hospice as she does not have any clinical response to treatment I explained to the patient and her son the rationale of why surgery or radiation are not available treatment options for her We discussed the role of second opinion in another facility but at this point they are not interested  Pancytopenia secondary to chemotherapy Recommend transfusion if hemoglobin is less than 8 and platelet count less than 10 She has received 2 units PRBCs this admission on 8/31 and 9/3 Hemoglobin stable this morning at 9.6   AKI due to hydronephrosis and possible dehydration/3rd spacing Renal function stable at this time nephrology consulted, will defer to nephrologist for management  Protein calorie malnutrition evidence of 3rd spacing in addition to poor oral intake Dietitian is following Continue TPN She is aware that TPN is a short-term management.  Once we stop TPN and IV fluids, she will likely succumb to  dehydration and subsequent risk of death   subacute bowel obstruction secondary to carcinomatosis NG tube was placed for about a week and then G-tube was placed on 05/04/2020 NG tube now discontinued NG tube is hooked to suction G-tube is strictly palliative in nature  Ascites Moderate ascites noted on CT scan she had repeat paracentesis on August 31  Recent shortness of breath/respiratory distress/vascular congestion with pulmonary edema, resolved On oxygen this morning She has received Lasix on several occasions this admission Status post thoracentesis on 05/03/2020 with overall improvement of her breathing   Goals of care discussion She is aware she have stage IV disease Treatment goal is palliative The goal for treatment in this hospital stay is to resolve her bowel obstruction prior to discharge Overall, she has significant decline in performance status I have been trying to get the patient to participate with physical therapy, sitting on the chair and move around as much as possible  however, she has remained bedbound and states that she is simply too weak to get out of bed We have had extensive discussions with the patient and her son regarding goals of care She is due for additional chemotherapy later this week, but is too deconditioned at this point to receive it The patient is now a DNR Palliative care is seeing the patient and continues ongoing discussions regarding goals of care Her son felt that she is not ready to go home due to home situation with black mold and broken air conditioner We discussed the role of inpatient/residential hospice facility He will make decision when to stop TPN and transition care to full comfort measures  Discharge planning She will likely be here for the next  2-3 days I recommend discontinuation of IV fluids and TPN soon once they have made decision about transitioning of care to comfort measures This will likely happen over the next 2  days Estimated prognosis less than 14 days Appreciate palliative care consult I have addressed all her questions I will sign off All questions were answered. The patient knows to call the clinic with any problems, questions or concerns.   Eileen Bussing, NP 05/09/2020 9:44 AM [name], MD Subjective:  The patient reports that she was "sick" overnight She states that she had some nausea and vomiting that has now resolved NG tube is in removed and G-tube is draining without any difficulty She has a small amount of liquid stool in her rectal tube drainage pouch Denies abdominal pain States that she did not get out of bed this weekend and that she is simply too weak to get out of bed Appreciated palliative care consult Her son stated that the home environment is not conducive to receive her home yet.  They are dealing with black mold and broken air conditioner He is aware of the bad prognosis once we discontinue IV fluids and TPN  Objective:  Vitals:   05/09/20 0500 05/09/20 0634  BP:    Pulse:    Resp: 17 14  Temp:    SpO2:       Intake/Output Summary (Last 24 hours) at 05/09/2020 0944 Last data filed at 05/09/2020 0300 Gross per 24 hour  Intake 600.05 ml  Output 800 ml  Net -199.95 ml    GENERAL: Awake and alert, no distress HEART: regular rate & rhythm and no murmurs and no lower extremity edema ABDOMEN:abdomen distended, her bowel sounds are less active.  Stool is noted at the rectal tube Musculoskeletal:no cyanosis of digits and no clubbing  NEURO: alert & oriented x 3 with fluent speech, but somewhat sleepy   Labs:  Recent Labs    05/03/20 0405 05/03/20 0405 05/04/20 0348 05/05/20 0331 05/07/20 0335 05/08/20 0415 05/09/20 0355  NA 139   < > 142   < > 148* 146* 143  K 3.8   < > 3.6   < > 3.6 4.3 4.7  CL 108   < > 106   < > 110 111 111  CO2 22   < > 26   < > '28 25 23  ' GLUCOSE 135*   < > 125*   < > 124* 129* 134*  BUN 100*   < > 96*   < > 64* 64* 74*   CREATININE 1.92*   < > 1.78*   < > 1.42* 1.31* 1.40*  CALCIUM 8.1*   < > 8.4*   < > 8.5* 8.1* 8.3*  GFRNONAA 27*   < > 29*   < > 38* 42* 39*  GFRAA 31*   < > 34*   < > 44* 49* 45*  PROT 4.9*  --  5.1*  --   --  5.6*  --   ALBUMIN 1.7*  --  2.3*  --   --  2.1*  --   AST 25  --  21  --   --  29  --   ALT 25  --  17  --   --  27  --   ALKPHOS 65  --  53  --   --  80  --   BILITOT 0.7  --  0.7  --   --  0.6  --    < > =  values in this interval not displayed.    Studies:  CT ABDOMEN WO CONTRAST  Result Date: 05/02/2020 CLINICAL DATA:  Evaluate anatomy prior to potential percutaneous gastrostomy tube placement. EXAM: CT ABDOMEN WITHOUT CONTRAST TECHNIQUE: Multidetector CT imaging of the abdomen was performed following the standard protocol without IV contrast. COMPARISON:  CT abdomen pelvis - 04/14/2020; abdominal radiograph-05/01/2020; 04/29/2020; 04/27/2020 FINDINGS: Lack of intravenous contrast limits the ability to evaluate solid abdominal organs. Lower chest: Limited visualization of the lower thorax demonstrates small to moderate-sized bilateral pleural effusions with associated bibasilar consolidative opacities, right greater than left. Borderline cardiomegaly.  Small pericardial effusion. Hepatobiliary: There is mild nodularity of the hepatic contour suggestive of cirrhotic change. Normal noncontrast appearance of the gallbladder given degree distention. No radiopaque gallstones. Moderate volume intra-abdominal ascites. Pancreas: Normal noncontrast appearance of the pancreas. Spleen: Normal noncontrast appearance of the spleen. Adrenals/Urinary Tract: Post right-sided ureteral stent placement. The right kidney is slightly atrophic in comparison to the left. No renal stones. There is a minimal amount likely age and body habitus related bilateral perinephric stranding. No urine obstruction. Normal noncontrast appearance the bilateral adrenal glands. The urinary bladder was not imaged.  Stomach/Bowel: The anterior wall of the gastric antrum is well apposed against the ventral wall of the upper abdomen without interposition of the hepatic parenchyma or transverse colon. Enteric tube tip terminates within the descending portion of the duodenum. There is moderate distension majority of the visualized loops of large and small bowel, similar to abdominal radiographs, incompletely evaluated though potentially the sequela of ileus. No pneumoperitoneum, pneumatosis or portal venous gas. Vascular/Lymphatic: Moderate amount of atherosclerotic plaque within a normal caliber abdominal aorta. No bulky retroperitoneal or mesenteric adenopathy on this noncontrast examination. Other: Moderate to large amount of diffuse body wall anasarca, most conspicuous about the midline of the low back and bilateral flanks. Musculoskeletal: No acute or aggressive osseous abnormalities. Stigmata of dish within the lower thoracic spine. IMPRESSION: 1. Despite amenable gastric anatomy, the presence moderate volume intra-abdominal ascites renders the patient a poor candidate for gastrostomy tube placement. 2. Suspected ileus, incompletely evaluated. 3. Moderate sized bilateral pleural effusions with associated bibasilar atelectasis, right greater than left. 4. Small pericardial effusion. Further evaluation cardiac echo could be performed as indicated. 5.  Aortic Atherosclerosis (ICD10-I70.0). 6. Post right-sided ureteral stent placement with mild asymmetric atrophy of the right kidney comparison to left. No evidence of urinary obstruction. Electronically Signed   By: Sandi Mariscal M.D.   On: 05/02/2020 08:14   DG Chest 1 View  Result Date: 04/30/2020 CLINICAL DATA:  Shortness of breath and cough. EXAM: CHEST  1 VIEW COMPARISON:  Chest radiograph dated 04/28/2020. FINDINGS: The heart size and mediastinal contours are within normal limits. Opacity of the right hemithorax likely reflects a layering pleural effusion with associated  atelectasis. A small left pleural effusion with associated atelectasis is noted. Mild-to-moderate bibasilar airspace opacities likely contribute. Mild diffuse bilateral interstitial opacities are noted. There is no pneumothorax. A right upper extremity peripherally inserted central venous catheter tip overlies the superior vena cava. An enteric tube enters the stomach and terminates below the field of view. IMPRESSION: Bilateral pleural effusions with associated atelectasis and/or airspace opacities. Mild diffuse bilateral interstitial opacities may represent pulmonary edema or infection. Electronically Signed   By: Zerita Boers M.D.   On: 04/30/2020 15:04   DG Chest 1 View  Result Date: 04/20/2020 CLINICAL DATA:  Increasing short of breath EXAM: CHEST  1 VIEW COMPARISON:  04/13/2020, CT 04/15/2020 FINDINGS:  Right upper extremity central venous catheter tip over the SVC. Cardiomegaly with vascular congestion and diffuse interstitial opacity consistent with edema. Moderate right-sided pleural effusion, likely layering and resulting in hazy asymmetric appearance of the right thorax. At least small left pleural effusion. Bibasilar consolidations. No pneumothorax. IMPRESSION: 1. Cardiomegaly with vascular congestion and diffuse interstitial pulmonary edema. 2. Bilateral pleural effusions, at least moderate on the right and small on the left. 3. Bibasilar consolidations Electronically Signed   By: Donavan Foil M.D.   On: 04/20/2020 22:20   DG Chest 2 View  Result Date: 04/13/2020 CLINICAL DATA:  Weakness diarrhea EXAM: CHEST - 2 VIEW COMPARISON:  07/28/2004 FINDINGS: Heart size upper normal.  Vascularity normal.  Coronary stent noted. Small bilateral pleural effusions. Mild bibasilar atelectasis/infiltrate. IMPRESSION: Interval development of mild bibasilar airspace disease and small pleural effusions. Negative for heart failure. Electronically Signed   By: Franchot Gallo M.D.   On: 04/13/2020 13:57   DG Abd  1 View  Result Date: 05/03/2020 CLINICAL DATA:  Nasogastric tube placement EXAM: ABDOMEN - 1 VIEW COMPARISON:  Portable exam 1214 hours compared 05/02/2020 FINDINGS: Tip of nasogastric tube projects over mid stomach. Upper normal heart size with pulmonary vascular congestion. Mediastinal contours normal. RIGHT arm PICC line tip projects over SVC. LEFT lower lobe atelectasis versus consolidation. Moderate RIGHT pleural effusion and basilar atelectasis. Gas in colon. RIGHT ureteral stent visualized. IMPRESSION: Tip of nasogastric tube projects over mid stomach. Persistent LEFT lower lobe atelectasis versus consolidation. Moderate RIGHT pleural effusion. Electronically Signed   By: Lavonia Dana M.D.   On: 05/03/2020 12:48   DG Abd 1 View  Result Date: 05/01/2020 CLINICAL DATA:  Follow-up ileus EXAM: ABDOMEN - 1 VIEW COMPARISON:  04/29/2020 abdominal radiograph FINDINGS: Enteric tube terminates in the descending duodenum. Stable right nephroureteral stent. Mildly dilated small bowel loops in right abdomen appears slightly increased. Moderate gaseous distention of the colon appears similar. No evidence of pneumatosis or pneumoperitoneum. No radiopaque nephrolithiasis. IMPRESSION: 1. Enteric tube terminates in the descending duodenum. 2. Mildly dilated small bowel loops in right abdomen appear slightly increased. Stable moderate gaseous distention of the colon. Findings are compatible with persistent adynamic ileus. Electronically Signed   By: Ilona Sorrel M.D.   On: 05/01/2020 10:05   DG Abd 1 View  Result Date: 04/29/2020 CLINICAL DATA:  Follow-up ileus EXAM: ABDOMEN - 1 VIEW COMPARISON:  April 27, 2020 FINDINGS: A right ureteral stent is stable. In NG tube is identified. I suspect the distal tip may be in the proximal duodenum. Air-filled prominent colon remains. No small bowel dilatation. No other acute abnormalities. IMPRESSION: 1. Support apparatus as above. 2. Continued air-filled prominent colon may  represent ileus. No other abnormalities. Electronically Signed   By: Dorise Bullion III M.D   On: 04/29/2020 09:40   DG Abd 1 View  Result Date: 04/27/2020 CLINICAL DATA:  Obstruction EXAM: ABDOMEN - 1 VIEW COMPARISON:  Portable exam 1047 hours compared 04/22/2020 FINDINGS: RIGHT ureteral stent unchanged. Gaseous distention of colon from tip of cecum through splenic flexure. Absent gas within descending and rectosigmoid colon. Small bowel gas pattern normal. No bowel wall thickening or pathologic calcification. IMPRESSION: Mild gaseous distention of the proximal half the colon with absent gas distally, could reflect ileus but unable to exclude distal colonic obstruction with this appearance. Electronically Signed   By: Lavonia Dana M.D.   On: 04/27/2020 11:01   CT CHEST W CONTRAST  Result Date: 04/15/2020 CLINICAL DATA:  Cancer of unknown  primary, staging. EXAM: CT CHEST WITH CONTRAST TECHNIQUE: Multidetector CT imaging of the chest was performed during intravenous contrast administration. CONTRAST:  20m OMNIPAQUE IOHEXOL 300 MG/ML  SOLN COMPARISON:  None. FINDINGS: Cardiovascular: There is mild calcification of the aortic arch. Normal heart size. No pericardial effusion. A coronary artery stent is seen. Mediastinum/Nodes: No enlarged mediastinal, hilar, or axillary lymph nodes. The thyroid gland and trachea demonstrate no significant findings. There is a small hiatal hernia. Lungs/Pleura: Mild bilateral lower lobe atelectasis is seen, right slightly greater than left. Very mild slightly nodular appearing scarring and/or atelectasis is seen along the posterior aspect of the inferior left upper lobe. There is a small left pleural effusion. A moderate sized right pleural effusion is also noted. No pneumothorax is identified. Upper Abdomen: A moderate amount of abdominal free fluid is seen. There is moderate to marked severity right-sided hydronephrosis with delayed renal cortical enhancement involving the  right kidney. Musculoskeletal: No chest wall abnormality. No acute or significant osseous findings. Degenerative changes seen throughout the thoracic spine. IMPRESSION: 1. Small left pleural effusion with a moderate sized right pleural effusion. 2. Mild bilateral lower lobe atelectasis, right slightly greater than left. 3. Very mild slightly nodular appearing scarring and/or atelectasis along the posterior aspect of the inferior left upper lobe. 4. Moderate amount of abdominal free fluid. 5. Moderate to marked severity right-sided hydronephrosis with delayed renal cortical enhancement involving the right kidney. This is suggestive of partial renal obstruction. 6. Small hiatal hernia. 7. Aortic atherosclerosis. Aortic Atherosclerosis (ICD10-I70.0). Electronically Signed   By: TVirgina NorfolkM.D.   On: 04/15/2020 15:38   IR GASTROSTOMY TUBE MOD SED  Result Date: 05/05/2020 CLINICAL DATA:  Metastatic cervical carcinoma with intractable small-bowel obstruction requiring continuous nasogastric decompression. Bowel obstruction has improved with nasogastric decompression and request has been made to place a venting percutaneous gastrostomy tube so that the patient can be discharged from the hospital without a nasogastric tube. EXAM: PERCUTANEOUS GASTROSTOMY TUBE PLACEMENT ANESTHESIA/SEDATION: Formal moderate conscious sedation was not administered and the patient received 0.5 mg IV Versed for the procedure. CONTRAST:  227mOMNIPAQUE IOHEXOL 300 MG/ML  SOLN MEDICATIONS: No additional medications. FLUOROSCOPY TIME:  6 minutes and 54 seconds.  89.0 mGy. PROCEDURE: The procedure, risks, benefits, and alternatives were explained to the patient. Questions regarding the procedure were encouraged and answered. The patient understands and consents to the procedure. A time-out was performed prior to initiating the procedure. A 5-French catheter was then advanced through the patient's mouth under fluoroscopy into the esophagus  and to the level of the stomach. This catheter was used to insufflate the stomach with air under fluoroscopy. The abdominal wall was prepped with Betadine in a sterile fashion, and a sterile drape was applied covering the operative field. A sterile gown and sterile gloves were used for the procedure. Local anesthesia was provided with 1% Lidocaine. A skin incision was made in the upper abdominal wall. Under fluoroscopy, an 18 gauge trocar needle was advanced into the stomach. Contrast injection was performed to confirm intraluminal position of the needle tip. A single T tack was then deployed in the lumen of the stomach. This was brought up to tension at the skin surface. Over a guidewire, a 9-French sheath was advanced into the lumen of the stomach. The wire was left in place as a safety wire. A loop snare device from a percutaneous gastrostomy kit was then advanced into the stomach. A floppy guide wire was advanced through the orogastric catheter under fluoroscopy  in the stomach. The loop snare advanced through the percutaneous gastric access was used to snare the guide wire. This allowed withdrawal of the loop snare out of the patient's mouth by retraction of the orogastric catheter and wire. A 20-French bumper retention gastrostomy tube was looped around the snare device. It was then pulled back through the patient's mouth. The retention bumper was brought up to the anterior gastric wall. The T tack suture was cut at the skin. The exiting gastrostomy tube was cut to appropriate length and a feeding adapter applied. The catheter was injected with contrast material to confirm position and a fluoroscopic spot image saved. The tube was then flushed with saline. A dressing was applied over the gastrostomy exit site. COMPLICATIONS: None. FINDINGS: The stomach distended well with air allowing safe placement of the gastrostomy tube. After placement, the tip of the gastrostomy tube lies in the body of the stomach.  IMPRESSION: Percutaneous gastrostomy with placement of a 20-French bumper retention tube in the body of the stomach. This tube can be immediately connected to wall suction as needed. Electronically Signed   By: Aletta Edouard M.D.   On: 05/05/2020 09:57   US Paracentesis  Result Date: 05/02/2020 INDICATION: Patient with history of stage IV cervical cancer/adenocarcinoma, carcinomatosis, ascites. Request made for diagnostic and therapeutic paracentesis. EXAM: ULTRASOUND GUIDED DIAGNOSTIC AND THERAPEUTIC PARACENTESIS MEDICATIONS: 1% lidocaine to skin and subcutaneous tissue COMPLICATIONS: None immediate. PROCEDURE: Informed written consent was obtained from the patient after a discussion of the risks, benefits and alternatives to treatment. A timeout was performed prior to the initiation of the procedure. Initial ultrasound scanning demonstrates a large amount of ascites within the right lower abdominal quadrant. The right lower abdomen was prepped and draped in the usual sterile fashion. 1% lidocaine was used for local anesthesia. Following this, a 19 gauge, 10-cm, Yueh catheter was introduced. An ultrasound image was saved for documentation purposes. The paracentesis was performed. The catheter was removed and a dressing was applied. The patient tolerated the procedure well without immediate post procedural complication. FINDINGS: A total of approximately 3.7 liters of slightly hazy, amber fluid was removed. Samples were sent to the laboratory as requested by the clinical team. IMPRESSION: Successful ultrasound-guided diagnostic and therapeutic paracentesis yielding 3.7 liters of peritoneal fluid. Read by: Rowe Robert, PA-C Electronically Signed   By: Jerilynn Mages.  Shick M.D.   On: 05/02/2020 11:15   DG Chest Port 1 View  Result Date: 05/03/2020 CLINICAL DATA:  Status post right thoracentesis. EXAM: PORTABLE CHEST 1 VIEW COMPARISON:  04/30/2020 FINDINGS: Decreased right pleural effusion, no longer apparent on the  right. Similar small left pleural effusion. No discernible pneumothorax on this semi erect study. Overlying left basilar opacity. Low lung volumes. Gastric tube courses below the diaphragm with the tip likely in the stomach. Similar cardiac silhouette. Right PICC with the tip projecting at the distal SVC. IMPRESSION: 1. Decreased right pleural effusion, no longer apparent. No discernible pneumothorax. 2. Similar small left pleural effusion. Overlying left basilar opacity, which may represent atelectasis, aspiration, or pneumonia. Electronically Signed   By: Margaretha Sheffield MD   On: 05/03/2020 13:50   DG Chest Port 1 View  Result Date: 04/28/2020 CLINICAL DATA:  64 year old female with persistent cough. Query fluid overload. EXAM: PORTABLE CHEST 1 VIEW COMPARISON:  Portable chest 04/20/2020 and earlier. FINDINGS: Portable AP semi upright view at 1214 hours. Stable right PICC line. Enteric tube has been placed and courses to the stomach, tip not included. Improved bilateral ventilation,  but continued basilar predominant increased interstitial opacity, and dense retrocardiac opacity. Veiling opacity on the right is compatible with a superimposed pleural effusion. No pneumothorax. Upper lung pulmonary vascularity appears more normal now. Mediastinal contours are stable and within normal limits. Dense retrocardiac opacity without air bronchograms. Paucity of bowel gas. No acute osseous abnormality identified. IMPRESSION: 1. Suspect regression of pulmonary edema since 04/20/2020, but with residual vascular congestion, small right pleural effusion, and superimposed left lower lobe collapse or consolidation. 2. Enteric tube placed into the stomach, tip not included. Electronically Signed   By: Genevie Ann M.D.   On: 04/28/2020 12:35   DG Abd 2 Views  Result Date: 05/02/2020 CLINICAL DATA:  Weakness, diarrhea EXAM: ABDOMEN - 2 VIEW COMPARISON:  05/01/2020 FINDINGS: Nasogastric tube likely extends to the second portion  of the duodenum with its proximal side hole in the region of the pylorus. This is unchanged from prior examination. Dilated loops of small bowel are again seen within the mid abdomen and gas is seen within multiple nondilated loops of large bowel in keeping with changes of an underlying of partial small bowel obstruction or ileus. No gross free intraperitoneal gas. Moderate right pleural effusion again noted. Right double-J ureteral stent overlies its expected position. Pelvis excluded from view. IMPRESSION: 1. Findings consistent with an underlying partial small bowel obstruction or ileus, similar to prior examination. 2. Nasogastric tube tip in the second portion of the duodenum. Electronically Signed   By: Fidela Salisbury MD   On: 05/02/2020 19:27   DG Abd Portable 1V  Result Date: 04/27/2020 CLINICAL DATA:  Nasogastric tube placement EXAM: PORTABLE ABDOMEN - 1 VIEW COMPARISON:  04/27/2020 FINDINGS: Interval placement of esophagogastric tube, tip and side port below the diaphragm, looped in the gastric fundus. Diffusely distended colon similar to prior examination. Partially imaged right double-J ureteral stent. IMPRESSION: 1. Interval placement of esophagogastric tube, tip and side port below the diaphragm, looped in the gastric fundus. 2. Diffusely distended colon similar to prior examination. Electronically Signed   By: Eddie Candle M.D.   On: 04/27/2020 13:33   DG Abd Portable 2V  Result Date: 04/22/2020 CLINICAL DATA:  Decreased bowel sounds EXAM: PORTABLE ABDOMEN - 2 VIEW COMPARISON:  04/21/2020 FINDINGS: Supine frontal views of the abdomen and pelvis are obtained. Right ureteral stent is stable. There is increasing gaseous distention of the colon, most compatible with ileus. No evidence of small-bowel obstruction. No masses or abnormal calcifications. IMPRESSION: 1. Progressive gaseous distention of the colon most consistent with ileus. 2. Stable right ureteral stent. Electronically Signed   By:  Randa Ngo M.D.   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 Hypoactive bowel sounds. EXAM: PORTABLE ABDOMEN - 2 VIEW COMPARISON:  KUB 04/20/2020.  CT Abdomen and Pelvis 04/14/2020. FINDINGS: Upright and supine views of the abdomen and pelvis. Stable right double-J ureteral stent. Mildly to moderately gas distended large bowel again noted, with gas present to the mid sigmoid. As before, no dilated small bowel loops. But gas is increased since 04/14/2020. No pneumoperitoneum. Stable lung bases, right pleural effusion again evident. Stable visualized osseous structures. IMPRESSION: 1. Continued gas distended colon to the sigmoid, although no dilated small bowel to strongly suggest mechanical obstruction. Continue to favor ileus. 2. No free air. Stable right ureteral stent. Persistent right pleural effusion. Electronically Signed   By: Herminio Heads.D.  On: 04/21/2020 13:54   DG Abd Portable 2V  Result Date: 04/20/2020 CLINICAL DATA:  Hypoactive bowel sounds. EXAM: PORTABLE ABDOMEN - 2 VIEW COMPARISON:  No prior. FINDINGS: Double-J right ureteral stent in good anatomic position. Colon is slightly distended. Cecum measures 8.9 cm in diameter. Although these findings most likely represent colonic ileus follow-up exam suggested to exclude colonic obstruction. No small bowel distention. No free air. Bibasilar atelectasis/infiltrates. Small right pleural effusion. IMPRESSION: 1.  Double-J right ureteral stent in good anatomic position. 2. Colon is slightly distended. Cecum measures 8.9 cm. Although these findings most likely represent colonic ileus, follow-up exam suggested to exclude colonic obstruction. No small bowel distention. No free air. 3.  Bibasilar atelectasis/infiltrates. Electronically Signed   By: Marcello Moores  Register   On: 04/20/2020 13:00   DG C-Arm 1-60 Min-No Report  Result Date:  04/15/2020 Fluoroscopy was utilized by the requesting physician.  No radiographic interpretation.   CT RENAL STONE STUDY  Result Date: 04/14/2020 CLINICAL DATA:  64 year old female with hematuria. EXAM: CT ABDOMEN AND PELVIS WITHOUT CONTRAST TECHNIQUE: Multidetector CT imaging of the abdomen and pelvis was performed following the standard protocol without IV contrast. COMPARISON:  None. FINDINGS: Evaluation of this exam is limited in the absence of intravenous contrast. Lower chest: Partially visualized moderate right and small left pleural effusions. There is associated partial compressive atelectasis of the lower lobes. Pneumonia is not excluded. Clinical correlation is recommended. There is coronary vascular calcification. Partially visualized trace pericardial effusion. There is no intra-abdominal free air. Small ascites. Hepatobiliary: No focal liver abnormality is seen. No gallstones, gallbladder wall thickening, or biliary dilatation. Pancreas: Unremarkable. No pancreatic ductal dilatation or surrounding inflammatory changes. Spleen: Normal in size without focal abnormality. Adrenals/Urinary Tract: The adrenal glands unremarkable. The left kidney is unremarkable. There is a moderate right hydronephrosis with mild right hydroureter. No stone identified. There is a transition point in the distal right ureter or right UVJ. The urinary bladder is minimally distended. There is possible mild nodular thickening of the posterior bladder wall adjacent to the right UVJ (77/2). There is loss of fat plane between the anterior lower uterus/cervix and posterior bladder wall. The lower uterus/cervical region is somewhat enlarged. Findings concerning for a neoplastic process either arising from the bladder urothelium or from the cervix and invading into the posterior bladder wall with obstruction of the right UVJ or distal right ureter. Stomach/Bowel: There is a small hiatal hernia. There is no bowel obstruction. There  is sigmoid diverticulosis. The appendix is not visualized with certainty. No inflammatory changes identified in the right lower quadrant. Vascular/Lymphatic: Moderate aortoiliac atherosclerotic disease. The IVC is unremarkable. No portal venous gas. There is no adenopathy. Reproductive: The uterus is anteverted. Enlargement of the lower uterus/cervical region as described above. Other: There is diffuse omental nodularity and caking consistent with metastatic disease. Musculoskeletal: Osteopenia. No acute osseous pathology. IMPRESSION: 1. Findings concerning for a neoplastic process either arising from the bladder urothelium or from the cervix and invading into the posterior bladder wall with obstruction of the right UVJ or distal right ureter. There is associated moderate right hydronephrosis. Sampling of the ascitic fluid may provide further diagnostic information. 2. Diffuse omental nodularity and caking consistent with metastatic disease. 3. Moderate right and small left pleural effusions with associated partial compressive atelectasis of the lower lobes. Pneumonia is not excluded. Clinical correlation is recommended. 4. Sigmoid diverticulosis. No bowel obstruction. 5. Aortic Atherosclerosis (ICD10-I70.0). Electronically Signed   By: Anner Crete M.D.   On:  04/14/2020 17:44   [place] EKG SITE RITE  Result Date: 04/18/2020 If Site Rite image not attached, placement could not be confirmed due to current cardiac rhythm.  US THORACENTESIS ASP PLEURAL SPACE W/IMG GUIDE  Result Date: 05/03/2020 INDICATION: Patient with metastatic gynecologic cancer with carcinomatosis, ascites, and now right pleural effusion. Request made for diagnostic and therapeutic right thoracentesis. EXAM: ULTRASOUND GUIDED DIAGNOSTIC AND THERAPEUTIC RIGHT THORACENTESIS MEDICATIONS: 10 mL 1% lidocaine COMPLICATIONS: None immediate. PROCEDURE: An ultrasound guided thoracentesis was thoroughly discussed with the patient and questions  answered. The benefits, risks, alternatives and complications were also discussed. The patient understands and wishes to proceed with the procedure. Written consent was obtained. Ultrasound was performed to localize and mark an adequate pocket of fluid in the right chest. The area was then prepped and draped in the normal sterile fashion. 1% Lidocaine was used for local anesthesia. Under ultrasound guidance a 6 Fr Safe-T-Centesis catheter was introduced. Thoracentesis was performed. The catheter was removed and a dressing applied. FINDINGS: A total of approximately 1.1 liters of clear, yellow fluid was removed. Samples were sent to the laboratory as requested by the clinical team. IMPRESSION: Successful ultrasound guided diagnostic and therapeutic right thoracentesis yielding 1.1 liters of pleural fluid. Read by: Brynda Greathouse PA-C Electronically Signed   By: Sandi Mariscal M.D.   On: 05/03/2020 14:49

## 2020-05-09 NOTE — Progress Notes (Addendum)
VAST RN to pt's bedside to hang TPN and complete CL care.  Earlier in shift pt's nurse was able to obtain blood return from purple lumen which was TPA'd earlier today; she reported she flushed it well.  Prior to hanging TPN, VAST RN was unable to obtain blood return from either lumen. Cath-Flo ordered to be instilled in red lumen and TPN hung via purple lumen.  Unit RN to contact physician and obtain order for chest x-ray to confirm PICC tip placement.

## 2020-05-09 NOTE — Progress Notes (Signed)
PHARMACY - TOTAL PARENTERAL NUTRITION CONSULT NOTE   Indication: Prolonged ileus  Patient Measurements: Height: 5\' 4"  (162.6 cm) Weight: 105 kg (231 lb 7.7 oz) IBW/kg (Calculated) : 54.7 TPN AdjBW (KG): 65.5 Body mass index is 39.73 kg/m. Usual Weight: 98 kg  Assessment: 64 y/o F found to have metastatic gynecologic cancer with carcinomatosis and ureteral obstruction s/p stent placement with minimal oral intake for over a week, possible ileus. Pharmacy consulted to manage TPN.   Glucose / Insulin: No history of DM. - cbgs  (goal <150): wnl - 2 units of insulin from SSI in 24 hours.  Will DC CBGs & SSI today Electrolytes: Na down to 143, K 4.7, Phos 3.2;  All other lytes WNL including CorrCa Renal: SCr 1.4, BUN 74 (up some) -  Nephrology signed off 9/3 LFTs / TGs: LFTs WNL; TG 141 (8/30), 142 (9/6) Prealbumin / albumin: 8.8 (8/30)/ 2.3 (9/2); 9.4 (9/6)/ 2.1 (9/6) Intake / Output; MIVF: - MIVF: none, NS stopped by MD on 8/29 GI Imaging:  -8/21 Abd x-ray: ileus vs obstruction -8/26 Abd x-ray: possible ileus, unable to exclude distal colonic obstruction -8/28 Abd x-ray: Ureteral stent is stable.  Continued air-filled prominent colon may represent ileus -8/30 Abd x-ray: Mildly dilated small bowel loops in right abdomen appear slightly increased. Stable moderate gaseous distention of the colon. Findings are compatible with persistent adynamic ileus.  -8/30 CT abd: Moderate volume intra-abdominal ascites. Suspected ileus, incompletely evaluated. Moderate sized bilateral pleural effusions with associated bibasilar atelectasis, right greater than left.  -8/31 Abd x-ray: Findings consistent with an underlying partial small bowel obstruction or ileus, similar to prior examination.  Surgeries / Procedures:  -8/14 ureteral stent placement  -8/31 paracentesis  -9/1 thoracentesis -9/2: G-tube placement Significant Events: -8/26: N/V, NGT placed. CLD > NPO  Central access: PICC 8/18 TPN  start date: 8/23  Nutritional Goals (per RD recommendation on 9/2):  kCal: 1700-1900, Protein: 80-90g, Fluid: 1.8 L/d  Goal TPN rate is 75 mL/hr (provides 90 g of protein and 1821 kcals per day); meeting >100% of patient needs  Note:  Intralipid product ordered in TPN from 8/23-8/28.  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 Pharmacist was informed on 8/28 that SMOFlipids had been used to prepare the TPN from 8/23-8/27 despite the Intralipid orders and that Intralipid was not available for the TPN on 8/28. The order was changed from Intralipid to SMOFlipids. Will continue with SMOFlipids at this time, as it appears she has been tolerating for several days.   Current Nutrition:  TPN, NPO  Plan:   At 1800:  Continue TPN at goal rate of 75 mL/hr  Electrolytes in TPN:   Add back standard Na 35, reduce K to 30 mEq/L, Ca 2.5 mEq/L, Mag 5 mEq/L, Phos back at 5 mMol/L  Cl:Ac ratio: 1:1  standard MVI and trace elements in TPN  DC CBGs & DC SSI  MIVF per MD (none at this time)   Monitor TPN labs on Monday/Thursday  F/u DC plans/palliative care: ? DC TPN  Eudelia Bunch, Pharm.D 05/09/2020 10:35 AM

## 2020-05-09 NOTE — Progress Notes (Signed)
Daily Progress Note   Patient Name: Eileen Burgess       Date: 05/09/2020 DOB: 1955/12/05  Age: 64 y.o. MRN#: [id number] Attending Physician: Darliss Cheney, MD Primary Care Physician: Patient, No Pcp Per Admit Date: 04/13/2020  Reason for Consultation/Follow-up: Establishing goals of care  Subjective:  I saw and examined Eileen Burgess this evening.  She reports speaking with Dr. Alvy Bimler this afternoon.  Patient planning to discuss hospice further with her family.  States she relies on her brother and son to help make decisions.  She is open to follow-up tomorrow.  Her brother is coming to see her at some point, but she is not sure when.  Length of Stay: 25  Current Medications: Scheduled Meds:  . alteplase  2 mg Intracatheter Once  . Chlorhexidine Gluconate Cloth  6 each Topical Daily  . metoprolol tartrate  5 mg Intravenous Q8H  . pantoprazole (PROTONIX) IV  40 mg Intravenous Q24H  . sodium chloride flush  10-40 mL Intracatheter Q12H    Continuous Infusions: . TPN ADULT (ION) 75 mL/hr at 05/09/20 1848    PRN Meds: acetaminophen **OR** acetaminophen, alteplase, alum & mag hydroxide-simeth, guaiFENesin-dextromethorphan, heparin lock flush, heparin lock flush, ipratropium-albuterol, LORazepam, nitroGLYCERIN, ondansetron **OR** ondansetron (ZOFRAN) IV, phenol, prochlorperazine, simethicone, sodium chloride flush, sodium chloride flush, sodium chloride flush  Physical Exam      General: Alert, awake, in no acute distress.  HEENT: No bruits, no goiter, no JVD Heart: Regular rate and rhythm. No murmur appreciated. Lungs: Good air movement, clear Abdomen: Soft, nontender, nondistended, positive bowel sounds. + PEG Ext: + edema Skin: Warm and dry  Vital Signs: BP (!) 96/58 (BP  Location: Left Arm)   Pulse 64   Temp (!) 97.5 F (36.4 C)   Resp 17   Ht 5\' 4"  (1.626 m)   Wt 105 kg   LMP  (LMP Unknown)   SpO2 98%   BMI 39.73 kg/m  SpO2: SpO2: 98 % O2 Device: O2 Device: Nasal Cannula O2 Flow Rate: O2 Flow Rate (L/min): 2 L/min  Intake/output summary:   Intake/Output Summary (Last 24 hours) at 05/09/2020 2158 Last data filed at 05/09/2020 1848 Gross per 24 hour  Intake 1750.23 ml  Output --  Net 1750.23 ml   LBM: Last BM Date: 05/09/20 Baseline Weight: Weight: 98 kg  Most recent weight: Weight: 105 kg       Palliative Assessment/Data:      Patient Active Problem List   Diagnosis Date Noted  . Goals of care, counseling/discussion 04/18/2020  . Cervical cancer ([place]) 04/17/2020  . Hyponatremia syndrome 04/14/2020  . UTI (urinary tract infection) 04/13/2020  . AKI (acute kidney injury) ([place]) 04/13/2020  . Hyponatremia 04/13/2020  . Orthostatic hypotension 04/13/2020  . Statin intolerance 02/19/2014  . Anxiety - very rare "panick attacks" 02/19/2014  . Essential hypertension 02/19/2014  . Obesity (BMI 30-39.9) 04/03/2013  . CAD S/P percutaneous coronary angioplasty   . Dyslipidemia, goal LDL below 70   . History of acute anterior wall MI 08/14/2004    Palliative Care Assessment & Plan   Patient Profile:  64 y.o. female  with past medical history of metastatic gynecologic cancer with carcinomatosis and ureteral obstruction status post stent placement admitted on 04/13/2020 with small bowel obstruction, carcinomatosis ileus, pleural effusion and malignant ascites, severe protein calorie malnutrition patient placed on TPN, acute kidney injury, underlying stage III CKD.  Assessment: Metastatic cervical cancer with peritoneal carcinomatosis. SBO carcinomatous ileus. Malignant ascites s/p paracentesis. Functional decline. Pleural effusion s/p thoracentesis. Severe protein calorie malnutrition.   PPS 30%.  Recommendations/Plan: - DNR/DNI -  Patient considering transition to hospice.  She wants to be home, but son with concern about home situation.   - Dr. Alvy Bimler has been in discussion with her regarding residential hospice.  Will plan to f/u with her and family to continue this discussion tomorrow.   Code Status:    DNR  Prognosis:   > 2 weeks if TPN discontinued.  Discharge Planning:  To Be Determined Ongoing discussions about hospice.   Care plan was discussed with  Patient today.  Also discussed with [facility] colleague   Thank you for allowing the Palliative Medicine Team to assist in the care of this patient.   Total Time 20  Prolonged Time Billed No.    Greater than 50%  of this time was spent counseling and coordinating care related to the above assessment and plan.  Micheline Rough, MD  Please contact Palliative Medicine Team phone at [phone] for questions and concerns.

## 2020-05-10 LAB — CBC WITH DIFFERENTIAL/PLATELET
Abs Immature Granulocytes: 0.05 10*3/uL (ref 0.00–0.07)
Basophils Absolute: 0 10*3/uL (ref 0.0–0.1)
Basophils Relative: 0 %
Eosinophils Absolute: 0.2 10*3/uL (ref 0.0–0.5)
Eosinophils Relative: 2 %
HCT: 28.7 % — ABNORMAL LOW (ref 36.0–46.0)
Hemoglobin: 8.3 g/dL — ABNORMAL LOW (ref 12.0–15.0)
Immature Granulocytes: 1 %
Lymphocytes Relative: 14 %
Lymphs Abs: 0.9 10*3/uL (ref 0.7–4.0)
MCH: 28.9 pg (ref 26.0–34.0)
MCHC: 28.9 g/dL — ABNORMAL LOW (ref 30.0–36.0)
MCV: 100 fL (ref 80.0–100.0)
Monocytes Absolute: 0.5 10*3/uL (ref 0.1–1.0)
Monocytes Relative: 7 %
Neutro Abs: 4.7 10*3/uL (ref 1.7–7.7)
Neutrophils Relative %: 76 %
Platelets: 162 10*3/uL (ref 150–400)
RBC: 2.87 MIL/uL — ABNORMAL LOW (ref 3.87–5.11)
RDW: 17.3 % — ABNORMAL HIGH (ref 11.5–15.5)
WBC: 6.3 10*3/uL (ref 4.0–10.5)
nRBC: 0 % (ref 0.0–0.2)

## 2020-05-10 LAB — GLUCOSE, CAPILLARY: Glucose-Capillary: 125 mg/dL — ABNORMAL HIGH (ref 70–99)

## 2020-05-10 MED ORDER — TRAVASOL 10 % IV SOLN
INTRAVENOUS | Status: DC
  Filled 2020-05-10: qty 900

## 2020-05-10 NOTE — Progress Notes (Signed)
25 Days Post-Op Procedure(s) (LRB): CYSTOSCOPY WITH RETROGRADE PYELOGRAM/URETERAL STENT PLACEMENT (Right) bladder biopsy with fulguration of bleeders (N/A)  64 year old female currently admitted for metastatic cervical cancer s/p stent placement and cycle 1 of carboplatin and taxol on 04/20/2020 with multiple issues including bowel obstruction due to carcinomatosis with TPN and G tube placement, symptomatic pleural effusions and ascites improved after thoracentesis/paracentesis with significant decline in performance status and deconditioning.  Subjective: Patient reports feeling "okay" at the moment. No dyspnea reported. No nausea or emesis.  Abdominal discomfort is intermittent. States she feels her stomach has been growling and reporting mild feelings of hunger. Family at the bedside.    Objective: Vital signs in last 24 hours: Temp:  [97.5 F (36.4 C)-97.8 F (36.6 C)] 97.8 F (36.6 C) (09/08 1451) Pulse Rate:  [64-97] 97 (09/08 1451) Resp:  [16-18] 16 (09/08 1451) BP: (96-105)/(51-64) 105/64 (09/08 1451) SpO2:  [98 %-100 %] 100 % (09/08 1451) Last BM Date: 05/09/20  Intake/Output from previous day: 09/07 0701 - 09/08 0700 In: 2060.2 [I.V.:2060.2] Out: 1700 [Urine:750; Drains:950]  Physical Examination: General: alert, cooperative and no distress Resp: Clear breath sounds, no wheezing Cardio: regular rate and rhythm, S1, S2 normal, no murmur, click, rub or gallop GI: Hypoactive bowel sounds, moderately distended, tympanic, rectal tube to straight drain with liquid stool in the bag Extremities: generalized edema noted, heels floated, mepilex on the heels  Purewick in place. G tube to suction.  Labs: WBC/Hgb/Hct/Plts:  6.3/8.3/28.7/162 (09/08 0331)    Assessment: 64 y.o. s/p Procedure(s): CYSTOSCOPY WITH RETROGRADE PYELOGRAM/URETERAL STENT PLACEMENT. S/P cycle 1 of carboplatin and taxol on 04/20/2020 with significant decline in performance status with moderate deconditioning  making her not a candidate for additional chemotherapy (received cycle 1).  Pain:  Pain is well-controlled on PRN medications.  Heme: Hgb 8.3 and Hct 28.7 this am-continue to monitor. PLT 162- recovered after recent chemotherapy.  ID: WBC count 6.3 this am with ANC 4.7-improved given recent chemotherapy.   CV: BP and HR stable. Continue to monitor with ordered vital signs.   GI:  Tolerating po: no, NPO-Palliative G tube to suction. TPN running through PICC line at 75 cc/hr.   GU: S/P stent placement. Creatinine 1.40 this am. 750 cc of urine documented for 9/7-9/8  FEN: No critical values noted.  Prophylaxis: SCDs.   Plan: Palliative Care following.  Dr. Berline Lopes to see patient later today Continue plan of care per Hospitalist Team   LOS: 26 days    Dorothyann Gibbs 05/10/2020, 3:10 PM

## 2020-05-10 NOTE — Progress Notes (Signed)
Daily Progress Note   Patient Name: Eileen Burgess       Date: 05/10/2020 DOB: 09-05-1955  Age: 64 y.o. MRN#: [id number] Attending Physician: Nita Sells, MD Primary Care Physician: Patient, No Pcp Per Admit Date: 04/13/2020  Reason for Consultation/Follow-up: Establishing goals of care  Subjective: I saw and examined Eileen Burgess this AM.  We discussed again regarding recommendations from Dr. Alvy Bimler and options for care moving forward.  This included discussion of options of home hospice and residential hospice.  She expressed understanding that time is likely short, and she understands that TPN would be stopped with her enrollment in hospice.  She asked me to call and discuss with her brother Eileen Burgess, then requested if Eileen Burgess and his wife, Eileen Burgess, would be allowed to come and talk through options with her.  She reports needing "someone who I can just be real and open with to help me decide what to do."  I called and discussed her clinical course and options for hospice care with her brother, Eileen Burgess.  He is concerned about her home being suitable (they just discovered black mold and AC unit is out right now) and if her family will be able to meet her care and symptom management needs at home (she is currently unable to take medications by mouth).  He believes that residential hospice is likely best plan moving forward.  He and his wife are going to come and discuss this with her today.  Length of Stay: 26  Current Medications: Scheduled Meds:  . alteplase  2 mg Intracatheter Once  . Chlorhexidine Gluconate Cloth  6 each Topical Daily  . metoprolol tartrate  5 mg Intravenous Q8H  . pantoprazole (PROTONIX) IV  40 mg Intravenous Q24H  . sodium chloride flush  10-40 mL Intracatheter  Q12H    Continuous Infusions: . TPN ADULT (ION) 75 mL/hr at 05/09/20 1848  . TPN ADULT (ION)      PRN Meds: acetaminophen **OR** acetaminophen, alteplase, alum & mag hydroxide-simeth, guaiFENesin-dextromethorphan, heparin lock flush, heparin lock flush, ipratropium-albuterol, LORazepam, nitroGLYCERIN, ondansetron **OR** ondansetron (ZOFRAN) IV, phenol, prochlorperazine, simethicone, sodium chloride flush, sodium chloride flush, sodium chloride flush  Physical Exam      General: Alert, awake, in no acute distress.  HEENT: No bruits, no goiter, no JVD Heart: Regular rate and rhythm. No  murmur appreciated. Lungs: Good air movement, clear Abdomen: Soft, nontender, nondistended, positive bowel sounds. + PEG Ext: + edema Skin: Warm and dry  Vital Signs: BP 98/60 (BP Location: Left Arm)   Pulse 92   Temp 97.8 F (36.6 C)   Resp 18   Ht 5\' 4"  (1.626 m)   Wt 105 kg   LMP  (LMP Unknown)   SpO2 99%   BMI 39.73 kg/m  SpO2: SpO2: 99 % O2 Device: O2 Device: Nasal Cannula O2 Flow Rate: O2 Flow Rate (L/min): 2 L/min  Intake/output summary:   Intake/Output Summary (Last 24 hours) at 05/10/2020 1442 Last data filed at 05/10/2020 0700 Gross per 24 hour  Intake 2060.18 ml  Output 1700 ml  Net 360.18 ml   LBM: Last BM Date: 05/09/20 Baseline Weight: Weight: 98 kg Most recent weight: Weight: 105 kg       Palliative Assessment/Data:      Patient Active Problem List   Diagnosis Date Noted  . Goals of care, counseling/discussion 04/18/2020  . Cervical cancer ([place]) 04/17/2020  . Hyponatremia syndrome 04/14/2020  . UTI (urinary tract infection) 04/13/2020  . AKI (acute kidney injury) ([place]) 04/13/2020  . Hyponatremia 04/13/2020  . Orthostatic hypotension 04/13/2020  . Statin intolerance 02/19/2014  . Anxiety - very rare "panick attacks" 02/19/2014  . Essential hypertension 02/19/2014  . Obesity (BMI 30-39.9) 04/03/2013  . CAD S/P percutaneous coronary angioplasty   .  Dyslipidemia, goal LDL below 70   . History of acute anterior wall MI 08/14/2004    Palliative Care Assessment & Plan   Patient Profile:  64 y.o. female  with past medical history of metastatic gynecologic cancer with carcinomatosis and ureteral obstruction status post stent placement admitted on 04/13/2020 with small bowel obstruction, carcinomatosis ileus, pleural effusion and malignant ascites, severe protein calorie malnutrition patient placed on TPN, acute kidney injury, underlying stage III CKD.  Assessment: Metastatic cervical cancer with peritoneal carcinomatosis. SBO carcinomatous ileus. Malignant ascites s/p paracentesis. Functional decline. Pleural effusion s/p thoracentesis. Severe protein calorie malnutrition.   PPS 30%.  Recommendations/Plan: - DNR/DNI - Discussed again regarding potential options for hospice.  She reports that Dr. Alvy Bimler has been discussing this with her and she understands the situation.  We discussed that plan for hospice would be with understanding that TPN would be discontinued.  She wants to be at home, but her brother does not think it is a safe environment (black mold) and he worried about symptom management as she is not able to take in anything by mouth. -  I spoke with her brother on the phone and he believes that residential hospice is best option moving forward.  Eileen Burgess requested that her bother Eileen Burgess) and sister in law Eileen Burgess) be able to come and talk through the options with her.  I believe this is reasonable as she is struggling with decision on hospice care and have therefore requested one time exemption that they both be allowed to come to discuss options for hospice care with her.     Code Status:    DNR  Prognosis:   > 2 weeks if TPN discontinued.  Discharge Planning:  To Be Determined Ongoing discussions about hospice.   Care plan was discussed with  Patient today.  Also discussed with [facility] colleague   Thank you for  allowing the Palliative Medicine Team to assist in the care of this patient.  Start time: 1200 End time 1255  Total Time 55 Prolonged Time Billed No.  Greater than 50%  of this time was spent counseling and coordinating care related to the above assessment and plan.   Micheline Rough, MD  Please contact Palliative Medicine Team phone at [phone] for questions and concerns.

## 2020-05-10 NOTE — Progress Notes (Signed)
PROGRESS NOTE  Eileen Burgess  BHA:[id number] DOB: 1956/02/09 DOA: 04/13/2020 PCP: Patient, No Pcp Per   Brief Narrative: 64 y.o.female with PMH of CAD s/p LAD angioplasty, HTN, HLD, remote tobacco use, GERD who presented to [facility] ED 8/12 for 3 weeks of episodic diarrhea, decreased PO intake, dysuria, malaise and fatigue.  CT done 8/13 showed a pelvic mass that was concerning for malignancy with evidence of possible metastatic disease.  Ureteral stent was placed for R sided obstruction.   GYN-ONC consulted 8/16 and felt that based on patient exam that patient likely had advanced cervical cancer. Biopsies were taken of bladder and cervix and both came back as adenocarcinoma. CA 125 also found to be elevated.   Medical oncology has been following and patient has received one cycle carboplatin and paclitaxel 8/19.  Had protracted ileus but NG tube was discontinued 9/4  Patient also developed significant ascites for which paracentesis has been performed 8/31. Thoracentesis was performed 9/1.  IR post venting gastrostomy which was performed on 05/04/2020.  Multidisciplinary team including palliative care gynecologic oncology and hospitalist have been working with patient to figure out next steps which may include hospice Patient voices preference for [facility] hospice placement if possible  Assessment & Plan: Principal Problem:   Cervical cancer ([place]) Active Problems:   Essential hypertension   UTI (urinary tract infection)   AKI (acute kidney injury) ([place])   Hyponatremia   Orthostatic hypotension   Hyponatremia syndrome  Metastatic cervical cancer with peritoneal carcinomatosis: New diagnosis.  - Oncology following, s/p cycle 1 of carboplatin and paclitaxel on 04/20/2020. Patient in process of deciding with family members next steps including residential hospice  SBO, carcinomatous ileus:  -NG tube discontinued by palliative team on 05/06/2020.  Continues to have nausea vomiting -  continue as needed antiemetics, analgesics -Status post venting gastrostomy on 9/2 by IR. - TPN started through PICC, will continue for now but likely will discontinue if she declares herself as hospice  Malignant ascites: s/p paracentesis 3.7L on 8/31 - Cytology positive for adenocarcinoma.  Pleural effusion: s/p right thoracentesis 1.1L on 9/1 Cytology w did not appear to be consistent with infection by light's criteria  Severe protein calorie malnutrition: With multifactorial third-spacing of fluids worsened by hypoalbuminemia.  - TPN as above for now-she is not eating  Pancytopenia: Due to chemotherapy.  - Per onc, transfuse for hgb <8g/dl or platelets <10k.  CBC showsed hemoglobin 7.1.  On 05/05/2020 and she received 1 unit PRBC.   Hemoglobin trends between 8 and 9 -Continue to hold heparin with thrombocytopenia and anemia.  AKI on stage IIIa CKD: Due to prerenal azotemia, possibly ATN. Developing after ureteral stent was placed.  GFR improving.  Nephrology signed off. -Continue to hold ACE and HCTZ  -Repeat labs in the next several days however expect to worsen again  CAD: s/p PTCA. No anginal complaints.  - holding ASA with thrombocytopenia and anemia. Continue -beta-blocker in IV form with Lopressor 5 mg every 8 hours.  Hyperkalemia: Resolved  GERD - Continue PPI  Goals of care: Goals of Tx are palliative in nature, the patient is aware of this.  - Received chemotherapy for malignancy.  -Now thoroughly informed about possible course of her metastatic cancer.  Palliative care also on board and very helpful.  DNR She was given the option to to have multiple discussions with family members and exemption was allowed so that patient could have 2 family members help with medical decision making Greatly appreciate palliative care  input and care conferencing  Obesity: Estimated body mass index is 37.31 kg/m as calculated from the following:   Height as of this encounter: 5\' 4"   (1.626 m).   Weight as of this encounter: 98.6 kg.  DVT prophylaxis: SCDs Code Status: Full Family Communication: None present at bedside. Disposition Plan:  Status is: Inpatient  Remains inpatient appropriate because:Persistent severe electrolyte disturbances and Inpatient level of care appropriate due to severity of illness  Dispo: The patient is from: Home              Anticipated d/c is to: Home              Anticipated d/c date is: 1 to 2 days              Patient currently is not medically stable to d/c.  Consultants:   Oncology  GynOnc  Nephrology  General surgery  GI  Interventional radiology  Procedures:  04/15/20 Dr. Alinda Money CYSTOSCOPY WITH RETROGRADE PYELOGRAM/URETERAL STENT PLACEMENT    Cervical biopsy  Subjective: Has some nausea and abdominal pain No chest pain Has Flexi-Seal venting gastrostomy oxygen in addition to pure wick I mainly just actively listened to see if she had any specific questions or concerns She tells me she really wants to go to [facility] hospice if they finally decide on the same  Objective: Vitals:   05/09/20 2134 05/10/20 0518 05/10/20 1341 05/10/20 1451  BP: (!) 96/58 98/60  105/64  Pulse: 64 92  97  Resp:  18  16  Temp:  97.8 F (36.6 C)  97.8 F (36.6 C)  TempSrc:    Oral  SpO2:  99%  100%  Weight:   98.6 kg   Height:        Intake/Output Summary (Last 24 hours) at 05/10/2020 1837 Last data filed at 05/10/2020 1812 Gross per 24 hour  Intake 2685.33 ml  Output 3300 ml  Net -614.67 ml   Filed Weights   04/24/20 0830 05/01/20 0838 05/10/20 1341  Weight: 98.2 kg 105 kg 98.6 kg    General exam: Comfortable chronically unwell appearing Respiratory system: Clear to auscultation anteriorly no added sound Cardiovascular system: S1 & S2 heard, RRR.  Significantly swollen Gastrointestinal system: Abdomen is distended, shifting dullness is present cannot appreciate organomegaly she does have a venting gastrostomy on the  left side Central nervous system: Alert and oriented. No focal neurological deficits. Extremities: Symmetric 5 x 5 power. Skin: No rashes, lesions or ulcers.  Psychiatry: Judgement and insight appear normal. Mood & affect appropriate.    Data Reviewed: I have personally reviewed following labs and imaging studies  CBC: Recent Labs  Lab 05/06/20 0850 05/07/20 0335 05/08/20 0415 05/09/20 0355 05/10/20 0331  WBC 11.1* 8.3 7.8 8.5 6.3  NEUTROABS 9.4* 6.6 6.1 6.4 4.7  HGB 9.2* 8.8* 9.1* 9.6* 8.3*  HCT 28.8* 27.8* 29.4* 31.1* 28.7*  MCV 87.0 88.3 90.2 89.4 100.0  PLT 143* 139* 170 184 242   Basic Metabolic Panel: Recent Labs  Lab 05/04/20 0348 05/04/20 0348 05/05/20 0331 05/06/20 0345 05/07/20 0335 05/08/20 0415 05/09/20 0355  NA 142   < > 146* 147* 148* 146* 143  K 3.6   < > 3.7 3.8 3.6 4.3 4.7  CL 106   < > 106 106 110 111 111  CO2 26   < > 29 29 28 25 23   GLUCOSE 125*   < > 132* 134* 124* 129* 134*  BUN 96*   < >  85* 62* 64* 64* 74*  CREATININE 1.78*   < > 1.41* 1.34* 1.42* 1.31* 1.40*  CALCIUM 8.4*   < > 8.7* 8.8* 8.5* 8.1* 8.3*  MG 2.4  --  2.4 2.3 2.4 2.4  --   PHOS 3.3   < > 2.7 3.7 4.3 3.4 3.2   < > = values in this interval not displayed.   GFR: Estimated Creatinine Clearance: 45.1 mL/min (A) (by C-G formula based on SCr of 1.4 mg/dL (H)). Liver Function Tests: Recent Labs  Lab 05/04/20 0348 05/08/20 0415  AST 21 29  ALT 17 27  ALKPHOS 53 80  BILITOT 0.7 0.6  PROT 5.1* 5.6*  ALBUMIN 2.3* 2.1*   Coagulation Profile: Recent Labs  Lab 05/04/20 0348  INR 1.2   CBG: Recent Labs  Lab 05/08/20 2112 05/09/20 0017 05/09/20 0745 05/09/20 2352 05/10/20 0731  GLUCAP 117* 131* 116* 108* 125*   Urine analysis:    Component Value Date/Time   COLORURINE AMBER (A) 05/02/2020 1830   APPEARANCEUR CLOUDY (A) 05/02/2020 1830   LABSPEC 1.015 05/02/2020 1830   PHURINE 5.0 05/02/2020 1830   GLUCOSEU NEGATIVE 05/02/2020 1830   HGBUR LARGE (A) 05/02/2020  1830   [place] 05/02/2020 1830   [place] 05/02/2020 1830   PROTEINUR 30 (A) 05/02/2020 1830   NITRITE NEGATIVE 05/02/2020 1830   LEUKOCYTESUR SMALL (A) 05/02/2020 1830   Recent Results (from the past 240 hour(s))  Body fluid culture     Status: None   Collection Time: 05/03/20  1:30 PM   Specimen: PATH Cytology Pleural fluid  Result Value Ref Range Status   Specimen Description   Final    PLEURAL RIGHT Performed at [facility], [place] [address] [place], [place] [address]    Special Requests   Final    NONE Performed at [facility], [place] [address] [place], [place] [address]    Gram Stain   Final    RARE WBC PRESENT, PREDOMINANTLY MONONUCLEAR NO ORGANISMS SEEN    Culture   Final    NO GROWTH 3 DAYS Performed at [facility], [place] [address], [place], [place] [address]    Report Status 05/06/2020 FINAL  Final      Radiology Studies: No results found.  Scheduled Meds: . alteplase  2 mg Intracatheter Once  . Chlorhexidine Gluconate Cloth  6 each Topical Daily  . metoprolol tartrate  5 mg Intravenous Q8H  . pantoprazole (PROTONIX) IV  40 mg Intravenous Q24H  . sodium chloride flush  10-40 mL Intracatheter Q12H   Continuous Infusions: . TPN ADULT (ION) 75 mL/hr at 05/10/20 1714     LOS: 26 days   Time spent: 30 minutes.  Nita Sells, MD [facility] [email or web address] 05/10/2020, 6:37 PM

## 2020-05-10 NOTE — Progress Notes (Signed)
Nutrition Follow-up  RD working remotely.  DOCUMENTATION CODES:   Obesity unspecified  INTERVENTION:  - continue TPN per Pharmacist. - weigh patient today.  - will continue to monitor POC/GOC.   NUTRITION DIAGNOSIS:   Increased nutrient needs related to cancer and cancer related treatments as evidenced by estimated needs. -ongoing  GOAL:   Patient will meet greater than or equal to 90% of their needs -met with TPN regimen  MONITOR:   Labs, Weight trends, I & O's, Other (Comment) (TPN regimen)  ASSESSMENT:   64 year old female currently admitted for metastatic cervical cancer s/p stent placement and cycle 1 of carboplatin and taxol on 04/20/2020 with multiple issues including bowel obstruction due to carcinomatosis with TPN and NG tube placement, symptomatic pleural effusions, increasing creatinine. PMH includes  CAD status post angioplasty to the LAD, HTN, GERD.  She has been NPO since 8/30. Double lumen PICC placed in R basilic on 7/86. She is receiving custom TPN at goal rate of 75 ml/hr which is providing 1821 kcal, 90 grams protein to meet 100% estimated needs. G-tube placed on 9/2 for venting/draining purposes (not for nutrition).   She has not been weighed since 8/30.   Palliative Care is following and she is currently DNR/DNI. Plan per Palliative's note yesterday is for follow-up with patient and family today.     Labs reviewed; CBG: 125 mg/dl, BUN: 74 mg/dl, creatinine: 1.4 mg/dl, Ca: 8.3 mg/dl, GFR: 39 ml/min. Medications reviewed.    Diet Order:   Diet Order            Diet NPO time specified Except for: Ice Chips, Sips with Meds  Diet effective now                 EDUCATION NEEDS:   Not appropriate for education at this time  Skin:  Skin Assessment: Reviewed RN Assessment  Last BM:  9/7  Height:   Ht Readings from Last 1 Encounters:  04/13/20 '5\' 4"'  (1.626 m)    Weight:   Wt Readings from Last 1 Encounters:  05/01/20 105 kg     Estimated  Nutritional Needs:  Kcal:  1700-1900 Protein:  80-90g Fluid:  1.8L/day     Jarome Matin, MS, RD, LDN, CNSC Inpatient Clinical Dietitian RD pager # available in [facility]  After hours/weekend pager # available in [facility]

## 2020-05-10 NOTE — Progress Notes (Signed)
PHARMACY - TOTAL PARENTERAL NUTRITION CONSULT NOTE   Indication: Prolonged ileus  Patient Measurements: Height: 5\' 4"  (162.6 cm) Weight: 105 kg (231 lb 7.7 oz) IBW/kg (Calculated) : 54.7 TPN AdjBW (KG): 65.5 Body mass index is 39.73 kg/m. Usual Weight: 98 kg  Assessment: 64 y/o F found to have metastatic gynecologic cancer with carcinomatosis and ureteral obstruction s/p stent placement with minimal oral intake for over a week, possible ileus. Pharmacy consulted to manage TPN.   Glucose / Insulin: No history of DM. - cbgs  (goal <150): wnl DC'd CBGs & SSI 9/7 Electrolytes: no labs today Renal: SCr 1.4, BUN 74 (up some) -  Nephrology signed off 9/3 LFTs / TGs: LFTs WNL; TG 141 (8/30), 142 (9/6) Prealbumin / albumin: 8.8 (8/30)/ 2.3 (9/2); 9.4 (9/6)/ 2.1 (9/6) Intake / Output; MIVF: - MIVF: none, NS stopped by MD on 8/29 GI Imaging:  -8/21 Abd x-ray: ileus vs obstruction -8/26 Abd x-ray: possible ileus, unable to exclude distal colonic obstruction -8/28 Abd x-ray: Ureteral stent is stable.  Continued air-filled prominent colon may represent ileus -8/30 Abd x-ray: Mildly dilated small bowel loops in right abdomen appear slightly increased. Stable moderate gaseous distention of the colon. Findings are compatible with persistent adynamic ileus.  -8/30 CT abd: Moderate volume intra-abdominal ascites. Suspected ileus, incompletely evaluated. Moderate sized bilateral pleural effusions with associated bibasilar atelectasis, right greater than left.  -8/31 Abd x-ray: Findings consistent with an underlying partial small bowel obstruction or ileus, similar to prior examination.  Surgeries / Procedures:  -8/14 ureteral stent placement  -8/31 paracentesis  -9/1 thoracentesis -9/2: G-tube placement Significant Events: -8/26: N/V, NGT placed. CLD > NPO  Central access: PICC 8/18 TPN start date: 8/23  Nutritional Goals (per RD recommendation on 9/2):  kCal: 1700-1900, Protein: 80-90g,  Fluid: 1.8 L/d  Goal TPN rate is 75 mL/hr (provides 90 g of protein and 1821 kcals per day); meeting >100% of patient needs  Note:  Intralipid product ordered in TPN from 8/23-8/28.  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 Pharmacist was informed on 8/28 that SMOFlipids had been used to prepare the TPN from 8/23-8/27 despite the Intralipid orders and that Intralipid was not available for the TPN on 8/28. The order was changed from Intralipid to SMOFlipids. Will continue with SMOFlipids at this time, as it appears she has been tolerating for several days.   Current Nutrition:  TPN, NPO  Plan:   At 1800:  Continue TPN at goal rate of 75 mL/hr  Electrolytes in TPN:   standard Na 35, 30 mEq/L, Ca 2.5 mEq/L, Mag 5 mEq/L, Phos 5 mMol/L  Cl:Ac ratio: 1:1  standard MVI and trace elements in TPN  MIVF per MD (none at this time)   Monitor TPN labs on Monday/Thursday  Per Onc note: TPN to be stopped once decision made to transition to comfort care  Eudelia Bunch, Pharm.D 05/10/2020 7:21 AM

## 2020-05-11 LAB — COMPREHENSIVE METABOLIC PANEL
ALT: 23 U/L (ref 0–44)
AST: 18 U/L (ref 15–41)
Albumin: 2 g/dL — ABNORMAL LOW (ref 3.5–5.0)
Alkaline Phosphatase: 83 U/L (ref 38–126)
Anion gap: 8 (ref 5–15)
BUN: 71 mg/dL — ABNORMAL HIGH (ref 8–23)
CO2: 24 mmol/L (ref 22–32)
Calcium: 8 mg/dL — ABNORMAL LOW (ref 8.9–10.3)
Chloride: 112 mmol/L — ABNORMAL HIGH (ref 98–111)
Creatinine, Ser: 1.27 mg/dL — ABNORMAL HIGH (ref 0.44–1.00)
GFR calc Af Amer: 51 mL/min — ABNORMAL LOW (ref >60)
GFR calc non Af Amer: 44 mL/min — ABNORMAL LOW (ref >60)
Glucose, Bld: 135 mg/dL — ABNORMAL HIGH (ref 70–99)
Potassium: 4.5 mmol/L (ref 3.5–5.1)
Sodium: 144 mmol/L (ref 135–145)
Total Bilirubin: 0.6 mg/dL (ref 0.3–1.2)
Total Protein: 5.6 g/dL — ABNORMAL LOW (ref 6.5–8.1)

## 2020-05-11 LAB — CBC WITH DIFFERENTIAL/PLATELET
Abs Immature Granulocytes: 0.1 10*3/uL — ABNORMAL HIGH (ref 0.00–0.07)
Basophils Absolute: 0 10*3/uL (ref 0.0–0.1)
Basophils Relative: 1 %
Eosinophils Absolute: 0.1 10*3/uL (ref 0.0–0.5)
Eosinophils Relative: 2 %
HCT: 28.3 % — ABNORMAL LOW (ref 36.0–46.0)
Hemoglobin: 8.6 g/dL — ABNORMAL LOW (ref 12.0–15.0)
Immature Granulocytes: 2 %
Lymphocytes Relative: 15 %
Lymphs Abs: 0.9 10*3/uL (ref 0.7–4.0)
MCH: 28 pg (ref 26.0–34.0)
MCHC: 30.4 g/dL (ref 30.0–36.0)
MCV: 92.2 fL (ref 80.0–100.0)
Monocytes Absolute: 0.6 10*3/uL (ref 0.1–1.0)
Monocytes Relative: 9 %
Neutro Abs: 4.6 10*3/uL (ref 1.7–7.7)
Neutrophils Relative %: 71 %
Platelets: 204 10*3/uL (ref 150–400)
RBC: 3.07 MIL/uL — ABNORMAL LOW (ref 3.87–5.11)
RDW: 16 % — ABNORMAL HIGH (ref 11.5–15.5)
WBC: 6.3 10*3/uL (ref 4.0–10.5)
nRBC: 0 % (ref 0.0–0.2)

## 2020-05-11 LAB — PHOSPHORUS: Phosphorus: 3.8 mg/dL (ref 2.5–4.6)

## 2020-05-11 LAB — MAGNESIUM: Magnesium: 2.7 mg/dL — ABNORMAL HIGH (ref 1.7–2.4)

## 2020-05-11 MED ORDER — ONDANSETRON HCL 4 MG/2ML IJ SOLN: 4.0000 mg | Freq: Four times a day (QID) | INTRAMUSCULAR | 0 refills | Status: AC | PRN

## 2020-05-11 MED ORDER — SIMETHICONE 80 MG PO CHEW: 80.0000 mg | CHEWABLE_TABLET | Freq: Four times a day (QID) | ORAL | 0 refills | Status: AC | PRN

## 2020-05-11 MED ORDER — TRAVASOL 10 % IV SOLN
INTRAVENOUS | Status: DC
  Filled 2020-05-11: qty 900

## 2020-05-11 MED ORDER — HEPARIN SOD (PORK) LOCK FLUSH 100 UNIT/ML IV SOLN
250.0000 [IU] | INTRAVENOUS | Status: AC | PRN
  Administered 2020-05-11 (×2): 250 [IU]

## 2020-05-11 MED ORDER — LORAZEPAM 2 MG/ML IJ SOLN: 1.0000 mg | Freq: Four times a day (QID) | INTRAMUSCULAR | 0 refills | Status: AC | PRN

## 2020-05-11 MED ORDER — SODIUM CHLORIDE 0.9% FLUSH: 3.0000 mL | INTRAVENOUS | Status: AC | PRN

## 2020-05-11 MED ORDER — IPRATROPIUM-ALBUTEROL 0.5-2.5 (3) MG/3ML IN SOLN: 3.0000 mL | RESPIRATORY_TRACT | Status: AC | PRN

## 2020-05-11 MED ORDER — HYDROMORPHONE HCL 1 MG/ML IJ SOLN
0.3000 mg | INTRAMUSCULAR | Status: DC | PRN
  Administered 2020-05-11: 0.3 mg via INTRAVENOUS
  Filled 2020-05-11: qty 0.5

## 2020-05-11 NOTE — Progress Notes (Signed)
Manufacturing engineer [facility]) Hospital Liaison note.     Registration paperwork is complete. Please transport to [place].   RN please call report to [phone].   Thank you,     Farrel Gordon, RN, CCM       [place] (listed on [facility] under [facility])     [phone]

## 2020-05-11 NOTE — Discharge Summary (Signed)
Physician Discharge Summary  Eileen Burgess SKA:[id number] DOB: Oct 23, 1955 DOA: 04/13/2020  PCP: Eileen Burgess  Admit date: 04/13/2020 Discharge date: 05/11/2020  Time spent: 20 minutes  Recommendations for Outpatient Follow-up:  1. Patient going to [place] for end-of-life care and management 2. Please do not remove PICC line as cannot take oral meds 3. Will need oxygen 2 to 4 L  Discharge Diagnoses:  Principal Problem:   Cervical cancer ([place]) Active Problems:   Essential hypertension   UTI (urinary tract infection)   AKI (acute kidney injury) ([place])   Hyponatremia   Orthostatic hypotension   Hyponatremia syndrome   Discharge Condition: Very guarded  Diet recommendation: Comfort if able to tolerate ice chips however has not because of abdominal pain  Filed Weights   04/24/20 0830 05/01/20 0838 05/10/20 1341  Weight: 98.2 kg 105 kg 98.6 kg    History of present illness:  64 y.o.female with PMHofCAD s/p LAD angioplasty, HTN, HLD, remote tobacco use, GERD who presented to [facility] ED 8/12 for 3 weeks of episodic diarrhea, decreased PO intake, dysuria, malaise and fatigue.  CT done 8/13 showed a pelvic mass that was concerning for malignancy with evidence of possible metastatic disease.  Ureteral stent was placed for R sided obstruction.   GYN-ONC consulted 8/16 and felt that based on patient exam that patient likely had advanced cervical cancer. Biopsies were taken of bladder and cervix and both came back as adenocarcinoma. CA 125 also found to be elevated.   Medical oncology has been following and patient has received one cycle carboplatin and paclitaxel 8/19.  Had protracted ileus but NG tube was discontinued 9/4  Patient also developed significant ascites for which paracentesis has been performed 8/31. Thoracentesis was performed 9/1.  IR post venting gastrostomy which was performed on 05/04/2020.  Multidisciplinary team including palliative care  gynecologic oncology and hospitalist have been working with patient to figure out next steps which may include hospice Patient voices preference for [facility] hospice placement if possible  Hospital Course:  Metastatic cervical cancer with peritoneal carcinomatosis: New diagnosis.  Received in the hospital s/p cycle 1 of carboplatin and paclitaxel on 04/20/2020. Goals are now comfort  SBO, carcinomatous ileus:  -NG tube discontinued by palliative team on 05/06/2020.  Continues to have nausea vomiting - continue as needed antiemetics, analgesics and needs to have them IV -Status post venting gastrostomy on 9/2 by IR. -TPN was discontinued on discharge -Patient was discharged with PICC line  Malignant ascites: s/p paracentesis 3.7L on 8/31 - Cytology positive for adenocarcinoma.  Pleural effusion: s/p right thoracentesis 1.1L on 9/1 Cytology w did not appear to be consistent with infection by light's criteria  Severe protein calorie malnutrition: With multifactorial third-spacing of fluids worsened by hypoalbuminemia.  -Ice chips or comfort measures only-cannot take p.o. medication  Pancytopenia: Due to chemotherapy.  - Burgess onc, transfuse for hgb <8g/dl or platelets <10k.  CBC showsed hemoglobin 7.1.  On 05/05/2020 and she received 1 unit PRBC.   Hemoglobin trends between 8 and 9 -Stop checking labs  AKI on stage IIIa CKD: Due to prerenal azotemia, possibly ATN. Developing after ureteral stent was placed.  GFR improving.  Nephrology signed off. -Previously on ACE and HCTZ now discontinued  CAD: s/p PTCA. No anginal complaints.  - holding ASA with thrombocytopenia and anemia. Stop controlling blood pressure etc. given goals of palliative  Hyperkalemia: Resolved  GERD - Continue PPI if needed in the outpatient setting-I have not resumed that on discharge  to facility  Goals of care: Goals of Tx are palliative in nature, the patient is aware of this.  - Received chemotherapy  for malignancy.  -Now thoroughly informed about possible course of her metastatic cancer.  Palliative care also on board and very helpful.  DNR She was given the option to to have multiple discussions with family members and exemption was allowed so that patient could have 2 family members help with medical decision making Greatly appreciate palliative care input and care conferencing   Procedures:  Multiple (i.e. Studies not automatically included, echos, thoracentesis, etc; not x-rays)  Consultations:  Gynecology oncology  Oncology  Palliative care  Discharge Exam: Vitals:   05/10/20 2225 05/11/20 0445  BP:  (!) 93/57  Pulse: 84 98  Resp:  20  Temp:  97.9 F (36.6 C)  SpO2:  100%    General: Awake alert coherent but having some abdominal pain Was tolerating ice chips poorly Cardiovascular: S1-S2 tachycardic no murmur rub or gallop Respiratory: Clear no added sound Has a venting gastrostomy on the left side She has a PICC line on the right side and she is on oxygen  Discharge Instructions   Discharge Instructions    Diet - low sodium heart healthy   Complete by: As directed    Increase activity slowly   Complete by: As directed      Allergies as of 05/11/2020      Reactions   Shrimp [shellfish Allergy] Anaphylaxis   Crestor [rosuvastatin] Other (See Comments)   LEGS HURTING   Pravastatin Other (See Comments)   myalagia   Vytorin [ezetimibe-simvastatin] Other (See Comments)   myalgia   Wasp Venom Hives      Medication List    STOP taking these medications   Advil Dual Action 125-250 MG Tabs Generic drug: Ibuprofen-Acetaminophen   hydrochlorothiazide 12.5 MG capsule Commonly known as: MICROZIDE   lisinopril 20 MG tablet Commonly known as: ZESTRIL   metoprolol succinate 25 MG 24 hr tablet Commonly known as: TOPROL-XL   nitroGLYCERIN 0.4 MG SL tablet Commonly known as: Nitrostat   Praluent 150 MG/ML Soaj Generic drug: Alirocumab     TAKE  these medications   acetaminophen 500 MG tablet Commonly known as: TYLENOL Take 500-1,000 mg by mouth every 6 (six) hours as needed for mild pain or moderate pain.   HYDROmorphone 1 MG/ML injection Commonly known as: DILAUDID Inject 0.3 mLs (0.3 mg total) into the vein every 3 (three) hours as needed (pain or shortness of breath).   ipratropium-albuterol 0.5-2.5 (3) MG/3ML Soln Commonly known as: DUONEB Take 3 mLs by nebulization every 4 (four) hours as needed.   LORazepam 2 MG/ML injection Commonly known as: ATIVAN Inject 0.5 mLs (1 mg total) into the vein every 6 (six) hours as needed for anxiety.   ondansetron 4 MG/2ML Soln injection Commonly known as: ZOFRAN Inject 2 mLs (4 mg total) into the vein every 6 (six) hours as needed for nausea.   simethicone 80 MG chewable tablet Commonly known as: MYLICON Chew 1 tablet (80 mg total) by mouth 4 (four) times daily as needed for flatulence.   sodium chloride flush 0.9 % Soln Commonly known as: NS 3 mLs by Intracatheter route as needed.      Allergies  Allergen Reactions  . Shrimp [Shellfish Allergy] Anaphylaxis  . Crestor [Rosuvastatin] Other (See Comments)    LEGS HURTING  . Pravastatin Other (See Comments)    myalagia  . Vytorin [Ezetimibe-Simvastatin] Other (See Comments)  myalgia  . Wasp Venom Hives    Follow-up Information    Raynelle Bring, MD Follow up.   Specialty: Urology Why: Our office will call to schedule your appointment for about 8-10 weeks from now. Contact information: [place] [id number] [phone]                The results of significant diagnostics from this hospitalization (including imaging, microbiology, ancillary and laboratory) are listed below for reference.    Significant Diagnostic Studies: CT ABDOMEN WO CONTRAST  Result Date: 05/02/2020 CLINICAL DATA:  Evaluate anatomy prior to potential percutaneous gastrostomy tube placement. EXAM: CT ABDOMEN WITHOUT  CONTRAST TECHNIQUE: Multidetector CT imaging of the abdomen was performed following the standard protocol without IV contrast. COMPARISON:  CT abdomen pelvis - 04/14/2020; abdominal radiograph-05/01/2020; 04/29/2020; 04/27/2020 FINDINGS: Lack of intravenous contrast limits the ability to evaluate solid abdominal organs. Lower chest: Limited visualization of the lower thorax demonstrates small to moderate-sized bilateral pleural effusions with associated bibasilar consolidative opacities, right greater than left. Borderline cardiomegaly.  Small pericardial effusion. Hepatobiliary: There is mild nodularity of the hepatic contour suggestive of cirrhotic change. Normal noncontrast appearance of the gallbladder given degree distention. No radiopaque gallstones. Moderate volume intra-abdominal ascites. Pancreas: Normal noncontrast appearance of the pancreas. Spleen: Normal noncontrast appearance of the spleen. Adrenals/Urinary Tract: Post right-sided ureteral stent placement. The right kidney is slightly atrophic in comparison to the left. No renal stones. There is a minimal amount likely age and body habitus related bilateral perinephric stranding. No urine obstruction. Normal noncontrast appearance the bilateral adrenal glands. The urinary bladder was not imaged. Stomach/Bowel: The anterior wall of the gastric antrum is well apposed against the ventral wall of the upper abdomen without interposition of the hepatic parenchyma or transverse colon. Enteric tube tip terminates within the descending portion of the duodenum. There is moderate distension majority of the visualized loops of large and small bowel, similar to abdominal radiographs, incompletely evaluated though potentially the sequela of ileus. No pneumoperitoneum, pneumatosis or portal venous gas. Vascular/Lymphatic: Moderate amount of atherosclerotic plaque within a normal caliber abdominal aorta. No bulky retroperitoneal or mesenteric adenopathy on this  noncontrast examination. Other: Moderate to large amount of diffuse body wall anasarca, most conspicuous about the midline of the low back and bilateral flanks. Musculoskeletal: No acute or aggressive osseous abnormalities. Stigmata of dish within the lower thoracic spine. IMPRESSION: 1. Despite amenable gastric anatomy, the presence moderate volume intra-abdominal ascites renders the patient a poor candidate for gastrostomy tube placement. 2. Suspected ileus, incompletely evaluated. 3. Moderate sized bilateral pleural effusions with associated bibasilar atelectasis, right greater than left. 4. Small pericardial effusion. Further evaluation cardiac echo could be performed as indicated. 5.  Aortic Atherosclerosis (ICD10-I70.0). 6. Post right-sided ureteral stent placement with mild asymmetric atrophy of the right kidney comparison to left. No evidence of urinary obstruction. Electronically Signed   By: Sandi Mariscal M.D.   On: 05/02/2020 08:14   DG Chest 1 View  Result Date: 04/30/2020 CLINICAL DATA:  Shortness of breath and cough. EXAM: CHEST  1 VIEW COMPARISON:  Chest radiograph dated 04/28/2020. FINDINGS: The heart size and mediastinal contours are within normal limits. Opacity of the right hemithorax likely reflects a layering pleural effusion with associated atelectasis. A small left pleural effusion with associated atelectasis is noted. Mild-to-moderate bibasilar airspace opacities likely contribute. Mild diffuse bilateral interstitial opacities are noted. There is no pneumothorax. A right upper extremity peripherally inserted central venous catheter tip overlies the superior vena  cava. An enteric tube enters the stomach and terminates below the field of view. IMPRESSION: Bilateral pleural effusions with associated atelectasis and/or airspace opacities. Mild diffuse bilateral interstitial opacities may represent pulmonary edema or infection. Electronically Signed   By: Zerita Boers M.D.   On: 04/30/2020  15:04   DG Chest 1 View  Result Date: 04/20/2020 CLINICAL DATA:  Increasing short of breath EXAM: CHEST  1 VIEW COMPARISON:  04/13/2020, CT 04/15/2020 FINDINGS: Right upper extremity central venous catheter tip over the SVC. Cardiomegaly with vascular congestion and diffuse interstitial opacity consistent with edema. Moderate right-sided pleural effusion, likely layering and resulting in hazy asymmetric appearance of the right thorax. At least small left pleural effusion. Bibasilar consolidations. No pneumothorax. IMPRESSION: 1. Cardiomegaly with vascular congestion and diffuse interstitial pulmonary edema. 2. Bilateral pleural effusions, at least moderate on the right and small on the left. 3. Bibasilar consolidations Electronically Signed   By: Donavan Foil M.D.   On: 04/20/2020 22:20   DG Chest 2 View  Result Date: 04/13/2020 CLINICAL DATA:  Weakness diarrhea EXAM: CHEST - 2 VIEW COMPARISON:  07/28/2004 FINDINGS: Heart size upper normal.  Vascularity normal.  Coronary stent noted. Small bilateral pleural effusions. Mild bibasilar atelectasis/infiltrate. IMPRESSION: Interval development of mild bibasilar airspace disease and small pleural effusions. Negative for heart failure. Electronically Signed   By: Franchot Gallo M.D.   On: 04/13/2020 13:57   DG Abd 1 View  Result Date: 05/03/2020 CLINICAL DATA:  Nasogastric tube placement EXAM: ABDOMEN - 1 VIEW COMPARISON:  Portable exam 1214 hours compared 05/02/2020 FINDINGS: Tip of nasogastric tube projects over mid stomach. Upper normal heart size with pulmonary vascular congestion. Mediastinal contours normal. RIGHT arm PICC line tip projects over SVC. LEFT lower lobe atelectasis versus consolidation. Moderate RIGHT pleural effusion and basilar atelectasis. Gas in colon. RIGHT ureteral stent visualized. IMPRESSION: Tip of nasogastric tube projects over mid stomach. Persistent LEFT lower lobe atelectasis versus consolidation. Moderate RIGHT pleural effusion.  Electronically Signed   By: Lavonia Dana M.D.   On: 05/03/2020 12:48   DG Abd 1 View  Result Date: 05/01/2020 CLINICAL DATA:  Follow-up ileus EXAM: ABDOMEN - 1 VIEW COMPARISON:  04/29/2020 abdominal radiograph FINDINGS: Enteric tube terminates in the descending duodenum. Stable right nephroureteral stent. Mildly dilated small bowel loops in right abdomen appears slightly increased. Moderate gaseous distention of the colon appears similar. No evidence of pneumatosis or pneumoperitoneum. No radiopaque nephrolithiasis. IMPRESSION: 1. Enteric tube terminates in the descending duodenum. 2. Mildly dilated small bowel loops in right abdomen appear slightly increased. Stable moderate gaseous distention of the colon. Findings are compatible with persistent adynamic ileus. Electronically Signed   By: Ilona Sorrel M.D.   On: 05/01/2020 10:05   DG Abd 1 View  Result Date: 04/29/2020 CLINICAL DATA:  Follow-up ileus EXAM: ABDOMEN - 1 VIEW COMPARISON:  April 27, 2020 FINDINGS: A right ureteral stent is stable. In NG tube is identified. I suspect the distal tip may be in the proximal duodenum. Air-filled prominent colon remains. No small bowel dilatation. No other acute abnormalities. IMPRESSION: 1. Support apparatus as above. 2. Continued air-filled prominent colon may represent ileus. No other abnormalities. Electronically Signed   By: Dorise Bullion III M.D   On: 04/29/2020 09:40   DG Abd 1 View  Result Date: 04/27/2020 CLINICAL DATA:  Obstruction EXAM: ABDOMEN - 1 VIEW COMPARISON:  Portable exam 1047 hours compared 04/22/2020 FINDINGS: RIGHT ureteral stent unchanged. Gaseous distention of colon from tip of cecum through splenic  flexure. Absent gas within descending and rectosigmoid colon. Small bowel gas pattern normal. No bowel wall thickening or pathologic calcification. IMPRESSION: Mild gaseous distention of the proximal half the colon with absent gas distally, could reflect ileus but unable to exclude distal  colonic obstruction with this appearance. Electronically Signed   By: Lavonia Dana M.D.   On: 04/27/2020 11:01   CT CHEST W CONTRAST  Result Date: 04/15/2020 CLINICAL DATA:  Cancer of unknown primary, staging. EXAM: CT CHEST WITH CONTRAST TECHNIQUE: Multidetector CT imaging of the chest was performed during intravenous contrast administration. CONTRAST:  55m OMNIPAQUE IOHEXOL 300 MG/ML  SOLN COMPARISON:  None. FINDINGS: Cardiovascular: There is mild calcification of the aortic arch. Normal heart size. No pericardial effusion. A coronary artery stent is seen. Mediastinum/Nodes: No enlarged mediastinal, hilar, or axillary lymph nodes. The thyroid gland and trachea demonstrate no significant findings. There is a small hiatal hernia. Lungs/Pleura: Mild bilateral lower lobe atelectasis is seen, right slightly greater than left. Very mild slightly nodular appearing scarring and/or atelectasis is seen along the posterior aspect of the inferior left upper lobe. There is a small left pleural effusion. A moderate sized right pleural effusion is also noted. No pneumothorax is identified. Upper Abdomen: A moderate amount of abdominal free fluid is seen. There is moderate to marked severity right-sided hydronephrosis with delayed renal cortical enhancement involving the right kidney. Musculoskeletal: No chest wall abnormality. No acute or significant osseous findings. Degenerative changes seen throughout the thoracic spine. IMPRESSION: 1. Small left pleural effusion with a moderate sized right pleural effusion. 2. Mild bilateral lower lobe atelectasis, right slightly greater than left. 3. Very mild slightly nodular appearing scarring and/or atelectasis along the posterior aspect of the inferior left upper lobe. 4. Moderate amount of abdominal free fluid. 5. Moderate to marked severity right-sided hydronephrosis with delayed renal cortical enhancement involving the right kidney. This is suggestive of partial renal  obstruction. 6. Small hiatal hernia. 7. Aortic atherosclerosis. Aortic Atherosclerosis (ICD10-I70.0). Electronically Signed   By: TVirgina NorfolkM.D.   On: 04/15/2020 15:38   IR GASTROSTOMY TUBE MOD SED  Result Date: 05/05/2020 CLINICAL DATA:  Metastatic cervical carcinoma with intractable small-bowel obstruction requiring continuous nasogastric decompression. Bowel obstruction has improved with nasogastric decompression and request has been made to place a venting percutaneous gastrostomy tube so that the patient can be discharged from the hospital without a nasogastric tube. EXAM: PERCUTANEOUS GASTROSTOMY TUBE PLACEMENT ANESTHESIA/SEDATION: Formal moderate conscious sedation was not administered and the patient received 0.5 mg IV Versed for the procedure. CONTRAST:  233mOMNIPAQUE IOHEXOL 300 MG/ML  SOLN MEDICATIONS: No additional medications. FLUOROSCOPY TIME:  6 minutes and 54 seconds.  89.0 mGy. PROCEDURE: The procedure, risks, benefits, and alternatives were explained to the patient. Questions regarding the procedure were encouraged and answered. The patient understands and consents to the procedure. A time-out was performed prior to initiating the procedure. A 5-French catheter was then advanced through the patient's mouth under fluoroscopy into the esophagus and to the level of the stomach. This catheter was used to insufflate the stomach with air under fluoroscopy. The abdominal wall was prepped with Betadine in a sterile fashion, and a sterile drape was applied covering the operative field. A sterile gown and sterile gloves were used for the procedure. Local anesthesia was provided with 1% Lidocaine. A skin incision was made in the upper abdominal wall. Under fluoroscopy, an 18 gauge trocar needle was advanced into the stomach. Contrast injection was performed to confirm intraluminal position of  the needle tip. A single T tack was then deployed in the lumen of the stomach. This was brought up to  tension at the skin surface. Over a guidewire, a 9-French sheath was advanced into the lumen of the stomach. The wire was left in place as a safety wire. A loop snare device from a percutaneous gastrostomy kit was then advanced into the stomach. A floppy guide wire was advanced through the orogastric catheter under fluoroscopy in the stomach. The loop snare advanced through the percutaneous gastric access was used to snare the guide wire. This allowed withdrawal of the loop snare out of the patient's mouth by retraction of the orogastric catheter and wire. A 20-French bumper retention gastrostomy tube was looped around the snare device. It was then pulled back through the patient's mouth. The retention bumper was brought up to the anterior gastric wall. The T tack suture was cut at the skin. The exiting gastrostomy tube was cut to appropriate length and a feeding adapter applied. The catheter was injected with contrast material to confirm position and a fluoroscopic spot image saved. The tube was then flushed with saline. A dressing was applied over the gastrostomy exit site. COMPLICATIONS: None. FINDINGS: The stomach distended well with air allowing safe placement of the gastrostomy tube. After placement, the tip of the gastrostomy tube lies in the body of the stomach. IMPRESSION: Percutaneous gastrostomy with placement of a 20-French bumper retention tube in the body of the stomach. This tube can be immediately connected to wall suction as needed. Electronically Signed   By: Aletta Edouard M.D.   On: 05/05/2020 09:57   US Paracentesis  Result Date: 05/02/2020 INDICATION: Patient with history of stage IV cervical cancer/adenocarcinoma, carcinomatosis, ascites. Request made for diagnostic and therapeutic paracentesis. EXAM: ULTRASOUND GUIDED DIAGNOSTIC AND THERAPEUTIC PARACENTESIS MEDICATIONS: 1% lidocaine to skin and subcutaneous tissue COMPLICATIONS: None immediate. PROCEDURE: Informed written consent was  obtained from the patient after a discussion of the risks, benefits and alternatives to treatment. A timeout was performed prior to the initiation of the procedure. Initial ultrasound scanning demonstrates a large amount of ascites within the right lower abdominal quadrant. The right lower abdomen was prepped and draped in the usual sterile fashion. 1% lidocaine was used for local anesthesia. Following this, a 19 gauge, 10-cm, Yueh catheter was introduced. An ultrasound image was saved for documentation purposes. The paracentesis was performed. The catheter was removed and a dressing was applied. The patient tolerated the procedure well without immediate post procedural complication. FINDINGS: A total of approximately 3.7 liters of slightly hazy, amber fluid was removed. Samples were sent to the laboratory as requested by the clinical team. IMPRESSION: Successful ultrasound-guided diagnostic and therapeutic paracentesis yielding 3.7 liters of peritoneal fluid. Read by: Rowe Robert, PA-C Electronically Signed   By: Jerilynn Mages.  Shick M.D.   On: 05/02/2020 11:15   DG Chest Port 1 View  Result Date: 05/03/2020 CLINICAL DATA:  Status post right thoracentesis. EXAM: PORTABLE CHEST 1 VIEW COMPARISON:  04/30/2020 FINDINGS: Decreased right pleural effusion, no longer apparent on the right. Similar small left pleural effusion. No discernible pneumothorax on this semi erect study. Overlying left basilar opacity. Low lung volumes. Gastric tube courses below the diaphragm with the tip likely in the stomach. Similar cardiac silhouette. Right PICC with the tip projecting at the distal SVC. IMPRESSION: 1. Decreased right pleural effusion, no longer apparent. No discernible pneumothorax. 2. Similar small left pleural effusion. Overlying left basilar opacity, which may represent atelectasis, aspiration, or pneumonia.  Electronically Signed   By: Margaretha Sheffield MD   On: 05/03/2020 13:50   DG Chest Port 1 View  Result Date:  04/28/2020 CLINICAL DATA:  64 year old female with persistent cough. Query fluid overload. EXAM: PORTABLE CHEST 1 VIEW COMPARISON:  Portable chest 04/20/2020 and earlier. FINDINGS: Portable AP semi upright view at 1214 hours. Stable right PICC line. Enteric tube has been placed and courses to the stomach, tip not included. Improved bilateral ventilation, but continued basilar predominant increased interstitial opacity, and dense retrocardiac opacity. Veiling opacity on the right is compatible with a superimposed pleural effusion. No pneumothorax. Upper lung pulmonary vascularity appears more normal now. Mediastinal contours are stable and within normal limits. Dense retrocardiac opacity without air bronchograms. Paucity of bowel gas. No acute osseous abnormality identified. IMPRESSION: 1. Suspect regression of pulmonary edema since 04/20/2020, but with residual vascular congestion, small right pleural effusion, and superimposed left lower lobe collapse or consolidation. 2. Enteric tube placed into the stomach, tip not included. Electronically Signed   By: Genevie Ann M.D.   On: 04/28/2020 12:35   DG Abd 2 Views  Result Date: 05/02/2020 CLINICAL DATA:  Weakness, diarrhea EXAM: ABDOMEN - 2 VIEW COMPARISON:  05/01/2020 FINDINGS: Nasogastric tube likely extends to the second portion of the duodenum with its proximal side hole in the region of the pylorus. This is unchanged from prior examination. Dilated loops of small bowel are again seen within the mid abdomen and gas is seen within multiple nondilated loops of large bowel in keeping with changes of an underlying of partial small bowel obstruction or ileus. No gross free intraperitoneal gas. Moderate right pleural effusion again noted. Right double-J ureteral stent overlies its expected position. Pelvis excluded from view. IMPRESSION: 1. Findings consistent with an underlying partial small bowel obstruction or ileus, similar to prior examination. 2. Nasogastric tube  tip in the second portion of the duodenum. Electronically Signed   By: Fidela Salisbury MD   On: 05/02/2020 19:27   DG Abd Portable 1V  Result Date: 04/27/2020 CLINICAL DATA:  Nasogastric tube placement EXAM: PORTABLE ABDOMEN - 1 VIEW COMPARISON:  04/27/2020 FINDINGS: Interval placement of esophagogastric tube, tip and side port below the diaphragm, looped in the gastric fundus. Diffusely distended colon similar to prior examination. Partially imaged right double-J ureteral stent. IMPRESSION: 1. Interval placement of esophagogastric tube, tip and side port below the diaphragm, looped in the gastric fundus. 2. Diffusely distended colon similar to prior examination. Electronically Signed   By: Eddie Candle M.D.   On: 04/27/2020 13:33   DG Abd Portable 2V  Result Date: 04/22/2020 CLINICAL DATA:  Decreased bowel sounds EXAM: PORTABLE ABDOMEN - 2 VIEW COMPARISON:  04/21/2020 FINDINGS: Supine frontal views of the abdomen and pelvis are obtained. Right ureteral stent is stable. There is increasing gaseous distention of the colon, most compatible with ileus. No evidence of small-bowel obstruction. No masses or abnormal calcifications. IMPRESSION: 1. Progressive gaseous distention of the colon most consistent with ileus. 2. Stable right ureteral stent. Electronically Signed   By: Randa Ngo M.D.   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 Hypoactive bowel sounds. EXAM: PORTABLE ABDOMEN - 2 VIEW COMPARISON:  KUB 04/20/2020.  CT Abdomen and Pelvis 04/14/2020. FINDINGS: Upright and supine views of the abdomen and pelvis. Stable right double-J ureteral stent. Mildly to moderately gas distended large bowel again noted, with gas present  to the mid sigmoid. As before, no dilated small bowel loops. But gas is increased since 04/14/2020. No pneumoperitoneum. Stable lung bases, right pleural effusion again  evident. Stable visualized osseous structures. IMPRESSION: 1. Continued gas distended colon to the sigmoid, although no dilated small bowel to strongly suggest mechanical obstruction. Continue to favor ileus. 2. No free air. Stable right ureteral stent. Persistent right pleural effusion. Electronically Signed   By: Genevie Ann M.D.   On: 04/21/2020 13:54   DG Abd Portable 2V  Result Date: 04/20/2020 CLINICAL DATA:  Hypoactive bowel sounds. EXAM: PORTABLE ABDOMEN - 2 VIEW COMPARISON:  No prior. FINDINGS: Double-J right ureteral stent in good anatomic position. Colon is slightly distended. Cecum measures 8.9 cm in diameter. Although these findings most likely represent colonic ileus follow-up exam suggested to exclude colonic obstruction. No small bowel distention. No free air. Bibasilar atelectasis/infiltrates. Small right pleural effusion. IMPRESSION: 1.  Double-J right ureteral stent in good anatomic position. 2. Colon is slightly distended. Cecum measures 8.9 cm. Although these findings most likely represent colonic ileus, follow-up exam suggested to exclude colonic obstruction. No small bowel distention. No free air. 3.  Bibasilar atelectasis/infiltrates. Electronically Signed   By: Marcello Moores  Register   On: 04/20/2020 13:00   DG C-Arm 1-60 Min-No Report  Result Date: 04/15/2020 Fluoroscopy was utilized by the requesting physician.  No radiographic interpretation.   CT RENAL STONE STUDY  Result Date: 04/14/2020 CLINICAL DATA:  64 year old female with hematuria. EXAM: CT ABDOMEN AND PELVIS WITHOUT CONTRAST TECHNIQUE: Multidetector CT imaging of the abdomen and pelvis was performed following the standard protocol without IV contrast. COMPARISON:  None. FINDINGS: Evaluation of this exam is limited in the absence of intravenous contrast. Lower chest: Partially visualized moderate right and small left pleural effusions. There is associated partial compressive atelectasis of the lower lobes. Pneumonia is not  excluded. Clinical correlation is recommended. There is coronary vascular calcification. Partially visualized trace pericardial effusion. There is no intra-abdominal free air. Small ascites. Hepatobiliary: No focal liver abnormality is seen. No gallstones, gallbladder wall thickening, or biliary dilatation. Pancreas: Unremarkable. No pancreatic ductal dilatation or surrounding inflammatory changes. Spleen: Normal in size without focal abnormality. Adrenals/Urinary Tract: The adrenal glands unremarkable. The left kidney is unremarkable. There is a moderate right hydronephrosis with mild right hydroureter. No stone identified. There is a transition point in the distal right ureter or right UVJ. The urinary bladder is minimally distended. There is possible mild nodular thickening of the posterior bladder wall adjacent to the right UVJ (77/2). There is loss of fat plane between the anterior lower uterus/cervix and posterior bladder wall. The lower uterus/cervical region is somewhat enlarged. Findings concerning for a neoplastic process either arising from the bladder urothelium or from the cervix and invading into the posterior bladder wall with obstruction of the right UVJ or distal right ureter. Stomach/Bowel: There is a small hiatal hernia. There is no bowel obstruction. There is sigmoid diverticulosis. The appendix is not visualized with certainty. No inflammatory changes identified in the right lower quadrant. Vascular/Lymphatic: Moderate aortoiliac atherosclerotic disease. The IVC is unremarkable. No portal venous gas. There is no adenopathy. Reproductive: The uterus is anteverted. Enlargement of the lower uterus/cervical region as described above. Other: There is diffuse omental nodularity and caking consistent with metastatic disease. Musculoskeletal: Osteopenia. No acute osseous pathology. IMPRESSION: 1. Findings concerning for a neoplastic process either arising from the bladder urothelium or from the cervix  and invading into the posterior bladder wall with obstruction of the right  UVJ or distal right ureter. There is associated moderate right hydronephrosis. Sampling of the ascitic fluid may provide further diagnostic information. 2. Diffuse omental nodularity and caking consistent with metastatic disease. 3. Moderate right and small left pleural effusions with associated partial compressive atelectasis of the lower lobes. Pneumonia is not excluded. Clinical correlation is recommended. 4. Sigmoid diverticulosis. No bowel obstruction. 5. Aortic Atherosclerosis (ICD10-I70.0). Electronically Signed   By: Anner Crete M.D.   On: 04/14/2020 17:44   [place] EKG SITE RITE  Result Date: 04/18/2020 If Site Rite image not attached, placement could not be confirmed due to current cardiac rhythm.  US THORACENTESIS ASP PLEURAL SPACE W/IMG GUIDE  Result Date: 05/03/2020 INDICATION: Patient with metastatic gynecologic cancer with carcinomatosis, ascites, and now right pleural effusion. Request made for diagnostic and therapeutic right thoracentesis. EXAM: ULTRASOUND GUIDED DIAGNOSTIC AND THERAPEUTIC RIGHT THORACENTESIS MEDICATIONS: 10 mL 1% lidocaine COMPLICATIONS: None immediate. PROCEDURE: An ultrasound guided thoracentesis was thoroughly discussed with the patient and questions answered. The benefits, risks, alternatives and complications were also discussed. The patient understands and wishes to proceed with the procedure. Written consent was obtained. Ultrasound was performed to localize and mark an adequate pocket of fluid in the right chest. The area was then prepped and draped in the normal sterile fashion. 1% Lidocaine was used for local anesthesia. Under ultrasound guidance a 6 Fr Safe-T-Centesis catheter was introduced. Thoracentesis was performed. The catheter was removed and a dressing applied. FINDINGS: A total of approximately 1.1 liters of clear, yellow fluid was removed. Samples were sent to the laboratory as  requested by the clinical team. IMPRESSION: Successful ultrasound guided diagnostic and therapeutic right thoracentesis yielding 1.1 liters of pleural fluid. Read by: Brynda Greathouse PA-C Electronically Signed   By: Sandi Mariscal M.D.   On: 05/03/2020 14:49    Microbiology: Recent Results (from the past 240 hour(s))  Body fluid culture     Status: None   Collection Time: 05/03/20  1:30 PM   Specimen: PATH Cytology Pleural fluid  Result Value Ref Range Status   Specimen Description   Final    PLEURAL RIGHT Performed at [facility] [address] [place], [place] [address]    Special Requests   Final    NONE Performed at [facility], [place] [address], [place], [place] [address]    Gram Stain   Final    RARE WBC PRESENT, PREDOMINANTLY MONONUCLEAR NO ORGANISMS SEEN    Culture   Final    NO GROWTH 3 DAYS Performed at [facility], [place] [address], [place], [place] [address]    Report Status 05/06/2020 FINAL  Final     Labs: Basic Metabolic Panel: Recent Labs  Lab 05/05/20 0331 05/05/20 0331 05/06/20 0345 05/07/20 0335 05/08/20 0415 05/09/20 0355 05/11/20 0350  NA 146*   < > 147* 148* 146* 143 144  K 3.7   < > 3.8 3.6 4.3 4.7 4.5  CL 106   < > 106 110 111 111 112*  CO2 29   < > _0 GLUCOSE 132*   < > 134* 124* 129* 134* 135*  BUN 85*   < > 62* 64* 64* 74* 71*  CREATININE 1.41*   < > 1.34* 1.42* 1.31* 1.40* 1.27*  CALCIUM 8.7*   < > 8.8* 8.5* 8.1* 8.3* 8.0*  MG 2.4  --  2.3 2.4 2.4  --  2.7*  PHOS 2.7   < > 3.7 4.3  3.4 3.2 3.8   < > = values in this interval not displayed.   Liver Function Tests: Recent Labs  Lab 05/08/20 0415 05/11/20 0350  AST 29 18  ALT 27 23  ALKPHOS 80 83  BILITOT 0.6 0.6  PROT 5.6* 5.6*  ALBUMIN 2.1* 2.0*   No results for input(s): LIPASE, AMYLASE in the last 168 hours. No results for input(s): AMMONIA in the last 168 hours. CBC: Recent Labs  Lab 05/07/20 0335 05/08/20 0415  05/09/20 0355 05/10/20 0331 05/11/20 0350  WBC 8.3 7.8 8.5 6.3 6.3  NEUTROABS 6.6 6.1 6.4 4.7 4.6  HGB 8.8* 9.1* 9.6* 8.3* 8.6*  HCT 27.8* 29.4* 31.1* 28.7* 28.3*  MCV 88.3 90.2 89.4 100.0 92.2  PLT 139* 170 184 162 204   Cardiac Enzymes: No results for input(s): CKTOTAL, CKMB, CKMBINDEX, TROPONINI in the last 168 hours. BNP: BNP (last 3 results) No results for input(s): BNP in the last 8760 hours.  ProBNP (last 3 results) No results for input(s): PROBNP in the last 8760 hours.  CBG: Recent Labs  Lab 05/08/20 2112 05/09/20 0017 05/09/20 0745 05/09/20 2352 05/10/20 0731  GLUCAP 117* 131* 116* 108* 125*       Signed:  Nita Sells MD   [facility] 05/11/2020, 11:41 AM

## 2020-05-11 NOTE — Progress Notes (Addendum)
PHARMACY - TOTAL PARENTERAL NUTRITION CONSULT NOTE   Indication: Prolonged ileus  Patient Measurements: Height: 5\' 4"  (162.6 cm) Weight: 98.6 kg (217 lb 6 oz) IBW/kg (Calculated) : 54.7 TPN AdjBW (KG): 65.5 Body mass index is 37.31 kg/m. Usual Weight: 98 kg  Assessment: 64 y/o F found to have metastatic gynecologic cancer with carcinomatosis and ureteral obstruction s/p stent placement with minimal oral intake for over a week, possible ileus. Pharmacy consulted to manage TPN.   Glucose / Insulin: No history of DM. - cbgs  (goal <150): wnl DC'd CBGs & SSI 9/7 Electrolytes: elytes wnl, except elevated Cl and Mag Renal: SCr 1.27, BUN 71 -  Nephrology signed off 9/3 LFTs / TGs: LFTs WNL; TG 141 (8/30), 142 (9/6) Prealbumin / albumin: 8.8 (8/30)/ 2.3 (9/2); 9.4 (9/6)/ 2.1 (9/6) Intake / Output; MIVF: - MIVF: none, NS stopped by MD on 8/29 GI Imaging:  -8/21 Abd x-ray: ileus vs obstruction -8/26 Abd x-ray: possible ileus, unable to exclude distal colonic obstruction -8/28 Abd x-ray: Ureteral stent is stable.  Continued air-filled prominent colon may represent ileus -8/30 Abd x-ray: Mildly dilated small bowel loops in right abdomen appear slightly increased. Stable moderate gaseous distention of the colon. Findings are compatible with persistent adynamic ileus.  -8/30 CT abd: Moderate volume intra-abdominal ascites. Suspected ileus, incompletely evaluated. Moderate sized bilateral pleural effusions with associated bibasilar atelectasis, right greater than left.  -8/31 Abd x-ray: Findings consistent with an underlying partial small bowel obstruction or ileus, similar to prior examination.  Surgeries / Procedures:  -8/14 ureteral stent placement  -8/31 paracentesis  -9/1 thoracentesis -9/2: G-tube placement Significant Events: -8/26: N/V, NGT placed. CLD > NPO  Central access: PICC 8/18 TPN start date: 8/23  Nutritional Goals (per RD recommendation on 9/8): kCal: 1700-1900,  Protein: 80-90g, Fluid: 1.8 L/d  Goal TPN rate is 75 mL/hr (provides 90 g of protein and 1821 kcals per day); meeting >100% of patient needs  Note:  Intralipid product ordered in TPN from 8/23-8/28.  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 Pharmacist was informed on 8/28 that SMOFlipids had been used to prepare the TPN from 8/23-8/27 despite the Intralipid orders and that Intralipid was not available for the TPN on 8/28. The order was changed from Intralipid to SMOFlipids. Will continue with SMOFlipids at this time, as it appears she has been tolerating for several days.   Current Nutrition:  TPN, NPO  Plan:  At 1800:  Continue TPN at goal rate of 75 mL/hr  Electrolytes in TPN:   Na 35, K 30 mEq/L, Ca 2.5 mEq/L, Mag 2.5 mEq/L, Phos 5 mMol/L  Cl:Ac ratio: max Ac  standard MVI and trace elements in TPN  MIVF per MD (none at this time)   Monitor TPN labs on Monday/Thursday  Per Onc note: TPN to be stopped if decision made to transition to hospice   Eileen Burgess PharmD, BCPS Clinical Pharmacist [facility] main pharmacy [phone] 05/11/2020 8:09 AM   Addendum: Patient will transition to inpatient Hospice.  TPN orders d/c per Dr. Domingo Cocking.  Eileen Burgess PharmD, BCPS Clinical Pharmacist [facility] main pharmacy [phone] 05/11/2020 11:24 AM

## 2020-05-11 NOTE — TOC Transition Note (Signed)
Transition of Care [facility]) - CM/SW Discharge Note   Patient Details  Name: Eileen Burgess MRN: [id number] Date of Birth: 1956/01/21  [facility]) CM/SW Contact:  Ross Ludwig, LCSW Phone Number: 05/11/2020, 12:27 PM   Clinical Narrative:     Patient to be d/c'ed today to [facility].  Patient and family agreeable to plans will transport via ems RN to call report to [phone].  Patient and her brother are aware of planned discharge today.   Final next level of care: [place] Barriers to Discharge: Barriers Resolved   Patient Goals and CMS Choice Patient states their goals for this hospitalization and ongoing recovery are:: Plan to go to Hospice home in [facility]   Choice offered to / list presented to : Patient, Sibling  Discharge Placement              Patient chooses bed at: Other - please specify in the comment section below: ([facility]) Patient to be transferred to facility by: [facility] Name of family member notified: Brother [name] Patient and family notified of of transfer: 05/11/20  Discharge Plan and Services                  DME Agency: NA       HH Arranged: NA          Social Determinants of Health ([place]) Interventions     Readmission Risk Interventions Readmission Risk Prevention Plan 04/20/2020  Transportation Screening Complete  PCP or Specialist Appt within 5-7 Days Complete  Medication Review (RN CM) Complete  Some recent data might be hidden

## 2020-05-11 NOTE — Progress Notes (Signed)
Report called to Santiago Glad at [facility] [phone], EMS dispatched for 3 pm via Clinical Biomedical scientist. Await arrival. Pt prepared for transport. SRP, RN

## 2020-05-11 NOTE — Progress Notes (Signed)
Daily Progress Note   Patient Name: Eileen Burgess       Date: 05/11/2020 DOB: [birth year]  Age: 64 y.o. MRN#: [id number] Attending Physician: Nita Sells, MD Primary Care Physician: Patient, No Pcp Per Admit Date: 04/13/2020  Reason for Consultation/Follow-up: Establishing goals of care  Subjective: Received message from patient's brother left last evening.  Returned call this AM and he reports that they discussed and Eileen Burgess expressed that she would like to go to residential hospice in [place] as they have friends who report good experiences there.    I met with Eileen Burgess this AM as well.  She reports feeling "OK" other than some increased abdominal pain.  Ativan has been helpful for "tightness" related to anxiety.  Discussed addition of pain medication as needed for abdominal pain.  Reviewed options for hospice care and she reports that she is in agreement with plan to transition to residential hospice in [place] if she is accepted there.  Length of Stay: 27  Current Medications: Scheduled Meds:  . alteplase  2 mg Intracatheter Once  . Chlorhexidine Gluconate Cloth  6 each Topical Daily  . metoprolol tartrate  5 mg Intravenous Q8H  . pantoprazole (PROTONIX) IV  40 mg Intravenous Q24H  . sodium chloride flush  10-40 mL Intracatheter Q12H    Continuous Infusions: . TPN ADULT (ION) 75 mL/hr at 05/11/20 0200  . TPN ADULT (ION)      PRN Meds: acetaminophen **OR** acetaminophen, alteplase, alum & mag hydroxide-simeth, guaiFENesin-dextromethorphan, heparin lock flush, heparin lock flush, HYDROmorphone (DILAUDID) injection, ipratropium-albuterol, LORazepam, nitroGLYCERIN, ondansetron **OR** ondansetron (ZOFRAN) IV, phenol, prochlorperazine, simethicone, sodium chloride  flush, sodium chloride flush, sodium chloride flush  Physical Exam      General: Alert, awake, in no acute distress.  HEENT: No bruits, no goiter, no JVD Heart: Regular rate and rhythm. No murmur appreciated. Lungs: Good air movement, clear Abdomen: Soft, mild globally tender, mild distended, positive bowel sounds. + PEG Ext: + edema Skin: Warm and dry  Vital Signs: BP (!) 93/57 (BP Location: Left Arm)   Pulse 98   Temp 97.9 F (36.6 C) (Oral)   Resp 20   Ht _0  (1.626 m)   Wt 98.6 kg   LMP  (LMP Unknown)   SpO2 100%   BMI 37.31 kg/m  SpO2: SpO2:  100 % O2 Device: O2 Device: Nasal Cannula O2 Flow Rate: O2 Flow Rate (L/min): 2 L/min  Intake/output summary:   Intake/Output Summary (Last 24 hours) at 05/11/2020 0941 Last data filed at 05/11/2020 0546 Gross per 24 hour  Intake 1342.14 ml  Output 1950 ml  Net -607.86 ml   LBM: Last BM Date: 05/10/20 Baseline Weight: Weight: 98 kg Most recent weight: Weight: 98.6 kg       Palliative Assessment/Data:      Patient Active Problem List   Diagnosis Date Noted  . Goals of care, counseling/discussion 04/18/2020  . Cervical cancer ([place]) 04/17/2020  . Hyponatremia syndrome 04/14/2020  . UTI (urinary tract infection) 04/13/2020  . AKI (acute kidney injury) ([place]) 04/13/2020  . Hyponatremia 04/13/2020  . Orthostatic hypotension 04/13/2020  . Statin intolerance 02/19/2014  . Anxiety - very rare "panick attacks" 02/19/2014  . Essential hypertension 02/19/2014  . Obesity (BMI 30-39.9) 04/03/2013  . CAD S/P percutaneous coronary angioplasty   . Dyslipidemia, goal LDL below 70   . History of acute anterior wall MI 08/14/2004    Palliative Care Assessment & Plan   Patient Profile:  64 y.o. female  with past medical history of metastatic gynecologic cancer with carcinomatosis and ureteral obstruction status post stent placement admitted on 04/13/2020 with small bowel obstruction, carcinomatosis ileus, pleural effusion and  malignant ascites, severe protein calorie malnutrition patient placed on TPN, acute kidney injury, underlying stage III CKD.  Assessment: Metastatic cervical cancer with peritoneal carcinomatosis. SBO carcinomatous ileus. Malignant ascites s/p paracentesis. Functional decline. Pleural effusion s/p thoracentesis. Severe protein calorie malnutrition.   PPS 30%.  Recommendations/Plan: - DNR/DNI - Discussed this AM with patient and her brother via phone.  She reports that she would like to work to transition to residential hospice in [place].  Reviewed again that TPN would be discontinued at discharge.  She reported understanding this. - Reports some abdominal discomfort.  I added on low dose dilaudid as needed. - Please leave PICC line in place at time of discharge to residential hospice.  She is unable to take PO medications and will need access at facility.   Code Status:    DNR  Prognosis:  Agree with Dr. Alvy Bimler that prognosis is > 2 weeks when TPN discontinued.   Discharge Planning: - Residential hospice- Preference for [place].  Discussed with SW.  Once TPN is stopped, her prognosis is likely > 2 weeks.  She has not been able to take in anything by mouth and has increased symptoms, particularly anxiety and reports of worsened abdominal pain today.  Agree that she will be best served by placement at residential hospice for end of life care.   Care plan was discussed with  Patient and brother.  Also discussed with [facility] colleague   Thank you for allowing the Palliative Medicine Team to assist in the care of this patient.  Start time: 0900 End time 0940    Total Time 40 Prolonged Time Billed No.    Greater than 50%  of this time was spent counseling and coordinating care related to the above assessment and plan.  Micheline Rough, MD  Please contact Palliative Medicine Team phone at [phone] for questions and concerns.

## 2020-05-11 NOTE — Progress Notes (Signed)
Manufacturing engineer [facility]) Hospital Liaison note.     Received request from [place] [place] for family interest in [place]. Chart reviewed and eligibility confirmed. Spoke with family to confirm interest and explain services. Family agreeable to transfer today. TOC aware.     [facility] will notify TOC when registration paperwork has been completed to arrange transport.    RN please call report to [phone].   Thank you,     Farrel Gordon, RN, CCM       [facility] (listed on [place] under Hospice/[facility])     [phone]

## 2020-05-11 NOTE — TOC Progression Note (Signed)
Transition of Care [facility] - Progression Note    Patient Details  Name: Eileen Burgess MRN: [id number] Date of Birth: 09-Oct-1955  [facility]) CM/SW Contact  Ross Ludwig, [place] Phone Number: 05/11/2020, 10:02 AM  Clinical Narrative:    CSW was informed that patient and family have decided to pursue hospice facility placement.  Family informed palliative and attending physician they would like the hospice facility in [place].  CSW contacted [facility] and spoke to [employer] she said they will review patient's information and contact CSW back regarding availability.  CSW to continue to follow patient's progress throughout discharge planning.   Expected Discharge Plan: Home/Self Care Barriers to Discharge: Continued Medical Work up  Expected Discharge Plan and Services Expected Discharge Plan: Home/Self Care                                               Social Determinants of Health (SDOH) Interventions    Readmission Risk Interventions Readmission Risk Prevention Plan 04/20/2020  Transportation Screening Complete  PCP or Specialist Appt within 5-7 Days Complete  Medication Review (RN CM) Complete  Some recent data might be hidden

## 2020-05-24 ENCOUNTER — Telehealth: Payer: Self-pay

## 2020-05-24 NOTE — Telephone Encounter (Signed)
Called and lmomed the pt to call us back and let us know if they are still taking the praluent as I didn't see it on the med list

## 2020-05-31 ENCOUNTER — Telehealth: Payer: Self-pay | Admitting: Cardiology

## 2020-05-31 NOTE — Telephone Encounter (Signed)
lvm for patient to return call to get follow up appointment scheduled with Ellyn Hack from recall list

## 2020-06-02 DEATH — deceased

## 2020-07-13 ENCOUNTER — Encounter: Payer: Self-pay | Admitting: General Practice

## 2020-07-13 ENCOUNTER — Telehealth: Payer: Self-pay | Admitting: Cardiology

## 2020-07-13 ENCOUNTER — Encounter: Payer: Self-pay | Admitting: Cardiology

## 2020-07-13 NOTE — Telephone Encounter (Signed)
  Unable to contact, recall letter sent

## 2020-07-21 ENCOUNTER — Telehealth: Payer: Self-pay

## 2020-07-21 NOTE — Telephone Encounter (Signed)
CALLED AND LMOMED THE PT TO CALL BACK

## 2021-09-23 IMAGING — CT CT CHEST W/ CM
2 of 3 series · 15 of 36 positions shown, 18 images · IV contrast (omnipaque)
Comparison: None.

CLINICAL DATA: Cancer of unknown primary, staging.

EXAM:
CT CHEST WITH CONTRAST
TECHNIQUE: Multidetector CT imaging of the chest was performed during
intravenous contrast administration.
CONTRAST:  75mL OMNIPAQUE IOHEXOL 300 MG/ML  SOLN

[Series 2: axial st · axial · 0.70mm/px · z∈[-339,-63]mm · 12 of 163 slices shown, 15 images]
[im 13/163  mediastinal]
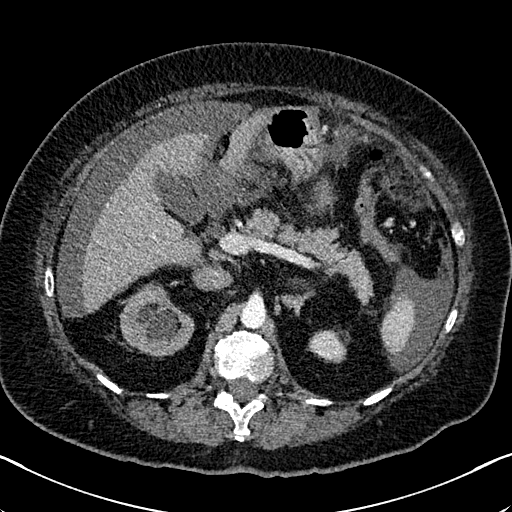
[im 13/163  lung]
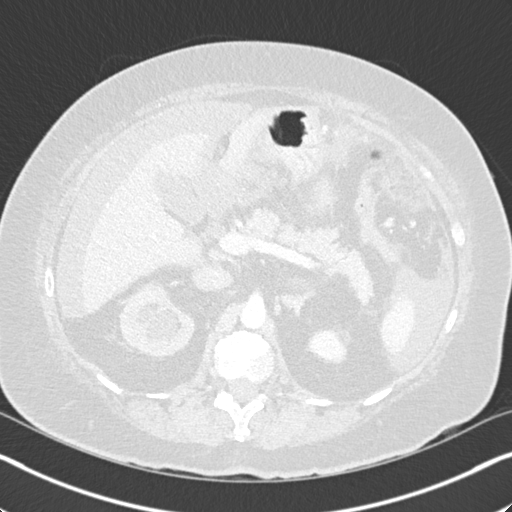
[im 25/163  lung]
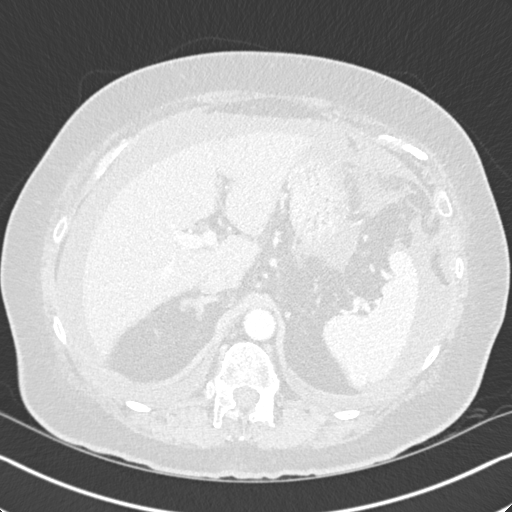
[im 37/163  lung]
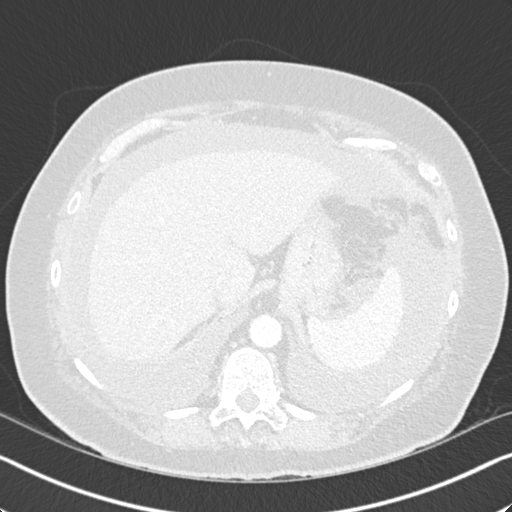
[im 49/163  lung]
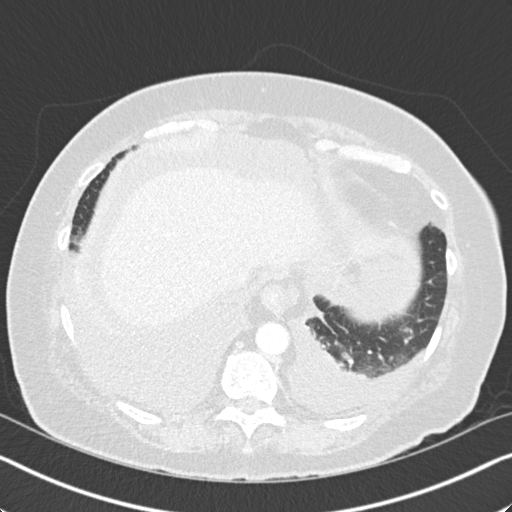
[im 61/163  mediastinal]
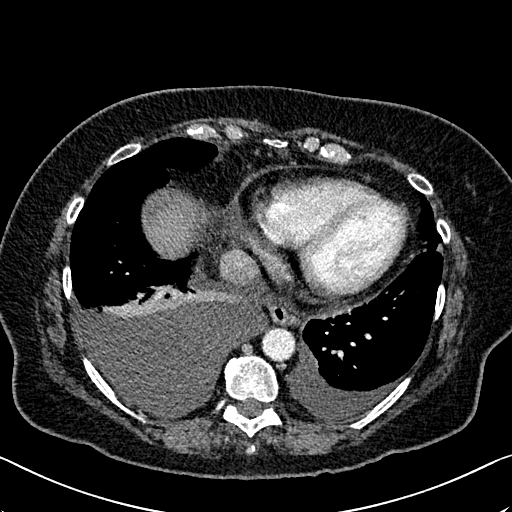
[im 61/163  lung]
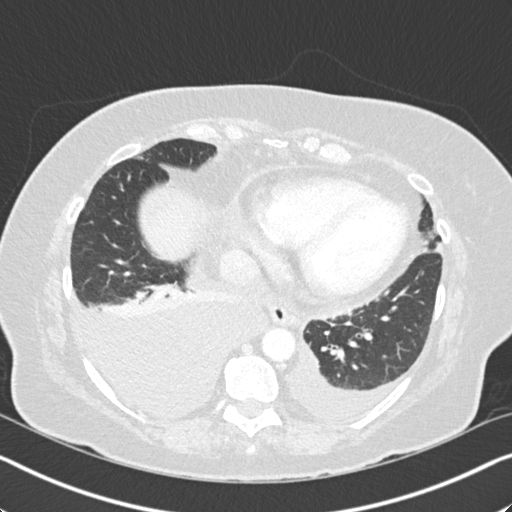
[im 73/163  lung]
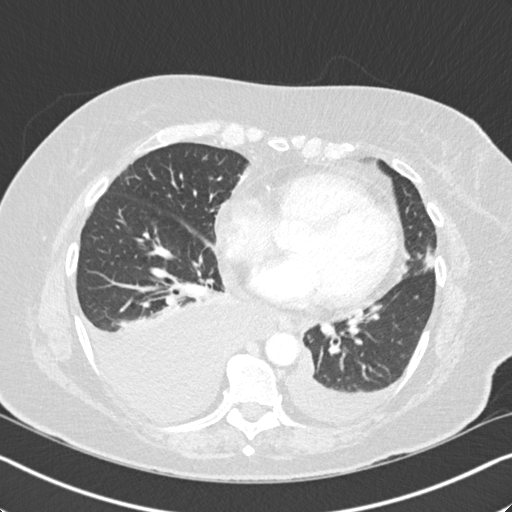
[im 91/163  lung]
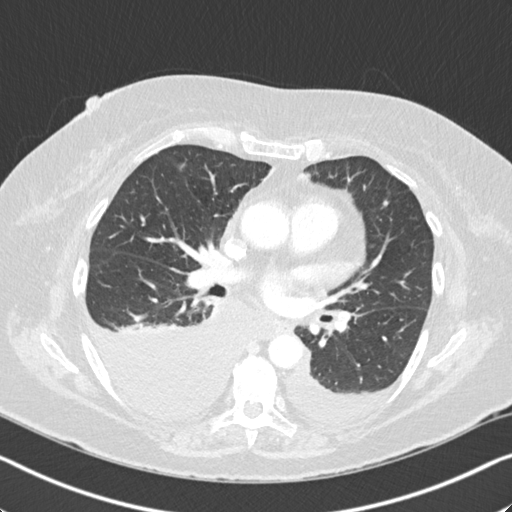
[im 103/163  lung]
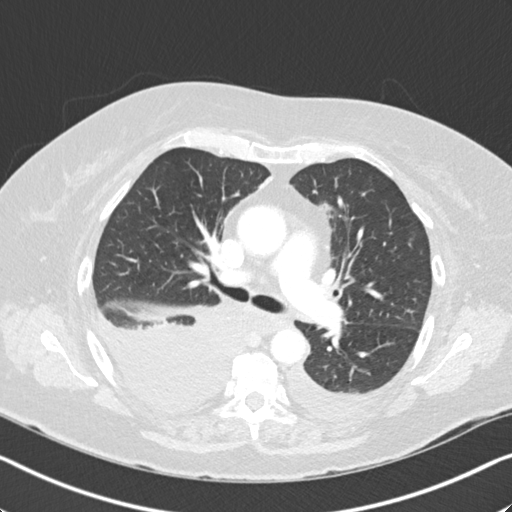
[im 115/163  mediastinal]
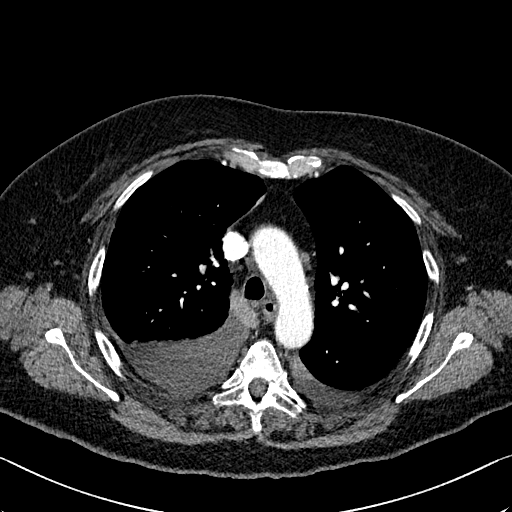
[im 115/163  lung]
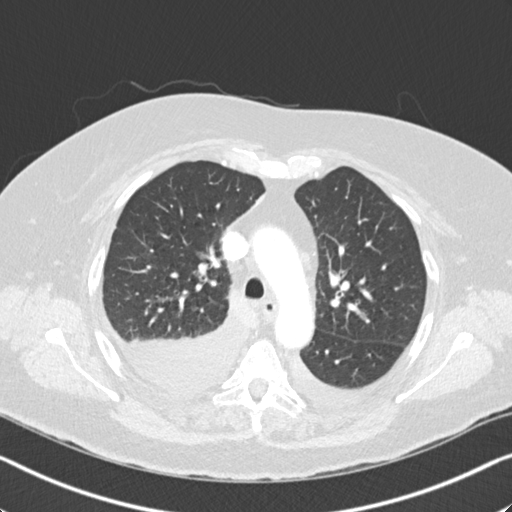
[im 127/163  lung]
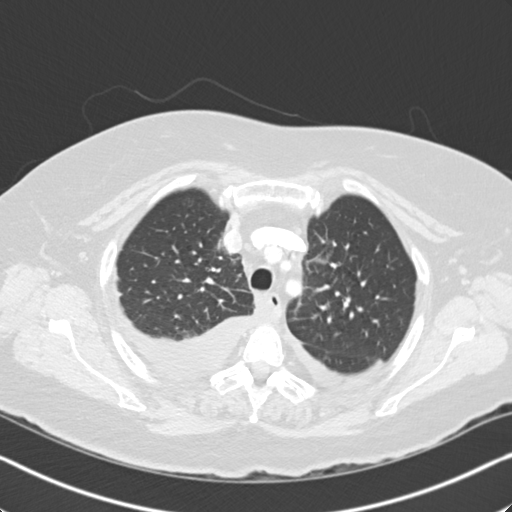
[im 139/163  lung]
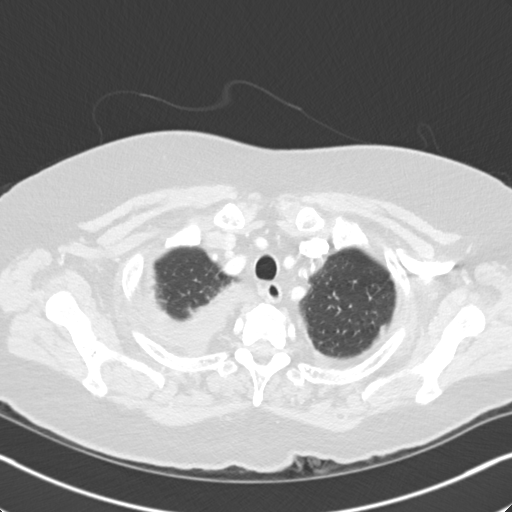
[im 151/163  lung]
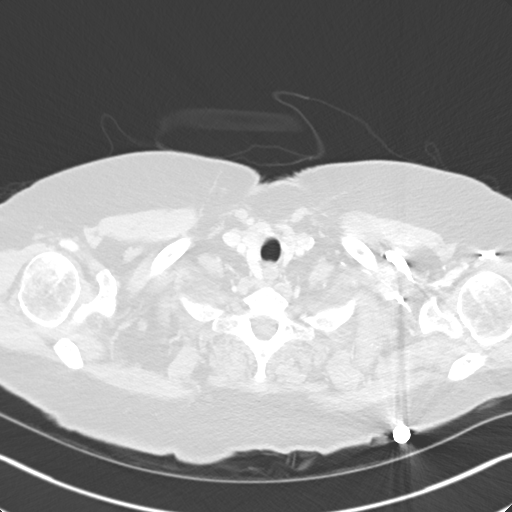

[Series 5: coronal · coronal · 0.69mm/px · 3 of 139 slices shown]
[im 28/139  lung]
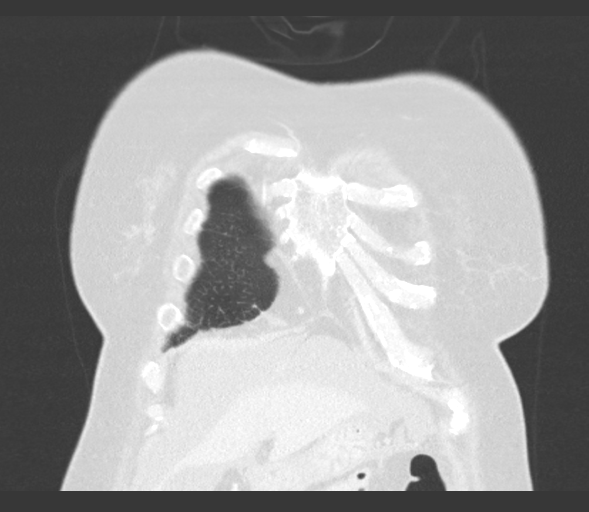
[im 56/139  lung]
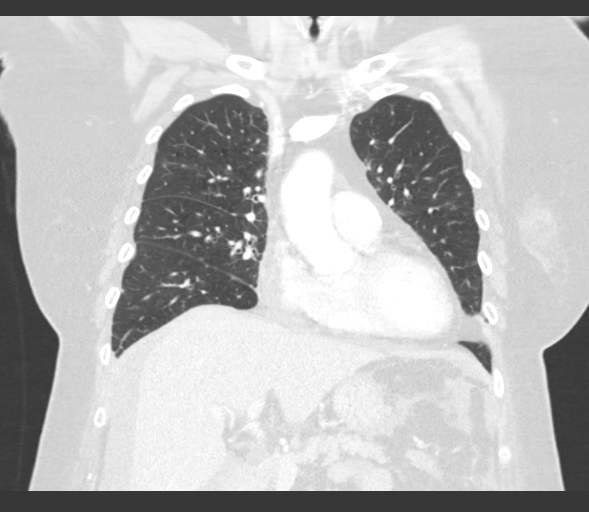
[im 83/139  lung]
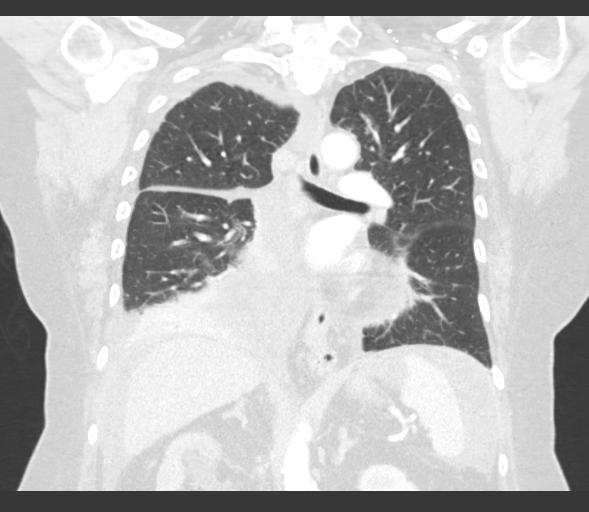

[15 of 36 positions shown; findings below may reference images not displayed]

FINDINGS: Cardiovascular: There is mild calcification of the aortic arch.
Normal heart size. No pericardial effusion. A coronary artery stent
is seen.

Mediastinum/Nodes: No enlarged mediastinal, hilar, or axillary lymph
nodes. The thyroid gland and trachea demonstrate no significant
findings. There is a small hiatal hernia.

Lungs/Pleura: Mild bilateral lower lobe atelectasis is seen, right
slightly greater than left.

Very mild slightly nodular appearing scarring and/or atelectasis is
seen along the posterior aspect of the inferior left upper lobe.

There is a small left pleural effusion. A moderate sized right
pleural effusion is also noted.

No pneumothorax is identified.

Upper Abdomen: A moderate amount of abdominal free fluid is seen.

There is moderate to marked severity right-sided hydronephrosis with
delayed renal cortical enhancement involving the right kidney.

Musculoskeletal: No chest wall abnormality. No acute or significant
osseous findings.

Degenerative changes seen throughout the thoracic spine.
IMPRESSION: 1. Small left pleural effusion with a moderate sized right pleural
effusion.
2. Mild bilateral lower lobe atelectasis, right slightly greater
than left.
3. Very mild slightly nodular appearing scarring and/or atelectasis
along the posterior aspect of the inferior left upper lobe.
4. Moderate amount of abdominal free fluid.
5. Moderate to marked severity right-sided hydronephrosis with
delayed renal cortical enhancement involving the right kidney. This
is suggestive of partial renal obstruction.
6. Small hiatal hernia.
7. Aortic atherosclerosis.

Aortic Atherosclerosis ([id number]).
